# Patient Record
Sex: Female | Born: 1945
Health system: Southern US, Community
[De-identification: ages and names within clinical notes are randomized; demographics above are authoritative.]

## PROBLEM LIST (undated history)

## (undated) DIAGNOSIS — M719 Bursopathy, unspecified: Secondary | ICD-10-CM

## (undated) DIAGNOSIS — E119 Type 2 diabetes mellitus without complications: Secondary | ICD-10-CM

## (undated) DIAGNOSIS — D126 Benign neoplasm of colon, unspecified: Secondary | ICD-10-CM

## (undated) DIAGNOSIS — T4145XA Adverse effect of unspecified anesthetic, initial encounter: Secondary | ICD-10-CM

## (undated) DIAGNOSIS — T8859XA Other complications of anesthesia, initial encounter: Secondary | ICD-10-CM

## (undated) DIAGNOSIS — K3184 Gastroparesis: Secondary | ICD-10-CM

## (undated) DIAGNOSIS — K219 Gastro-esophageal reflux disease without esophagitis: Secondary | ICD-10-CM

## (undated) DIAGNOSIS — K5790 Diverticulosis of intestine, part unspecified, without perforation or abscess without bleeding: Secondary | ICD-10-CM

## (undated) DIAGNOSIS — R739 Hyperglycemia, unspecified: Secondary | ICD-10-CM

## (undated) DIAGNOSIS — K469 Unspecified abdominal hernia without obstruction or gangrene: Secondary | ICD-10-CM

## (undated) DIAGNOSIS — M1990 Unspecified osteoarthritis, unspecified site: Secondary | ICD-10-CM

## (undated) DIAGNOSIS — R9431 Abnormal electrocardiogram [ECG] [EKG]: Secondary | ICD-10-CM

## (undated) DIAGNOSIS — K449 Diaphragmatic hernia without obstruction or gangrene: Secondary | ICD-10-CM

## (undated) DIAGNOSIS — Z9889 Other specified postprocedural states: Secondary | ICD-10-CM

## (undated) DIAGNOSIS — E785 Hyperlipidemia, unspecified: Secondary | ICD-10-CM

## (undated) HISTORY — DX: Unspecified abdominal hernia without obstruction or gangrene: K46.9

## (undated) HISTORY — DX: Gastroparesis: K31.84

## (undated) HISTORY — DX: Other specified postprocedural states: Z98.890

## (undated) HISTORY — DX: Unspecified osteoarthritis, unspecified site: M19.90

## (undated) HISTORY — DX: Diaphragmatic hernia without obstruction or gangrene: K44.9

## (undated) HISTORY — DX: Abnormal electrocardiogram (ECG) (EKG): R94.31

## (undated) HISTORY — DX: Type 2 diabetes mellitus without complications: E11.9

## (undated) HISTORY — DX: Hyperglycemia, unspecified: R73.9

## (undated) HISTORY — DX: Benign neoplasm of colon, unspecified: D12.6

## (undated) HISTORY — DX: Hyperlipidemia, unspecified: E78.5

## (undated) HISTORY — DX: Gastro-esophageal reflux disease without esophagitis: K21.9

## (undated) HISTORY — DX: Bursopathy, unspecified: M71.9

## (undated) HISTORY — DX: Diverticulosis of intestine, part unspecified, without perforation or abscess without bleeding: K57.90

## (undated) HISTORY — PX: OTHER SURGICAL HISTORY: SHX169

## (undated) HISTORY — PX: LEG SURGERY: SHX1003

## (undated) HISTORY — PX: TUBAL LIGATION: SHX77

## (undated) HISTORY — PX: TRIGGER FINGER RELEASE: SHX641

## (undated) HISTORY — PX: COLONOSCOPY W/ POLYPECTOMY: SHX1380

---

## 1997-11-17 HISTORY — PX: OTHER SURGICAL HISTORY: SHX169

## 1998-10-09 ENCOUNTER — Other Ambulatory Visit: Admission: RE | Admit: 1998-10-09 | Discharge: 1998-10-09 | Payer: Self-pay | Admitting: Obstetrics and Gynecology

## 1998-11-17 HISTORY — PX: HIATAL HERNIA REPAIR: SHX195

## 1999-05-13 ENCOUNTER — Encounter (INDEPENDENT_AMBULATORY_CARE_PROVIDER_SITE_OTHER): Payer: Self-pay | Admitting: Specialist

## 1999-05-13 ENCOUNTER — Other Ambulatory Visit: Admission: RE | Admit: 1999-05-13 | Discharge: 1999-05-13 | Payer: Self-pay | Admitting: Gastroenterology

## 1999-06-04 ENCOUNTER — Encounter: Payer: Self-pay | Admitting: Gastroenterology

## 1999-06-04 ENCOUNTER — Ambulatory Visit (HOSPITAL_COMMUNITY): Admission: RE | Admit: 1999-06-04 | Discharge: 1999-06-04 | Payer: Self-pay | Admitting: Gastroenterology

## 1999-07-17 ENCOUNTER — Inpatient Hospital Stay (HOSPITAL_COMMUNITY): Admission: EM | Admit: 1999-07-17 | Discharge: 1999-07-19 | Payer: Self-pay

## 1999-10-15 ENCOUNTER — Other Ambulatory Visit: Admission: RE | Admit: 1999-10-15 | Discharge: 1999-10-15 | Payer: Self-pay | Admitting: Obstetrics and Gynecology

## 2000-11-05 ENCOUNTER — Other Ambulatory Visit: Admission: RE | Admit: 2000-11-05 | Discharge: 2000-11-05 | Payer: Self-pay | Admitting: Obstetrics and Gynecology

## 2001-11-22 ENCOUNTER — Other Ambulatory Visit: Admission: RE | Admit: 2001-11-22 | Discharge: 2001-11-22 | Payer: Self-pay | Admitting: Obstetrics and Gynecology

## 2002-12-20 ENCOUNTER — Other Ambulatory Visit: Admission: RE | Admit: 2002-12-20 | Discharge: 2002-12-20 | Payer: Self-pay | Admitting: Obstetrics and Gynecology

## 2003-11-18 HISTORY — PX: CARDIAC CATHETERIZATION: SHX172

## 2004-01-26 ENCOUNTER — Other Ambulatory Visit: Admission: RE | Admit: 2004-01-26 | Discharge: 2004-01-26 | Payer: Self-pay | Admitting: Obstetrics and Gynecology

## 2004-03-05 ENCOUNTER — Observation Stay (HOSPITAL_COMMUNITY): Admission: EM | Admit: 2004-03-05 | Discharge: 2004-03-05 | Payer: Self-pay | Admitting: Emergency Medicine

## 2004-09-24 ENCOUNTER — Ambulatory Visit: Payer: Self-pay | Admitting: Internal Medicine

## 2004-11-05 ENCOUNTER — Ambulatory Visit: Payer: Self-pay | Admitting: Family Medicine

## 2005-01-07 ENCOUNTER — Ambulatory Visit: Payer: Self-pay | Admitting: Internal Medicine

## 2005-02-03 ENCOUNTER — Other Ambulatory Visit: Admission: RE | Admit: 2005-02-03 | Discharge: 2005-02-03 | Payer: Self-pay | Admitting: Addiction Medicine

## 2005-04-21 ENCOUNTER — Ambulatory Visit: Payer: Self-pay | Admitting: Internal Medicine

## 2005-04-29 ENCOUNTER — Ambulatory Visit: Payer: Self-pay | Admitting: Internal Medicine

## 2005-05-02 ENCOUNTER — Ambulatory Visit: Payer: Self-pay | Admitting: Internal Medicine

## 2005-05-21 ENCOUNTER — Ambulatory Visit: Payer: Self-pay | Admitting: Internal Medicine

## 2005-05-26 ENCOUNTER — Ambulatory Visit: Payer: Self-pay | Admitting: Internal Medicine

## 2005-05-27 ENCOUNTER — Ambulatory Visit: Payer: Self-pay | Admitting: Gastroenterology

## 2005-06-17 DIAGNOSIS — D126 Benign neoplasm of colon, unspecified: Secondary | ICD-10-CM

## 2005-06-17 HISTORY — DX: Benign neoplasm of colon, unspecified: D12.6

## 2005-07-08 ENCOUNTER — Encounter (INDEPENDENT_AMBULATORY_CARE_PROVIDER_SITE_OTHER): Payer: Self-pay | Admitting: *Deleted

## 2005-07-08 ENCOUNTER — Ambulatory Visit: Payer: Self-pay | Admitting: Gastroenterology

## 2005-09-05 ENCOUNTER — Ambulatory Visit: Payer: Self-pay | Admitting: Internal Medicine

## 2005-10-06 ENCOUNTER — Ambulatory Visit: Payer: Self-pay | Admitting: Internal Medicine

## 2006-03-25 ENCOUNTER — Other Ambulatory Visit: Admission: RE | Admit: 2006-03-25 | Discharge: 2006-03-25 | Payer: Self-pay | Admitting: Obstetrics and Gynecology

## 2006-04-22 ENCOUNTER — Ambulatory Visit: Payer: Self-pay | Admitting: Internal Medicine

## 2006-04-24 ENCOUNTER — Ambulatory Visit: Payer: Self-pay | Admitting: Internal Medicine

## 2006-09-22 ENCOUNTER — Ambulatory Visit: Payer: Self-pay | Admitting: Gastroenterology

## 2006-09-30 ENCOUNTER — Ambulatory Visit: Payer: Self-pay | Admitting: Internal Medicine

## 2006-10-16 ENCOUNTER — Ambulatory Visit: Payer: Self-pay | Admitting: Gastroenterology

## 2006-10-16 ENCOUNTER — Encounter (INDEPENDENT_AMBULATORY_CARE_PROVIDER_SITE_OTHER): Payer: Self-pay | Admitting: Specialist

## 2007-01-01 ENCOUNTER — Ambulatory Visit: Payer: Self-pay | Admitting: Internal Medicine

## 2007-05-05 ENCOUNTER — Encounter: Payer: Self-pay | Admitting: Internal Medicine

## 2007-05-06 ENCOUNTER — Other Ambulatory Visit: Admission: RE | Admit: 2007-05-06 | Discharge: 2007-05-06 | Payer: Self-pay | Admitting: Obstetrics and Gynecology

## 2007-05-07 DIAGNOSIS — E1169 Type 2 diabetes mellitus with other specified complication: Secondary | ICD-10-CM | POA: Insufficient documentation

## 2007-05-07 DIAGNOSIS — E785 Hyperlipidemia, unspecified: Secondary | ICD-10-CM

## 2007-05-12 ENCOUNTER — Ambulatory Visit: Payer: Self-pay | Admitting: Internal Medicine

## 2007-05-12 DIAGNOSIS — K219 Gastro-esophageal reflux disease without esophagitis: Secondary | ICD-10-CM

## 2007-05-12 DIAGNOSIS — R1319 Other dysphagia: Secondary | ICD-10-CM | POA: Insufficient documentation

## 2007-05-12 DIAGNOSIS — R1310 Dysphagia, unspecified: Secondary | ICD-10-CM

## 2007-05-12 DIAGNOSIS — R9431 Abnormal electrocardiogram [ECG] [EKG]: Secondary | ICD-10-CM | POA: Insufficient documentation

## 2007-05-13 ENCOUNTER — Ambulatory Visit: Payer: Self-pay | Admitting: Internal Medicine

## 2007-05-17 ENCOUNTER — Telehealth (INDEPENDENT_AMBULATORY_CARE_PROVIDER_SITE_OTHER): Payer: Self-pay | Admitting: *Deleted

## 2007-05-18 ENCOUNTER — Encounter (INDEPENDENT_AMBULATORY_CARE_PROVIDER_SITE_OTHER): Payer: Self-pay | Admitting: *Deleted

## 2007-05-18 ENCOUNTER — Encounter: Payer: Self-pay | Admitting: Internal Medicine

## 2007-05-18 LAB — CONVERTED CEMR LAB
ALT: 21 units/L (ref 0–35)
AST: 22 units/L (ref 0–37)
Albumin: 3.7 g/dL (ref 3.5–5.2)
Alkaline Phosphatase: 62 units/L (ref 39–117)
BUN: 18 mg/dL (ref 6–23)
Basophils Absolute: 0 10*3/uL (ref 0.0–0.1)
Basophils Relative: 0.2 % (ref 0.0–1.0)
Bilirubin, Direct: 0.1 mg/dL (ref 0.0–0.3)
CO2: 31 meq/L (ref 19–32)
Calcium: 9.2 mg/dL (ref 8.4–10.5)
Chloride: 108 meq/L (ref 96–112)
Cholesterol: 171 mg/dL (ref 0–200)
Creatinine, Ser: 0.9 mg/dL (ref 0.4–1.2)
Eosinophils Absolute: 0.2 10*3/uL (ref 0.0–0.6)
Eosinophils Relative: 2.4 % (ref 0.0–5.0)
GFR calc Af Amer: 82 mL/min
GFR calc non Af Amer: 67 mL/min
Glucose, Bld: 121 mg/dL — ABNORMAL HIGH (ref 70–99)
HCT: 40.7 % (ref 36.0–46.0)
HDL: 67.2 mg/dL (ref >39.0)
Hemoglobin: 13.8 g/dL (ref 12.0–15.0)
Hgb A1c MFr Bld: 6.5 % — ABNORMAL HIGH (ref 4.6–6.0)
LDL Cholesterol: 87 mg/dL (ref 0–99)
Lymphocytes Relative: 36.6 % (ref 12.0–46.0)
MCHC: 33.9 g/dL (ref 30.0–36.0)
MCV: 92.3 fL (ref 78.0–100.0)
Monocytes Absolute: 0.5 10*3/uL (ref 0.2–0.7)
Monocytes Relative: 7.5 % (ref 3.0–11.0)
Neutro Abs: 3.5 10*3/uL (ref 1.4–7.7)
Neutrophils Relative %: 53.3 % (ref 43.0–77.0)
Platelets: 167 10*3/uL (ref 150–400)
Potassium: 4.3 meq/L (ref 3.5–5.1)
RBC: 4.41 M/uL (ref 3.87–5.11)
RDW: 12.3 % (ref 11.5–14.6)
Sodium: 144 meq/L (ref 135–145)
TSH: 1.13 microintl units/mL (ref 0.35–5.50)
Total Bilirubin: 0.5 mg/dL (ref 0.3–1.2)
Total CHOL/HDL Ratio: 2.5
Total Protein: 6.4 g/dL (ref 6.0–8.3)
Triglycerides: 85 mg/dL (ref 0–149)
VLDL: 17 mg/dL (ref 0–40)
WBC: 6.7 10*3/uL (ref 4.5–10.5)

## 2007-06-08 ENCOUNTER — Ambulatory Visit: Payer: Self-pay | Admitting: Cardiology

## 2007-06-15 ENCOUNTER — Ambulatory Visit: Payer: Self-pay

## 2007-06-15 ENCOUNTER — Encounter: Payer: Self-pay | Admitting: Internal Medicine

## 2007-06-24 ENCOUNTER — Telehealth: Payer: Self-pay | Admitting: Internal Medicine

## 2007-07-26 ENCOUNTER — Ambulatory Visit: Payer: Self-pay | Admitting: Gastroenterology

## 2007-07-26 ENCOUNTER — Ambulatory Visit: Payer: Self-pay | Admitting: Family Medicine

## 2007-07-26 ENCOUNTER — Encounter: Payer: Self-pay | Admitting: Internal Medicine

## 2007-07-27 ENCOUNTER — Ambulatory Visit: Payer: Self-pay | Admitting: Internal Medicine

## 2007-08-19 ENCOUNTER — Ambulatory Visit: Payer: Self-pay | Admitting: Internal Medicine

## 2007-08-19 DIAGNOSIS — N951 Menopausal and female climacteric states: Secondary | ICD-10-CM

## 2007-08-31 ENCOUNTER — Ambulatory Visit: Payer: Self-pay | Admitting: Gastroenterology

## 2007-08-31 ENCOUNTER — Encounter: Payer: Self-pay | Admitting: Internal Medicine

## 2007-09-07 ENCOUNTER — Ambulatory Visit (HOSPITAL_COMMUNITY): Admission: RE | Admit: 2007-09-07 | Discharge: 2007-09-07 | Payer: Self-pay | Admitting: Gastroenterology

## 2007-09-07 ENCOUNTER — Encounter: Payer: Self-pay | Admitting: Gastroenterology

## 2007-09-17 ENCOUNTER — Encounter: Payer: Self-pay | Admitting: Internal Medicine

## 2007-09-30 ENCOUNTER — Ambulatory Visit: Payer: Self-pay | Admitting: Internal Medicine

## 2007-09-30 ENCOUNTER — Telehealth (INDEPENDENT_AMBULATORY_CARE_PROVIDER_SITE_OTHER): Payer: Self-pay | Admitting: *Deleted

## 2007-10-01 ENCOUNTER — Encounter (INDEPENDENT_AMBULATORY_CARE_PROVIDER_SITE_OTHER): Payer: Self-pay | Admitting: *Deleted

## 2007-10-06 ENCOUNTER — Encounter (INDEPENDENT_AMBULATORY_CARE_PROVIDER_SITE_OTHER): Payer: Self-pay | Admitting: *Deleted

## 2007-10-06 LAB — CONVERTED CEMR LAB
Albumin: 4 g/dL (ref 3.5–5.2)
Bilirubin Urine: NEGATIVE
Glucose, Urine, Semiquant: NEGATIVE
Hgb A1c MFr Bld: 6.2 % — ABNORMAL HIGH (ref 4.6–6.0)
Ketones, urine, test strip: NEGATIVE
Nitrite: NEGATIVE
Protein, U semiquant: NEGATIVE
Specific Gravity, Urine: 1.02
Urobilinogen, UA: NEGATIVE
pH: 6

## 2007-10-21 ENCOUNTER — Ambulatory Visit: Payer: Self-pay | Admitting: Internal Medicine

## 2007-10-22 ENCOUNTER — Encounter: Payer: Self-pay | Admitting: Internal Medicine

## 2007-10-25 ENCOUNTER — Encounter (INDEPENDENT_AMBULATORY_CARE_PROVIDER_SITE_OTHER): Payer: Self-pay | Admitting: *Deleted

## 2007-10-27 ENCOUNTER — Ambulatory Visit: Payer: Self-pay | Admitting: Gastroenterology

## 2007-11-15 DIAGNOSIS — K3184 Gastroparesis: Secondary | ICD-10-CM

## 2007-11-15 DIAGNOSIS — K5730 Diverticulosis of large intestine without perforation or abscess without bleeding: Secondary | ICD-10-CM | POA: Insufficient documentation

## 2007-11-30 ENCOUNTER — Ambulatory Visit: Payer: Self-pay | Admitting: Internal Medicine

## 2007-12-14 ENCOUNTER — Ambulatory Visit: Payer: Self-pay | Admitting: Internal Medicine

## 2007-12-15 ENCOUNTER — Ambulatory Visit: Payer: Self-pay | Admitting: Internal Medicine

## 2007-12-15 ENCOUNTER — Telehealth: Payer: Self-pay | Admitting: Internal Medicine

## 2007-12-22 ENCOUNTER — Encounter (INDEPENDENT_AMBULATORY_CARE_PROVIDER_SITE_OTHER): Payer: Self-pay | Admitting: *Deleted

## 2007-12-22 ENCOUNTER — Ambulatory Visit: Payer: Self-pay | Admitting: Internal Medicine

## 2008-02-22 ENCOUNTER — Telehealth (INDEPENDENT_AMBULATORY_CARE_PROVIDER_SITE_OTHER): Payer: Self-pay | Admitting: *Deleted

## 2008-05-09 DIAGNOSIS — Z86010 Personal history of colonic polyps: Secondary | ICD-10-CM

## 2008-05-10 ENCOUNTER — Ambulatory Visit: Payer: Self-pay | Admitting: Gastroenterology

## 2008-06-20 ENCOUNTER — Telehealth: Payer: Self-pay | Admitting: Internal Medicine

## 2008-07-18 ENCOUNTER — Telehealth: Payer: Self-pay | Admitting: Gastroenterology

## 2008-09-05 ENCOUNTER — Ambulatory Visit: Payer: Self-pay | Admitting: Internal Medicine

## 2008-09-06 ENCOUNTER — Ambulatory Visit: Payer: Self-pay | Admitting: Internal Medicine

## 2008-09-06 ENCOUNTER — Telehealth (INDEPENDENT_AMBULATORY_CARE_PROVIDER_SITE_OTHER): Payer: Self-pay | Admitting: *Deleted

## 2008-09-09 LAB — CONVERTED CEMR LAB
Alkaline Phosphatase: 55 units/L (ref 39–117)
Bilirubin, Direct: 0.2 mg/dL (ref 0.0–0.3)
GFR calc Af Amer: 109 mL/min
Glucose, Bld: 107 mg/dL — ABNORMAL HIGH (ref 70–99)
HCT: 39.4 % (ref 36.0–46.0)
HDL: 65.5 mg/dL (ref >39.0)
Hgb A1c MFr Bld: 6.2 % — ABNORMAL HIGH (ref 4.6–6.0)
LDL Cholesterol: 108 mg/dL — ABNORMAL HIGH (ref 0–99)
Lymphocytes Relative: 35.5 % (ref 12.0–46.0)
Microalb Creat Ratio: 4.4 mg/g (ref 0.0–30.0)
Monocytes Absolute: 0.5 10*3/uL (ref 0.1–1.0)
Monocytes Relative: 7.2 % (ref 3.0–12.0)
Platelets: 178 10*3/uL (ref 150–400)
Potassium: 3.8 meq/L (ref 3.5–5.1)
RDW: 12.7 % (ref 11.5–14.6)
Sodium: 143 meq/L (ref 135–145)
TSH: 1.06 microintl units/mL (ref 0.35–5.50)
Total Bilirubin: 0.7 mg/dL (ref 0.3–1.2)
Total CHOL/HDL Ratio: 2.9
Total Protein: 6.3 g/dL (ref 6.0–8.3)
Triglycerides: 93 mg/dL (ref 0–149)

## 2008-09-12 ENCOUNTER — Other Ambulatory Visit: Admission: RE | Admit: 2008-09-12 | Discharge: 2008-09-12 | Payer: Self-pay | Admitting: Obstetrics and Gynecology

## 2008-09-12 ENCOUNTER — Encounter: Payer: Self-pay | Admitting: Obstetrics and Gynecology

## 2008-09-12 ENCOUNTER — Encounter (INDEPENDENT_AMBULATORY_CARE_PROVIDER_SITE_OTHER): Payer: Self-pay | Admitting: *Deleted

## 2008-09-12 ENCOUNTER — Ambulatory Visit: Payer: Self-pay | Admitting: Obstetrics and Gynecology

## 2008-09-25 ENCOUNTER — Encounter: Payer: Self-pay | Admitting: Internal Medicine

## 2008-10-06 ENCOUNTER — Ambulatory Visit: Payer: Self-pay | Admitting: Gastroenterology

## 2008-10-20 ENCOUNTER — Encounter: Payer: Self-pay | Admitting: Gastroenterology

## 2008-10-20 ENCOUNTER — Ambulatory Visit: Payer: Self-pay | Admitting: Gastroenterology

## 2008-10-20 LAB — HM COLONOSCOPY

## 2008-10-23 ENCOUNTER — Encounter: Payer: Self-pay | Admitting: Gastroenterology

## 2008-12-04 ENCOUNTER — Ambulatory Visit: Payer: Self-pay | Admitting: Internal Medicine

## 2008-12-26 ENCOUNTER — Telehealth (INDEPENDENT_AMBULATORY_CARE_PROVIDER_SITE_OTHER): Payer: Self-pay | Admitting: *Deleted

## 2008-12-26 ENCOUNTER — Encounter: Payer: Self-pay | Admitting: Internal Medicine

## 2009-03-02 ENCOUNTER — Ambulatory Visit: Payer: Self-pay | Admitting: Internal Medicine

## 2009-04-30 ENCOUNTER — Ambulatory Visit: Payer: Self-pay | Admitting: Gastroenterology

## 2009-07-03 ENCOUNTER — Telehealth (INDEPENDENT_AMBULATORY_CARE_PROVIDER_SITE_OTHER): Payer: Self-pay | Admitting: *Deleted

## 2009-07-03 ENCOUNTER — Ambulatory Visit: Payer: Self-pay | Admitting: Internal Medicine

## 2009-07-10 ENCOUNTER — Telehealth (INDEPENDENT_AMBULATORY_CARE_PROVIDER_SITE_OTHER): Payer: Self-pay | Admitting: *Deleted

## 2009-07-10 ENCOUNTER — Ambulatory Visit: Payer: Self-pay | Admitting: Internal Medicine

## 2009-07-10 LAB — CONVERTED CEMR LAB: TSH: 0.06 microintl units/mL — ABNORMAL LOW (ref 0.35–5.50)

## 2009-07-13 ENCOUNTER — Ambulatory Visit: Payer: Self-pay | Admitting: Internal Medicine

## 2009-07-13 DIAGNOSIS — R946 Abnormal results of thyroid function studies: Secondary | ICD-10-CM

## 2009-07-16 ENCOUNTER — Telehealth: Payer: Self-pay | Admitting: Internal Medicine

## 2009-08-01 ENCOUNTER — Telehealth (INDEPENDENT_AMBULATORY_CARE_PROVIDER_SITE_OTHER): Payer: Self-pay | Admitting: *Deleted

## 2009-08-01 ENCOUNTER — Ambulatory Visit: Payer: Self-pay | Admitting: Internal Medicine

## 2009-08-06 ENCOUNTER — Telehealth: Payer: Self-pay | Admitting: Internal Medicine

## 2009-08-20 ENCOUNTER — Encounter: Payer: Self-pay | Admitting: Internal Medicine

## 2009-08-27 ENCOUNTER — Ambulatory Visit: Payer: Self-pay | Admitting: Internal Medicine

## 2009-08-27 DIAGNOSIS — H8110 Benign paroxysmal vertigo, unspecified ear: Secondary | ICD-10-CM | POA: Insufficient documentation

## 2009-09-06 ENCOUNTER — Encounter: Admission: RE | Admit: 2009-09-06 | Discharge: 2009-10-17 | Payer: Self-pay | Admitting: Internal Medicine

## 2009-09-06 ENCOUNTER — Telehealth (INDEPENDENT_AMBULATORY_CARE_PROVIDER_SITE_OTHER): Payer: Self-pay | Admitting: *Deleted

## 2009-09-07 ENCOUNTER — Telehealth (INDEPENDENT_AMBULATORY_CARE_PROVIDER_SITE_OTHER): Payer: Self-pay | Admitting: *Deleted

## 2009-09-07 ENCOUNTER — Ambulatory Visit: Payer: Self-pay | Admitting: Cardiology

## 2009-09-10 ENCOUNTER — Telehealth (INDEPENDENT_AMBULATORY_CARE_PROVIDER_SITE_OTHER): Payer: Self-pay | Admitting: *Deleted

## 2009-09-13 ENCOUNTER — Encounter: Payer: Self-pay | Admitting: Internal Medicine

## 2009-09-17 ENCOUNTER — Encounter: Payer: Self-pay | Admitting: Internal Medicine

## 2009-09-18 ENCOUNTER — Encounter: Payer: Self-pay | Admitting: Internal Medicine

## 2009-09-20 ENCOUNTER — Encounter: Payer: Self-pay | Admitting: Internal Medicine

## 2009-10-04 ENCOUNTER — Ambulatory Visit: Payer: Self-pay | Admitting: Internal Medicine

## 2009-10-08 ENCOUNTER — Ambulatory Visit: Payer: Self-pay | Admitting: Internal Medicine

## 2009-10-08 ENCOUNTER — Ambulatory Visit: Payer: Self-pay | Admitting: Family Medicine

## 2009-10-10 ENCOUNTER — Encounter: Payer: Self-pay | Admitting: Internal Medicine

## 2009-10-16 ENCOUNTER — Ambulatory Visit: Payer: Self-pay | Admitting: Internal Medicine

## 2009-10-16 DIAGNOSIS — M6740 Ganglion, unspecified site: Secondary | ICD-10-CM | POA: Insufficient documentation

## 2009-10-16 DIAGNOSIS — R7309 Other abnormal glucose: Secondary | ICD-10-CM

## 2009-10-17 ENCOUNTER — Telehealth (INDEPENDENT_AMBULATORY_CARE_PROVIDER_SITE_OTHER): Payer: Self-pay | Admitting: *Deleted

## 2009-10-17 ENCOUNTER — Ambulatory Visit: Payer: Self-pay | Admitting: Obstetrics and Gynecology

## 2009-10-17 ENCOUNTER — Other Ambulatory Visit: Admission: RE | Admit: 2009-10-17 | Discharge: 2009-10-17 | Payer: Self-pay | Admitting: Obstetrics and Gynecology

## 2009-10-22 ENCOUNTER — Encounter: Payer: Self-pay | Admitting: Internal Medicine

## 2009-10-26 ENCOUNTER — Encounter: Payer: Self-pay | Admitting: Internal Medicine

## 2009-11-02 ENCOUNTER — Encounter (INDEPENDENT_AMBULATORY_CARE_PROVIDER_SITE_OTHER): Payer: Self-pay | Admitting: *Deleted

## 2009-11-07 ENCOUNTER — Telehealth: Payer: Self-pay | Admitting: Gastroenterology

## 2009-12-24 ENCOUNTER — Telehealth (INDEPENDENT_AMBULATORY_CARE_PROVIDER_SITE_OTHER): Payer: Self-pay | Admitting: *Deleted

## 2010-03-06 ENCOUNTER — Telehealth: Payer: Self-pay | Admitting: Gastroenterology

## 2010-03-18 ENCOUNTER — Ambulatory Visit: Payer: Self-pay | Admitting: Family Medicine

## 2010-03-18 LAB — CONVERTED CEMR LAB
Glucose, Urine, Semiquant: NEGATIVE
Nitrite: NEGATIVE
Protein, U semiquant: NEGATIVE
Specific Gravity, Urine: 1.015
Urobilinogen, UA: 0.2

## 2010-03-19 ENCOUNTER — Ambulatory Visit: Payer: Self-pay | Admitting: Family Medicine

## 2010-03-19 LAB — CONVERTED CEMR LAB
Basophils Absolute: 0 10*3/uL (ref 0.0–0.1)
Basophils Relative: 0.3 % (ref 0.0–3.0)
Crystals: NONE SEEN
Eosinophils Relative: 0.1 % (ref 0.0–5.0)
HCT: 37.4 % (ref 36.0–46.0)
Hemoglobin: 12.6 g/dL (ref 12.0–15.0)
Lymphocytes Relative: 22.3 % (ref 12.0–46.0)
Lymphs Abs: 1.8 10*3/uL (ref 0.7–4.0)
Monocytes Relative: 6 % (ref 3.0–12.0)
Neutro Abs: 5.8 10*3/uL (ref 1.4–7.7)
RBC: 3.89 M/uL (ref 3.87–5.11)
RDW: 13 % (ref 11.5–14.6)
WBC: 8.2 10*3/uL (ref 4.5–10.5)

## 2010-05-06 ENCOUNTER — Ambulatory Visit: Payer: Self-pay | Admitting: Gastroenterology

## 2010-05-06 DIAGNOSIS — E0590 Thyrotoxicosis, unspecified without thyrotoxic crisis or storm: Secondary | ICD-10-CM | POA: Insufficient documentation

## 2010-09-17 ENCOUNTER — Ambulatory Visit: Payer: Self-pay | Admitting: Internal Medicine

## 2010-10-14 ENCOUNTER — Telehealth: Payer: Self-pay | Admitting: Gastroenterology

## 2010-10-17 ENCOUNTER — Ambulatory Visit: Payer: Self-pay | Admitting: Internal Medicine

## 2010-10-21 ENCOUNTER — Ambulatory Visit: Payer: Self-pay | Admitting: Internal Medicine

## 2010-10-21 ENCOUNTER — Encounter: Payer: Self-pay | Admitting: Internal Medicine

## 2010-10-22 ENCOUNTER — Ambulatory Visit: Payer: Self-pay | Admitting: Obstetrics and Gynecology

## 2010-10-22 ENCOUNTER — Other Ambulatory Visit
Admission: RE | Admit: 2010-10-22 | Discharge: 2010-10-22 | Payer: Self-pay | Source: Home / Self Care | Admitting: Obstetrics and Gynecology

## 2010-10-22 LAB — CONVERTED CEMR LAB
ALT: 27 units/L (ref 0–35)
Albumin: 3.8 g/dL (ref 3.5–5.2)
Alkaline Phosphatase: 47 units/L (ref 39–117)
Basophils Relative: 0.2 % (ref 0.0–3.0)
Bilirubin, Direct: 0.1 mg/dL (ref 0.0–0.3)
CO2: 29 meq/L (ref 19–32)
Calcium: 8.8 mg/dL (ref 8.4–10.5)
Chloride: 103 meq/L (ref 96–112)
Creatinine, Ser: 0.8 mg/dL (ref 0.4–1.2)
Glucose, Bld: 111 mg/dL — ABNORMAL HIGH (ref 70–99)
HCT: 38.7 % (ref 36.0–46.0)
Hemoglobin: 13.2 g/dL (ref 12.0–15.0)
Lymphocytes Relative: 36.7 % (ref 12.0–46.0)
Lymphs Abs: 2.1 10*3/uL (ref 0.7–4.0)
Monocytes Relative: 6.8 % (ref 3.0–12.0)
Neutro Abs: 3.1 10*3/uL (ref 1.4–7.7)
RBC: 4 M/uL (ref 3.87–5.11)
TSH: 0.71 microintl units/mL (ref 0.35–5.50)
Total CHOL/HDL Ratio: 3
Total Protein: 6.1 g/dL (ref 6.0–8.3)
Triglycerides: 75 mg/dL (ref 0.0–149.0)

## 2010-10-28 ENCOUNTER — Encounter: Payer: Self-pay | Admitting: Internal Medicine

## 2010-12-15 LAB — CONVERTED CEMR LAB
ALT: 25 units/L (ref 0–35)
AST: 25 units/L (ref 0–37)
BUN: 14 mg/dL (ref 6–23)
Basophils Absolute: 0 10*3/uL (ref 0.0–0.1)
Basophils Relative: 0.5 % (ref 0.0–3.0)
Bilirubin, Direct: 0.1 mg/dL (ref 0.0–0.3)
CO2: 30 meq/L (ref 19–32)
Calcium: 9 mg/dL (ref 8.4–10.5)
Calcium: 9.2 mg/dL (ref 8.4–10.5)
Eosinophils Relative: 1.7 % (ref 0.0–5.0)
Free T4: 1.1 ng/dL (ref 0.6–1.6)
GFR calc non Af Amer: 76.54 mL/min (ref >60)
Glucose, Bld: 116 mg/dL — ABNORMAL HIGH (ref 70–99)
HCT: 38.1 % (ref 36.0–46.0)
HCT: 40.6 % (ref 36.0–46.0)
Lymphs Abs: 2.6 10*3/uL (ref 0.7–4.0)
MCHC: 33.9 g/dL (ref 30.0–36.0)
MCV: 95.1 fL (ref 78.0–100.0)
MCV: 95.8 fL (ref 78.0–100.0)
Monocytes Absolute: 0.2 10*3/uL (ref 0.1–1.0)
Monocytes Absolute: 0.4 10*3/uL (ref 0.1–1.0)
Monocytes Relative: 6.8 % (ref 3.0–12.0)
Neutro Abs: 4.6 10*3/uL (ref 1.4–7.7)
Neutrophils Relative %: 52.4 % (ref 43.0–77.0)
Platelets: 189 10*3/uL (ref 150.0–400.0)
RBC: 4.27 M/uL (ref 3.87–5.11)
RDW: 11.9 % (ref 11.5–14.6)
TSH: 0.05 microintl units/mL — ABNORMAL LOW (ref 0.35–5.50)
Total Bilirubin: 0.5 mg/dL (ref 0.3–1.2)
Total CHOL/HDL Ratio: 3
Total CK: 90 units/L (ref 7–177)
Total Protein: 6.7 g/dL (ref 6.0–8.3)
VLDL: 21 mg/dL (ref 0.0–40.0)
WBC: 6.5 10*3/uL (ref 4.5–10.5)

## 2010-12-17 NOTE — Assessment & Plan Note (Signed)
Summary: FOR A BAD COUGH//PH   Vital Signs:  Patient profile:   65 year old female Height:      63 inches (160.02 cm) Weight:      184.25 pounds (83.75 kg) BMI:     32.76 O2 Sat:      93 % on Room air Temp:     98.3 degrees F (36.83 degrees C) oral Pulse rate:   72 / minute Resp:     15 per minute BP sitting:   120 / 76  (left arm)  Vitals Entered By: Lucious Groves CMA (September 17, 2010 12:13 PM)  O2 Flow:  Room air CC: C/O severe cough./kb, URI symptoms Is Patient Diabetic? No Comments Patient notes that it began as congestion one week ago and has progressively gotten worse. She has been having fever, HA, sinus pressure, congestion, and sinus pain. Patient denies mucous production. She states that is have become a "rattle in her chest"./kb   Primary Care Provider:  Marga Melnick, MD  CC:  C/O severe cough./kb and URI symptoms.  History of Present Illness:  RTI  Symptoms      This is a 65 year old woman who presents with RTI  symptoms X 5 days , onset as rhinitis .  The patient reports purulent nasal discharge and productive cough, but denies sore throat and earache.  Associated symptoms include low-grade fever (<100.5 degrees) and  slight wheezing ( "gurgles"). No PMH of asthma.  The patient denies dyspnea.  The patient denies headache.  Risk factors for Strep sinusitis include bilateral nasal discharge and tooth pain.  The patient denies the following risk factors for Strep sinusitis: tender adenopathy.  Rx: Neti pot, Tylenol Cold  Current Medications (verified): 1)  Lipitor 40 Mg  Tabs (Atorvastatin Calcium) .Marland Kitchen.. 1 By Mouth Qd 2)  Allegra 180 Mg  Tabs (Fexofenadine Hcl) .Marland Kitchen.. 1 By Mouth Qd 3)  Nexium 40 Mg  Cpdr (Esomeprazole Magnesium) .Marland Kitchen.. 1 By Mouth Qd 4)  Viactive .... Take 2 Tablet By Mouth Once A Day 5)  Tylenol Arthritis Pain 650 Mg Cr-Tabs (Acetaminophen) .... Take Two By Mouth Two Times A Day 6)  Domperidone 10 Mg .... Take One Tablet By Mouth Three Times A Day Before  Meals 7)  Lifespan Ultra 2 Test Strips and Lancets .... Use As Directed 8)  Ester-C   Tabs (Bioflavonoid Products) .... Take 1 Tablet By Mouth Once A Day 9)  Levsin 0.125 Mg Tabs (Hyoscyamine Sulfate) .Marland Kitchen.. 1-2 By Mouth Q 4-6 Hours As Needed Abdominal Pain and Cramping  Allergies (verified): 1)  ! Codeine 2)  ! E.e.s. 400 3)  ! Oxycodone Hcl (Oxycodone Hcl) 4)  ! Nsaids 5)  ! * Codone-Derivatives  Review of Systems Allergy:  Complains of itching eyes and sneezing.  Physical Exam  General:  in no acute distress; alert,appropriate and cooperative throughout examination Ears:  External ear exam shows no significant lesions or deformities.  Otoscopic examination reveals clear canals, tympanic membranes are intact bilaterally without bulging, retraction, inflammation or discharge. Hearing is grossly normal bilaterally. Nose:  External nasal examination shows no deformity or inflammation. Nasal mucosa are pink and moist without lesions or exudates. Hyponasal speech Mouth:  Oral mucosa and oropharynx without lesions or exudates.  Teeth in good repair. Lungs:  Normal respiratory effort, chest expands symmetrically. Lungs : mild crackes @ bases. Brassy cough Heart:  Normal rate and regular rhythm. S1 and S2 normal without gallop, murmur, click, rub or other extra sounds.  Cervical Nodes:  No lymphadenopathy noted Axillary Nodes:  No palpable lymphadenopathy   Impression & Recommendations:  Problem # 1:  URI (ICD-465.9)  Her updated medication list for this problem includes:    Allegra 180 Mg Tabs (Fexofenadine hcl) .Marland Kitchen... 1 by mouth qd    Tylenol Arthritis Pain 650 Mg Cr-tabs (Acetaminophen) .Marland Kitchen... Take two by mouth two times a day  Problem # 2:  BRONCHITIS-ACUTE (ICD-466.0)  Her updated medication list for this problem includes:    Amoxicillin-pot Clavulanate 875-125 Mg Tabs (Amoxicillin-pot clavulanate) .Marland Kitchen... 1 every 12 hrs with a meal    Singulair 10 Mg Tabs (Montelukast sodium) .Marland Kitchen... 1  once daily  Complete Medication List: 1)  Lipitor 40 Mg Tabs (Atorvastatin calcium) .Marland Kitchen.. 1 by mouth qd 2)  Allegra 180 Mg Tabs (Fexofenadine hcl) .Marland Kitchen.. 1 by mouth qd 3)  Nexium 40 Mg Cpdr (Esomeprazole magnesium) .Marland Kitchen.. 1 by mouth qd 4)  Viactive  .... Take 2 tablet by mouth once a day 5)  Tylenol Arthritis Pain 650 Mg Cr-tabs (Acetaminophen) .... Take two by mouth two times a day 6)  Domperidone 10 Mg  .... Take one tablet by mouth three times a day before meals 7)  Lifespan Ultra 2 Test Strips and Lancets  .... Use as directed 8)  Ester-c Tabs (Bioflavonoid products) .... Take 1 tablet by mouth once a day 9)  Levsin 0.125 Mg Tabs (Hyoscyamine sulfate) .Marland Kitchen.. 1-2 by mouth q 4-6 hours as needed abdominal pain and cramping 10)  Amoxicillin-pot Clavulanate 875-125 Mg Tabs (Amoxicillin-pot clavulanate) .Marland Kitchen.. 1 every 12 hrs with a meal 11)  Singulair 10 Mg Tabs (Montelukast sodium) .Marland Kitchen.. 1 once daily  Patient Instructions: 1)  Neti pot once daily until sinuses are clear .  2)  Drink as much NON dairy  fluid as you can tolerate for the next few days. Prescriptions: SINGULAIR 10 MG TABS (MONTELUKAST SODIUM) 1 once daily  #30 x 5   Entered and Authorized by:   Marga Melnick MD   Signed by:   Marga Melnick MD on 09/17/2010   Method used:   Print then Give to Patient   RxID:   [id number] AMOXICILLIN-POT CLAVULANATE 875-125 MG TABS (AMOXICILLIN-POT CLAVULANATE) 1 every 12 hrs with a meal  #20 x 0   Entered and Authorized by:   Marga Melnick MD   Signed by:   Marga Melnick MD on 09/17/2010   Method used:   [facility]ed to ...       [facility] [address] [phone]* (retail)       [address]       [place], [place]  [address]       Ph: [phone] or [phone]       Fax: [phone]   RxID:   [id number]    Orders Added: 1)  Est. Patient Level III [02542]

## 2010-12-17 NOTE — Assessment & Plan Note (Signed)
Summary: esophageal spasms/sheri   History of Present Illness Visit Type: Follow-up Visit Primary GI MD: Elie Goody MD [facility] Primary Provider: Marga Melnick, MD Chief Complaint: Patient here to follow up on her gastroparesis and esophageal spasms, which have resolved since she made the appt. She needs refill of Remus Loffler and Domperidone.  History of Present Illness:   Hannah Bryant returns for followup of GERD and gastroparesis. She states she had a several-day flare of reflux with regurgitation in April while vacationing in [place]. She noted a cramping sensation in her chest for 3 days as well. The symptoms resolved after 4-5 days, and she has felt well for the past 2 months.   GI Review of Systems      Denies abdominal pain, acid reflux, belching, bloating, chest pain, dysphagia with liquids, dysphagia with solids, heartburn, loss of appetite, nausea, vomiting, vomiting blood, weight loss, and  weight gain.        Denies anal fissure, black tarry stools, change in bowel habit, constipation, diarrhea, diverticulosis, fecal incontinence, heme positive stool, hemorrhoids, irritable bowel syndrome, jaundice, light color stool, liver problems, rectal bleeding, and  rectal pain.   Current Medications (verified): 1)  Lipitor 40 Mg  Tabs (Atorvastatin Calcium) .Marland Kitchen.. 1 By Mouth Qd 2)  Allegra 180 Mg  Tabs (Fexofenadine Hcl) .Marland Kitchen.. 1 By Mouth Qd 3)  Nexium 40 Mg  Cpdr (Esomeprazole Magnesium) .Marland Kitchen.. 1 By Mouth Qd 4)  Viactive .... Take 2 Tablet By Mouth Once A Day 5)  Tylenol Arthritis Pain 650 Mg Cr-Tabs (Acetaminophen) .... Take Two By Mouth Two Times A Day 6)  Flax Seed Oil 1000 Mg Caps (Flaxseed (Linseed)) .... Sprinkled On Oatmeal in The Morning 7)  Domperidone 10 Mg .... Take One Tablet By Mouth Three Times A Day Before Meals 8)  Lifespan Ultra 2 Test Strips and Lancets .... Use As Directed 9)  Ester-C   Tabs (Bioflavonoid Products) .... Take 1 Tablet By Mouth Once A Day 10)  Levsin  0.125 Mg Tabs (Hyoscyamine Sulfate) .Marland Kitchen.. 1-2 By Mouth Q 4-6 Hours As Needed Abdominal Pain and Cramping  Allergies (verified): 1)  ! Codeine 2)  ! E.e.s. 400 3)  ! Oxycodone Hcl (Oxycodone Hcl) 4)  ! Nsaids 5)  ! * Codone-Derivatives  Past History:  Past Medical History: Hyperlipidemia GERD Hiatal hernia Gastroparesis, 93% retention at 2 hours Tubulovillous Adenomatous Colon Polyps, 06/2005 Arthritis Diverticulosis Tronchanteric bursitis ; Hyperglycemia (790.29); BPV   Past Surgical History: Reviewed history from 10/16/2009 and no changes required. Nissen fundoplication and hiatal hernia repair, 2000 Trauma LLE : fractured fibula & dislocation of ankle,plate & screws by  Dr Moshe Cipro ,[place] 2009; screw removed by  Dr Beverely Low 2009 ; Cath 2005 < 30 % lesions  Colon polypectomy X2; neg colonoscopy 2009 , Dr Russella Dar, due 2014; G3 P2 Tubal ligation  Family History: Reviewed history from 10/16/2009 and no changes required. Father: ulcers,DM,HTN,aortic bypass, MI @ ? age Mother: CAD, MI @ 34 Siblings: sister DM, CABG @ 35 No FH of Colon Cancer:  Social History: Reviewed history from 10/16/2009 and no changes required. Former Smoker: quit   1982 Alcohol use-yes: socially Daily Caffeine Use 1-2 cups decaf  coffee Married No specific diet (seeing Durwin Nora)  Review of Systems       The patient complains of allergy/sinus, arthritis/joint pain, and muscle pains/cramps.         The pertinent positives and negatives are noted as above and in the HPI. All other  ROS were reviewed and were negative.  Vital Signs:  Patient profile:   65 year old female Height:      63 inches Weight:      179.0 pounds BMI:     31.82 Pulse rate:   60 / minute Pulse rhythm:   regular BP sitting:   122 / 74  (left arm) Cuff size:   regular  Vitals Entered By: Harlow Mares CMA Duncan Dull) (May 06, 2010 10:47 AM)  Physical Exam  General:  Well developed, well nourished, no acute  distress. Head:  Normocephalic and atraumatic. Eyes:  PERRLA, no icterus. Mouth:  No deformity or lesions, dentition normal. Lungs:  Clear throughout to auscultation. Heart:  Regular rate and rhythm; no murmurs, rubs,  or bruits. Abdomen:  Soft, nontender and nondistended. No masses, hepatosplenomegaly or hernias noted. Normal bowel sounds. Psych:  Alert and cooperative. Normal mood and affect.  Impression & Recommendations:  Problem # 1:  GASTROPARESIS (ICD-536.3) Symptoms currently under good control. Continue standard dietary measures. Continue domperidone.  Problem # 2:  GASTROESOPHAGEAL REFLUX DISEASE, SEVERE (ICD-530.81) Continue Nexium 40 mg q.a.m. and standard antireflux measures.  Problem # 3:  TUBULOVILLOUS ADENOMA, COLON, HX OF (ICD-V12.72) Recall colonoscopy recommended in December 2014.  Patient Instructions: 1)  Pick up your prescription from your pharmacy. 2)  Please continue current medications.  3)  Please schedule a follow-up appointment in 1 year. 4)  Copy sent to : Marga Melnick, MD 5)  The medication list was reviewed and reconciled.  All changed / newly prescribed medications were explained.  A complete medication list was provided to the patient / caregiver.  Prescriptions: LEVSIN 0.125 MG TABS (HYOSCYAMINE SULFATE) 1-2 by mouth q 4-6 hours as needed abdominal pain and cramping  #60 x 11   Entered by:   Christie Nottingham CMA (AAMA)   Authorized by:   Meryl Dare MD [facility]   Signed by:   Christie Nottingham CMA (AAMA) on 05/06/2010   Method used:   [facility]ron[facility] to        [facility] [address] [phone]* (retail)       [address]       [place], [place]  [address]       Ph: [phone] or [phone]       Fax: [phone]   RxID:   [id number] DOMPERIDONE 10 MG take one tablet by mouth three times a day before meals  #100 x 11   Entered by:   Christie Nottingham CMA (AAMA)   Authorized by:   Meryl Dare MD [facility]   Signed by:    Christie Nottingham CMA (AAMA) on 05/06/2010   Method used:   Print then Give to Patient   RxID:   [id number]

## 2010-12-17 NOTE — Progress Notes (Signed)
Summary: Diarhea   Phone Note Call from Patient Call back at Home Phone [phone]   Call For: Dr Russella Dar Summary of Call: Diarrhea started last night. It's clear no color like she drank prep for a Colon but she did not. Cant get it to stop. Initial call taken by: Vallarie Mare,  October 14, 2010 10:03 AM  Follow-up for Phone Call        Patient with 2 day hx of watery diarrhea.  Denies fever, nausea, vomiting, abdominal pain, or rectal bleeding.  She denies recent travel but has been treated this month with amoxicillin by Dr Alwyn Ren for URI.  She states imodium is not helping.  She is advised to come in and see Willette Cluster, RNP  today at 1:30.  She is advised to push fluids as she states she is feeling weak. Darcey Nora, RN CGRN Follow-up by: Graciella Freer RN,  October 14, 2010 10:16 AM

## 2010-12-17 NOTE — Assessment & Plan Note (Signed)
Summary: cpx//lch   Vital Signs:  Patient profile:   65 year old female Height:      63.5 inches Weight:      173.4 pounds BMI:     30.34 Temp:     98.1 degrees F oral Pulse rate:   72 / minute Resp:     14 per minute BP sitting:   130 / 86  (left arm) Cuff size:   large  Vitals Entered By: Shonna Chock CMA (October 17, 2010 11:05 AM) CC: CPX-not fasting, discuss if patient should continue singulair, Diarrhea, Lipid Management   Primary Care Provider:  Marga Melnick, MD  CC:  CPX-not fasting, discuss if patient should continue singulair, Diarrhea, and Lipid Management.  History of Present Illness:      Hannah Bryant is here for a physical; she is not using Medicare.  She had frank diarrhea over 15 hrs beginning 8:45 pm on 11/27.  The patient reported  > 15 stools per day, watery/unformed stools, and fasting diarrhea, but denied  blood in stool and mucus in stool.  Associated symptoms include nausea and vomiting X 1.  The patient denied  fever, abdominal pain, abdominal cramps, lightheadedness, joint pains, mouth ulcers, and eye redness. Immodium ? helped ; now just residual weakness due to poor oral  intake this week. Her husband is now having similar symptoms  which appear to be resolving as well.      Hyperlipidemia Follow-Up:  The patient denies muscle aches, flushing, and itching.  The patient denies the following symptoms: chest pain/pressure, exercise intolerance, dypsnea, palpitations, syncope, and pedal edema.  Compliance with medications (by patient report) has been near 100%.  Dietary compliance has been good.  The patient reports exercising 3-4X per week.  Adjunctive measures currently used by the patient include  ASA 81 mg .    Lipid Management History:      Positive NCEP/ATP III risk factors include female age 39 years old or older and family history for ischemic heart disease (females less than 38 years old & males less than 29 years old).  Negative NCEP/ATP III risk factors  include no history of early menopause without estrogen hormone replacement, non-diabetic, non-tobacco-user status, non-hypertensive, no ASHD (atherosclerotic heart disease), no prior stroke/TIA, no peripheral vascular disease, and no history of aortic aneurysm.     Allergies: 1)  ! Codeine 2)  ! E.e.s. 400 3)  ! Oxycodone Hcl (Oxycodone Hcl) 4)  ! Nsaids 5)  ! * Codone-Derivatives  Past History:  Past Medical History: Hyperlipidemia: Framingham Study LDL goal = < 130 GERD Hiatal hernia Gastroparesis, 93% retention at 2 hours Tubulovillous Adenomatous Colon Polyps, 06/2005 Arthritis Diverticulosis Tronchanteric bursitis ; Hyperglycemia (790.29); BPV ; Non specific  ST-T EKG changes  Past Surgical History: Nissen fundoplication and hiatal hernia repair, 2000 Trauma LLE : fractured fibula & dislocation of ankle,plate & screws by  Dr Moshe Cipro ,[place] 2009; screw removed by  Dr Beverely Low 2009 ; Cath 2005 < 30 % lesions , Dr Charlies Constable Colon polypectomy X2; negative  colonoscopy 2009 , Dr Russella Dar, due 2014; G3 P2 Tubal ligation  Family History: Father: ulcers,DM,HTN,aortic bypass, MI @ ? age Mother: CAD, MI @ 50 Siblings: sister DM,  MI  & CABG @ 4 No FH of Colon Cancer:  Social History: Former Smoker: quit   1982 Alcohol use-yes: socially Daily Caffeine Use 1-2 cups decaf  coffee Married No specific diet (seeing [employer] intermittently)  Review of Systems  The patient complains of severe indigestion/heartburn.  The patient denies vision loss, decreased hearing, hoarseness, prolonged cough, melena, hematochezia, hematuria, suspicious skin lesions, unusual weight change, abnormal bleeding, enlarged lymph nodes, and angioedema.         Weight down 7# with diarrhea. She describes recurrent burping for several months w/o bloating.   Physical Exam  General:  well-nourished,in no acute distress; alert,appropriate and cooperative throughout examination Head:   Normocephalic and atraumatic without obvious abnormalities. Eyes:  No corneal or conjunctival inflammation noted.  No icterus. Perrla. Funduscopic exam benign, without hemorrhages, exudates or papilledema. Ears:  External ear exam shows no significant lesions or deformities.  Otoscopic examination reveals clear canals, tympanic membranes are intact bilaterally without bulging, retraction, inflammation or discharge. Hearing is grossly normal bilaterally. Nose:  External nasal examination shows no deformity or inflammation. Nasal mucosa are pink and moist without lesions or exudates. Mouth:  Oral mucosa and oropharynx without lesions or exudates.  Teeth in good repair. No pharyngeal erythema.   Tongue moist  Neck:  No deformities, masses, or tenderness noted. Lungs:  Normal respiratory effort, chest expands symmetrically. Lungs are clear to auscultation, no crackles or wheezes. Heart:  normal rate, regular rhythm, no murmur, no gallop, no rub, no JVD, and no HJR.   Abdomen:  Bowel sounds positive,abdomen soft and NON -tender without masses, organomegaly or hernias noted. Genitalia:  Dr Eda Paschal Msk:  No deformity or scoliosis noted of thoracic or lumbar spine.   Pulses:  R and L carotid,radial,dorsalis pedis and posterior tibial pulses are full and equal bilaterally Extremities:  No clubbing, cyanosis, edema, or deformity noted with normal full range of motion of all joints.   Neurologic:  alert & oriented X3 and DTRs symmetrical and normal.   Skin:  Intact without suspicious lesions or rashes. No jaundice . Minimal tenting suggested Cervical Nodes:  No lymphadenopathy noted Axillary Nodes:  No palpable lymphadenopathy Psych:  memory intact for recent and remote, normally interactive, and good eye contact.     Impression & Recommendations:  Problem # 1:  ROUTINE GENERAL MEDICAL EXAM@HEALTH  CARE FACL (ICD-V70.0)  Orders: EKG w/ Interpretation (93000)  Problem # 2:  DIARRHEA  (ICD-787.91) S/P over 15 hrs  11/27-11/28  Problem # 3:  HYPERLIPIDEMIA (ICD-272.4)  Her updated medication list for this problem includes:    Lipitor 40 Mg Tabs (Atorvastatin calcium) .Marland Kitchen... 1 by mouth qd  Problem # 4:  ISCHEMIC HEART DISEASE, PREMATURE, FAMILY HX (ICD-V17.3)  Problem # 5:  FASTING HYPERGLYCEMIA (ICD-790.29)  Problem # 6:  GASTROESOPHAGEAL REFLUX DISEASE, SEVERE (ICD-530.81)  Her updated medication list for this problem includes:    Nexium 40 Mg Cpdr (Esomeprazole magnesium) .Marland Kitchen... 1 by mouth qd    Levsin 0.125 Mg Tabs (Hyoscyamine sulfate) .Marland Kitchen... 1-2 by mouth q 4-6 hours as needed abdominal pain and cramping  Problem # 7:  NONSPECIFIC ABNORMAL ELECTROCARDIOGRAM (ICD-794.31) in context of protracted diarrhea  Complete Medication List: 1)  Lipitor 40 Mg Tabs (Atorvastatin calcium) .Marland Kitchen.. 1 by mouth qd 2)  Allegra 180 Mg Tabs (Fexofenadine hcl) .Marland Kitchen.. 1 by mouth qd 3)  Nexium 40 Mg Cpdr (Esomeprazole magnesium) .Marland Kitchen.. 1 by mouth qd 4)  Viactive  .... Take 2 tablet by mouth once a day 5)  Tylenol Arthritis Pain 650 Mg Cr-tabs (Acetaminophen) .... Take two by mouth two times a day 6)  Domperidone 10 Mg  .... Take one tablet by mouth three times a day before meals 7)  Lifespan Ultra 2 Test Strips and  Lancets  .... Use as directed 8)  Ester-c Tabs (Bioflavonoid products) .... Take 1 tablet by mouth once a day 9)  Levsin 0.125 Mg Tabs (Hyoscyamine sulfate) .Marland Kitchen.. 1-2 by mouth q 4-6 hours as needed abdominal pain and cramping 10)  Amoxicillin-pot Clavulanate 875-125 Mg Tabs (Amoxicillin-pot clavulanate) .Marland Kitchen.. 1 every 12 hrs with a meal 11)  Singulair 10 Mg Tabs (Montelukast sodium) .Marland Kitchen.. 1 once daily  Other Orders: TD Toxoids IM 7 YR + (04540) Admin 1st Vaccine (98119)  Lipid Assessment/Plan:      Based on NCEP/ATP III, the patient's risk factor category is "0-1 risk factors".  The patient's lipid goals are as follows: Total cholesterol goal is 200; LDL cholesterol goal is 160;  HDL cholesterol goal is 40; Triglyceride goal is 150.  Her LDL cholesterol goal has been met.    Patient Instructions: 1)  Schedule fasting labs next week: 2)  Modified clear liquid over 24-48 hrs, then slowly add other liquids and food as you  tolerate them. 3)  BMP; 4)  Hepatic Panel ; 5)   Boston Heart Panel (1304X)Lipid Panel ; 6)  TSH ; 7)  CBC w/ Diff ; 8)  HbgA1C ; 9)  Urine Microalbumin . Prescriptions: NEXIUM 40 MG  CPDR (ESOMEPRAZOLE MAGNESIUM) 1 by mouth qd  #90 x 3   Entered and Authorized by:   Marga Melnick MD   Signed by:   Marga Melnick MD on 10/17/2010   Method used:   Print then Give to Patient   RxID:   [id number] LIPITOR 40 MG  TABS (ATORVASTATIN CALCIUM) 1 by mouth qd  #30 x 10   Entered and Authorized by:   Marga Melnick MD   Signed by:   Marga Melnick MD on 10/17/2010   Method used:   Print then Give to Patient   RxID:   [id number]    Orders Added: 1)  Est. Patient 65& > [01027] 2)  EKG w/ Interpretation [93000] 3)  TD Toxoids IM 7 YR + [90714] 4)  Admin 1st Vaccine [25366]   Immunizations Administered:  Tetanus Vaccine:    Vaccine Type: Td    Site: left deltoid    Mfr: Sanofi Pasteur    Dose: 0.5 ml    Route: IM    Given by: Lucious Groves CMA    Exp. Date: 02/28/2012    Lot #: [id number]    VIS given: 10/04/08 version given October 17, 2010.   Immunizations Administered:  Tetanus Vaccine:    Vaccine Type: Td    Site: left deltoid    Mfr: Sanofi Pasteur    Dose: 0.5 ml    Route: IM    Given by: Lucious Groves CMA    Exp. Date: 02/28/2012    Lot #: [id number]    VIS given: 10/04/08 version given October 17, 2010.

## 2010-12-17 NOTE — Progress Notes (Signed)
Summary: triage   Phone Note Call from Patient Call back at Home Phone [phone]   Caller: Patient Call For: Dr. Russella Dar Reason for Call: Talk to Nurse Summary of Call: pt said she had a series of "muscle spasms" in her esophagus on Friday... during these spasms, pt said she had some difficulty swallowing... confirmed with pt that she has not had this happen again since Friday... offered pt next available in June, pt immediatly said she cannot wait that long because she "doesnt want it to happen again" Initial call taken by: Vallarie Mare,  March 06, 2010 4:09 PM  Follow-up for Phone Call        Patient  hasn't had any further spasms since last week.  I have advised her to use her Levsin for further spasms.  REV scheduled for 05/06/10. She is offered an earlier appointment but she declined because she will be out of town Follow-up by: Darcey Nora RN, CGRN,  March 07, 2010 9:16 AM

## 2010-12-17 NOTE — Progress Notes (Signed)
Summary: refill  Phone Note Refill Request Message from:  [facility] [phone]  Refills Requested: Medication #1:  ALLEGRA 180 MG  TABS 1 by mouth qd Initial call taken by: Barb Merino,  December 24, 2009 2:33 PM    Prescriptions: ALLEGRA 180 MG  TABS (FEXOFENADINE HCL) 1 by mouth qd  #90 Tablet x 3   Entered by:   Shonna Chock   Authorized by:   Marga Melnick MD   Signed by:   Shonna Chock on 12/24/2009   Method used:   Electronically to        [facility] [address] (retail)       [address]       [place], [place]  [address]       Ph: [phone]       Fax: [phone]   RxID:   [id number]

## 2010-12-17 NOTE — Assessment & Plan Note (Signed)
Summary: temp, chills.cbs per alida   Vital Signs:  Patient profile:   65 year old female Weight:      175 pounds Temp:     98.3 degrees F oral BP sitting:   102 / 64  (left arm)  Vitals Entered By: Doristine Devoid (Mar 18, 2010 2:09 PM) CC: chills xfri. developed fever of 102 on sat. having dry cough    History of Present Illness: 65 yo woman here today for cough.  was at the beach w/ friends and woke up Friday night w/ chills.  Had fever of 102 Sunday when she got home.  Last night temp was 103.8.  reports alternating fevers and chills.  reports temp of 102 at 11:30 am.  reports cough is lingering from cold 1 month ago.  reports a lot of pressure in top of head.  no sore throat, ear pain, nasal congestion/drainage.  slight abd pain on L side- pt feels this is due to crawling over seats to get in and out of a 6 passenger [employer]- 'like i pulled something'.  no N/V/D.  denies urinary sxs- dysuria, hematuria, urgency, frequency.  no known sick contacts.  Allergies (verified): 1)  ! Codeine 2)  ! E.e.s. 400 3)  ! Oxycodone Hcl (Oxycodone Hcl) 4)  ! Nsaids 5)  ! * Codone-Derivatives  Past History:  Past Medical History: Last updated: 10/16/2009 Hyperlipidemia GERD Hiatal hernia Gastroparesis Adenomatous Colon Polyps Arthritis Diverticulosis Tronchanteric bursitis ; Hyperglycemia (790.29); BPV   Review of Systems      See HPI  Physical Exam  General:  well-nourished,in no acute distress; alert,appropriate and cooperative throughout examination Head:  Normocephalic and atraumatic without obvious abnormalities. no TTP over sinuses Ears:  External ear exam shows no significant lesions or deformities.  Otoscopic examination reveals clear canals, tympanic membranes are intact bilaterally without bulging, retraction, inflammation or discharge. Hearing is grossly normal bilaterally. Nose:  External nasal examination shows no deformity or inflammation. Nasal mucosa are pink and moist  without lesions or exudates.  Mouth:  Oral mucosa and oropharynx without lesions or exudates.  Teeth in good repair. No pharyngeal erythema.   Neck:  No deformities, masses, or tenderness noted. Lungs:  Normal respiratory effort, chest expands symmetrically. Lungs are clear to auscultation, no crackles or wheezes.  + dry cough Heart:  Normal rate and regular rhythm. S1 and S2 normal without gallop, murmur, click, rub . Abdomen:  soft, NT/ND, + BS, no rebound or guarding.  no CVA or suprapubid tenderness   Impression & Recommendations:  Problem # 1:  FEVER (ICD-780.60) Assessment New pt's sxs vague and PE WNL.  check CXR to r/o PNA, labs to assess WBC, blood cx to r/o sepsis despite no obvious source.  UA suspicious for infxn despite no sxs.  send urine for cx and start Cipro which will cover both UTI and PNA.  reviewed supportive care and red flags that should prompt return.  Pt expresses understanding and is in agreement w/ this plan. Orders: Venipuncture (16109) TLB-CBC Platelet - w/Differential (85025-CBCD) T-Culture, Blood Routine ([id number]) Specimen Handling (91478) T-Urine Microscopic ([id number]) T-Culture, Urine ([id number]) UA Dipstick w/o Micro (manual) (81002) T-2 View CXR (71020TC)  Complete Medication List: 1)  Lipitor 40 Mg Tabs (Atorvastatin calcium) .Marland Kitchen.. 1 by mouth qd 2)  Allegra 180 Mg Tabs (Fexofenadine hcl) .Marland Kitchen.. 1 by mouth qd 3)  Nexium 40 Mg Cpdr (Esomeprazole magnesium) .Marland Kitchen.. 1 by mouth qd 4)  Viactive  .... Take 1 tablet by mouth once  a day 5)  Tylenol Arthritis Strength  .... 1 by mouth bid 6)  Flaxseed Oil  7)  Domperidone 10 Mg  .... Take one tablet by mouth three times a day before meals 8)  Lifespan Ultra 2 Test Strips and Lancets  .... Use as directed 9)  Ester-c Tabs (Bioflavonoid products) .... Take 1 tablet by mouth once a day 10)  Levsin 0.125 Mg Tabs (Hyoscyamine sulfate) .Marland Kitchen.. 1-2 by mouth q 4-6 hours as needed abdominal pain and cramping 11)   Cipro 500 Mg Tabs (Ciprofloxacin hcl) .Marland Kitchen.. 1 by mouth 2 times daily  Patient Instructions: 1)  Please go to [address] to get your chest xray 2)  We'll notify you of your lab/chest xray results 3)  Take the Cipro as directed 4)  Take 2 Tylenol every 6 hours for fever 5)  Drink plenty of fluids 6)  If no improvement or at any time worsening- please call or go to the ER 7)  Call with questions or concerns 8)  Hang in there! Prescriptions: CIPRO 500 MG TABS (CIPROFLOXACIN HCL) 1 by mouth 2 times daily  #10 x 0   Entered and Authorized by:   Neena Rhymes MD   Signed by:   Neena Rhymes MD on 03/18/2010   Method used:   Electronically to        [facility] [address] [phone][facility] (retail)       [address]       [place], [place]  [address]       Ph: [phone] or [phone]       Fax: [phone]   RxID:   [id number]   Laboratory Results   Urine Tests    Routine Urinalysis   Glucose: negative   (Normal Range: Negative) Bilirubin: negative   (Normal Range: Negative) Ketone: negative   (Normal Range: Negative) Spec. Gravity: 1.015   (Normal Range: 1.003-1.035) Blood: moderate   (Normal Range: Negative) pH: 6.0   (Normal Range: 5.0-8.0) Protein: negative   (Normal Range: Negative) Urobilinogen: 0.2   (Normal Range: 0-1) Nitrite: negative   (Normal Range: Negative) Leukocyte Esterace: moderate   (Normal Range: Negative)

## 2011-02-04 ENCOUNTER — Encounter: Payer: Self-pay | Admitting: Internal Medicine

## 2011-02-04 DIAGNOSIS — E162 Hypoglycemia, unspecified: Secondary | ICD-10-CM | POA: Insufficient documentation

## 2011-02-05 ENCOUNTER — Telehealth: Payer: Self-pay | Admitting: Internal Medicine

## 2011-02-05 NOTE — Telephone Encounter (Signed)
Left msg on machine for return call.  

## 2011-02-05 NOTE — Telephone Encounter (Signed)
Spoke w/ pt informed of reason for referral say she isn't scheduled until June and would like to know if we could give Dr. Willeen Cass office a call for sooner appt.

## 2011-02-13 NOTE — Miscellaneous (Signed)
Summary: Orders Update   Clinical Lists Changes  Problems: Added new problem of HYPOGLYCEMIA, REACTIVE (ICD-251.2) Orders: Added new Referral order of Endocrinology Referral (Endocrine) - Signed

## 2011-02-19 ENCOUNTER — Ambulatory Visit: Payer: Self-pay | Admitting: Internal Medicine

## 2011-03-04 ENCOUNTER — Ambulatory Visit: Payer: Self-pay | Admitting: Endocrinology

## 2011-04-01 NOTE — Assessment & Plan Note (Signed)
[facility]                            CARDIOLOGY OFFICE NOTE   Hannah, Bryant                    MRN:          [id number]  DATE:06/08/2007                            DOB:          Nov 29, 1945    REFERRING PHYSICIAN:  Titus Dubin. Alwyn Ren, MD,FACP,FCCP   CARDIAC CONSULTATION:   PRIMARY CARE PHYSICIAN:  Titus Dubin. Hopper, MD,FACP,FCCP   REASON FOR REFERRAL:  Abnormal ECG and positive risk factors for  ischemic heart disease.   CLINICAL HISTORY:  Hannah Bryant is 65 years old and is known to me from  previous evaluation and also known to me as a personal friend.  She was  evaluated in 2005 for chest pain.  She had a cardiac catheterization  that showed mild non-obstructive coronary disease with 30% narrowing in  the mid left anterior descending artery with some proximal  calcifications and 30% narrowing in the mid right coronary artery with  normal left ventricular function.  She has had no cardiac problems since  then and has had no recent symptoms of chest pain, shortness of breath  or palpitations.  She saw Dr. Alwyn Ren recently for medical evaluation,  and he did an electrocardiogram and found some minor nonspecific ST-T  changes which he thought were slightly minor nonspecific ST-T changes.  He referred her here for further evaluation.   Hannah Bryant has been fairly active.  She works out in an exercise facility  several days a week, and she also does working on a fairly regular  basis.   PAST MEDICAL HISTORY:  1. Significant for recently diagnosed borderline diabetes.  Hemoglobin      A1c was 6.5.  Dr. Alwyn Ren prescribed metformin, but she has not      taken that yet, and she is working with a dietician to try any      improve her diet and lose weight.  2. History of hyperlipidemia for which she takes Lipitor.  3. She smoked in the remote past, but has not smoked since 1981.  4. She has no history of hypertension, although her blood pressure  was      somewhat elevated today.  5. Previous tubal ligation.  6. Hiatal hernia with GERD.   CURRENT MEDICATIONS:  1. Reglan.  2. Nexium.  3. Lipitor.  4. Flax seed oil.  5. Vitamins.  6. Allegra.   SOCIAL HISTORY:  She is married to [employer].  She has 2 children,  Hannah Bryant and Hannah Bryant.  Hannah Bryant finished his Master's degree at [employer]  and took a job with [employer] in [place].  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  They have 1 grandchild.  She does not  smoke, and she drinks occasional wine.   FAMILY HISTORY:  Positive for cardiovascular disease.  Her father had  bypass surgery in the 74s at [facility] and died in his 38s.  Her mother  died at age 25 suddenly, presumed of cardiac problems.  She has a sister  who had a quadruple bypass at age 48.   REVIEW OF SYSTEMS:  Positive for some intermittent lightheadedness.  PHYSICAL EXAMINATION:  VITAL SIGNS:  On examination today, her blood  pressure was 153/90 and a pulse 64 and regular.  NECK:  There was no venous distention.  The carotid pulses were full,  and there no bruits.  CHEST:  Clear without rales or rhonchi.  CARDIAC:  The cardiac rhythm was regular.  The first and second heart  sounds were normal.  There were no murmurs or gallops.  ABDOMEN:  The abdomen was soft without organomegaly.  Bowel sounds were  normal.  Peripheral pulses were full.  There was no peripheral edema.  MUSCULOSKELETAL:  No deformity.  SKIN:  The skin was warm and dry.  NEUROLOGIC:  No focal neurological signs.   ELECTROCARDIOGRAM:  An electrocardiogram today was normal.  The ECG in  Dr. Frederik Pear office showed a short PR interval but with no definite  delta wave.  The PR interval on today's ECG was about 0.12.   IMPRESSION:  1. Borderline ECG.  2. Strong positive family history for coronary heart disease.  3. Recently diagnosed diabetes.  4. Hyperlipidemia.  5. Elevated blood pressure.  6. Mild non-obstructive  disease at catheterization in 2005.   RECOMMENDATIONS:  I am not too worried about the changes on Becky's ECG,  but her risk profile is quite strong, which puts her at risk for  ischemic events.  I think she is on the right track with renewed  attempts at diet, exercise, weight reduction to better control her blood  sugars.  Her blood pressure is slightly elevated today, and will see if  that remains elevated on repeat testing, and then we can decide about  appropriate treatment.  Because of her very strong risk profile, I  think she should have a rest/stress exercise Myoview scan, and will  schedule this in the next week.  I told her we would be in touch after  we have the results of her test.     Bruce R. Juanda Chance, MD, [facility]  Electronically Signed    BRB/MedQ  DD: 06/08/2007  DT: 06/09/2007  Job #: [id number]   cc:   Titus Dubin. Alwyn Ren, MD,FACP,FCCP

## 2011-04-01 NOTE — Assessment & Plan Note (Signed)
[facility]                         GASTROENTEROLOGY OFFICE NOTE   Hannah Bryant, Hannah Bryant                    MRN:          [id number]  DATE:10/27/2007                            DOB:          19-May-1946    This is a return office visit for gastroesophageal reflux disease and  gastroparesis. A recent gastric emptying scan revealed 93% retention at  120 minutes.  She was started on metoclopramide 10 mg a.c. and q.h.s.,  and has had a substantial improvement in symptoms.  She has no  dysphagia, odynophagia, nausea, vomiting or early satiety.  She has  decreased her metoclopramide to a.c. and discontinued the bedtime dose.   Current medications  listed on the chart, updated and reviewed.   MEDICATION ALLERGIES:  CODEINE, ERYTHROMYCIN, VIOXX.   PHYSICAL EXAMINATION:  No acute distress.  Weight 171.4 pounds.  Blood  pressure 142/72, pulse 66 and regular.  CHEST:  Clear to auscultation bilaterally.  CARDIAC:  Regular rate and rhythm without murmurs appreciated.  ABDOMEN:  Soft and nontender, with normal active bowel sounds.  No  palpable organomegaly, masses or hernias.   ASSESSMENT AND PLAN:  1. GASTROESOPHAGEAL REFLUX DISEASE and GASTROPARESIS.  Maintain Nexium      40 mg p.o. q.a.m. long term.  She will need to remain on a      promotility agent longterm, as well as an antireflux and      gastroparesis diet.  I discussed the risks, benefits and      alternatives to Reglan and domperidone.  We will renew      metoclopramide for one more month, and she prefers to switch to      domperidone 10 mg a.c. for longterm usage.  Return office visit      with me in 6 months.  2. PERSONAL HISTORY OF TUBULOVILLOUS ADENOMAS.  Recall colonoscopy      recommended for November 2009.     Venita Lick. Russella Dar, MD, [facility]  Electronically Signed    MTS/MedQ  DD: 10/27/2007  DT: 10/28/2007  Job #: [id number]   cc:   Titus Dubin. Alwyn Ren, MD,FACP,FCCP

## 2011-04-01 NOTE — Assessment & Plan Note (Signed)
[facility]                         GASTROENTEROLOGY OFFICE NOTE   Hannah Bryant, Hannah Bryant                    MRN:          [id number]  DATE:07/26/2007                            DOB:          12-Sep-1946    REFERRING PHYSICIAN:  Titus Dubin. Alwyn Ren, MD,FACP,FCCP   OFFICE CONSULTATION.   REASON FOR CONSULT:  Dysphagia and GERD.   HISTORY OF PRESENT ILLNESS:  This is a 65 year old white female that I  have seen in the past for GERD, adenomatous colon polyps and  diverticulosis.  Her last colonoscopy was in November of 2007.  She has  a history of a large sessile transverse colon tubulovillous adenoma  removed in August of 2006.  Her followup colonoscopy showed residual  tissue at the polypectomy site and this was removed.  A followup  colonoscopy was recommended for November, 2009.  She has a history of  GERD and underwent endoscopy in June of 2000.  She had a large hiatal  hernia.  She subsequently underwent repair of her hiatal hernia with a  Nissen fundoplication by Dr. Orson Slick in August of 2000.  Shortly after  her surgery her reflux symptoms recurred and she has been treated with  Nexium since that time.  Over the past several years she has had  intermittent difficulty swallowing solid foods.  These symptoms have  progressed in frequency over the past few years.  Despite taking Nexium  on a regular basis, she notes frequent breakthrough symptoms and  heartburn.  She has also noted significant nausea with standard  conscious sedation and she was given Benadryl prior to her last  colonoscopy, which was very effective in preventing post procedure  nausea.  She has no odynophagia, weight loss, fevers or chills.   PAST MEDICAL HISTORY:  1. Hyperlipidemia.  2. Arthritis.  3. Diverticulosis.  4. Tubulovillous adenomatous colon polyps.  5. Status post repair of a hiatal hernia and Nissen fundoplication      August, 2000.  6. GERD.  7. Status post  myomectomy of a uterine fibroid.  8. Status post tubal ligation.  9. Allergic rhinitis.   CURRENT MEDICATIONS:  Listed on the chart, updated and reviewed.   MEDICATION ALLERGIES:  1. CODEINE.  2. ERYTHROMYCIN.  3. VIOXX.   SOCIAL HISTORY AND REVIEW OF SYSTEMS:  Per the handwritten form.   PHYSICAL EXAM:  A well-developed, well-nourished, overweight white  female, height 5 feet 3 inches, blood pressure is 138/76, pulse 60 and  regular.  HEENT EXAM:  Anicteric sclera, oropharynx clear.  CHEST:  Clear to auscultation bilaterally.  CARDIAC:  Regular rate and rhythm without murmurs appreciated.  ABDOMEN:  Soft, nontender, nondistended, normoactive bowel sounds.  No  palpable organomegaly, masses or hernias.  NEUROLOGIC:  Alert and oriented x3.  Grossly nonfocal.   ASSESSMENT AND PLAN:  1. Dysphagia and reflux symptoms.  Status post hiatal hernia repair      and Nissen fundoplication in August, 2000.  Rule out esophageal      strictures, esophagitis, loosening of her fundoplication and other      disorders.  Increase Nexium to 40 milligrams by  mouth twice daily      and begin all standard antireflux measures.  Discontinue Reglan.      Based on endoscopy findings, will consider a gastric emptying scan      for further evaluation.  Risks, benefits and alternatives to upper      endoscopy with possible biopsy and possible dilation discussed with      the patient and she consents to proceed.  This will be scheduled      electively.  2. Personal history of a tubulovillous adenomatous colon polyp.      Recall colonoscopy recommended for November, 2009.  3. Diverticulosis.  Long term high fiber diet with adequate fluid      intake.     Venita Lick. Russella Dar, MD, [facility]  Electronically Signed    MTS/MedQ  DD: 07/26/2007  DT: 07/27/2007  Job #: [id number]   cc:   Titus Dubin. Alwyn Ren, MD,FACP,FCCP

## 2011-04-04 NOTE — Cardiovascular Report (Signed)
NAMERYAN, PALERMO                       ACCOUNT NO.:  [id number]   MEDICAL RECORD NO.:  [id number]                   PATIENT TYPE:  INP   LOCATION:  4703                                 FACILITY:  [facility]   PHYSICIAN:  Charlies Constable, M.D. [facility]              DATE OF BIRTH:  05-31-1946   DATE OF PROCEDURE:  03/05/2004  DATE OF DISCHARGE:  03/05/2004                              CARDIAC CATHETERIZATION   CLINICAL HISTORY:  [name] Comptom is 65 years old and has had no prior  history of heart disease although she does have a very strong positive  family history for heart disease with two parents who had coronary heart  disease and a sister who had bypass surgery.  She awoke this morning with  substernal burning chest pain and came to the emergency room and was seen by  Dionicio Stall.  Her EKG showed anterior T-wave changes which were somewhat  more pronounced than her previous EKGs by Dr. Alwyn Ren.  Initial cardiac  markers were negative, but she was admitted with a diagnosis of unstable  angina and scheduled for catheterization.   PROCEDURE:  The procedure was performed via the right femoral artery using  arterial sheath and 6 French preformed coronary catheters. A front wall  arterial puncture was performed and Omnipaque contrast was used.  A distal  aortogram was performed to rule out renal vascular causes for hypertension.  We were unable to close the right femoral artery due to puncture site being  below the bifurcation.  The patient tolerated the procedure well and left  the laboratory in satisfactory condition.   RESULTS:  Left main coronary artery:  The left main coronary was free of  significant disease.   Left anterior descending artery:  The left anterior descending artery had  irregularities and calcification in the proximal portion of the vessel.  The  LAD gave rise to two diagonal branches and two septal perforators.  There  was 30% narrowing in the mid vessel after the  second diagonal branch.   Circumflex artery:  The circumflex artery gave rise to an atrial branch,  marginal branch and a posterior lateral branch.  These vessels were free of  significant disease.   Right coronary artery:  The right coronary artery gave rise to a right  ventricular branch, posterior descending and two posterior lateral branches.  There were irregularities and tandem 30% stenosis in the mid right coronary  artery.   LEFT VENTRICULOGRAM:  The left ventriculogram performed in the RAO  projection showed good wall motion with no areas of hypokinesis.  The  estimated ejection fraction was 60%.   DISTAL AORTOGRAM:  Distal aortogram was performed which showed patent renal  arteries and no significant aortoiliac obstruction.   HEMODYNAMIC DATA:  The aortic pressure was 151/78 with a mean of 110.  The  left ventricular pressure was 151/9.   CONCLUSIONS:  Mild nonobstructive coronary artery disease with  30% narrowing  in the mid left anterior descending with some proximal calcification, no  significant obstruction of the circumflex artery and 30% narrowing in the  mid right coronary artery with normal left ventricular function.   RECOMMENDATIONS:  Reassurance.  In view of these findings, I think it is  probable that Becky's recent symptoms are not due to ischemia.  Her EKG is a  little different and the reason for this is not totally clear.  Will plan to  treat her by intensifying her regimen for reflux and will start her on one  baby aspirin a day for risk factor modification.  We may let her go home  later today.                                               Charlies Constable, M.D. [facility]    BB/MEDQ  D:  03/05/2004  T:  03/06/2004  Job:  [id number]   cc:   Titus Dubin. Alwyn Ren, M.D. [facility]   Vida Roller, M.D.  Fax: [phone]

## 2011-04-04 NOTE — H&P (Signed)
NAME:  KADIENCE, MACCHI                       ACCOUNT NO.:  [id number]   MEDICAL RECORD NO.:  [id number]                   PATIENT TYPE:  EMS   LOCATION:  [facility]                                 FACILITY:  [facility]   PHYSICIAN:  Vida Roller, M.D.                DATE OF BIRTH:  1946/03/15   DATE OF ADMISSION:  03/05/2004  DATE OF DISCHARGE:                                HISTORY & PHYSICAL   HISTORY OF PRESENT ILLNESS:  Ms. Rosello is a 65 year old woman with a past  medical history significant for gastroesophageal reflux disease who  presented to the emergency room early this morning with an episode of  discomfort in her chest which awakened her from sleep.  She states she had  an upper respiratory infection over the course of the last day or so with  sinus congestion, and she had taken some Tylenol Cold Medication for sleep,  and awoke at 4 o'clock this morning with sudden onset of discomfort in the  center of her chest associated with diaphoresis, nausea, and palpitations.  She awakened her husband, and was taken to the emergency department.  By the  time she arrived in the emergency department, her discomfort was gone.  However, she had a second episode of discomfort in the emergency department  which responded to sublingual nitroglycerin.  She has had multiple episodes  of mild nausea associated with this.  She denies any PND or orthopnea, no  syncope, no radiation to the discomfort.   PAST MEDICAL HISTORY:  Significant for gastroesophageal reflux disease.  She  is status post Nissen fundoplication a few years ago for a hiatal hernia.  She also has a history of hypoglycemia.  She denies any history of  hypertension, diabetes or hyperlipidemia.   SURGICAL HISTORY:  She has the Nissen fundoplication several years ago under  general endotracheal anesthesia without difficulty.  She has had two  children, a number of years ago without any complications.   ALLERGIES:  CODEINE AND  ERYTHROMYCIN ES.   MEDICATIONS:  She is on no medications currently.   SOCIAL HISTORY:  She does not smoke cigarettes.  She rarely drinks alcohol.  She does not use any illicit drugs.  She works from the home, but is working  currently at the [employer].   REVIEW OF SYSTEMS:  Generally negative except for that mentioned in the  history of present illness.   PHYSICAL EXAMINATION:  GENERAL:  She is a well-developed, well-nourished  white female, in no apparent distress, who is alert and oriented x4 and a  very good historian.  VITAL SIGNS:  Heart rate was 82, blood pressure 152/82.  Respiratory rate  18, and she was afebrile.  HEENT:  Examination of the head, ears, eyes, nose and throat is  unremarkable.  NECK:  Supple.  There is no jugular venous distension or carotid bruits.  CHEST:  Clear to auscultation.  GU/RECTAL/BREAST:  Exams  were all deferred.  CARDIOVASCULAR:  Exam reveals a nondisplaced point of maximal impulse, no  lisps or thrills, first and second heart sounds are normal.  There is no  third heart sound.  There is an easily heard fourth heart sound.  There are  no murmurs noted.  ABDOMEN:  Soft, nontender, normoactive bowel sounds.  PULSES:  All 2+ throughout.  No bruits noted.  There is no cyanosis,  clubbing or edema in her lower extremities.  NEUROLOGIC:  Exam is generally nonfocal.   Electrocardiogram shows sinus rhythm at the rate of 61 with nonspecific ST-T  wave changes.  She does have inverted T waves across her anterior precordium  from V1 through to V4.  We have no old EKG's for comparison.  However, there  are old EKG's at Dr. Frederik Pear office.   LABORATORY DATA:  Her hemoglobin and hematocrit are 13.6 and 40.  White  blood cell count is 8.9, platelets count 169,000.  Her INR is 1.0, PTT 12.9.  Sodium 139, potassium 3.3, chloride 106, bicarbonate was not done.  BUN 13,  creatinine is still pending.  She has two sets of point of care cardiac  markers  which are not consistent with acute myocardial infarction.   ASSESSMENT:  This is a woman with chest discomfort which is atypical for  coronary disease but has concerning EKG changes.  She has hypertension which  is new onset and a family history of coronary disease, and also a history of  gastroesophageal reflux disease.   PLAN:  Start her on IV nitroglycerin, aspirin and heparin.  We will also use  a beta-block and a statin.  She should probably go to the catheterization  laboratory as soon as possible.  I have discussed the indications, potential  benefits, potential outcomes, alternatives and risk of heart catheterization  with her.  She appears to understand and wishes to undergo the procedure.                                                Vida Roller, M.D.    JH/MEDQ  D:  03/05/2004  T:  03/06/2004  Job:  [id number]

## 2011-04-10 ENCOUNTER — Ambulatory Visit (INDEPENDENT_AMBULATORY_CARE_PROVIDER_SITE_OTHER): Payer: BC Managed Care – PPO | Admitting: Internal Medicine

## 2011-04-10 ENCOUNTER — Encounter: Payer: Self-pay | Admitting: Internal Medicine

## 2011-04-10 DIAGNOSIS — E0590 Thyrotoxicosis, unspecified without thyrotoxic crisis or storm: Secondary | ICD-10-CM

## 2011-04-10 DIAGNOSIS — E162 Hypoglycemia, unspecified: Secondary | ICD-10-CM

## 2011-04-10 DIAGNOSIS — E119 Type 2 diabetes mellitus without complications: Secondary | ICD-10-CM

## 2011-04-10 DIAGNOSIS — E785 Hyperlipidemia, unspecified: Secondary | ICD-10-CM

## 2011-04-10 LAB — INSULIN, RANDOM: Insulin: 19 u[IU]/mL (ref 3–28)

## 2011-04-10 NOTE — Progress Notes (Signed)
Subjective:    Patient ID: Hannah Bryant, female    DOB: 15-May-1946, 65 y.o.   MRN: [id number]  HPI Diabetes status assessment : Fasting or morning glucose range: 88-106 or average : not averaged . Highest glucose 2 hours after any meal :< 150 . Hypoglycemia : between 3& 5 pm almost  every day. Lunch eaten @ noon ; snack of fruit,humus, cheese, or peanut butter  & Extend bar @ 1:30- 2 pm. Symptoms worse with exercise. Dr Talmage Nap evaluated this clinical picture  . ROS: Excess thirst /  hunger  / urination:  no.  Lightheadedness with standing: no . Chest pain:  no . Palpitations: no . Pain in  calves with walking : no . Non healing skin  ulcers or sores : no.  Numbness , tingling or burning in feet :no .                                                                                                                                                    Significant change in  Weight : stable .  Vision changes:  no  .  Exercise : gym 2X/week X 60 min; walking.  Nutrition/diet : low carb, high protein.                                                                               Medication compliance:  yes. Adverse  Medication effects: on no DM meds . Eye exam : 04/12; no retinopathy.  Foot care:  no . A1c/ urine microalbumin monitor:  A1c 6.4 % 03/12 by Dr Talmage Nap.  Cortisol @ 4 pm was 6. C-peptide 7.8. Insulin 43.6.     Review of Systems     Objective:   Physical Exam Gen.:  well-nourished; in no acute distress Eyes: Extraocular motion intact; no significant  lid lag or proptosis Heart: Normal rhythm and slow  rate without significant murmur, gallop, or extra heart sounds Lungs: Chest clear to auscultation without rales,rales, wheezes Neuro:Deep tendon reflexes are equal and within normal limits; no tremor  Skin: Warm and dry without significant lesions or rashes; no onycholysis Psych: Normally communicative and interactive; tearful due to frustration related to hypoglycemic effects.         Assessment & Plan:  #1 recurrent hypoglycemia in the postprandial state despite nutritional/dietary interventions by Dr. Durwin Nora  #2 diabetes well controlled  #3 elevated insulin levels ( 43.6) as per Dr. Talmage Nap  Plan: The insulin level will be rechecked; the blood was drawn 2 hours after the 10:30  AM snack today. I'll review reactive hypoglycemia and elevated insulin levels onDynamed and make recommendations after that literature review.

## 2011-04-10 NOTE — Progress Notes (Signed)
Addended by: Floydene Flock on: 04/10/2011 01:29 PM   Modules accepted: Orders

## 2011-04-10 NOTE — Patient Instructions (Addendum)
I will review the topic reactive hypoglycemia and excess insulin secretion in the literature and get back with you. We'll check the insulin level at this time which would be 2 hours after a snack (1/2 of Extend Bar;0 sugar;protein 11 grams). Eat a [employer]  As snack.Review [employer] concerning LOW glycemic carbs.

## 2011-04-15 ENCOUNTER — Other Ambulatory Visit: Payer: Self-pay | Admitting: Internal Medicine

## 2011-04-15 ENCOUNTER — Other Ambulatory Visit: Payer: Self-pay

## 2011-04-15 DIAGNOSIS — E119 Type 2 diabetes mellitus without complications: Secondary | ICD-10-CM

## 2011-04-15 MED ORDER — METFORMIN HCL ER 500 MG PO TB24: 500.0000 mg | ORAL_TABLET | Freq: Every day | ORAL | Status: DC

## 2011-04-15 NOTE — Telephone Encounter (Signed)
RX mailed with copy of labs

## 2011-06-02 ENCOUNTER — Encounter: Payer: Self-pay | Admitting: Internal Medicine

## 2011-06-06 ENCOUNTER — Other Ambulatory Visit: Payer: Self-pay | Admitting: Internal Medicine

## 2011-06-06 DIAGNOSIS — E162 Hypoglycemia, unspecified: Secondary | ICD-10-CM

## 2011-06-06 DIAGNOSIS — E119 Type 2 diabetes mellitus without complications: Secondary | ICD-10-CM

## 2011-06-09 ENCOUNTER — Other Ambulatory Visit (INDEPENDENT_AMBULATORY_CARE_PROVIDER_SITE_OTHER): Payer: BC Managed Care – PPO

## 2011-06-09 DIAGNOSIS — E119 Type 2 diabetes mellitus without complications: Secondary | ICD-10-CM

## 2011-06-09 DIAGNOSIS — E162 Hypoglycemia, unspecified: Secondary | ICD-10-CM

## 2011-06-09 NOTE — Progress Notes (Signed)
Labs only

## 2011-06-18 ENCOUNTER — Encounter: Payer: Self-pay | Admitting: Internal Medicine

## 2011-06-18 ENCOUNTER — Ambulatory Visit (INDEPENDENT_AMBULATORY_CARE_PROVIDER_SITE_OTHER): Payer: BC Managed Care – PPO | Admitting: Internal Medicine

## 2011-06-18 ENCOUNTER — Telehealth: Payer: Self-pay | Admitting: *Deleted

## 2011-06-18 DIAGNOSIS — E119 Type 2 diabetes mellitus without complications: Secondary | ICD-10-CM

## 2011-06-18 DIAGNOSIS — E162 Hypoglycemia, unspecified: Secondary | ICD-10-CM

## 2011-06-18 DIAGNOSIS — E161 Other hypoglycemia: Secondary | ICD-10-CM

## 2011-06-18 MED ORDER — METFORMIN HCL ER 500 MG PO TB24: 500.0000 mg | ORAL_TABLET | Freq: Every day | ORAL | Status: DC

## 2011-06-18 NOTE — Patient Instructions (Signed)
Eat a low-fat diet with lots of fruits and vegetables, up to 7-9 servings per day. Consume less than 30 grams of sugar per day from foods & drinks with High Fructose Corn Sugar as #1,2,3 or # 4 on label. Follow the low carb nutrition program in [employer] as closely as possible to prevent Diabetes progression & complications. White carbohydrates (potatoes, rice, bread, and pasta) have a high spike of sugar and a high load of sugar. For example a  baked potato has a cup of sugar and a  french fry  2 teaspoons of sugar. Yams, wild  rice, whole grained bread &  wheat pasta have been much lower spike and load of  sugar. Portions should be the size of a deck of cards or your palm.  Change Metformin to lunch. A1c in 4 months.

## 2011-06-18 NOTE — Progress Notes (Signed)
  Subjective:    Patient ID: Hannah Bryant, female    DOB: Dec 22, 1945, 65 y.o.   MRN: [id number]  HPI  Correction: FBS 112-120 , NOT 220.    Review of Systems     Objective:   Physical Exam        Assessment & Plan:

## 2011-06-18 NOTE — Telephone Encounter (Signed)
Pt called and states she read over her office note and that her FBS is between 112-120, not 220.

## 2011-06-18 NOTE — Progress Notes (Signed)
  Subjective:    Patient ID: Hannah Bryant, female    DOB: [birth year], 65 y.o.   MRN: [id number]  HPI she continues to have the "crashes" in the afternoon suggesting reactive hypoglycemia. Her fasting blood sugars have been checked regularly; the range has been 112 to 220. She realizes that the old glucometer was measuring glucose is 15 points below actual values.  Her prior insulin level was 19; this suggests excessive insulin secretion. She has been taking metformin with breakfast.  The concept of insulin resistance and improve insulin sensitivity with metformin was discussed. Also hyperglycemia from High Fructose Corn Syrup sugar or hyperglycemic carbs was also discussed.   Review of Systems     Objective:   Physical Exam Gen.: Healthy and well-nourished in appearance. Alert, appropriate and cooperative throughout exam. Heart: Normal rate and rhythm. Normal S1 and S2. No gallop, click, or rub. Grade 1/2 -1 systolic  murmur.                                                                        Musculoskeletal/extremities:  No clubbing, cyanosis, edema, or deformity noted. Nail health  good. Vascular: Carotid, radial artery, dorsalis pedis and  posterior tibial pulses are full and equal. No bruits present. Neurologic: Alert and oriented x3.  Skin: Intact without suspicious lesions or rashes. Psych: Mood and affect are normal. Normally interactive                                                                                         Assessment & Plan:  #1 diabetes, adequate control with an A1c of 6.9. Her prior glucometer was given around his readings 15 points below the true value.  #2 reactive hypoglycemia with excessive insulin secretion  Plan: Change the metformin to lunchtime to prevent the disordance in glucose and insulin interactions.

## 2011-06-18 NOTE — Telephone Encounter (Signed)
I'll change as Appendum

## 2011-07-01 ENCOUNTER — Telehealth: Payer: Self-pay | Admitting: Internal Medicine

## 2011-07-01 ENCOUNTER — Other Ambulatory Visit: Payer: Self-pay | Admitting: Internal Medicine

## 2011-07-01 MED ORDER — GLUCOSE BLOOD VI STRP: ORAL_STRIP | Status: DC

## 2011-07-01 NOTE — Telephone Encounter (Signed)
Spoke w/ pt informed that she is doing what could be recommended instructed to keep appt pt ok'd information.

## 2011-07-02 ENCOUNTER — Encounter: Payer: Self-pay | Admitting: Internal Medicine

## 2011-07-02 ENCOUNTER — Ambulatory Visit (INDEPENDENT_AMBULATORY_CARE_PROVIDER_SITE_OTHER): Payer: BC Managed Care – PPO | Admitting: Internal Medicine

## 2011-07-02 DIAGNOSIS — E11649 Type 2 diabetes mellitus with hypoglycemia without coma: Secondary | ICD-10-CM | POA: Insufficient documentation

## 2011-07-02 DIAGNOSIS — IMO0001 Reserved for inherently not codable concepts without codable children: Secondary | ICD-10-CM

## 2011-07-02 DIAGNOSIS — M791 Myalgia, unspecified site: Secondary | ICD-10-CM

## 2011-07-02 DIAGNOSIS — E119 Type 2 diabetes mellitus without complications: Secondary | ICD-10-CM

## 2011-07-02 DIAGNOSIS — M2550 Pain in unspecified joint: Secondary | ICD-10-CM

## 2011-07-02 DIAGNOSIS — R51 Headache: Secondary | ICD-10-CM

## 2011-07-02 LAB — CBC WITH DIFFERENTIAL/PLATELET
Basophils Relative: 0.3 % (ref 0.0–3.0)
Eosinophils Absolute: 0.1 10*3/uL (ref 0.0–0.7)
Eosinophils Relative: 1.3 % (ref 0.0–5.0)
HCT: 40.8 % (ref 36.0–46.0)
Hemoglobin: 13.8 g/dL (ref 12.0–15.0)
Lymphs Abs: 2.6 10*3/uL (ref 0.7–4.0)
MCHC: 33.8 g/dL (ref 30.0–36.0)
MCV: 95.5 fl (ref 78.0–100.0)
Monocytes Absolute: 0.5 10*3/uL (ref 0.1–1.0)
Neutro Abs: 4.7 10*3/uL (ref 1.4–7.7)
Neutrophils Relative %: 59.6 % (ref 43.0–77.0)
RBC: 4.27 Mil/uL (ref 3.87–5.11)
WBC: 7.9 10*3/uL (ref 4.5–10.5)

## 2011-07-02 MED ORDER — GABAPENTIN 100 MG PO CAPS: 100.0000 mg | ORAL_CAPSULE | ORAL | Status: DC

## 2011-07-02 NOTE — Progress Notes (Signed)
Subjective:    Patient ID: Hannah Bryant, female    DOB: [birth year], 65 y.o.   MRN: [id number]  HPI  BODY ACHES & HEADACHE : Onset: 06/28/2011 as L shoulder &  back  aching   Frequency: constant with movement Progression:diffuse aching as of 8/12 with dull, frontal headache   Prior treatment: Tylenol Arthritis every 6 hrs Associated Symptoms Nausea/vomiting: no  Photophobia/phonophobia: no  Tearing of eyes: minor in OD  Sinus pain/pressure: no  PMH  migraine: yes, pre 1st pregnancy   Red Flags Fever: no  Neck pain/stiffness: no,   Vision/speech/swallow/hearing difficulty: no  Focal weakness/numbness: no  Altered mental status: no  Trauma: no  New type of headache: yes,   Anticoagulant use: no   Pet dog has been sick with a cough as well.       Review of Systems she denies any rash or tick exposure. She has not had any purulent nasal secretion or cough.FBS < 120.           Objective:   Physical Exam Gen.: Healthy and well-nourished in appearance. Alert, appropriate and cooperative throughout exam. Head: Normocephalic without obvious abnormalities Eyes: No corneal or conjunctival inflammation noted. Pupils equal round reactive to light and accommodation. FOV WNL. Extraocular motion intact. Vision grossly normal with lenses Ears: External  ear exam reveals no significant lesions or deformities. Canals clear .TMs normal. Hearing is grossly normal bilaterally. Nose: External nasal exam reveals no deformity or inflammation. Nasal mucosa are pink and moist. No lesions or exudates noted. Septum slightly dislocated & deviated Mouth: Oral mucosa and oropharynx reveal no lesions or exudates. Teeth in good repair. Neck: No deformities, masses, or tenderness noted. Range of motion normal; neck supple. Lungs: Normal respiratory effort; chest expands symmetrically. Lungs are clear to auscultation without rales, wheezes, or increased work of breathing. Heart: Normal rate and rhythm.  Normal S1 and S2. No gallop, click, or rub. No  murmur. Abdomen: Bowel sounds normal; abdomen soft and nontender. No masses, organomegaly or hernias noted.                                                                                  Musculoskeletal/extremities: Slight lordosis  noted of  the thoracic spine. No clubbing, cyanosis, edema, or deformity noted. Range of motion  normal .Tone & strength  normal.Joints normal. Nail health  Good. Neg SLR Vascular: Carotid, radial artery, dorsalis pedis and  posterior tibial pulses are full and equal. No bruits present. Neurologic: Alert and oriented x3. Deep tendon reflexes symmetrical and normal. Gait, Romberg testing, and finger to nose are all normal        Skin: Intact without suspicious lesions or rashes. Lymph: No cervical, axillary  lymphadenopathy present. Psych: Mood and affect are normal. Normally interactive  Assessment & Plan:  #1 diffuse  Myalgias & arthralgias; history suggest possible viral exposure  #2, headache with no neurologic deficit present  Plan: See orders and recommendations.

## 2011-07-02 NOTE — Patient Instructions (Signed)

## 2011-07-07 ENCOUNTER — Telehealth: Payer: Self-pay | Admitting: *Deleted

## 2011-07-07 NOTE — Telephone Encounter (Signed)
Pt left message that she would like to get lab results. Left message to call office    Complete blood count is totally normal; no elevation in white count, anemia, or abnormal cells are present. Sedimentation rate is a non specific test which is elevated with any inflammatory process ( Ex. Active Rheumatoid Arthritis; certain forms of thyroiditis,etc).  CK is an enzyme released with any muscle injury or overuse; it is elevated. Please STOP Lipitor; recheck CK in 1 week (729.1).[employer]

## 2011-07-08 NOTE — Telephone Encounter (Signed)
Spoke with patient, lab results given. Patient schedule appointment to recheck labs in 1 week

## 2011-07-15 ENCOUNTER — Other Ambulatory Visit: Payer: Self-pay | Admitting: Internal Medicine

## 2011-07-15 DIAGNOSIS — IMO0001 Reserved for inherently not codable concepts without codable children: Secondary | ICD-10-CM

## 2011-07-16 ENCOUNTER — Other Ambulatory Visit (INDEPENDENT_AMBULATORY_CARE_PROVIDER_SITE_OTHER): Payer: BC Managed Care – PPO

## 2011-07-16 DIAGNOSIS — IMO0001 Reserved for inherently not codable concepts without codable children: Secondary | ICD-10-CM

## 2011-07-16 NOTE — Progress Notes (Signed)
Labs only

## 2011-08-22 LAB — GLUCOSE, CAPILLARY
Glucose-Capillary: 113 mg/dL — ABNORMAL HIGH (ref 70–99)
Glucose-Capillary: 94 mg/dL (ref 70–99)

## 2011-09-11 ENCOUNTER — Ambulatory Visit: Payer: BC Managed Care – PPO | Admitting: Internal Medicine

## 2011-09-12 ENCOUNTER — Other Ambulatory Visit: Payer: Self-pay | Admitting: Gastroenterology

## 2011-09-23 ENCOUNTER — Other Ambulatory Visit: Payer: Self-pay | Admitting: Internal Medicine

## 2011-09-24 ENCOUNTER — Other Ambulatory Visit: Payer: Self-pay | Admitting: Gastroenterology

## 2011-10-21 ENCOUNTER — Encounter: Payer: Self-pay | Admitting: Internal Medicine

## 2011-10-21 ENCOUNTER — Other Ambulatory Visit: Payer: Self-pay | Admitting: Internal Medicine

## 2011-10-21 ENCOUNTER — Ambulatory Visit (INDEPENDENT_AMBULATORY_CARE_PROVIDER_SITE_OTHER): Payer: BC Managed Care – PPO | Admitting: Internal Medicine

## 2011-10-21 VITALS — BP 128/80 | HR 65 | Temp 98.3°F | Resp 12 | Ht 62.75 in | Wt 182.4 lb

## 2011-10-21 DIAGNOSIS — E785 Hyperlipidemia, unspecified: Secondary | ICD-10-CM

## 2011-10-21 DIAGNOSIS — Z0000 Encounter for general adult medical examination without abnormal findings: Secondary | ICD-10-CM

## 2011-10-21 DIAGNOSIS — R9431 Abnormal electrocardiogram [ECG] [EKG]: Secondary | ICD-10-CM

## 2011-10-21 DIAGNOSIS — Z860100 Personal history of colon polyps, unspecified: Secondary | ICD-10-CM

## 2011-10-21 DIAGNOSIS — E119 Type 2 diabetes mellitus without complications: Secondary | ICD-10-CM

## 2011-10-21 NOTE — Patient Instructions (Signed)
Preventive Health Care: Exercise  30-45  minutes a day, 3-4 days a week. Walking is especially valuable in preventing Osteoporosis. Eat a low-fat diet with lots of fruits and vegetables, up to 7-9 servings per day. Consume less than 30 grams of sugar per day from foods & drinks with High Fructose Corn Syrup as # 1,2,3 or #4 on label. Eye Doctor - have an eye exam @ least annually Please  schedule fasting Labs : BMET,Lipids, hepatic panel, CBC & dif, TSH, A1c, urine microalbumin, CK. PLEASE BRING THESE INSTRUCTIONS TO FOLLOW UP  LAB APPOINTMENT.This will guarantee correct labs are drawn, eliminating need for repeat blood sampling ( needle sticks ! ). Diagnoses /Codes: V70.0 .

## 2011-10-21 NOTE — Progress Notes (Signed)
Subjective:    Patient ID: Hannah Bryant, female    DOB: 04-02-1946, 65 y.o.   MRN: [id number]  HPI  Hannah Bryant  is here for a physical;acute issues include recurrent  hypoglycemic episodes treated with snacks & occasionally with tablets.These occur almost daily, starting  mid afternoon, 2 hours after meal.      Review of Systems HYPERLIPIDEMIA: Medications: Compliance- yes; she restarted it w/o symptoms. This CK was 264 on 07/02/11; it subsequently dropped to a value of 71. She is unsure whether she was on a statin at the followup labs Abd pain, bowel changes- frank diarrhea with hypoglycemia usually if glucose < 60  Muscle aches- no Chest pain, palpitations- no       Dyspnea- no Lightheadedness,Syncope- no    Edema- no  DIABETES: Disease Monitoring: Blood Sugar ranges-FBS 106-119  Polyuria/phagia/dipsia- no       Visual problems- no; last Ophth exam in Spring , no retinopathy Medications: Compliance- yes            Objective:   Physical Exam Gen.: Healthy and well-nourished in appearance. Alert, appropriate and cooperative throughout exam. Head: Normocephalic without obvious abnormalities; hair fine Eyes: No corneal or conjunctival inflammation noted. Pupils equal round reactive to light and accommodation. Fundal exam is benign without hemorrhages, exudate, papilledema. Extraocular motion intact. Vision grossly normal with lenses. Ears: External  ear exam reveals no significant lesions or deformities. Canals clear .TMs normal. Hearing is grossly normal bilaterally. Nose: External nasal exam reveals no deformity or inflammation. Nasal mucosa are pink and moist. No lesions or exudates noted. Septum dislocated to R & deviated to L  Mouth: Oral mucosa and oropharynx reveal no lesions or exudates. Teeth in good repair. Neck: No deformities, masses, or tenderness noted. Range of motion & . Thyroid normal. ? Cervical rib on L Lungs: Normal respiratory effort; chest expands  symmetrically. Lungs are clear to auscultation without rales, wheezes, or increased work of breathing. Heart: Normal rate and rhythm. Normal S1 and S2. No gallop, click, or rub. Faint R basilar systolic  murmur. Abdomen: Bowel sounds normal; abdomen soft and nontender. No masses, organomegaly or hernias noted. Genitalia: Dr Eda Paschal   .                                                                                   Musculoskeletal/extremities: Slight lordosis noted of  the thoracic spine. No clubbing, cyanosis, edema, or deformity noted. Range of motion  normal .Tone & strength  normal.Joints normal. Nail health  good. Vascular: Carotid, radial artery, dorsalis pedis and  posterior tibial pulses are full and equal. No bruits present. Neurologic: Alert and oriented x3. Deep tendon reflexes symmetrical and normal.          Skin: Intact without suspicious lesions or rashes. Lymph: No cervical, axillary lymphadenopathy present. Psych: Mood and affect are normal. Normally interactive  Assessment & Plan:  #1 comprehensive physical exam; no acute findings #2 see Problem List with Assessments & Recommendations.  #3 frequent, almost daily, debilitating hypoglycemic episodes. She's been previously evaluated by an Actor in [place]. I've asked her to consider referral to a center of excellence. Plan: see Orders.  EKG reveals a short PR interval of 108 ms; PR interval was 118 ms in 2004. She has diffuse nonspecific T changes with some loss of voltage, especially the V leads. Of note is that she had a negative catheterization in 2005.

## 2011-10-22 ENCOUNTER — Encounter: Payer: Self-pay | Admitting: Gastroenterology

## 2011-10-22 ENCOUNTER — Ambulatory Visit (INDEPENDENT_AMBULATORY_CARE_PROVIDER_SITE_OTHER): Payer: BC Managed Care – PPO | Admitting: Gastroenterology

## 2011-10-22 VITALS — BP 118/70 | HR 60 | Ht 62.75 in | Wt 182.0 lb

## 2011-10-22 DIAGNOSIS — K219 Gastro-esophageal reflux disease without esophagitis: Secondary | ICD-10-CM

## 2011-10-22 DIAGNOSIS — R197 Diarrhea, unspecified: Secondary | ICD-10-CM

## 2011-10-22 DIAGNOSIS — R109 Unspecified abdominal pain: Secondary | ICD-10-CM

## 2011-10-22 DIAGNOSIS — K3184 Gastroparesis: Secondary | ICD-10-CM

## 2011-10-22 MED ORDER — ESOMEPRAZOLE MAGNESIUM 40 MG PO CPDR: 40.0000 mg | DELAYED_RELEASE_CAPSULE | Freq: Every day | ORAL | Status: DC

## 2011-10-22 MED ORDER — AMBULATORY NON FORMULARY MEDICATION: Status: DC

## 2011-10-22 MED ORDER — HYOSCYAMINE SULFATE 0.125 MG SL SUBL: SUBLINGUAL_TABLET | SUBLINGUAL | Status: DC

## 2011-10-22 NOTE — Patient Instructions (Addendum)
Your prescriptions for Nexium and Levsin have been sent to your pharmacy.  Your prescription for Domperidone has been printed and given to you to send to [place].  cc: Marga Melnick, MD

## 2011-10-22 NOTE — Progress Notes (Signed)
History of Present Illness: This is a 65 year old female who relates frequent problems with hypoglycemia with blood sugars occasionally dropping to the 20-30 range. The symptoms occur frequently in the afternoon. Occasionally she will have crampy lower abdominal pain and diarrhea that lasts for about 2 hours immediately following her hypoglycemic episodes. Symptoms do not occur at other times. Her reflux symptoms are under very good control on her current regimen. Denies weight loss, constipation, change in stool caliber, melena, hematochezia, nausea, vomiting, dysphagia, chest pain.  Current Medications, Allergies, Past Medical History, Past Surgical History, Family History and Social History were reviewed in [name].  Physical Exam: General: Well developed , well nourished, obese, no acute distress Head: Normocephalic and atraumatic Eyes:  sclerae anicteric, EOMI Ears: Normal auditory acuity Mouth: No deformity or lesions Lungs: Clear throughout to auscultation Heart: Regular rate and rhythm; no murmurs, rubs or bruits Abdomen: Soft, non tender and non distended. No masses, hepatosplenomegaly or hernias noted. Normal Bowel sounds Musculoskeletal: Symmetrical with no gross deformities  Extremities: No clubbing, cyanosis, edema or deformities noted Neurological: Alert oriented x 4, grossly nonfocal Psychological:  Alert and cooperative. Normal mood and affect  Assessment and Recommendations:  1: DIARRHEA AND LOWER ABDOMINAL PAIN associated with hypoglycemia. Apparently she is being referred to an endocrinologist. Continue to use Levsin as prescribed.   2: GASTROPARESIS.  Symptoms currently under good control. Continue standard dietary measures. Continue domperidone.   3: GASTROESOPHAGEAL REFLUX DISEASE. Continue Nexium 40 mg q.a.m. and standard antireflux measures.   4: TUBULOVILLOUS ADENOMA, COLON, HX OF. Recall colonoscopy recommended in December  2014.

## 2011-10-23 ENCOUNTER — Other Ambulatory Visit (INDEPENDENT_AMBULATORY_CARE_PROVIDER_SITE_OTHER): Payer: BC Managed Care – PPO

## 2011-10-23 DIAGNOSIS — E119 Type 2 diabetes mellitus without complications: Secondary | ICD-10-CM

## 2011-10-23 DIAGNOSIS — Z860100 Personal history of colon polyps, unspecified: Secondary | ICD-10-CM

## 2011-10-23 DIAGNOSIS — R9431 Abnormal electrocardiogram [ECG] [EKG]: Secondary | ICD-10-CM

## 2011-10-23 DIAGNOSIS — Z0000 Encounter for general adult medical examination without abnormal findings: Secondary | ICD-10-CM

## 2011-10-23 DIAGNOSIS — E785 Hyperlipidemia, unspecified: Secondary | ICD-10-CM

## 2011-10-23 LAB — CBC WITH DIFFERENTIAL/PLATELET
Eosinophils Absolute: 0.2 10*3/uL (ref 0.0–0.7)
Lymphs Abs: 2.8 10*3/uL (ref 0.7–4.0)
MCHC: 33.9 g/dL (ref 30.0–36.0)
MCV: 95.9 fl (ref 78.0–100.0)
Monocytes Absolute: 0.5 10*3/uL (ref 0.1–1.0)
Neutrophils Relative %: 53.1 % (ref 43.0–77.0)
Platelets: 169 10*3/uL (ref 150.0–400.0)
RDW: 13.2 % (ref 11.5–14.6)

## 2011-10-23 LAB — BASIC METABOLIC PANEL
GFR: 67.25 mL/min (ref >60.00)
Glucose, Bld: 108 mg/dL — ABNORMAL HIGH (ref 70–99)
Potassium: 4 mEq/L (ref 3.5–5.1)
Sodium: 143 mEq/L (ref 135–145)

## 2011-10-23 LAB — LIPID PANEL
Cholesterol: 162 mg/dL (ref 0–200)
HDL: 66 mg/dL (ref >39.00)
Triglycerides: 106 mg/dL (ref 0.0–149.0)
VLDL: 21.2 mg/dL (ref 0.0–40.0)

## 2011-10-23 LAB — CK: Total CK: 107 U/L (ref 7–177)

## 2011-10-23 LAB — HEMOGLOBIN A1C: Hgb A1c MFr Bld: 6.5 % (ref 4.6–6.5)

## 2011-10-27 ENCOUNTER — Ambulatory Visit (INDEPENDENT_AMBULATORY_CARE_PROVIDER_SITE_OTHER): Payer: BC Managed Care – PPO | Admitting: Women's Health

## 2011-10-27 ENCOUNTER — Encounter: Payer: Self-pay | Admitting: Women's Health

## 2011-10-27 VITALS — BP 130/80 | Ht 62.25 in | Wt 182.0 lb

## 2011-10-27 DIAGNOSIS — B373 Candidiasis of vulva and vagina: Secondary | ICD-10-CM

## 2011-10-27 DIAGNOSIS — Z124 Encounter for screening for malignant neoplasm of cervix: Secondary | ICD-10-CM

## 2011-10-27 MED ORDER — NYSTATIN 100000 UNIT/GM EX CREA: TOPICAL_CREAM | Freq: Two times a day (BID) | CUTANEOUS | Status: DC

## 2011-10-27 NOTE — Progress Notes (Signed)
Hannah Bryant 1946/02/12 [id number]    History:    The patient presents for complaint of vaginal itching.   Past medical history, past surgical history, family history and social history were all reviewed and documented in the EPIC chart.   ROS:  A  ROS was performed and pertinent positives and negatives are included in the history.  Exam:  Filed Vitals:   10/27/11 1043  BP: 130/80    General appearance:  Normal Head/Neck:  Normal, without cervical or supraclavicular adenopathy. Thyroid:  Symmetrical, normal in size, without palpable masses or nodularity. Respiratory  Effort:  Normal  Auscultation:  Clear without wheezing or rhonchi Cardiovascular  Auscultation:  Regular rate, without rubs, murmurs or gallops  Edema/varicosities:  Not grossly evident Abdominal  Soft,nontender, without masses, guarding or rebound.  Liver/spleen:  No organomegaly noted  Hernia:  None appreciated  Skin  Inspection:  Grossly normal  Palpation:  Grossly normal Neurologic/psychiatric  Orientation:  Normal with appropriate conversation.  Mood/affect:  Normal  Genitourinary    Breasts: Examined lying and sitting.     Right: Without masses, retractions, discharge or axillary adenopathy.     Left: Without masses, retractions, discharge or axillary adenopathy.   Inguinal/mons:  Normal without inguinal adenopathy  External genitalia:  Erythematous   BUS/Urethra/Skene's glands:  Normal  Bladder:  Normal  Vagina:  Normal  Cervix:  Normal  Uterus:  normal in size, shape and contour.  Midline and mobile  Adnexa/parametria:     Rt: Without masses or tenderness.   Lt: Without masses or tenderness.  Anus and perineum: Normal  Digital rectal exam: Normal sphincter tone without palpated masses or tenderness  Assessment/Plan:  65 y.o.  MWF G3 P2 for complaint of vaginal itching. Having problems with afternoon low blood sugars, has followup scheduled. History of seasonal allergies, increased  cholesterol, gastroparesis-primary care medications and labs. Has had both zostovac and Pneumovax vaccine, will check with primary care about Tdap. Had a normal colonoscopy in 09. History of normal mammograms and Paps. History of normal DEXA at primary care.  Yeast-external  Low blood sugars  Plan: Nystatin cream externally twice daily, loose clothes instructed to call if no relief. SBEs, continue annual mammogram, calcium rich diet encouraged, vitamin D 2000 daily. Home safety and fall prevention discussed. Reviewed weight loss, most of her weight is in her abdomen, discussed cutting calories and increasing activity.   Harrington Challenger WHNP, 1:14 PM 10/27/2011

## 2011-10-29 ENCOUNTER — Telehealth: Payer: Self-pay | Admitting: Internal Medicine

## 2011-10-29 ENCOUNTER — Other Ambulatory Visit: Payer: Self-pay | Admitting: Dermatology

## 2011-10-29 NOTE — Telephone Encounter (Signed)
Patient calling back, states Dr. Alwyn Ren had mentioned sending her to a [facility].  Patient said she is now considering, but to check if her insurance will cover, she needs more info, like location, physician name(s).  She definitely wants to see Dr. Frederik Pear 1st preference.  Please advise.

## 2011-10-30 NOTE — Telephone Encounter (Signed)
Left message on machine for patient to return call when available   

## 2011-10-30 NOTE — Telephone Encounter (Signed)
Her [facility] company [facility]. We will want to use that specific center of excellence. Please check with your insurance company as to  which is the diabetes center of excellence. If unexpectedly there is no preferred center of excellence I would recommend [facility].

## 2011-10-30 NOTE — Telephone Encounter (Signed)
Patient returned call, left message on VM for me to call her back   I called patient and left detailed message with Dr.Hopper's response

## 2011-11-03 ENCOUNTER — Encounter: Payer: Self-pay | Admitting: Obstetrics and Gynecology

## 2011-11-06 ENCOUNTER — Encounter: Payer: Self-pay | Admitting: Internal Medicine

## 2011-11-19 ENCOUNTER — Other Ambulatory Visit: Payer: Self-pay | Admitting: Internal Medicine

## 2011-11-19 MED ORDER — MONTELUKAST SODIUM 10 MG PO TABS: 10.0000 mg | ORAL_TABLET | ORAL | Status: DC | PRN

## 2011-11-19 NOTE — Telephone Encounter (Signed)
Pt was told by MD to check with her insurance company to see which [facility] she could go to for a referral to a Product/process development scientist. [facility] AND [facility] are the ones that are excepted. She would like to speak to somebody about which one Alwyn Ren would prefer her go to by phone. Also she needs refills on Lipitor and Singulair sent to [facility]

## 2011-11-27 ENCOUNTER — Telehealth: Payer: Self-pay

## 2011-11-27 ENCOUNTER — Other Ambulatory Visit: Payer: Self-pay | Admitting: Internal Medicine

## 2011-11-27 DIAGNOSIS — E162 Hypoglycemia, unspecified: Secondary | ICD-10-CM

## 2011-11-27 DIAGNOSIS — K3184 Gastroparesis: Secondary | ICD-10-CM

## 2011-11-27 NOTE — Telephone Encounter (Signed)
Message left on voicemail: Patient would like a referral to Endo, patient states her insurance will cover [place], [place] or [facility](most convenient). Patient states if she is being referred due to diabetes [facility] is connected with [facility]. Patient would overall prefer whichever Dr.Hopper prefers.  Dr.Hopper please advise and place referral

## 2011-11-28 ENCOUNTER — Telehealth: Payer: Self-pay | Admitting: Internal Medicine

## 2011-11-28 NOTE — Telephone Encounter (Signed)
Per Dr.Hopper April is ok, other option would be for patient to see Dr.Balan here.

## 2011-11-28 NOTE — Telephone Encounter (Signed)
I called to inform patient of her appointment with [facility], which is 03-05-2012 with Dr. Claiborne Rigg.  I also informed patient that this was their "1st available" appointment, and they do have her on their Wait List for a sooner appointment.  Patient then asks, "does Dr. Alwyn Ren know they are not going to see me until April?".  I assured her I would inform Dr. Alwyn Ren, patient then asks "is there no place in [facility] for me to be seen, because this will be going on 15 months".  I reminded patient that she requested to be seen at a [facility], and wanted Dr. Frederik Pear preference, and this is what he recommended.  Patient is still not satisfied, and just wants Dr. Alwyn Ren to be aware.

## 2011-11-28 NOTE — Telephone Encounter (Signed)
Referral placed.

## 2011-12-01 NOTE — Telephone Encounter (Signed)
I made patient aware, she is satisfied & will wait for April appointment.

## 2012-01-09 ENCOUNTER — Other Ambulatory Visit: Payer: Self-pay | Admitting: *Deleted

## 2012-01-09 MED ORDER — GLUCOSE BLOOD VI STRP: ORAL_STRIP | Status: DC

## 2012-01-09 NOTE — Telephone Encounter (Signed)
Rx sent, Pt aware. 

## 2012-01-23 ENCOUNTER — Telehealth: Payer: Self-pay | Admitting: Internal Medicine

## 2012-01-23 NOTE — Telephone Encounter (Signed)
I returned patient's call regarding info.  She stated that Dr. Talmage Nap had NO information from this office as to her treatment and why she was being referred there.  I have printed off all office visits, notes, labs, etc in the last six months and faxed to Dr. Willeen Cass office. Patient is frustrated with the fact that she waited two months to see this specialist and the info had not been sent from our office so Dr. Talmage Nap would have it when she saw her. I assured her I would send today.

## 2012-01-23 NOTE — Telephone Encounter (Signed)
Patient called stating Dr. Alwyn Ren referred her to Dr. Talmage Nap and that office did not receive any office notes from Dr. Alwyn Ren about why she was being referred to Dr. Talmage Nap. Patient is upset and states that she had to wait 2 1/2 months for the appointment and it was a waste of her time and money because they did not know why she was there. Patient is requesting someone to call and follow-up with her about this matter.

## 2012-02-11 ENCOUNTER — Ambulatory Visit: Payer: BC Managed Care – PPO

## 2012-04-14 ENCOUNTER — Other Ambulatory Visit: Payer: Self-pay | Admitting: Dermatology

## 2012-05-24 ENCOUNTER — Other Ambulatory Visit: Payer: Self-pay | Admitting: Dermatology

## 2012-10-15 ENCOUNTER — Other Ambulatory Visit: Payer: Self-pay | Admitting: Internal Medicine

## 2012-10-15 NOTE — Telephone Encounter (Signed)
Refill done.  

## 2012-10-21 ENCOUNTER — Encounter: Payer: Self-pay | Admitting: Internal Medicine

## 2012-10-21 ENCOUNTER — Ambulatory Visit (INDEPENDENT_AMBULATORY_CARE_PROVIDER_SITE_OTHER): Payer: BC Managed Care – PPO | Admitting: Internal Medicine

## 2012-10-21 VITALS — BP 130/80 | HR 79 | Temp 98.3°F | Resp 12 | Ht 62.08 in | Wt 184.4 lb

## 2012-10-21 DIAGNOSIS — E785 Hyperlipidemia, unspecified: Secondary | ICD-10-CM

## 2012-10-21 DIAGNOSIS — Z0000 Encounter for general adult medical examination without abnormal findings: Secondary | ICD-10-CM

## 2012-10-21 DIAGNOSIS — N959 Unspecified menopausal and perimenopausal disorder: Secondary | ICD-10-CM

## 2012-10-21 MED ORDER — ATORVASTATIN CALCIUM 40 MG PO TABS: ORAL_TABLET | ORAL | Status: DC

## 2012-10-21 MED ORDER — MONTELUKAST SODIUM 10 MG PO TABS: 10.0000 mg | ORAL_TABLET | ORAL | Status: DC | PRN

## 2012-10-21 MED ORDER — ESOMEPRAZOLE MAGNESIUM 40 MG PO CPDR: 40.0000 mg | DELAYED_RELEASE_CAPSULE | Freq: Every day | ORAL | Status: DC

## 2012-10-21 NOTE — Progress Notes (Signed)
  Subjective:    Patient ID: Hannah [facility], female    DOB: 02/04/1946, 66 y.o.   MRN: [id number]  HPI  [name] is here for a physical;active issues include recurrent hypoglycemia in afternoons.      Review of Systems HYPERLIPEDEMIA: Chest pain, palpitations- no       Dyspnea- no Lightheadedness,Syncope- no    Edema- some Abd pain, bowel changes- gastroparesis related symptoms   Muscle aches- no Medications: Compliance- yes  DIABETES: Disease Monitoring: Blood Sugar ranges- 126-134 Polyuria/phagia/dipsia- no      Visual problems- no; last Ophth exam Spring 2013; no retinopathy Medications: Compliance- no meds  Hypoglycemic symptoms- daily between 2-5 pm              Objective:   Physical Exam Gen.: well-nourished in appearance. Alert, appropriate and cooperative throughout exam. Head: Normocephalic without obvious abnormalities Eyes: No corneal or conjunctival inflammation noted. Pupils equal round reactive to light and accommodation.  Extraocular motion intact. Vision grossly normal with lenses Ears: External  ear exam reveals no significant lesions or deformities. Canals clear .TMs normal. Hearing is grossly normal bilaterally. Nose: External nasal exam reveals no deformity or inflammation. Nasal mucosa are pink and moist. No lesions or exudates noted. Septum to L  Mouth: Oral mucosa and oropharynx reveal no lesions or exudates. Teeth in good repair. Neck: No deformities, masses, or tenderness noted. Range of motion & Thyroid normal. Lungs: Normal respiratory effort; chest expands symmetrically. Lungs are clear to auscultation without rales, wheezes, or increased work of breathing. Heart: Normal rate and rhythm. Normal S1 and S2. No gallop, click, or rub. No murmur. Abdomen: Bowel sounds normal; abdomen soft . Slight epigastric tender. No masses, organomegaly or hernias noted. Genitalia: Dr Eda Paschal, Gyn                                                                                   Musculoskeletal/extremities: Accentuated curvature of upper thoracic  spine.  No clubbing, cyanosis, edema, or deformity noted. Range of motion  normal .Tone & strength  normal.Joints normal. Nail health  good. Vascular: Carotid, radial artery, dorsalis pedis and  posterior tibial pulses are full and equal. No bruits present. Neurologic: Alert and oriented x3. Deep tendon reflexes symmetrical and normal.  Light touch normal over feet.         Skin: Intact without suspicious lesions or rashes. Lymph: No cervical, axillary  lymphadenopathy present. Psych: Mood and affect are normal. Normally interactive                                                                                         Assessment & Plan:  #1 comprehensive physical exam; no acute findings #2 significant recurrent (daily) reactive hypoglycemia. I've asked her to discuss this with Dr Talmage Nap Plan: see Orders

## 2012-10-21 NOTE — Patient Instructions (Addendum)
Take the EKG to any emergency room or preop visits. There are nonspecific changes; as long as there is no new change these are not clinically significant. Review and correct the record as indicated. Please share record with all medical staff seen.  Fasting Labs : BMET,Lipids, hepatic panel, CBC & dif, TSH, A1c, urine microalbumin.Marland Kitchen  PLEASE BRING THESE INSTRUCTIONS TO FOLLOW UP  LAB APPOINTMENT @ [facility] 10/22/12.This will guarantee correct labs are drawn, eliminating need for repeat blood sampling ( needle sticks ! ). Diagnoses /Codes: V70.0.  If you activate My Chart; the results can be released to you as soon as they populate from the lab. If you choose not to use this program; the labs have to be reviewed, copied & mailed   causing a delay in getting the results to you.

## 2012-10-22 ENCOUNTER — Other Ambulatory Visit (INDEPENDENT_AMBULATORY_CARE_PROVIDER_SITE_OTHER): Payer: BC Managed Care – PPO

## 2012-10-22 DIAGNOSIS — Z0000 Encounter for general adult medical examination without abnormal findings: Secondary | ICD-10-CM

## 2012-10-22 DIAGNOSIS — E785 Hyperlipidemia, unspecified: Secondary | ICD-10-CM

## 2012-10-22 LAB — CBC WITH DIFFERENTIAL/PLATELET
Basophils Relative: 0.3 % (ref 0.0–3.0)
Eosinophils Absolute: 0.1 10*3/uL (ref 0.0–0.7)
Hemoglobin: 13.2 g/dL (ref 12.0–15.0)
Lymphocytes Relative: 37.6 % (ref 12.0–46.0)
MCHC: 33.1 g/dL (ref 30.0–36.0)
Monocytes Relative: 8.2 % (ref 3.0–12.0)
Neutro Abs: 3.6 10*3/uL (ref 1.4–7.7)
Neutrophils Relative %: 52.1 % (ref 43.0–77.0)
RBC: 4.24 Mil/uL (ref 3.87–5.11)
WBC: 6.9 10*3/uL (ref 4.5–10.5)

## 2012-10-22 LAB — BASIC METABOLIC PANEL
Calcium: 9 mg/dL (ref 8.4–10.5)
Creatinine, Ser: 0.7 mg/dL (ref 0.4–1.2)
GFR: 91.47 mL/min (ref >60.00)

## 2012-10-22 LAB — LIPID PANEL
HDL: 55.1 mg/dL (ref >39.00)
LDL Cholesterol: 66 mg/dL (ref 0–99)
Total CHOL/HDL Ratio: 3
Triglycerides: 112 mg/dL (ref 0.0–149.0)

## 2012-10-22 LAB — HEPATIC FUNCTION PANEL
Bilirubin, Direct: 0.1 mg/dL (ref 0.0–0.3)
Total Bilirubin: 0.6 mg/dL (ref 0.3–1.2)

## 2012-10-22 LAB — HEMOGLOBIN A1C: Hgb A1c MFr Bld: 6.9 % — ABNORMAL HIGH (ref 4.6–6.5)

## 2012-10-22 LAB — MICROALBUMIN / CREATININE URINE RATIO
Creatinine,U: 134.6 mg/dL
Microalb, Ur: 0.4 mg/dL (ref 0.0–1.9)

## 2012-10-25 ENCOUNTER — Encounter: Payer: Self-pay | Admitting: Internal Medicine

## 2012-10-27 ENCOUNTER — Encounter: Payer: Self-pay | Admitting: Women's Health

## 2012-10-27 ENCOUNTER — Ambulatory Visit (INDEPENDENT_AMBULATORY_CARE_PROVIDER_SITE_OTHER): Payer: BC Managed Care – PPO | Admitting: Women's Health

## 2012-10-27 ENCOUNTER — Other Ambulatory Visit (HOSPITAL_COMMUNITY)
Admission: RE | Admit: 2012-10-27 | Discharge: 2012-10-27 | Disposition: A | Discharge status: Home / Self Care | Payer: BC Managed Care – PPO | Source: Ambulatory Visit | Attending: Women's Health | Admitting: Women's Health

## 2012-10-27 VITALS — BP 134/88 | Ht 62.75 in | Wt 184.0 lb

## 2012-10-27 DIAGNOSIS — Z1151 Encounter for screening for human papillomavirus (HPV): Secondary | ICD-10-CM | POA: Insufficient documentation

## 2012-10-27 DIAGNOSIS — B373 Candidiasis of vulva and vagina: Secondary | ICD-10-CM

## 2012-10-27 DIAGNOSIS — Z01419 Encounter for gynecological examination (general) (routine) without abnormal findings: Secondary | ICD-10-CM | POA: Insufficient documentation

## 2012-10-27 MED ORDER — NYSTATIN 100000 UNIT/GM EX CREA: TOPICAL_CREAM | Freq: Two times a day (BID) | CUTANEOUS | Status: DC

## 2012-10-27 NOTE — Progress Notes (Addendum)
KALYNNE WOMAC July 20, 1946 [id number]    History:    The patient presents for brest and pelvic exam. Without complaint. Postmenopausal with no bleeding on no HRT. History of normal Paps and mammograms. Had tubulovillous adenoma polyp on colonoscopy in 2007. Labs at primary care. Hemoglobin A1c 6.9 10/2012 has followup with Dr. Talmage Nap January 2014.   Past medical history, past surgical history, family history and social history were all reviewed and documented in the EPIC chart. Continues to have problems with afternoon reactive hypoglycemia preventing her from leaving the house in the afternoon does/ travel. Husband works part time.   ROS:  A  ROS was performed and pertinent positives and negatives are included in the history.  Exam:  Filed Vitals:   10/27/12 0958  BP: 134/88    General appearance:  Normal Head/Neck:  Normal, without cervical or supraclavicular adenopathy. Thyroid:  Symmetrical, normal in size, without palpable masses or nodularity. Respiratory  Effort:  Normal  Auscultation:  Clear without wheezing or rhonchi Cardiovascular  Auscultation:  Regular rate, without rubs, murmurs or gallops  Edema/varicosities:  Not grossly evident Abdominal  Soft,nontender, without masses, guarding or rebound.  Liver/spleen:  No organomegaly noted  Hernia:  None appreciated  Skin  Inspection:  Grossly normal  Palpation:  Grossly normal Neurologic/psychiatric  Orientation:  Normal with appropriate conversation.  Mood/affect:  Normal  Genitourinary    Breasts: Examined lying and sitting.     Right: Without masses, retractions, discharge or axillary adenopathy.     Left: Without masses, retractions, discharge or axillary adenopathy.   Inguinal/mons:  Normal without inguinal adenopathy  External genitalia:  Normal  BUS/Urethra/Skene's glands:  Normal  Bladder:  Normal  Vagina:  Normal/dryness  Cervix:  Normal  Uterus:   normal in size, shape and contour.  Midline and  mobile  Adnexa/parametria:     Rt: Without masses or tenderness.   Lt: Without masses or tenderness.  Anus and perineum: Normal  Digital rectal exam: Normal sphincter tone without palpated masses or tenderness  Assessment/Plan:  66 y.o. M. WF G2 P2 for brest and pelvic exam without complaint.   Normal GYN exam/ postmenopausal Hyperlipidemia/reactive hypoglycemia/GERD-labs and meds primary care  Plan: Reviewed importance of increasing regular exercise, decreasing calories for abdominal weight loss. Vitamin D 2000 daily,  fish oil supplement, SBE's and continue annual mammograms encouraged. Has a bone density scheduled through primary care. Home safety and fall prevention discussed. Had a fractured leg and ankle from a traumatic fall several years ago. Colonoscopy due, instructed to schedule. Pap only today, history of normal Pap 2011. Uses nystatin cream as needed externally for itching, prescription, proper use given and reviewed. Denies any problems currently.    Harrington Challenger WHNP, 11:10 AM 10/27/2012

## 2012-10-27 NOTE — Patient Instructions (Signed)
Vit d 2000 daily Fish oil supplement Health Recommendations for Postmenopausal Women Based on the Results of the Women's Health Initiative [facility]) and Other Studies The WHI is a major 15-year research program to address the most common causes of death, disability and poor quality of life in postmenopausal women. Some of these causes are heart disease, cancer, bone loss (osteoporosis) and others. Taking into account all of the findings from [facility] and other studies, here are bottom-line health recommendations for women: CARDIOVASCULAR DISEASE Heart Disease: A heart attack is a medical emergency. Know the signs and symptoms of a heart attack. Hormone therapy should not be used to prevent heart disease. In women with heart disease, hormone therapy should not be used to prevent further disease. Hormone therapy increases the risk of blood clots. Below are things women can do to reduce their risk for heart disease.   Do not smoke. If you smoke, quit. Women who smoke are 2 to 6 times more likely to suffer a heart attack than non-smoking women.  Aim for a healthy weight. Being overweight causes many preventable deaths. Eat a healthy and balanced diet and drink an adequate amount of liquids.  Get moving. Make a commitment to be more physically active. Aim for 30 minutes of activity on most, if not all days of the week.  Eat for heart health. Choose a diet that is low in saturated fat, trans fat, and cholesterol. Include whole grains, vegetables, and fruits. Read the labels on the food container before buying it.  Know your numbers. Ask your caregiver to check your blood pressure, cholesterol (total, HDL, LDL, triglycerides) and blood glucose. Work with your caregiver to improve any numbers that are not normal.  High blood pressure. Limit or stop your table salt intake (try salt substitute and food seasonings), avoid salty foods and drinks. Read the labels on the food container before buying it. Avoid becoming  overweight by eating well and exercising. STROKE  Stroke is a medical emergency. Stroke can be the result of a blood clot in the blood vessel in the brain or by a brain hemorrhage (bleeding). Know the signs and symptoms of a stroke. To lower the risk of developing a stroke:  Avoid fatty foods.  Quit smoking.  Control your diabetes, blood pressure, and irregular heart rate. THROMBOPHLIBITIS (BLOOD CLOT) OF THE LEG  Hormone treatment is a big cause of developing blood clots in the leg. Becoming overweight and leading a stationary lifestyle also may contribute to developing blood clots. Controlling your diet and exercising will help lower the risk of developing blood clots. CANCER SCREENING  Breast Cancer: Women should take steps to reduce their risk of breast cancer. This includes having regular mammograms, monthly self breast exams and regular breast exams by your caregiver. Have a mammogram every one to two years if you are 100 to 67 years old. Have a mammogram annually if you are 34 years old or older depending on your risk factors. Women who are high risk for breast cancer may need more frequent mammograms. There are tests available (testing the genes in your body) if you have family history of breast cancer called BRCA 1 and 2. These tests can help determine the risks of developing breast cancer.  Intestinal or Stomach Cancer: Women should talk to their caregiver about when to start screening, what tests and how often they should be done, and the benefits and risks of doing these tests. Tests to consider are a rectal exam, fecal occult blood, sigmoidoscopy,  colononoscoby, barium enema and upper GI series of the stomach. Depending on the age, you may want to get a medical and family history of colon cancer. Women who are high risk may need to be screened at an earlier age and more often.  Cervical Cancer: A Pap test of the cervix should be done every year and every 3 years when there has been three  straight years of a normal Pap test. Women with an abnormal Pap test should be screened more often or have a cervical biopsy depending on your caregiver's recommendation.  Uterine Cancer: If you have vaginal bleeding after you are in the menopause, it should be evaluated by your caregiver.  Ovarian cancer: There are no reliable tests available to screen for ovarian cancer at this time except for yearly pelvic exams.  Lung Cancer: Yearly chest X-rays can detect lung cancer and should be done on high risk women, such as cigarette smokers and women with chronic lung disease (emphysemia).  Skin Cancer: A complete body skin exam should be done at your yearly examination. Avoid overexposure to the sun and ultraviolet light lamps. Use a strong sun block cream when in the sun. All of these things are important in lowering the risk of skin cancer. MENOPAUSE Menopause Symptoms: Hormone therapy products are effective for treating symptoms associated with menopause:  Moderate to severe hot flashes.  Night sweats.  Mood swings.  Headaches.  Tiredness.  Loss of sex drive.  Insomnia.  Other symptoms. However, hormone therapy products carry serious risks, especially in older women. Women who use or are thinking about using estrogen or estrogen with progestin treatments should discuss that with their caregiver. Your caregiver will know if the benefits outweigh the risks. The Food and Drug Administration (FDA) has concluded that hormone therapy should be used only at the lowest doses and for the shortest amount of time to reach treatment goals. It is not known at what doses there may be less risk of serious side effects. There are other treatments such as herbal medication (not controlled or regulated by the FDA), group therapy, counseling and acupuncture that may be helpful. OSTEOPOROSIS Protecting Against Bone Loss and Preventing Fracture: If hormone therapy is used for prevention of bone loss  (osteoporosis), the risks for bone loss must outweigh the risk of the therapy. Women considering taking hormone therapy for bone loss should ask their health care providers about other medications (fosamax and boniva) that are considered safe and effective for preventing bone loss and bone fractures. To guard against bone loss or fractures, it is recommended that women should take at least 1000-1500 mg of calcium and 400-800 IU of vitamin D daily in divided doses. Smoking and excessive alcohol intake increases the risk of osteoporosis. Eat foods rich in calcium and vitamin D and do weight bearing exercises several times a week as your caregiver suggests. DIABETES Diabetes Melitus: Women with Type I or Type 2 diabetes should keep their diabetes in control with diet, exercise and medication. Avoid too many sweets, starchy and fatty foods. Being overweight can affect your diabetes. COGNITION AND MEMORY Cognition and Memory: Menopausal hormone therapy is not recommended for the prevention of cognitive disorders such as Alzheimer's disease or memory loss. WHI found that women treated with hormone therapy have a greater risk of developing dementia.  DEPRESSION  Depression may occur at any age, but is common in elderly women. The reasons may be because of physical, medical, social (loneliness), financial and/or economic problems and needs. Becoming  involved with church, volunteer or social groups, seeking treatment for any physical or medical problems is recommended. Also, look into getting professional advice for any economic or financial problems. ACCIDENTS  Accidents are common and can be serious in the elderly woman. Prepare your house to prevent accidents. Eliminate throw rugs, use hip protectors, place hand bars in the bath, shower and toilet areas. Avoid wearing high heel shoes and walking on wet, snowy and icy areas. Stop driving if you have vision, hearing problems or are unsteady with you movements and  reflexes. RHEUMATOID ARTHRITIS Rheumatoid arthritis causes pain, swelling and stiffness of your bone joints. It can limit many of your activities. Over-the-counter medications may help, but prescription medications may be necessary. Talk with your caregiver about this. Exercise (walking, water aerobics), good posture, using splints on painful joints, warm baths or applying warm compresses to stiff joints and cold compresses to painful joints may be helpful. Smoking and excessive drinking may worsen the symptoms of arthritis. Seek help from a physical therapist if the arthritis is becoming a problem with your daily activities. IMMUNIZATIONS  Several immunizations are important to have during your senior years, including:   Tetanus and a diptheria shot booster every 10 years.  Influenza every year before the flu season begins.  Pneumonia vaccine.  Shingles vaccine.  Others as indicated (example: H1N1 vaccine). Document Released: 12/26/2005 Document Revised: 01/26/2012 Document Reviewed: 08/21/2008 [place] Patient Information 2013 [place], [place].

## 2012-11-02 ENCOUNTER — Encounter: Payer: Self-pay | Admitting: Obstetrics and Gynecology

## 2012-11-05 ENCOUNTER — Ambulatory Visit (INDEPENDENT_AMBULATORY_CARE_PROVIDER_SITE_OTHER)
Admission: RE | Admit: 2012-11-05 | Discharge: 2012-11-05 | Disposition: A | Discharge status: Home / Self Care | Payer: BC Managed Care – PPO | Source: Ambulatory Visit

## 2012-11-05 DIAGNOSIS — N959 Unspecified menopausal and perimenopausal disorder: Secondary | ICD-10-CM

## 2012-11-08 ENCOUNTER — Encounter: Payer: Self-pay | Admitting: Internal Medicine

## 2012-11-08 NOTE — Telephone Encounter (Deleted)
The Tdap is given once between the ages of 11-65 yrs of age with a Td Booster every 10 years thereafter. The pneumonia vaccine is also 1 dose between the ages of 19-65 years, with a booster dose after the age of 36.   The last documented does for you were Td 2011 and Pneumonia vaccine in 2008, you are good on the Td and can discuss the need for a pnuemonia vaccine with Dr. Alwyn Ren at your next appointment.   Merry Christmas!

## 2012-11-12 ENCOUNTER — Encounter: Payer: Self-pay | Admitting: Women's Health

## 2012-11-25 ENCOUNTER — Encounter: Payer: Self-pay | Admitting: Gastroenterology

## 2012-11-25 ENCOUNTER — Ambulatory Visit (INDEPENDENT_AMBULATORY_CARE_PROVIDER_SITE_OTHER): Payer: BC Managed Care – PPO | Admitting: Gastroenterology

## 2012-11-25 VITALS — BP 110/80 | HR 60 | Ht 62.75 in | Wt 187.4 lb

## 2012-11-25 DIAGNOSIS — Z860100 Personal history of colon polyps, unspecified: Secondary | ICD-10-CM

## 2012-11-25 DIAGNOSIS — K219 Gastro-esophageal reflux disease without esophagitis: Secondary | ICD-10-CM

## 2012-11-25 DIAGNOSIS — K3184 Gastroparesis: Secondary | ICD-10-CM

## 2012-11-25 MED ORDER — AMBULATORY NON FORMULARY MEDICATION: Status: DC

## 2012-11-25 MED ORDER — HYOSCYAMINE SULFATE 0.125 MG SL SUBL: SUBLINGUAL_TABLET | SUBLINGUAL | Status: DC

## 2012-11-25 NOTE — Patient Instructions (Addendum)
We have printed the prescription of Domperidone for you to compare prices through [place] and [employer] [facility]. The phone number is [phone] and fax number is [phone]. Please let us know if you want it sent from our office or if you decided to fax the prescription.   We have sent the following medications to your pharmacy for you to pick up at your convenience: Hyoscyamine.

## 2012-11-25 NOTE — Progress Notes (Signed)
History of Present Illness: This is a 67 year old female who has gastric peristalsis and GERD. Symptoms are well-controlled on her current medications. She occasionally has breakthrough symptoms and takes a second Nexium in the evening. She continues to have episodes of hypoglycemia associated with abdominal cramping and diarrhea. The symptoms have improved over the past few months. Denies weight loss, constipation, change in stool caliber, melena, hematochezia, nausea, vomiting, dysphagia, chest pain.  Current Medications, Allergies, Past Medical History, Past Surgical History, Family History and Social History were reviewed in [name].  Physical Exam: General: Well developed , well nourished, no acute distress Head: Normocephalic and atraumatic Eyes:  sclerae anicteric, EOMI Ears: Normal auditory acuity Mouth: No deformity or lesions Lungs: Clear throughout to auscultation Heart: Regular rate and rhythm; no murmurs, rubs or bruits Abdomen: Soft, non tender and non distended. No masses, hepatosplenomegaly or hernias noted. Normal Bowel sounds Musculoskeletal: Symmetrical with no gross deformities  Pulses:  Normal pulses noted Extremities: No clubbing, cyanosis, edema or deformities noted Neurological: Alert oriented x 4, grossly nonfocal Psychological:  Alert and cooperative. Normal mood and affect  Assessment and Recommendations:  1: DIARRHEA AND LOWER ABDOMINAL PAIN associated with hypoglycemia. Levsin as needed and Imodium twice a day as needed.   2: GASTROPARESIS. Symptoms currently under good control. Continue standard dietary measures. Continue domperidone.   3: GASTROESOPHAGEAL REFLUX DISEASE. Continue Nexium 40 mg q.a.m. and standard antireflux measures. May use Prilosec OTC 20 mg in the evening for breakthrough symptoms.  4: HX OF TUBULOVILLOUS ADENOMA, COLON. Surveillance colonoscopy recommended in December 2014.

## 2012-12-07 ENCOUNTER — Other Ambulatory Visit: Payer: Self-pay | Admitting: Internal Medicine

## 2012-12-20 ENCOUNTER — Encounter: Payer: Self-pay | Admitting: Internal Medicine

## 2013-01-06 ENCOUNTER — Other Ambulatory Visit: Payer: Self-pay | Admitting: Internal Medicine

## 2013-01-11 ENCOUNTER — Encounter: Payer: Self-pay | Admitting: Internal Medicine

## 2013-03-05 ENCOUNTER — Other Ambulatory Visit: Payer: Self-pay | Admitting: Internal Medicine

## 2013-03-21 ENCOUNTER — Encounter: Payer: Self-pay | Admitting: Internal Medicine

## 2013-03-21 ENCOUNTER — Telehealth: Payer: Self-pay | Admitting: Internal Medicine

## 2013-03-21 ENCOUNTER — Other Ambulatory Visit: Payer: Self-pay | Admitting: Internal Medicine

## 2013-03-21 ENCOUNTER — Encounter: Payer: Self-pay | Admitting: Gastroenterology

## 2013-03-21 DIAGNOSIS — K3184 Gastroparesis: Secondary | ICD-10-CM

## 2013-03-21 DIAGNOSIS — Z86010 Personal history of colonic polyps: Secondary | ICD-10-CM

## 2013-03-21 DIAGNOSIS — E162 Hypoglycemia, unspecified: Secondary | ICD-10-CM

## 2013-03-21 DIAGNOSIS — K219 Gastro-esophageal reflux disease without esophagitis: Secondary | ICD-10-CM

## 2013-03-21 NOTE — Telephone Encounter (Signed)
Patient called requesting to speak with referral coordinator. We are not currently working on a referral for her. Pt is requesting that we send recent records to Dr. Kinnie Scales regarding her GI problems and blood sugar drops. I advised patient that I would be capable of handling this for her. She would like Dr. Alwyn Ren to know that she is going to see Dr. Kinnie Scales.

## 2013-03-21 NOTE — Telephone Encounter (Signed)
Pt was called back and offered an OV since last appt was on 10/2012 and A1c levels were elevated at that time, which she declined. Pt states that these GI and sugar issues have persisted for over 5 years. Would like to be referred to Dr. Kinnie Scales for these issues. Pt states that she is still seeing Dr. Russella Dar for Gastro symptoms. Please advise.

## 2013-04-05 ENCOUNTER — Other Ambulatory Visit: Payer: Self-pay | Admitting: Internal Medicine

## 2013-06-22 ENCOUNTER — Other Ambulatory Visit: Payer: Self-pay

## 2013-07-13 ENCOUNTER — Encounter: Payer: Self-pay | Admitting: Internal Medicine

## 2013-08-01 ENCOUNTER — Encounter: Payer: Self-pay | Admitting: Internal Medicine

## 2013-08-17 ENCOUNTER — Encounter: Payer: Self-pay | Admitting: Gastroenterology

## 2013-09-22 ENCOUNTER — Other Ambulatory Visit: Payer: Self-pay

## 2013-10-06 ENCOUNTER — Encounter: Payer: Self-pay | Admitting: Internal Medicine

## 2013-10-06 ENCOUNTER — Ambulatory Visit (INDEPENDENT_AMBULATORY_CARE_PROVIDER_SITE_OTHER): Payer: BC Managed Care – PPO | Admitting: Internal Medicine

## 2013-10-06 VITALS — BP 162/80 | HR 86 | Temp 98.7°F | Ht 63.25 in | Wt 185.0 lb

## 2013-10-06 DIAGNOSIS — Z86007 Personal history of in-situ neoplasm of skin: Secondary | ICD-10-CM | POA: Insufficient documentation

## 2013-10-06 DIAGNOSIS — C4492 Squamous cell carcinoma of skin, unspecified: Secondary | ICD-10-CM | POA: Insufficient documentation

## 2013-10-06 DIAGNOSIS — R1032 Left lower quadrant pain: Secondary | ICD-10-CM

## 2013-10-06 NOTE — Progress Notes (Signed)
  Subjective:    Patient ID: Hannah Bryant, female    DOB: 1946/03/31, 67 y.o.   MRN: [id number]  HPI  Symptoms began approximately one month ago as abdominal discomfort left lower quadrant. Initially she thought she strained herself at the gym. The discomfort was intermittent , lasting minutes to hours and minimally responsive Tylenol.  It has continued to recur intermittently and described as dull and cramping up to level V-6. It may have radiated slightly inferiorly and laterally to its initial location.  She continues to have dyspepsia.With the dyspepsia she has intermittently been experiencing dysphagia.   She has one & 1/2 half cups of coffee in the morning. She has one to 2 alcoholic beverages a week  She has  had  adenomatous polyp and hyperplastic polyps in the past. Her colonoscopy was negative in 2009. She's due for followup colonoscopy in January 2015. Dr. Russella Dar has performed esophageal dilation x1.      Review of Systems  She specifically denies nausea, vomiting, constipation, diarrhea, melena, or rectal bleeding.  She also has not had hematemesis. Her weight has been stable. There is no associated constitutional symptoms of fever, chills, or sweats  No dysuria, pyuria, hematuria was reported.  She has not noted any rash or change in color or temperature of skin in the area of the discomfort.   The pain is not associated with any back pain with an anterior reticular quality.     Objective:   Physical Exam General appearance is one of good health and nourishment w/o distress.  Eyes: No conjunctival inflammation or scleral icterus is present.  Oral exam: Dental hygiene is good; lips and gums are healthy appearing.There is no oropharyngeal erythema or exudate noted.   Heart:  Normal rate and regular rhythm. S1 and S2 normal without gallop, murmur, click, rub or other extra sounds     Lungs:Chest clear to auscultation; no wheezes, rhonchi,rales ,or rubs present.No  increased work of breathing.   Abdomen: bowel sounds gurgling, soft and non-tender without masses, organomegaly or hernias noted.  No guarding or rebound . No tenderness over the flanks to percussion  Musculoskeletal: Able to lie flat and sit up without help. Negative straight leg raising bilaterally. Gait normal  Skin:Warm & dry.  Intact without suspicious lesions or rashes ; no jaundice or tenting  Lymphatic: No lymphadenopathy is noted about the head, neck, axilla.                Assessment & Plan:  #1 LLQ pain See orders

## 2013-10-06 NOTE — Progress Notes (Signed)
Pre visit review using our clinic review tool, if applicable. No additional management support is needed unless otherwise documented below in the visit note. 

## 2013-10-06 NOTE — Patient Instructions (Signed)
Your next office appointment will be determined based upon review of your pending labs . Those instructions will be transmitted to you through My Chart.  Please report any significant change in your symptoms. Please complete and return stool cards; these will determine whether there is any gastrointestinal bleeding risk. Levsin SL every 4 hrs as needed.

## 2013-10-07 LAB — CBC WITH DIFFERENTIAL/PLATELET
Basophils Absolute: 0 10*3/uL (ref 0.0–0.1)
Basophils Relative: 0.4 % (ref 0.0–3.0)
Eosinophils Absolute: 0.1 10*3/uL (ref 0.0–0.7)
HCT: 40.1 % (ref 36.0–46.0)
Hemoglobin: 13.5 g/dL (ref 12.0–15.0)
Lymphocytes Relative: 31.5 % (ref 12.0–46.0)
Lymphs Abs: 2.8 10*3/uL (ref 0.7–4.0)
MCHC: 33.6 g/dL (ref 30.0–36.0)
MCV: 93.3 fl (ref 78.0–100.0)
Monocytes Absolute: 0.6 10*3/uL (ref 0.1–1.0)
Neutro Abs: 5.4 10*3/uL (ref 1.4–7.7)
RBC: 4.3 Mil/uL (ref 3.87–5.11)
RDW: 13.4 % (ref 11.5–14.6)

## 2013-10-07 LAB — POCT URINALYSIS DIPSTICK
Blood, UA: NEGATIVE
Glucose, UA: NEGATIVE
Nitrite, UA: NEGATIVE
Protein, UA: NEGATIVE
Spec Grav, UA: >=1.03
Urobilinogen, UA: 0.2
pH, UA: 6

## 2013-10-09 ENCOUNTER — Encounter: Payer: Self-pay | Admitting: Internal Medicine

## 2013-10-11 ENCOUNTER — Other Ambulatory Visit: Payer: Self-pay | Admitting: Internal Medicine

## 2013-10-11 ENCOUNTER — Telehealth: Payer: Self-pay

## 2013-10-11 DIAGNOSIS — R1032 Left lower quadrant pain: Secondary | ICD-10-CM

## 2013-10-11 NOTE — Telephone Encounter (Signed)
Patient decided to proceed with appointment. Appt scheduled w/Dr. Russella Dar for 10/28/13. She will cancel if she feels she does not need it.

## 2013-10-11 NOTE — Telephone Encounter (Signed)
[facility] records in 2014 reviewed in Media tab. Domperidone was stopped and she felt better.  She has appt with me in Dec. This is ok.

## 2013-10-11 NOTE — Telephone Encounter (Signed)
I have contacted the patient about scheduling a colonoscopy rather than an office visit 10/28/13.  She dose not want to schedule a colon directly.  She has also seen Dr. Kinnie Scales earlier this year.  Patient became upset when I asked her about getting records from Dr. Kinnie Scales for her upcoming visit with Dr. Russella Dar on 12/12. The patient stated  " I do not understand what the difference is, I only saw him once and he didn't do anything".  I have explained that it is important to have all her records for continuity of care.  Patient actually saw Dr. Kinnie Scales on 03/30/13 and 05/30/13.  Dr. Russella Dar the records are under Media tab.  Please review and advise if ok to keep the appt for 10/28/13 with you.  She "intends to keep" the appt with you on 10/28/13

## 2013-10-11 NOTE — Telephone Encounter (Signed)
Message copied by Annett Fabian on Tue Oct 11, 2013  3:24 PM ------      Message from: Claudette Head T      Created: Tue Oct 11, 2013 12:39 PM       I have a pt email sent from [facility] on my desktop. She appears to be due for colonoscopy in 10/2013. Please help her schedule, respond to and remove the pt email from my inbox (I am not sure how to do this or how to forward the message). Thx. ------

## 2013-10-11 NOTE — Telephone Encounter (Signed)
Called pt to schedule GI appt. She states she is feeling better and has had no pain yesterday or so far today. She states she has doubled her nexium and is taking a supplement called Suprema Dophilus. She would like to know if she can wait to see Dr. Russella Dar in 2015 for her colonoscopy. She states she will let us know if the pain comes back. Please advise patient on instructions.

## 2013-10-25 ENCOUNTER — Encounter: Payer: BC Managed Care – PPO | Admitting: Internal Medicine

## 2013-10-28 ENCOUNTER — Encounter: Payer: BC Managed Care – PPO | Admitting: Internal Medicine

## 2013-10-28 ENCOUNTER — Other Ambulatory Visit (INDEPENDENT_AMBULATORY_CARE_PROVIDER_SITE_OTHER): Payer: BC Managed Care – PPO

## 2013-10-28 ENCOUNTER — Ambulatory Visit (INDEPENDENT_AMBULATORY_CARE_PROVIDER_SITE_OTHER): Payer: BC Managed Care – PPO | Admitting: Gastroenterology

## 2013-10-28 ENCOUNTER — Encounter: Payer: Self-pay | Admitting: Gastroenterology

## 2013-10-28 VITALS — BP 122/80 | HR 80 | Ht 62.0 in | Wt 184.5 lb

## 2013-10-28 DIAGNOSIS — R1032 Left lower quadrant pain: Secondary | ICD-10-CM

## 2013-10-28 DIAGNOSIS — K219 Gastro-esophageal reflux disease without esophagitis: Secondary | ICD-10-CM

## 2013-10-28 DIAGNOSIS — Z86010 Personal history of colonic polyps: Secondary | ICD-10-CM

## 2013-10-28 DIAGNOSIS — K3184 Gastroparesis: Secondary | ICD-10-CM

## 2013-10-28 LAB — POC HEMOCCULT BLD/STL (HOME/3-CARD/SCREEN)

## 2013-10-28 MED ORDER — SOD PICOSULFATE-MAG OX-CIT ACD 10-3.5-12 MG-GM-GM PO PACK: 1.0000 | PACK | ORAL | Status: DC

## 2013-10-28 MED ORDER — ESOMEPRAZOLE MAGNESIUM 40 MG PO CPDR: 40.0000 mg | DELAYED_RELEASE_CAPSULE | Freq: Every day | ORAL | Status: DC

## 2013-10-28 MED ORDER — HYOSCYAMINE SULFATE 0.125 MG SL SUBL: SUBLINGUAL_TABLET | SUBLINGUAL | Status: DC

## 2013-10-28 NOTE — Patient Instructions (Signed)
You have been scheduled for a colonoscopy with propofol. Please follow written instructions given to you at your visit today.  Please pick up your prep kit at the pharmacy within the next 1-3 days. If you use inhalers (even only as needed), please bring them with you on the day of your procedure. Your physician has requested that you go to www.startemmi.com and enter the access code given to you at your visit today. This web site gives a general overview about your procedure. However, you should still follow specific instructions given to you by our office regarding your preparation for the procedure.  We have sent the following medications to your pharmacy for you to pick up at your convenience: Nexium.  Thank you for choosing me and [facility].  Venita Lick. Pleas Koch., MD., Clementeen Graham

## 2013-10-28 NOTE — Progress Notes (Addendum)
    History of Present Illness: This is a 67 year old female followed with GERD, gastroparesis and adenomatous colon polyps. She changed gastroenterologists to Dr. Kinnie Bryant earlier this year and now is returning to my care. I reviewed records from Dr. Kinnie Bryant from April and July. She had left lower quadrant crampy abdominal pain that was felt to be secondary to domperidone which was discontinued. Her symptoms persisted and she began taking a daily probiotic, increased Nexium to 40 mg daily and her left lower quadrant pain has resolved. She has ongoing problems with hypoglycemia followed by Dr. Lisabeth Bryant.  Current Medications, Allergies, Past Medical History, Past Surgical History, Family History and Social History were reviewed in [name].  Physical Exam: General: Well developed , well nourished, no acute distress Head: Normocephalic and atraumatic Eyes:  sclerae anicteric, EOMI Ears: Normal auditory acuity Mouth: No deformity or lesions Lungs: Clear throughout to auscultation Heart: Regular rate and rhythm; no murmurs, rubs or bruits Abdomen: Soft, non tender and non distended. No masses, hepatosplenomegaly or hernias noted. Normal Bowel sounds Rectal: deferred to colonoscopy Musculoskeletal: Symmetrical with no gross deformities  Pulses:  Normal pulses noted Extremities: No clubbing, cyanosis, edema or deformities noted Neurological: Alert oriented x 4, grossly nonfocal Psychological:  Alert and cooperative. Normal mood and affect  Assessment and Recommendations:  1. LLQ pain, exacerbated by domperidone. Continue daily probiotic. Hyoscyamine when necessary.  2. Personal history of adenomatous colon polyps. She is due for a five-year surveillance colonoscopy. The risks, benefits, and alternatives to colonoscopy with possible biopsy and possible polypectomy were discussed with the patient and they consent to proceed.   3. GERD. Continue standard antireflux  measures and Nexium 40 mg daily.  4. Gastroparesis. Currently asymptomatic off domperidone. Continue dietary modifications.

## 2013-11-04 ENCOUNTER — Other Ambulatory Visit: Payer: Self-pay

## 2013-11-04 MED ORDER — HYOSCYAMINE SULFATE 0.125 MG SL SUBL: SUBLINGUAL_TABLET | SUBLINGUAL | Status: DC

## 2013-11-20 ENCOUNTER — Other Ambulatory Visit: Payer: Self-pay | Admitting: Internal Medicine

## 2013-11-21 ENCOUNTER — Encounter: Payer: Self-pay | Admitting: Gastroenterology

## 2013-11-21 NOTE — Telephone Encounter (Signed)
Atorvastatin refilled per protocol. JG//CMA 

## 2013-11-23 ENCOUNTER — Telehealth: Payer: Self-pay

## 2013-11-23 NOTE — Telephone Encounter (Addendum)
Left message for call back Non identifiable Medication List and allergies:  Reviewed and updated  90 day supply/mail order: na Local prescriptions: [facility] on [address]  Immunizations due: second PNA vaccine  A/P:   No changes to [place] or PSH or personal hx Female care--Dr Young--10/2012 [facility]--10/2012--neg Dexa--10/2012 [facility]--10/2008--Dr Stark--benign polyps--scheduled for 01/2014 Flu vaccine--08/2013 Tdap--10/2010 Shingles--11/2007  To Discuss with Provider: Hypoglycemic drops in afternoon PNA vaccine

## 2013-11-24 ENCOUNTER — Ambulatory Visit (INDEPENDENT_AMBULATORY_CARE_PROVIDER_SITE_OTHER): Payer: Managed Care, Other (non HMO) | Admitting: Internal Medicine

## 2013-11-24 ENCOUNTER — Encounter: Payer: Self-pay | Admitting: Internal Medicine

## 2013-11-24 VITALS — BP 159/74 | HR 57 | Temp 98.4°F | Ht 63.5 in | Wt 184.4 lb

## 2013-11-24 DIAGNOSIS — N390 Urinary tract infection, site not specified: Secondary | ICD-10-CM

## 2013-11-24 DIAGNOSIS — E119 Type 2 diabetes mellitus without complications: Secondary | ICD-10-CM

## 2013-11-24 DIAGNOSIS — K3184 Gastroparesis: Secondary | ICD-10-CM

## 2013-11-24 DIAGNOSIS — Z23 Encounter for immunization: Secondary | ICD-10-CM

## 2013-11-24 DIAGNOSIS — R946 Abnormal results of thyroid function studies: Secondary | ICD-10-CM

## 2013-11-24 DIAGNOSIS — Z0000 Encounter for general adult medical examination without abnormal findings: Secondary | ICD-10-CM

## 2013-11-24 DIAGNOSIS — Z86010 Personal history of colonic polyps: Secondary | ICD-10-CM

## 2013-11-24 DIAGNOSIS — E785 Hyperlipidemia, unspecified: Secondary | ICD-10-CM

## 2013-11-24 LAB — POCT URINALYSIS DIPSTICK
Bilirubin, UA: NEGATIVE
Glucose, UA: NEGATIVE
KETONES UA: NEGATIVE
Nitrite, UA: NEGATIVE
PROTEIN UA: NEGATIVE
Spec Grav, UA: 1.015
Urobilinogen, UA: 0.2
pH, UA: 6.5

## 2013-11-24 NOTE — Progress Notes (Signed)
Pre visit review using our clinic review tool, if applicable. No additional management support is needed unless otherwise documented below in the visit note. 

## 2013-11-24 NOTE — Progress Notes (Signed)
   Subjective:    Patient ID: Hannah Bryant, female    DOB: [birth year], 68 y.o.   MRN: [id number]  HPI She  is here for a physical;acute issues intermittent aching discomfort in the LLQ.It can last all day. No triggers or relievers except direct pressure decreases.No bulge noted to date.     Review of Systems She denies dyspepsia, unexplained weight loss,  melena, rectal bleeding, or small caliber stools. Occasional dysphagia. Colonoscopy pending 3/15. Dr Chalmers Cater monitors diabetes.     Objective:   Physical Exam  Gen.:  well-nourished in appearance. Central weight excess.Alert, appropriate and cooperative throughout exam.Appears younger than stated age  Head: Normocephalic without obvious abnormalities  Eyes: No corneal or conjunctival inflammation noted. Pupils equal round reactive to light and accommodation. Extraocular motion intact.  Ears: External  ear exam reveals no significant lesions or deformities. Canals clear .TMs normal. Hearing is grossly normal bilaterally. Nose: External nasal exam reveals no deformity or inflammation. Nasal mucosa are pink and moist. No lesions or exudates noted.  Mouth: Oral mucosa and oropharynx reveal no lesions or exudates. Teeth in good repair. Neck: No deformities, masses, or tenderness noted. Range of motion &. Thyroid normal. Lungs: Normal respiratory effort; chest expands symmetrically. Lungs are clear to auscultation without rales, wheezes, or increased work of breathing. Heart: Normal rate and rhythm. Accentuated  S1. No gallop, click, or rub. No murmur. Abdomen: Bowel sounds normal; abdomen soft and nontender. No masses, organomegaly or hernias noted. Genitalia: as per Gyn                                  Musculoskeletal/extremities:Accentuated curvature of upper thoracic spine. No clubbing, cyanosis, edema, or significant extremity  deformity noted. Range of motion normal .Tone & strength normal. Hand joints normal . Fingernail / toenail  health good. Able to lie down & sit up w/o help. Negative SLR bilaterally Vascular: Carotid, radial artery, dorsalis pedis and  posterior tibial pulses are full and equal. No bruits present. Neurologic: Alert and oriented x3. Deep tendon reflexes symmetrical and normal.  Light touch normal over feet      Skin: Intact without suspicious lesions or rashes. Lymph: No cervical, axillary lymphadenopathy present. Psych: Mood and affect are normal. Normally interactive                                                                                        Assessment & Plan:  #1 comprehensive physical exam; no acute findings #2 LLQ pain, chronic recurrent, as per Dr Fuller Plan  Plan: see Orders  & Recommendations

## 2013-11-24 NOTE — Patient Instructions (Signed)
Order for fasting labs entered into  the computer; these will be performed at [place]. across from [facility]. No appointment is necessary. Your next office appointment will be determined based upon review of your pending labs . Those instructions will be transmitted to you through My Chart  or by mail if you're not using this system.  Followup as needed for your this acute issue. Please report any significant change in your symptoms.

## 2013-11-25 ENCOUNTER — Telehealth: Payer: Self-pay

## 2013-11-25 NOTE — Telephone Encounter (Signed)
Relevant patient education assigned to patient using Emmi. ° °

## 2013-11-26 LAB — URINE CULTURE: Colony Count: >=100000

## 2013-12-01 ENCOUNTER — Ambulatory Visit (INDEPENDENT_AMBULATORY_CARE_PROVIDER_SITE_OTHER): Payer: Managed Care, Other (non HMO) | Admitting: Women's Health

## 2013-12-01 ENCOUNTER — Encounter: Payer: Self-pay | Admitting: Women's Health

## 2013-12-01 VITALS — BP 100/62 | Ht 63.0 in | Wt 183.4 lb

## 2013-12-01 DIAGNOSIS — Z01419 Encounter for gynecological examination (general) (routine) without abnormal findings: Secondary | ICD-10-CM

## 2013-12-01 DIAGNOSIS — R1032 Left lower quadrant pain: Secondary | ICD-10-CM

## 2013-12-01 DIAGNOSIS — B3731 Acute candidiasis of vulva and vagina: Secondary | ICD-10-CM

## 2013-12-01 DIAGNOSIS — R350 Frequency of micturition: Secondary | ICD-10-CM

## 2013-12-01 DIAGNOSIS — B373 Candidiasis of vulva and vagina: Secondary | ICD-10-CM

## 2013-12-01 LAB — URINALYSIS W MICROSCOPIC + REFLEX CULTURE
Bilirubin Urine: NEGATIVE
CASTS: NONE SEEN
CRYSTALS: NONE SEEN
Glucose, UA: NEGATIVE mg/dL
KETONES UR: NEGATIVE mg/dL
Leukocytes, UA: NEGATIVE
NITRITE: NEGATIVE
PH: 7 (ref 5.0–8.0)
Protein, ur: NEGATIVE mg/dL
SPECIFIC GRAVITY, URINE: 1.02 (ref 1.005–1.030)
Urobilinogen, UA: 0.2 mg/dL (ref 0.0–1.0)
WBC, UA: NONE SEEN WBC/hpf (ref <3)

## 2013-12-01 MED ORDER — FLUCONAZOLE 150 MG PO TABS: 150.0000 mg | ORAL_TABLET | Freq: Once | ORAL | Status: DC

## 2013-12-01 NOTE — Patient Instructions (Signed)
Health Recommendations for Postmenopausal Women Respected and ongoing research has looked at the most common causes of death, disability, and poor quality of life in postmenopausal women. The causes include heart disease, diseases of blood vessels, diabetes, depression, cancer, and bone loss (osteoporosis). Many things can be done to help lower the chances of developing these and other common problems: CARDIOVASCULAR DISEASE Heart Disease: A heart attack is a medical emergency. Know the signs and symptoms of a heart attack. Below are things women can do to reduce their risk for heart disease.   Do not smoke. If you smoke, quit.  Aim for a healthy weight. Being overweight causes many preventable deaths. Eat a healthy and balanced diet and drink an adequate amount of liquids.  Get moving. Make a commitment to be more physically active. Aim for 30 minutes of activity on most, if not all days of the week.  Eat for heart health. Choose a diet that is low in saturated fat and cholesterol and eliminate trans fat. Include whole grains, vegetables, and fruits. Read and understand the labels on food containers before buying.  Know your numbers. Ask your caregiver to check your blood pressure, cholesterol (total, HDL, LDL, triglycerides) and blood glucose. Work with your caregiver on improving your entire clinical picture.  High blood pressure. Limit or stop your table salt intake (try salt substitute and food seasonings). Avoid salty foods and drinks. Read labels on food containers before buying. Eating well and exercising can help control high blood pressure. STROKE  Stroke is a medical emergency. Stroke may be the result of a blood clot in a blood vessel in the brain or by a brain hemorrhage (bleeding). Know the signs and symptoms of a stroke. To lower the risk of developing a stroke:  Avoid fatty foods.  Quit smoking.  Control your diabetes, blood pressure, and irregular heart rate. THROMBOPHLEBITIS  (BLOOD CLOT) OF THE LEG  Becoming overweight and leading a stationary lifestyle may also contribute to developing blood clots. Controlling your diet and exercising will help lower the risk of developing blood clots. CANCER SCREENING  Breast Cancer: Take steps to reduce your risk of breast cancer.  You should practice "breast self-awareness." This means understanding the normal appearance and feel of your breasts and should include breast self-examination. Any changes detected, no matter how small, should be reported to your caregiver.  After age 40, you should have a clinical breast exam (CBE) every year.  Starting at age 40, you should consider having a mammogram (breast X-ray) every year.  If you have a family history of breast cancer, talk to your caregiver about genetic screening.  If you are at high risk for breast cancer, talk to your caregiver about having an MRI and a mammogram every year.  Intestinal or Stomach Cancer: Tests to consider are a rectal exam, fecal occult blood, sigmoidoscopy, and colonoscopy. Women who are high risk may need to be screened at an earlier age and more often.  Cervical Cancer:  Beginning at age 30, you should have a Pap test every 3 years as long as the past 3 Pap tests have been normal.  If you have had past treatment for cervical cancer or a condition that could lead to cancer, you need Pap tests and screening for cancer for at least 20 years after your treatment.  If you had a hysterectomy for a problem that was not cancer or a condition that could lead to cancer, then you no longer need Pap tests.    If you are between ages 65 and 70, and you have had normal Pap tests going back 10 years, you no longer need Pap tests.  If Pap tests have been discontinued, risk factors (such as a new sexual partner) need to be reassessed to determine if screening should be resumed.  Some medical problems can increase the chance of getting cervical cancer. In these  cases, your caregiver may recommend more frequent screening and Pap tests.  Uterine Cancer: If you have vaginal bleeding after reaching menopause, you should notify your caregiver.  Ovarian cancer: Other than yearly pelvic exams, there are no reliable tests available to screen for ovarian cancer at this time except for yearly pelvic exams.  Lung Cancer: Yearly chest X-rays can detect lung cancer and should be done on high risk women, such as cigarette smokers and women with chronic lung disease (emphysema).  Skin Cancer: A complete body skin exam should be done at your yearly examination. Avoid overexposure to the sun and ultraviolet light lamps. Use a strong sun block cream when in the sun. All of these things are important in lowering the risk of skin cancer. MENOPAUSE Menopause Symptoms: Hormone therapy products are effective for treating symptoms associated with menopause:  Moderate to severe hot flashes.  Night sweats.  Mood swings.  Headaches.  Tiredness.  Loss of sex drive.  Insomnia.  Other symptoms. Hormone replacement carries certain risks, especially in older women. Women who use or are thinking about using estrogen or estrogen with progestin treatments should discuss that with their caregiver. Your caregiver will help you understand the benefits and risks. The ideal dose of hormone replacement therapy is not known. The Food and Drug Administration (FDA) has concluded that hormone therapy should be used only at the lowest doses and for the shortest amount of time to reach treatment goals.  OSTEOPOROSIS Protecting Against Bone Loss and Preventing Fracture: If you use hormone therapy for prevention of bone loss (osteoporosis), the risks for bone loss must outweigh the risk of the therapy. Ask your caregiver about other medications known to be safe and effective for preventing bone loss and fractures. To guard against bone loss or fractures, the following is recommended:  If  you are less than age 50, take 1000 mg of calcium and at least 600 mg of Vitamin D per day.  If you are greater than age 50 but less than age 70, take 1200 mg of calcium and at least 600 mg of Vitamin D per day.  If you are greater than age 70, take 1200 mg of calcium and at least 800 mg of Vitamin D per day. Smoking and excessive alcohol intake increases the risk of osteoporosis. Eat foods rich in calcium and vitamin D and do weight bearing exercises several times a week as your caregiver suggests. DIABETES Diabetes Melitus: If you have Type I or Type 2 diabetes, you should keep your blood sugar under control with diet, exercise and recommended medication. Avoid too many sweets, starchy and fatty foods. Being overweight can make control more difficult. COGNITION AND MEMORY Cognition and Memory: Menopausal hormone therapy is not recommended for the prevention of cognitive disorders such as Alzheimer's disease or memory loss.  DEPRESSION  Depression may occur at any age, but is common in elderly women. The reasons may be because of physical, medical, social (loneliness), or financial problems and needs. If you are experiencing depression because of medical problems and control of symptoms, talk to your caregiver about this. Physical activity and   exercise may help with mood and sleep. Community and volunteer involvement may help your sense of value and worth. If you have depression and you feel that the problem is getting worse or becoming severe, talk to your caregiver about treatment options that are best for you. ACCIDENTS  Accidents are common and can be serious in the elderly woman. Prepare your house to prevent accidents. Eliminate throw rugs, place hand bars in the bath, shower and toilet areas. Avoid wearing high heeled shoes or walking on wet, snowy, and icy areas. Limit or stop driving if you have vision or hearing problems, or you feel you are unsteady with you movements and  reflexes. HEPATITIS C Hepatitis C is a type of viral infection affecting the liver. It is spread mainly through contact with blood from an infected person. It can be treated, but if left untreated, it can lead to severe liver damage over years. Many people who are infected do not know that the virus is in their blood. If you are a "baby-boomer", it is recommended that you have one screening test for Hepatitis C. IMMUNIZATIONS  Several immunizations are important to consider having during your senior years, including:   Tetanus, diptheria, and pertussis booster shot.  Influenza every year before the flu season begins.  Pneumonia vaccine.  Shingles vaccine.  Others as indicated based on your specific needs. Talk to your caregiver about these. Document Released: 12/26/2005 Document Revised: 10/20/2012 Document Reviewed: 08/21/2008 Exit[employer] Patient Information 2014 [employer].  

## 2013-12-01 NOTE — Progress Notes (Signed)
Hannah Bryant 06-17-1946 [id number]    History:    The patient presents for annual exam with left sided pain since October 2014. Postmenopausal/No HRT/no bleeding. Sexually active. History of normal Paps and mammograms. Had tubulovillous adenoma polyp on colonoscopy in 2007. Labs at primary care. History of reactive hypoglycemia and gastroporesis, manages with probiotics. DEXA 10/2012 T-score -1.6. History of  urinary frequency with negative urine cultures  Past medical history, past surgical history, family history and social history were all reviewed and documented in the EPIC chart. Retired, husband still working.  Two grown children, doing well, 2 grandchildren 8 and 5, doing well. Mother MI, 32. Father DM/HTN/Stroke/MI/ulcers.  ROS:  A  ROS was performed and pertinent positives and negatives are included in the history.  Exam:  Filed Vitals:   12/01/13 1101  BP: 100/62    General appearance:  Normal Head/Neck:  Normal, without cervical or supraclavicular adenopathy. Thyroid:  Symmetrical, normal in size, without palpable masses or nodularity. Respiratory  Effort:  Normal  Auscultation:  Clear without wheezing or rhonchi Cardiovascular  Auscultation:  Regular rate, without rubs, murmurs or gallops  Edema/varicosities:  Not grossly evident Abdominal  Soft,nontender, without masses, guarding or rebound.  Liver/spleen:  No organomegaly noted  Hernia:  None appreciated  Skin  Inspection:  Grossly normal  Palpation:  Grossly normal Neurologic/psychiatric  Orientation:  Normal with appropriate conversation.  Mood/affect:  Normal  Genitourinary    Breasts: Examined lying and sitting.     Right: Without masses, retractions, discharge or axillary adenopathy.     Left: Without masses, retractions, discharge or axillary adenopathy.   Inguinal/mons:  Normal without inguinal adenopathy  External genitalia:  Normal  BUS/Urethra/Skene's glands:  Normal  Bladder:  Normal  Vagina:   Atrophy, labial fusion  Cervix:  Normal  Uterus:  Normal in size, shape and contour.  Midline and mobile  Adnexa/parametria:     Rt: Without masses or tenderness.   Lt: Without masses or tenderness.  Anus and perineum: Asymptomatic Lichen sclerosis   Digital rectal exam: Normal sphincter tone without palpated masses or tenderness  Assessment/Plan:  68 y.o.  MWF G2P2 for annual exam with left side pain/4 inches from umbilicus for past month.  Postmenopausal/No HRT/no bleeding Hyperlipidemia/reactive hypoglycemia/GERD-labs and meds primary care Left sided abdominal Pain Osteopenia, managed at primary care Asymptomatic Yeast noted on UA  Plan: Transvaginal US scheduled.  Reviewed importance of increasing regular exercise, decreasing calories for abdominal weight loss. Vitamin D 2000 daily SBE's and continue annual mammograms encouraged. Has a bone density scheduled through primary care. Home safety and fall prevention discussed. Had a fractured leg and ankle from a traumatic fall several years ago. Colonoscopy schedule 01/2104. History of normal Pap 2011, new screening guidelines reviewed. Diflucan 150 by mouth times one dose, prescription, proper use given and reviewed. Urine culture pending.    Huel Cote [facility], 11:41 AM 12/01/2013

## 2013-12-02 ENCOUNTER — Telehealth: Payer: Self-pay | Admitting: *Deleted

## 2013-12-02 LAB — URINE CULTURE: Colony Count: 30000

## 2013-12-02 NOTE — Telephone Encounter (Signed)
PA FORMED FAXED TO [employer] FOR DIFLUCAN 150, WILL WAIT FOR APPROVAL FROM [employer].

## 2013-12-05 ENCOUNTER — Other Ambulatory Visit (INDEPENDENT_AMBULATORY_CARE_PROVIDER_SITE_OTHER): Payer: Managed Care, Other (non HMO)

## 2013-12-05 DIAGNOSIS — Z0000 Encounter for general adult medical examination without abnormal findings: Secondary | ICD-10-CM

## 2013-12-05 LAB — BASIC METABOLIC PANEL
BUN: 12 mg/dL (ref 6–23)
CHLORIDE: 104 meq/L (ref 96–112)
CO2: 30 meq/L (ref 19–32)
CREATININE: 0.9 mg/dL (ref 0.4–1.2)
Calcium: 9.1 mg/dL (ref 8.4–10.5)
GFR: 70.47 mL/min (ref >60.00)
Glucose, Bld: 118 mg/dL — ABNORMAL HIGH (ref 70–99)
POTASSIUM: 4 meq/L (ref 3.5–5.1)
SODIUM: 142 meq/L (ref 135–145)

## 2013-12-05 LAB — HEPATIC FUNCTION PANEL
ALBUMIN: 4 g/dL (ref 3.5–5.2)
ALT: 21 U/L (ref 0–35)
AST: 21 U/L (ref 0–37)
Alkaline Phosphatase: 56 U/L (ref 39–117)
BILIRUBIN DIRECT: 0.1 mg/dL (ref 0.0–0.3)
Total Bilirubin: 0.3 mg/dL (ref 0.3–1.2)
Total Protein: 7.2 g/dL (ref 6.0–8.3)

## 2013-12-05 LAB — T4, FREE: Free T4: 0.69 ng/dL (ref 0.60–1.60)

## 2013-12-05 LAB — CBC WITH DIFFERENTIAL/PLATELET
Basophils Absolute: 0 10*3/uL (ref 0.0–0.1)
Basophils Relative: 0.3 % (ref 0.0–3.0)
EOS PCT: 1.6 % (ref 0.0–5.0)
Eosinophils Absolute: 0.1 10*3/uL (ref 0.0–0.7)
HEMATOCRIT: 41.3 % (ref 36.0–46.0)
Hemoglobin: 13.9 g/dL (ref 12.0–15.0)
Lymphocytes Relative: 39.2 % (ref 12.0–46.0)
Lymphs Abs: 3.4 10*3/uL (ref 0.7–4.0)
MCHC: 33.8 g/dL (ref 30.0–36.0)
MCV: 94.2 fl (ref 78.0–100.0)
Monocytes Absolute: 0.6 10*3/uL (ref 0.1–1.0)
Monocytes Relative: 6.7 % (ref 3.0–12.0)
NEUTROS ABS: 4.5 10*3/uL (ref 1.4–7.7)
NEUTROS PCT: 52.2 % (ref 43.0–77.0)
Platelets: 182 10*3/uL (ref 150.0–400.0)
RBC: 4.38 Mil/uL (ref 3.87–5.11)
RDW: 13.1 % (ref 11.5–14.6)
WBC: 8.7 10*3/uL (ref 4.5–10.5)

## 2013-12-05 LAB — MICROALBUMIN / CREATININE URINE RATIO
Creatinine,U: 147.3 mg/dL
MICROALB UR: 0.3 mg/dL (ref 0.0–1.9)
Microalb Creat Ratio: 0.2 mg/g (ref 0.0–30.0)

## 2013-12-05 LAB — LIPID PANEL
Cholesterol: 151 mg/dL (ref 0–200)
HDL: 58.8 mg/dL (ref >39.00)
LDL Cholesterol: 59 mg/dL (ref 0–99)
TRIGLYCERIDES: 168 mg/dL — AB (ref 0.0–149.0)
Total CHOL/HDL Ratio: 3
VLDL: 33.6 mg/dL (ref 0.0–40.0)

## 2013-12-05 LAB — HEMOGLOBIN A1C: Hgb A1c MFr Bld: 6.9 % — ABNORMAL HIGH (ref 4.6–6.5)

## 2013-12-05 LAB — TSH: TSH: 1.47 u[IU]/mL (ref 0.35–5.50)

## 2013-12-06 ENCOUNTER — Ambulatory Visit (AMBULATORY_SURGERY_CENTER): Payer: Managed Care, Other (non HMO)

## 2013-12-06 VITALS — Ht 63.0 in | Wt 183.8 lb

## 2013-12-06 DIAGNOSIS — Z86010 Personal history of colonic polyps: Secondary | ICD-10-CM

## 2013-12-06 MED ORDER — PREPOPIK 10-3.5-12 MG-GM-GM PO PACK: 1.0000 | PACK | ORAL | Status: DC

## 2013-12-06 NOTE — Progress Notes (Signed)
Pt came into the office today for her pre-visit prior to her colonoscopy with Dr Fuller Plan on 01/17/14.While going over medical history and prep instructions, pt got very mad when asked several medical questions and did not want to answer them and told me to look in the computer. She states she was just here and to look them up. I explained to the pt I would need to verify what was in the computer. After the pt left the office, she called back to say she was supposed to get the Prepopik prep instead of the Moviprep. I informed pt I would send a new order for the Prepopik to her pharmacy and mail her new instructions for Prepopik. Advised pt to call if she had any further questions.

## 2013-12-08 ENCOUNTER — Encounter: Payer: Self-pay | Admitting: Gastroenterology

## 2013-12-09 ENCOUNTER — Telehealth: Payer: Self-pay | Admitting: Internal Medicine

## 2013-12-09 NOTE — Telephone Encounter (Deleted)
Dr. Chalmers Cater, pt's endocrinologist at [facility] is calling to request that her most recent labs be sent to them at  Fax#: [phone].

## 2013-12-14 ENCOUNTER — Ambulatory Visit (INDEPENDENT_AMBULATORY_CARE_PROVIDER_SITE_OTHER): Payer: Managed Care, Other (non HMO)

## 2013-12-14 ENCOUNTER — Other Ambulatory Visit: Payer: Self-pay | Admitting: Women's Health

## 2013-12-14 ENCOUNTER — Encounter: Payer: Self-pay | Admitting: Women's Health

## 2013-12-14 ENCOUNTER — Ambulatory Visit (INDEPENDENT_AMBULATORY_CARE_PROVIDER_SITE_OTHER): Payer: Managed Care, Other (non HMO) | Admitting: Women's Health

## 2013-12-14 DIAGNOSIS — N840 Polyp of corpus uteri: Secondary | ICD-10-CM

## 2013-12-14 DIAGNOSIS — N83339 Acquired atrophy of ovary and fallopian tube, unspecified side: Secondary | ICD-10-CM

## 2013-12-14 DIAGNOSIS — R1032 Left lower quadrant pain: Secondary | ICD-10-CM

## 2013-12-14 DIAGNOSIS — R9389 Abnormal findings on diagnostic imaging of other specified body structures: Secondary | ICD-10-CM

## 2013-12-14 DIAGNOSIS — N949 Unspecified condition associated with female genital organs and menstrual cycle: Secondary | ICD-10-CM

## 2013-12-14 DIAGNOSIS — R109 Unspecified abdominal pain: Secondary | ICD-10-CM

## 2013-12-14 NOTE — Progress Notes (Signed)
Patient ID: Hannah Bryant, female   DOB: 1946/07/17, 68 y.o.   MRN: [id number] Presents for ultrasound for daily moderate left-sided pain/pressure since October. Denies constipation, history of gastroparesis and diverticulosis. Points to  left-sided mid lower abdomen as painful. Denies vaginal discharge, fever, urinary symptoms. Postmenopausal/no bleeding/no HRT.  Ultrasound: TV and TA images. Anteverted uterus with prominent endometrial cavity with with focus 8 x 6 x 15 mm. Negative CFD. Right ovary normal atrophic. Left ovary not visible, no apparent mass in the right or left adnexal. Excessive bowel activity left adnexa. Negative CDS.  Left-sided abdominal pain Questionable endometrial polyp Excessive bowel activity  Plan: Sonohysterogram with Dr. Phineas Real. Increase fiber rich foods in diet,

## 2013-12-16 ENCOUNTER — Encounter: Payer: Self-pay | Admitting: Internal Medicine

## 2013-12-20 ENCOUNTER — Encounter: Payer: Self-pay | Admitting: Internal Medicine

## 2013-12-21 ENCOUNTER — Other Ambulatory Visit: Payer: Self-pay | Admitting: Gynecology

## 2013-12-21 ENCOUNTER — Ambulatory Visit (INDEPENDENT_AMBULATORY_CARE_PROVIDER_SITE_OTHER): Payer: Managed Care, Other (non HMO) | Admitting: Gynecology

## 2013-12-21 ENCOUNTER — Ambulatory Visit (INDEPENDENT_AMBULATORY_CARE_PROVIDER_SITE_OTHER): Payer: Managed Care, Other (non HMO)

## 2013-12-21 ENCOUNTER — Encounter: Payer: Self-pay | Admitting: Gynecology

## 2013-12-21 DIAGNOSIS — N840 Polyp of corpus uteri: Secondary | ICD-10-CM

## 2013-12-21 DIAGNOSIS — Z5189 Encounter for other specified aftercare: Secondary | ICD-10-CM

## 2013-12-21 DIAGNOSIS — N882 Stricture and stenosis of cervix uteri: Secondary | ICD-10-CM

## 2013-12-21 DIAGNOSIS — R109 Unspecified abdominal pain: Secondary | ICD-10-CM

## 2013-12-21 DIAGNOSIS — R9389 Abnormal findings on diagnostic imaging of other specified body structures: Secondary | ICD-10-CM

## 2013-12-21 DIAGNOSIS — R52 Pain, unspecified: Secondary | ICD-10-CM

## 2013-12-21 DIAGNOSIS — R102 Pelvic and perineal pain: Secondary | ICD-10-CM

## 2013-12-21 MED ORDER — LIDOCAINE HCL (PF) 1 % IJ SOLN
10.0000 mL | Freq: Once | INTRAMUSCULAR | Status: AC
  Administered 2013-12-21: 10 mL

## 2013-12-21 MED ORDER — LIDOCAINE HCL 1 % IJ SOLN: 10.0000 mL | Freq: Once | INTRAMUSCULAR | Status: DC

## 2013-12-21 NOTE — Patient Instructions (Signed)
Office will call to schedule surgery

## 2013-12-21 NOTE — Progress Notes (Signed)
Patient presents for sonohysterogram. Has history of several months of nagging left lower quadrant pain. Occurs on a daily basis sometimes stops her activity. Sometimes feels better when she pushes over the area. No nausea vomiting diarrhea or constipation. No urinary symptoms such as frequency dysuria urgency. Has appointment for colonoscopy next month. Saw Izora Gala who ordered an ultrasound to rule out GYN pathology. No vaginal bleeding or GYN symptoms. Ultrasound results below. Was recommended for sonohysterogram based upon the questionable endometrial polyp.  Ultrasound showed uterus normal size with endometrial echo 5.1 mm and prominent with focus measuring 8 x 6 x 15 mm. Right ovary normal/atrophic. Left ovary not visualized with no adnexal pathology. Excessive bowel activity noted in the left quadrant area and cul-de-sac negative.  Exam HEENT normal Lungs clear Cardiac regular rate no rubs murmurs or gallops Abdomen soft with tenderness in the left lower quadrant. No rebound masses guarding or organomegaly. No evidence of hernia. Pelvic external BUS vagina with atrophic changes. Cervix normal. Uterus difficult to palpate but grossly normal midline mobile. Adnexa without gross masses.  Sonohysterogram performed, sterile technique. Paracervical block 1% lidocaine placed 8 cc total due to patient discomfort and need for slight cervical dilatation for catheter placement. Single-tooth tenaculum anterior lip stabilization. Catheter placed and subsequent cavity distention with clear endometrial polyp measuring 8 x 10 x 7 mm. Endometrial sample taken. Patient tolerated well.  Assessment and plan: Left lower quadrant pain, suspect GI in etiology. Reviewed potential for diverticulosis/-itis and other possibilities. Do not think GYN in source. Urinalysis was negative previously also.  Incidental endometrial polyp encountered as patient's not having any bleeding. The issues with finding an incidental polyp and  possibilities to include fortuitous early endometrial cancer finding versus overwhelming probability of benign. Options of expectant versus interventional management. Endometrial biopsy was taken but understands may not reflect pathology of the polyp. Ultimately we decided to proceed with hysteroscopy D&C and resection of polyp. I reviewed with involved with the procedure to include instrumentation, using the resectoscope and D&C portion. The expected intraoperative and postoperative courses as well as the recovery period was discussed. The risks of infection, prolonged antibiotics, hemorrhage necessitating transfusion and the risks of transfusion were reviewed. The risk of uterine perforation and damage to internal organs including bowel, bladder, ureters, nerves either immediately recognized or delay recognized, necessitating major exploratory reparative surgeries and future repaired at surgeries including bowel resection, ostomy formation, bladder repair, ureteral damage repair was discussed with her. Distended media absorption and metabolic complications such as fluid overload, and seizures discussed. No guarantees to she will not have recurrent polyps also reviewed. Patient wants to proceed with surgery and we'll schedule this at her convenience. She declines a preoperative appointment if scheduled soon.

## 2013-12-21 NOTE — Addendum Note (Signed)
Addended by: Nelva Nay on: 12/21/2013 11:49 AM   Modules accepted: Orders

## 2013-12-23 ENCOUNTER — Encounter: Payer: Self-pay | Admitting: Internal Medicine

## 2013-12-23 ENCOUNTER — Other Ambulatory Visit: Payer: Self-pay | Admitting: Gynecology

## 2013-12-23 ENCOUNTER — Encounter: Payer: Self-pay | Admitting: Gastroenterology

## 2013-12-23 MED ORDER — MISOPROSTOL 200 MCG PO TABS: ORAL_TABLET | ORAL | Status: DC

## 2013-12-23 NOTE — H&P (Addendum)
  Hannah Bryant [birth year] [id number]   History and Physical  Chief complaint: Endometrial polyp  History of present illness: 68 y.o. G3P2002 who underwent ultrasound evaluation due to complaints of left lower quadrant pain. No history of vaginal bleeding or other symptoms. Ultrasound showed thicker endometrial echo and she subsequently underwent sonohysterogram which confirmed the presence of an endometrial polyp measuring 8 x 10 x 7 mm. Endometrial sample at that time was benign. Patient's admitted for hysteroscopic resection of her endometrial polyp. It is felt that this was an incidental finding and not the source of her discomfort which she is proceeding with alternative evaluation for.  Past medical history,surgical history, medications, allergies, family history and social history were all reviewed and documented in the EPIC chart. ROS:  Was performed and pertinent positives and negatives are included in the history of present illness.  Exam:  Pam assistant General: well developed, well nourished female, no acute distress HEENT: normal  Lungs: clear to auscultation without wheezing, rales or rhonchi  Cardiac: regular rate without rubs, murmurs or gallops  Abdomen: soft, nontender without masses, guarding, rebound, organomegaly  Pelvic: external bus vagina: Atrophic changes   Cervix: grossly normal  Uterus: normal size, midline and mobile, nontender  Adnexa: without masses or tenderness      Assessment/Plan:  68 y.o. G2X5284 with incidental endometrial polyp found on ultrasound without any vaginal bleeding. The issues with finding an incidental polyp and possibilities to include fortuitous early endometrial cancer finding versus overwhelming probability of benign. Options of expectant versus interventional management. Endometrial biopsy was taken but understands may not reflect pathology of the polyp. Ultimately we decided to proceed with hysteroscopy D&C and resection of polyp.  I  reviewed what is involved with the procedure to include instrumentation, using the resectoscope and D&C portion. The expected intraoperative and postoperative courses as well as the recovery period was discussed. The risks of infection, prolonged antibiotics, hemorrhage necessitating transfusion and the risks of transfusion were reviewed. The risk of uterine perforation and damage to internal organs including bowel, bladder, ureters, nerves either immediately recognized or delay recognized, necessitating major exploratory reparative surgeries and future repaired at surgeries including bowel resection, ostomy formation, bladder repair, ureteral damage repair was discussed with her. Distended media absorption and metabolic complications such as fluid overload, coma and seizures was also discussed. No guarantees that she will not have recurrent polyps also reviewed. Patient's questions were answered to her satisfaction she is ready to proceed with surgery.    Note: This document was prepared with digital dictation and possible smart phrase technology. Any transcriptional errors that result from this process are unintentional.  Anastasio Auerbach MD, 11:29 AM 12/23/2013

## 2013-12-26 ENCOUNTER — Encounter (HOSPITAL_COMMUNITY): Payer: Self-pay | Admitting: Pharmacist

## 2013-12-27 ENCOUNTER — Encounter: Payer: BC Managed Care – PPO | Admitting: Gastroenterology

## 2014-01-05 NOTE — Telephone Encounter (Signed)
error 

## 2014-01-06 ENCOUNTER — Encounter (HOSPITAL_COMMUNITY)
Admission: RE | Admit: 2014-01-06 | Discharge: 2014-01-06 | Disposition: A | Discharge status: Home / Self Care | Payer: Managed Care, Other (non HMO) | Source: Ambulatory Visit | Attending: Gynecology | Admitting: Gynecology

## 2014-01-06 ENCOUNTER — Ambulatory Visit: Payer: Managed Care, Other (non HMO) | Admitting: Gynecology

## 2014-01-06 ENCOUNTER — Other Ambulatory Visit: Payer: Self-pay

## 2014-01-06 ENCOUNTER — Encounter (HOSPITAL_COMMUNITY): Payer: Self-pay

## 2014-01-06 ENCOUNTER — Other Ambulatory Visit: Payer: Managed Care, Other (non HMO)

## 2014-01-06 DIAGNOSIS — Z01812 Encounter for preprocedural laboratory examination: Secondary | ICD-10-CM | POA: Insufficient documentation

## 2014-01-06 HISTORY — DX: Adverse effect of unspecified anesthetic, initial encounter: T41.45XA

## 2014-01-06 HISTORY — DX: Other complications of anesthesia, initial encounter: T88.59XA

## 2014-01-06 LAB — COMPREHENSIVE METABOLIC PANEL
ALT: 22 U/L (ref 0–35)
AST: 21 U/L (ref 0–37)
Albumin: 3.9 g/dL (ref 3.5–5.2)
Alkaline Phosphatase: 56 U/L (ref 39–117)
BILIRUBIN TOTAL: 0.4 mg/dL (ref 0.3–1.2)
BUN: 17 mg/dL (ref 6–23)
CHLORIDE: 101 meq/L (ref 96–112)
CO2: 31 mEq/L (ref 19–32)
Calcium: 9.3 mg/dL (ref 8.4–10.5)
Creatinine, Ser: 0.78 mg/dL (ref 0.50–1.10)
GFR calc non Af Amer: 83 mL/min — ABNORMAL LOW (ref >90)
GLUCOSE: 124 mg/dL — AB (ref 70–99)
Potassium: 4.2 mEq/L (ref 3.7–5.3)
Sodium: 141 mEq/L (ref 137–147)
Total Protein: 6.8 g/dL (ref 6.0–8.3)

## 2014-01-06 LAB — CBC
HEMATOCRIT: 40.1 % (ref 36.0–46.0)
HEMOGLOBIN: 13.3 g/dL (ref 12.0–15.0)
MCH: 31.4 pg (ref 26.0–34.0)
MCHC: 33.2 g/dL (ref 30.0–36.0)
MCV: 94.6 fL (ref 78.0–100.0)
Platelets: 162 10*3/uL (ref 150–400)
RBC: 4.24 MIL/uL (ref 3.87–5.11)
RDW: 13 % (ref 11.5–15.5)
WBC: 7 10*3/uL (ref 4.0–10.5)

## 2014-01-06 NOTE — Patient Instructions (Signed)
[place]  01/06/2014   Your procedure is scheduled on:  01/11/14  Enter through the Main Entrance of [facility] [place] up the phone at the desk and dial [phone].   Call this number if you have problems the morning of surgery: [phone]   Remember:   Do not eat food:After Midnight.  Do not drink clear liquids: After Midnight.  Take these medicines the morning of surgery with A SIP OF WATER: Nexium   Do not wear jewelry, make-up or nail polish.  Do not wear lotions, powders, or perfumes. You may wear deodorant.  Do not shave 48 hours prior to surgery.  Do not bring valuables to the hospital.  [facility] is not   responsible for any belongings or valuables brought to the hospital.  Contacts, dentures or bridgework may not be worn into surgery.  Leave suitcase in the car. After surgery it may be brought to your room.  For patients admitted to the hospital, checkout time is 11:00 AM the day of              discharge.   Patients discharged the day of surgery will not be allowed to drive             home.  Name and phone number of your driver: husband  Hannah Rinks "Clair Gulling"  Special Instructions:      Please read over the following fact sheets that you were given:   Surgical Site Infection Prevention

## 2014-01-10 MED ORDER — DEXTROSE 5 % IV SOLN
2.0000 g | INTRAVENOUS | Status: AC
  Administered 2014-01-11: 2 g via INTRAVENOUS
  Filled 2014-01-10: qty 2

## 2014-01-11 ENCOUNTER — Encounter (HOSPITAL_COMMUNITY): Payer: Self-pay | Admitting: *Deleted

## 2014-01-11 ENCOUNTER — Encounter (HOSPITAL_COMMUNITY)
Admission: RE | Disposition: A | Discharge status: Home / Self Care | Payer: Self-pay | Source: Ambulatory Visit | Attending: Gynecology

## 2014-01-11 ENCOUNTER — Ambulatory Visit (HOSPITAL_COMMUNITY): Payer: Managed Care, Other (non HMO) | Admitting: Anesthesiology

## 2014-01-11 ENCOUNTER — Encounter (HOSPITAL_COMMUNITY): Payer: Managed Care, Other (non HMO) | Admitting: Anesthesiology

## 2014-01-11 ENCOUNTER — Ambulatory Visit (HOSPITAL_COMMUNITY)
Admission: RE | Admit: 2014-01-11 | Discharge: 2014-01-11 | Disposition: A | Discharge status: Home / Self Care | Payer: Managed Care, Other (non HMO) | Source: Ambulatory Visit | Attending: Gynecology | Admitting: Gynecology

## 2014-01-11 DIAGNOSIS — Z87891 Personal history of nicotine dependence: Secondary | ICD-10-CM | POA: Insufficient documentation

## 2014-01-11 DIAGNOSIS — N840 Polyp of corpus uteri: Secondary | ICD-10-CM | POA: Insufficient documentation

## 2014-01-11 DIAGNOSIS — K449 Diaphragmatic hernia without obstruction or gangrene: Secondary | ICD-10-CM | POA: Insufficient documentation

## 2014-01-11 DIAGNOSIS — E119 Type 2 diabetes mellitus without complications: Secondary | ICD-10-CM | POA: Insufficient documentation

## 2014-01-11 DIAGNOSIS — K219 Gastro-esophageal reflux disease without esophagitis: Secondary | ICD-10-CM | POA: Insufficient documentation

## 2014-01-11 DIAGNOSIS — R1032 Left lower quadrant pain: Secondary | ICD-10-CM | POA: Insufficient documentation

## 2014-01-11 HISTORY — PX: DILATATION & CURETTAGE/HYSTEROSCOPY WITH TRUECLEAR: SHX6353

## 2014-01-11 LAB — GLUCOSE, CAPILLARY
GLUCOSE-CAPILLARY: 122 mg/dL — AB (ref 70–99)
Glucose-Capillary: 118 mg/dL — ABNORMAL HIGH (ref 70–99)

## 2014-01-11 SURGERY — DILATATION & CURETTAGE/HYSTEROSCOPY WITH TRUCLEAR
Anesthesia: General | Site: Uterus

## 2014-01-11 MED ORDER — PROPOFOL 10 MG/ML IV BOLUS
INTRAVENOUS | Status: DC | PRN
  Administered 2014-01-11: 200 mg via INTRAVENOUS

## 2014-01-11 MED ORDER — SODIUM CHLORIDE 0.9 % IR SOLN
Status: DC | PRN
  Administered 2014-01-11: 3000 mL

## 2014-01-11 MED ORDER — ONDANSETRON HCL 4 MG/2ML IJ SOLN
INTRAMUSCULAR | Status: DC | PRN
  Administered 2014-01-11: 4 mg via INTRAVENOUS

## 2014-01-11 MED ORDER — MIDAZOLAM HCL 2 MG/2ML IJ SOLN: 0.5000 mg | Freq: Once | INTRAMUSCULAR | Status: DC | PRN

## 2014-01-11 MED ORDER — PROMETHAZINE HCL 25 MG/ML IJ SOLN: 6.2500 mg | INTRAMUSCULAR | Status: DC | PRN

## 2014-01-11 MED ORDER — PROPOFOL 10 MG/ML IV EMUL
INTRAVENOUS | Status: AC
  Filled 2014-01-11: qty 60

## 2014-01-11 MED ORDER — MIDAZOLAM HCL 2 MG/2ML IJ SOLN
INTRAMUSCULAR | Status: DC | PRN
  Administered 2014-01-11: 2 mg via INTRAVENOUS

## 2014-01-11 MED ORDER — LIDOCAINE HCL 1 % IJ SOLN
INTRAMUSCULAR | Status: DC | PRN
  Administered 2014-01-11: 10 mL

## 2014-01-11 MED ORDER — FENTANYL CITRATE 0.05 MG/ML IJ SOLN
INTRAMUSCULAR | Status: DC | PRN
  Administered 2014-01-11: 100 ug via INTRAVENOUS

## 2014-01-11 MED ORDER — DEXAMETHASONE SODIUM PHOSPHATE 10 MG/ML IJ SOLN
INTRAMUSCULAR | Status: AC
  Filled 2014-01-11: qty 1

## 2014-01-11 MED ORDER — FENTANYL CITRATE 0.05 MG/ML IJ SOLN
25.0000 ug | INTRAMUSCULAR | Status: DC | PRN
Start: 2014-01-11 — End: 2014-01-11

## 2014-01-11 MED ORDER — FENTANYL CITRATE 0.05 MG/ML IJ SOLN
INTRAMUSCULAR | Status: AC
  Filled 2014-01-11: qty 2

## 2014-01-11 MED ORDER — LACTATED RINGERS IV SOLN
INTRAVENOUS | Status: DC
  Administered 2014-01-11 (×2): via INTRAVENOUS

## 2014-01-11 MED ORDER — ONDANSETRON HCL 4 MG/2ML IJ SOLN
INTRAMUSCULAR | Status: AC
  Filled 2014-01-11: qty 2

## 2014-01-11 MED ORDER — PROPOFOL INFUSION 10 MG/ML OPTIME
INTRAVENOUS | Status: DC | PRN
  Administered 2014-01-11: 140 ug/kg/min via INTRAVENOUS

## 2014-01-11 MED ORDER — DIPHENHYDRAMINE HCL 50 MG/ML IJ SOLN
INTRAMUSCULAR | Status: DC | PRN
  Administered 2014-01-11: 12.5 mg via INTRAVENOUS

## 2014-01-11 MED ORDER — LIDOCAINE HCL 1 % IJ SOLN
INTRAMUSCULAR | Status: AC
  Filled 2014-01-11: qty 20

## 2014-01-11 MED ORDER — MEPERIDINE HCL 25 MG/ML IJ SOLN: 6.2500 mg | INTRAMUSCULAR | Status: DC | PRN

## 2014-01-11 MED ORDER — MIDAZOLAM HCL 2 MG/2ML IJ SOLN
INTRAMUSCULAR | Status: AC
  Filled 2014-01-11: qty 2

## 2014-01-11 MED ORDER — DIPHENHYDRAMINE HCL 50 MG/ML IJ SOLN
INTRAMUSCULAR | Status: AC
  Filled 2014-01-11: qty 1

## 2014-01-11 MED ORDER — DEXAMETHASONE SODIUM PHOSPHATE 10 MG/ML IJ SOLN
INTRAMUSCULAR | Status: DC | PRN
  Administered 2014-01-11: 10 mg via INTRAVENOUS

## 2014-01-11 MED ORDER — LIDOCAINE HCL (CARDIAC) 20 MG/ML IV SOLN
INTRAVENOUS | Status: AC
  Filled 2014-01-11: qty 5

## 2014-01-11 SURGICAL SUPPLY — 26 items
BLADE INCISOR TRUC PLUS 2.9 (ABLATOR) IMPLANT
CANISTERS HI-FLOW 3000CC (CANNISTER) ×6 IMPLANT
CATH ROBINSON RED A/P 16FR (CATHETERS) ×3 IMPLANT
CLOTH BEACON ORANGE TIMEOUT ST (SAFETY) ×3 IMPLANT
CONTAINER PREFILL 10% NBF 60ML (FORM) ×4 IMPLANT
DRAPE HYSTEROSCOPY (DRAPE) ×3 IMPLANT
DRSG TELFA 3X8 NADH (GAUZE/BANDAGES/DRESSINGS) ×3 IMPLANT
ELECT REM PT RETURN 9FT ADLT (ELECTROSURGICAL) ×3
ELECTRODE REM PT RTRN 9FT ADLT (ELECTROSURGICAL) IMPLANT
GLOVE BIO SURGEON STRL SZ7.5 (GLOVE) ×3 IMPLANT
GLOVE BIOGEL PI IND STRL 7.0 (GLOVE) IMPLANT
GLOVE BIOGEL PI INDICATOR 7.0 (GLOVE) ×6
GLOVE ECLIPSE 6.5 STRL STRAW (GLOVE) ×6 IMPLANT
GLOVE SURG SS PI 7.0 STRL IVOR (GLOVE) ×10 IMPLANT
GOWN STRL REUS W/ TWL XL LVL3 (GOWN DISPOSABLE) ×1 IMPLANT
GOWN STRL REUS W/TWL XL LVL3 (GOWN DISPOSABLE) ×9
INCISOR TRUC PLUS BLADE 2.9 (ABLATOR) ×3
KIT HYSTEROSCOPY TRUCLEAR (ABLATOR) ×2 IMPLANT
NDL SPNL 22GX3.5 QUINCKE BK (NEEDLE) IMPLANT
NEEDLE SPNL 22GX3.5 QUINCKE BK (NEEDLE) ×3 IMPLANT
PACK VAGINAL MINOR WOMEN LF (CUSTOM PROCEDURE TRAY) ×3 IMPLANT
PAD DRESSING TELFA 3X8 NADH (GAUZE/BANDAGES/DRESSINGS) ×1 IMPLANT
PAD OB MATERNITY 4.3X12.25 (PERSONAL CARE ITEMS) ×5 IMPLANT
SYR CONTROL 10ML LL (SYRINGE) ×3 IMPLANT
TOWEL OR 17X24 6PK STRL BLUE (TOWEL DISPOSABLE) ×6 IMPLANT
WATER STERILE IRR 1000ML POUR (IV SOLUTION) ×3 IMPLANT

## 2014-01-11 NOTE — Anesthesia Preprocedure Evaluation (Addendum)
Anesthesia Evaluation  Patient identified by MRN, date of birth, ID band Patient awake    Reviewed: Allergy & Precautions, H&P , Patient's Chart, lab work & pertinent test results  History of Anesthesia Complications (+) PONV and history of anesthetic complications  Airway Mallampati: II      Dental   Pulmonary former smoker,  breath sounds clear to auscultation        Cardiovascular Exercise Tolerance: Good Rhythm:regular Rate:Normal     Neuro/Psych negative psych ROS   GI/Hepatic hiatal hernia, GERD-  Controlled,  Endo/Other  diabetes  Renal/GU      Musculoskeletal   Abdominal   Peds  Hematology   Anesthesia Other Findings   Reproductive/Obstetrics                         Anesthesia Physical Anesthesia Plan  ASA: II  Anesthesia Plan: General LMA   Post-op Pain Management:    Induction:   Airway Management Planned:   Additional Equipment:   Intra-op Plan:   Post-operative Plan:   Informed Consent: I have reviewed the patients History and Physical, chart, labs and discussed the procedure including the risks, benefits and alternatives for the proposed anesthesia with the patient or authorized representative who has indicated his/her understanding and acceptance.     Plan Discussed with: Anesthesiologist, CRNA and Surgeon  Anesthesia Plan Comments:       TIVA and triple antiemetic therapy. Had bad reaction to scope patch. Anesthesia Quick Evaluation

## 2014-01-11 NOTE — Anesthesia Procedure Notes (Signed)
Procedure Name: LMA Insertion Date/Time: 01/11/2014 7:24 AM Performed by: Bradely Rudin, Sheron Nightingale Pre-anesthesia Checklist: Patient identified, Patient being monitored, Emergency Drugs available, Timeout performed and Suction available Patient Re-evaluated:Patient Re-evaluated prior to inductionOxygen Delivery Method: Circle system utilized Preoxygenation: Pre-oxygenation with 100% oxygen Intubation Type: IV induction Ventilation: Mask ventilation without difficulty LMA: LMA with gastric port inserted LMA Size: 4.0 Number of attempts: 1 Dental Injury: Teeth and Oropharynx as per pre-operative assessment

## 2014-01-11 NOTE — Transfer of Care (Signed)
Immediate Anesthesia Transfer of Care Note  Patient: Hannah Bryant  Procedure(s) Performed: Procedure(s): DILATATION & CURETTAGE/HYSTEROSCOPY WITH TRUCLEAR (N/A)  Patient Location: PACU  Anesthesia Type:General  Level of Consciousness: awake and sedated  Airway & Oxygen Therapy: Patient Spontanous Breathing and Patient connected to nasal cannula oxygen  Post-op Assessment: Report given to PACU RN and Post -op Vital signs reviewed and stable  Post vital signs: Reviewed and stable  Complications: No apparent anesthesia complications

## 2014-01-11 NOTE — Discharge Instructions (Signed)
° °Postoperative Instructions Hysteroscopy D & C ° °Dr. Fontaine and the nursing staff have discussed postoperative instructions with you.  If you have any questions please ask them before you leave the hospital, or call Dr Fontaine’s office at [phone].   ° °We would like to emphasize the following instructions: ° ° °? Call the office to make your follow-up appointment as recommended by Dr Fontaine (usually 1-2 weeks). ° °? You were given a prescription, or one was ordered for you at the pharmacy you designated.  Get that prescription filled and take the medication according to instructions. ° °? You may eat a regular diet, but slowly until you start having bowel movements. ° °? Drink plenty of water daily. ° °? Nothing in the vagina (intercourse, douching, objects of any kind) for two weeks.  When reinitiating intercourse, if it is uncomfortable, stop and make an appointment with Dr Fontaine to be evaluated. ° °? No driving for one to two days until the effects of anesthesia has worn off.  No traveling out of town for several days. ° °? You may shower, but no baths for one week.  Walking up and down stairs is ok.  No heavy lifting, prolonged standing, repeated bending or any “working out” until your post op check. ° °? Rest frequently, listen to your body and do not push yourself and overdo it. ° °? Call if: ° °o Your pain medication does not seem strong enough. °o Worsening pain or abdominal bloating °o Persistent nausea or vomiting °o Difficulty with urination or bowel movements. °o Temperature of 101 degrees or higher. °o Heavy vaginal bleeding.  If your period is due, you may use tampons. °o You have any questions or concerns ° ° °DISCHARGE INSTRUCTIONS: D&C / D&E °The following instructions have been prepared to help you care for yourself upon your return home. °  °Personal hygiene: °• Use sanitary pads for vaginal drainage, not tampons. °• Shower the day after your procedure. °• NO tub baths, pools or  Jacuzzis for 2-3 weeks. °• Wipe front to back after using the bathroom. ° °Activity and limitations: °• Do NOT drive or operate any equipment for 24 hours. The effects of anesthesia are still present and drowsiness may result. °• Do NOT rest in bed all day. °• Walking is encouraged. °• Walk up and down stairs slowly. °• You may resume your normal activity in one to two days or as indicated by your physician. ° °Sexual activity: NO intercourse for at least 2 weeks after the procedure, or as indicated by your physician. ° °Diet: Eat a light meal as desired this evening. You may resume your usual diet tomorrow. ° °Return to work: You may resume your work activities in one to two days or as indicated by your doctor. ° °What to expect after your surgery: Expect to have vaginal bleeding/discharge for 2-3 days and spotting for up to 10 days. It is not unusual to have soreness for up to 1-2 weeks. You may have a slight burning sensation when you urinate for the first day. Mild cramps may continue for a couple of days. You may have a regular period in 2-6 weeks. ° °Call your doctor for any of the following: °• Excessive vaginal bleeding, saturating and changing one pad every hour. °• Inability to urinate 6 hours after discharge from hospital. °• Pain not relieved by pain medication. °• Fever of 100.4° F or greater. °• Unusual vaginal discharge or odor. ° ° Call for an   appointment:  ° ° °Patient’s signature: ______________________ ° °Nurse’s signature ________________________ ° °Support person's signature_______________________ ° ° ° °

## 2014-01-11 NOTE — Op Note (Signed)
Hannah Bryant [birth year] [id number]   Post Operative Note   Date of surgery:  01/11/2014  Pre Op Dx:  Endometrial polyp   Post Op Dx:  Endometrial polyp  Procedure:  Hysteroscopy, D&C, Truclear endometrial polyp resection  Surgeon:  Anastasio Auerbach  Anesthesia:  General  EBL:  Minimal  Distended media discrepancy:  093 cc saline  Complications:  None  Specimen:  #1 endometrial polyp #2 endometrial curetting to pathology  Findings: EUA:  External BUS vagina with atrophic changes. Cervix atrophic. Uterus normal size midline mobile. Adnexa without masses   Hysteroscopic: Adequate with fundus, right and left tubal ostia, anterior/posterior uterine surfaces, lower uterine segment and endocervical canal all visualized. Broad-based polyp left mid cavity resected to the level of the surrounding endometrium. The endometrium otherwise was atrophic in appearance.  Procedure:  The patient was taken to the operating room, underwent general anesthesia, was placed in the low dorsal lithotomy position, received a perineal/vaginal preparation with Betadine solution per nursing personnel and the bladder was emptied with in and out Foley catheterization. The time out was performed by the surgical team. An EUA was performed and the patient was draped in the usual fashion. The cervix was visualized with a speculum, anterior lip grasped with a single-tooth tenaculum and a paracervical block using 1% Xylocaine was placed, a total of 10 cc. The cervix was then gently and gradually dilated to admit the smaller Truclear hysteroscope and hysteroscopy was performed with findings noted above. Using the small Truclear incisor resector, the polyp was resected to the level of the surrounding endometrium without difficulty. A sharp curettage was then performed noting scant return and both specimens were sent separately to pathology. Repeat hysteroscopy showed an empty cavity with good distention and no evidence of  perforation. The instruments were removed and hemostasis was visualized at the tenaculum site and external cervical os. The patient was placed in the supine position, awakened without difficulty and taken to the recovery room in good condition having tolerated the procedure well. Intraoperative Toradol was not given due to her reported allergies.   Note: This document was prepared with digital dictation and possible smart phrase technology. Any transcriptional errors that result from this process are unintentional.  Anastasio Auerbach MD, 8:35 AM 01/11/2014

## 2014-01-11 NOTE — H&P (Signed)
  The patient was examined.  I reviewed the proposed surgery and consent form with the patient.  The dictated history and physical is current and accurate and all questions were answered. The patient is ready to proceed with surgery and has a realistic understanding and expectation for the outcome.   Anastasio Auerbach MD, 7:01 AM 01/11/2014

## 2014-01-12 ENCOUNTER — Encounter (HOSPITAL_COMMUNITY): Payer: Self-pay | Admitting: Gynecology

## 2014-01-12 NOTE — Anesthesia Postprocedure Evaluation (Signed)
  Anesthesia Post-op Note  Patient: Hannah Bryant  Procedure(s) Performed: Procedure(s): DILATATION & CURETTAGE/HYSTEROSCOPY WITH TRUCLEAR (N/A) Patient is awake and responsive. Pain and nausea are reasonably well controlled. Vital signs are stable and clinically acceptable. Oxygen saturation is clinically acceptable. There are no apparent anesthetic complications at this time. Patient is ready for discharge.

## 2014-01-17 ENCOUNTER — Ambulatory Visit (AMBULATORY_SURGERY_CENTER): Payer: Managed Care, Other (non HMO) | Admitting: Gastroenterology

## 2014-01-17 ENCOUNTER — Encounter: Payer: Self-pay | Admitting: Gastroenterology

## 2014-01-17 VITALS — BP 146/74 | HR 59 | Temp 98.3°F | Resp 26 | Ht 63.0 in | Wt 183.0 lb

## 2014-01-17 DIAGNOSIS — Z860100 Personal history of colon polyps, unspecified: Secondary | ICD-10-CM

## 2014-01-17 DIAGNOSIS — D126 Benign neoplasm of colon, unspecified: Secondary | ICD-10-CM

## 2014-01-17 LAB — GLUCOSE, CAPILLARY
GLUCOSE-CAPILLARY: 107 mg/dL — AB (ref 70–99)
GLUCOSE-CAPILLARY: 107 mg/dL — AB (ref 70–99)

## 2014-01-17 MED ORDER — SODIUM CHLORIDE 0.9 % IV SOLN: 500.0000 mL | INTRAVENOUS | Status: DC

## 2014-01-17 MED ORDER — DICYCLOMINE HCL 10 MG PO CAPS: 10.0000 mg | ORAL_CAPSULE | Freq: Four times a day (QID) | ORAL | Status: DC | PRN

## 2014-01-17 NOTE — Progress Notes (Signed)
Report to pacu rn, vss, bbs=clear 

## 2014-01-17 NOTE — Patient Instructions (Signed)
Discharge instructions given with verbal understanding. Handouts on polyps and diverticulosis. Resume previous medications. YOU HAD AN ENDOSCOPIC PROCEDURE TODAY AT THE [facility]: Refer to the procedure report that was given to you for any specific questions about what was found during the examination.  If the procedure report does not answer your questions, please call your gastroenterologist to clarify.  If you requested that your care partner not be given the details of your procedure findings, then the procedure report has been included in a sealed envelope for you to review at your convenience later.  YOU SHOULD EXPECT: Some feelings of bloating in the abdomen. Passage of more gas than usual.  Walking can help get rid of the air that was put into your GI tract during the procedure and reduce the bloating. If you had a lower endoscopy (such as a colonoscopy or flexible sigmoidoscopy) you may notice spotting of blood in your stool or on the toilet paper. If you underwent a bowel prep for your procedure, then you may not have a normal bowel movement for a few days.  DIET: Your first meal following the procedure should be a light meal and then it is ok to progress to your normal diet.  A half-sandwich or bowl of soup is an example of a good first meal.  Heavy or fried foods are harder to digest and may make you feel nauseous or bloated.  Likewise meals heavy in dairy and vegetables can cause extra gas to form and this can also increase the bloating.  Drink plenty of fluids but you should avoid alcoholic beverages for 24 hours.  ACTIVITY: Your care partner should take you home directly after the procedure.  You should plan to take it easy, moving slowly for the rest of the day.  You can resume normal activity the day after the procedure however you should NOT DRIVE or use heavy machinery for 24 hours (because of the sedation medicines used during the test).    SYMPTOMS TO REPORT  IMMEDIATELY: A gastroenterologist can be reached at any hour.  During normal business hours, 8:30 AM to 5:00 PM Monday through Friday, call [phone].  After hours and on weekends, please call the GI answering service at [phone] who will take a message and have the physician on call contact you.   Following lower endoscopy (colonoscopy or flexible sigmoidoscopy):  Excessive amounts of blood in the stool  Significant tenderness or worsening of abdominal pains  Swelling of the abdomen that is new, acute  Fever of 100F or higher  FOLLOW UP: If any biopsies were taken you will be contacted by phone or by letter within the next 1-3 weeks.  Call your gastroenterologist if you have not heard about the biopsies in 3 weeks.  Our staff will call the home number listed on your records the next business day following your procedure to check on you and address any questions or concerns that you may have at that time regarding the information given to you following your procedure. This is a courtesy call and so if there is no answer at the home number and we have not heard from you through the emergency physician on call, we will assume that you have returned to your regular daily activities without incident.  SIGNATURES/CONFIDENTIALITY: You and/or your care partner have signed paperwork which will be entered into your electronic medical record.  These signatures attest to the fact that that the information above on your After Visit Summary   has been reviewed and is understood.  Full responsibility of the confidentiality of this discharge information lies with you and/or your care-partner. 

## 2014-01-17 NOTE — Op Note (Signed)
[place]  [address] [place], [id number]   COLONOSCOPY PROCEDURE REPORT PATIENT: Hannah Bryant, Hannah Bryant  MR#: [id number] BIRTHDATE: 02-22-1946 , 68  yrs. old GENDER: Female ENDOSCOPIST: Ladene Artist, MD, [facility] PROCEDURE DATE:  01/17/2014 PROCEDURE:   Colonoscopy with biopsy and snare polypectomy First Screening Colonoscopy - Avg.  risk and is 50 yrs.  old or older - No.  Prior Negative Screening - Now for repeat screening. N/A  History of Adenoma - Now for follow-up colonoscopy & has been > or = to 3 yrs.  Yes hx of adenoma.  Has been 3 or more years since last colonoscopy.  Polyps Removed Today? Yes. ASA CLASS:   Class II INDICATIONS:Patient's personal history of adenomatous colon polyps.  MEDICATIONS: MAC sedation, administered by CRNA and propofol (Diprivan) 220mg  IV DESCRIPTION OF PROCEDURE:   After the risks benefits and alternatives of the procedure were thoroughly explained, informed consent was obtained.  A digital rectal exam revealed no abnormalities of the rectum.   The LB [id number]  endoscope was introduced through the anus and advanced to the cecum, which was identified by both the appendix and ileocecal valve. No adverse events experienced.   The quality of the prep was good, using MoviPrep  The instrument was then slowly withdrawn as the colon was fully examined.  COLON FINDINGS: A sessile polyp measuring 4 mm in size was found at the cecum.  A polypectomy was performed with cold forceps.  The resection was complete and the polyp tissue was completely retrieved.   Two sessile polyps measuring 5-6 mm in size were found in the transverse colon.  A polypectomy was performed with a cold snare.  The resection was complete and the polyp tissue was completely retrieved.   A sessile polyp measuring 10 mm in size was found in the descending colon.  A polypectomy was performed using snare cautery.  The resection was complete and the polyp tissue  was completely retrieved.   A sessile polyp measuring 6 mm in size was found in the sigmoid colon.  A polypectomy was performed with a cold snare.  The resection was complete and the polyp tissue was completely retrieved.   Moderate diverticulosis was noted in the sigmoid colon.   The colon was otherwise normal.  There was no diverticulosis, inflammation, polyps or cancers unless previously stated.  Retroflexed views revealed no abnormalities. The time to cecum=3 minutes 47 seconds.  Withdrawal time=13 minutes 01 seconds. The scope was withdrawn and the procedure completed. COMPLICATIONS: There were no complications. ENDOSCOPIC IMPRESSION: 1.   Sessile polyp measuring 4 mm at the cecum; polypectomy performed with cold forceps 2.   Two sessile polyps measuring 5-6 mm in the transverse colon; polypectomy performed with a cold snare 3.   Sessile polyp measuring 10 mm in the descending colon; polypectomy performed using snare cautery 4.   Sessile polyp measuring 6 mm in the sigmoid colon; polypectomy performed with a cold snare 5.   Moderate diverticulosis in the sigmoid colon RECOMMENDATIONS: 1.  Await pathology results 2.  Hold aspirin, aspirin products, and anti-inflammatory medication for 2 weeks. 3.  High fiber diet with liberal fluid intake. 4.  Repeat Colonoscopy in 3 years. 5.  Bentyl 10 mg qid prn abd pain, abd cramping, #60, 11 refills eSigned:  Ladene Artist, MD, [facility] 01/17/2014 10:39 AM     PATIENT NAME:  Hannah Bryant, Hannah Bryant MR#: [id number]

## 2014-01-17 NOTE — Progress Notes (Signed)
Called to room to assist during endoscopic procedure.  Patient ID and intended procedure confirmed with present staff. Received instructions for my participation in the procedure from the performing physician.  

## 2014-01-17 NOTE — Progress Notes (Signed)
Patient states that she has hypoglycemia, and wished me to take her CBG.  It was 107.  She stated that it "falls like a rock when it gets to 98. Noted on small slip with vital signs.

## 2014-01-18 ENCOUNTER — Telehealth: Payer: Self-pay | Admitting: *Deleted

## 2014-01-18 NOTE — Telephone Encounter (Signed)
  Follow up Call-  Call back number 01/17/2014  Post procedure Call Back phone  # [phone]  Permission to leave phone message Yes     Patient questions:  Do you have a fever, pain , or abdominal swelling? no Pain Score  0 *  Have you tolerated food without any problems? yes  Have you been able to return to your normal activities? yes  Do you have any questions about your discharge instructions: Diet   no Medications  no Follow up visit  no  Do you have questions or concerns about your Care? no  Actions: * If pain score is 4 or above: No action needed, pain <4.

## 2014-01-19 ENCOUNTER — Encounter: Payer: Self-pay | Admitting: Internal Medicine

## 2014-01-23 ENCOUNTER — Encounter: Payer: Self-pay | Admitting: Gastroenterology

## 2014-01-26 ENCOUNTER — Encounter: Payer: Self-pay | Admitting: Gynecology

## 2014-01-26 ENCOUNTER — Ambulatory Visit (INDEPENDENT_AMBULATORY_CARE_PROVIDER_SITE_OTHER): Payer: Managed Care, Other (non HMO) | Admitting: Gynecology

## 2014-01-26 DIAGNOSIS — Z9889 Other specified postprocedural states: Secondary | ICD-10-CM

## 2014-01-26 NOTE — Patient Instructions (Addendum)
Follow up when you're due for your next annual exam.

## 2014-01-26 NOTE — Progress Notes (Signed)
Hannah Bryant [birth year] [id number]        68 y.o.  [id number] presents for her postoperative check status post hysteroscopic resection endometrial polyp. Patient is doing well with no bleeding or pain.  Past medical history,surgical history, problem list, medications, allergies, family history and social history were all reviewed and documented in the EPIC chart.  Exam: Kim assistant General appearance  Normal External BUS vagina with atrophic changes. Cervix normal. Uterus normal size midline mobile nontender. Adnexa without masses or tenderness.  Assessment/Plan:  68 y.o. G3P2002 normal postoperative check status post hysteroscopic resection of endometrial polyps. I reviewed pictures and pathology which showed benign endometrial polyp. Patient will followup as she's due for her annual exam, sooner if any issues.   Note: This document was prepared with digital dictation and possible smart phrase technology. Any transcriptional errors that result from this process are unintentional.   Anastasio Auerbach MD, 9:35 AM 01/26/2014

## 2014-02-01 ENCOUNTER — Encounter: Payer: Self-pay | Admitting: Dietician

## 2014-02-01 ENCOUNTER — Encounter: Payer: Managed Care, Other (non HMO) | Attending: Endocrinology | Admitting: Dietician

## 2014-02-01 VITALS — Ht 62.5 in | Wt 181.5 lb

## 2014-02-01 DIAGNOSIS — E162 Hypoglycemia, unspecified: Secondary | ICD-10-CM

## 2014-02-01 DIAGNOSIS — Z713 Dietary counseling and surveillance: Secondary | ICD-10-CM | POA: Insufficient documentation

## 2014-02-01 DIAGNOSIS — E119 Type 2 diabetes mellitus without complications: Secondary | ICD-10-CM | POA: Insufficient documentation

## 2014-02-01 NOTE — Progress Notes (Signed)
Appt start time: 1000 end time:  1100.  Assessment:  Patient was seen on 02/01/14 for individual diabetes education. Pt is consistently experiencing hypoglycemia between 3 pm and 6 pm, as low or lower than 50 mg/dL. Her usual lunch meal is approximately 12:30 pm. She has brought some BG records but has not been taking any postprandial measures. When she does have afternoon measures, they trend high, with quick falls over 50 points. She also reports that when eating refined carbs and/or sugary foods, she tends to see spikes in BG, followed by "crashes." This information suggests reactive hypoglycemia rather than inadequate CHO intake or overexercise.  Current HbA1c: 6.9  Preferred Learning Style:   No preference indicated   Learning Readiness:   Ready  MEDICATIONS: see list.  DIETARY INTAKE: Usual eating pattern includes 3 meals and 2-3 snacks per day. Everyday foods include oatmeal and yogurt in AM.  Avoided foods include waffles and syrup (related to this BG spike and crash).    24-hr recall:  B ( AM): between 8:30-9 am. oatmeal with blueberries, 4-6 oz of vanilla yogurt. Plus a probiotic, hyoscyamine, and tylenol. Coffee (half-caffeinated) with steamed skim milk.  Snk ( AM): 2 lance's PB crackers with water L ( PM): about 12:30. half sandwich (multigrain bread with ham or [place], lettuce, cheese, sprouts) with soup ([place] and bean soup or brunswick stew, or tortilla soup with beans), or salad with chix. May have black eyed peas with crackers. May have a cheeseburger with no bread when going out for lunch. Water. Snk ( PM): Between 2 and 3 pm. apples with PB or pita bread with humus. Water or small glass of skim milk. D ( PM): small portion of meat (only about 2 or 3 oz.) with small salad and green beans. Glass of skim milk. Usually no starches with dinner, unless a pizza or pasta dish is the main course. Drink is usually milk. Tends to avoid EtOH because it causes her BG to drop.  Snk (  PM): usually none. May have a small scoop of ice cream. Largely avoids this because she has reflux. Beverages: water, coffee in AM, milk Pt reports very minimal to no hunger throughout the day.  Usual physical activity: on TuTh at gym works with a trainer doing weights. Also does some bicycle riding. 15 minute walks in afternoon with dog. Also enjoys the elliptical machine. Mostly limited to walking dog since the fall due to surgery and GI spasms.   Progress Towards Goal(s):  In progress.   Nutritional Diagnosis:  Chase-2.1 Inpaired nutrition utilization As related to blood glucose uptake and insulin response.  As evidenced by pt report of hypoglycemia, most likely reactive hypoglycemia.    Intervention:  Nutrition counseling provided.  Discussed diabetes disease process and treatment options.  Discussed physiology of diabetes and role of obesity on insulin resistance.  Encouraged moderate weight reduction to improve glucose levels.  Discussed role of medications and diet in glucose control  Provided education on macronutrients on glucose levels.  Provided education on carb counting, importance of regularly scheduled meals/snacks, and meal planning  Discussed effects of physical activity on glucose levels and long-term glucose control.  Recommended 150 minutes of physical activity/week.  Reviewed patient medications.  Discussed role of medication on blood glucose and possible side effects  Discussed blood glucose monitoring and interpretation.  Discussed recommended target ranges and individual ranges.    Described short-term complications: hyper- and hypo-glycemia.  Discussed causes,symptoms, and treatment options.  Discussed prevention, detection,  and treatment of long-term complications.  Discussed the role of prolonged elevated glucose levels on body systems.  Discussed role of stress on blood glucose levels and discussed strategies to manage psychosocial issues.  Discussed  recommendations for long-term diabetes self-care.  Established checklist for medical, dental, and emotional self-care.  Teaching Method Utilized:  Visual Auditory  Handouts given during visit include:  Living Well with Diabetes  Barriers to learning/adherence to lifestyle change: established patterns, no other major hurdles.  Diabetes self-care support plan:   [facility] support group  RD explained to pt processes that my be at play to cause the hypoglycemia: either excessively high BG leading to reactive response which decreases BG too much, or inadequate CHO consumption throughout the day. RD explained primary treatments for each. Pt will check BG before lunch, 2 hours postprandial, and upon feeling symptoms and report those back via e-mail or upon F/U. After identifying the trends in her BG over the afternoon, plan of care can be established to treat either lack of adequate CHO or reactive hypoglycemia.   RD took extra time to explain the 15:15 rule for treating hypoglycemia, and recommended different options for the pt as far as fast-acting CHO to keep with her.   Demonstrated degree of understanding via:  Teach Back   Monitoring/Evaluation:  Dietary intake, exercise, BG trends, and body weight in 2 week(s).

## 2014-02-16 ENCOUNTER — Other Ambulatory Visit: Payer: Self-pay | Admitting: Internal Medicine

## 2014-02-17 ENCOUNTER — Encounter: Payer: Managed Care, Other (non HMO) | Attending: Endocrinology | Admitting: Dietician

## 2014-02-17 DIAGNOSIS — Z713 Dietary counseling and surveillance: Secondary | ICD-10-CM | POA: Insufficient documentation

## 2014-02-17 DIAGNOSIS — E119 Type 2 diabetes mellitus without complications: Secondary | ICD-10-CM | POA: Insufficient documentation

## 2014-02-17 DIAGNOSIS — E162 Hypoglycemia, unspecified: Secondary | ICD-10-CM

## 2014-02-17 NOTE — Progress Notes (Signed)
A: Pt reports for F/U on T2DM with recurrent hypoglycemia. Based on pt BG logs, issue is clearly reactive hypoglycemia, taking place almost exclusively after lunch and mid-afternoon snacks.   I: RD prescribed very low CHO lunch and afternoon snacks to prevent spikes in BG which lead to exaggerated insulin correction and thus hypoglycemia. Pt will eat nonstarchy vegetables and proteins only for lunch. Primary snack options are nuts, seeds, and edamame.   RD also suggested getting a continuous glucose monitor to more accurately and easily identify both highs and lows in BG without constant finger-sticks. Pt currently test BG over 10 times per day, and she starting to gain scar tissue on fingertips. RD will call Dr. Chalmers Cater for agreement in this plan of care.  M/E: Monitor BG, weight, exercise PRN.

## 2014-02-21 ENCOUNTER — Other Ambulatory Visit: Payer: Self-pay | Admitting: Internal Medicine

## 2014-02-21 ENCOUNTER — Encounter: Payer: Self-pay | Admitting: Internal Medicine

## 2014-02-21 DIAGNOSIS — N390 Urinary tract infection, site not specified: Secondary | ICD-10-CM

## 2014-02-21 DIAGNOSIS — R6883 Chills (without fever): Secondary | ICD-10-CM

## 2014-02-22 ENCOUNTER — Encounter: Payer: Self-pay | Admitting: Internal Medicine

## 2014-02-22 ENCOUNTER — Ambulatory Visit (INDEPENDENT_AMBULATORY_CARE_PROVIDER_SITE_OTHER): Payer: Managed Care, Other (non HMO) | Admitting: Internal Medicine

## 2014-02-22 ENCOUNTER — Other Ambulatory Visit (INDEPENDENT_AMBULATORY_CARE_PROVIDER_SITE_OTHER): Payer: Managed Care, Other (non HMO)

## 2014-02-22 VITALS — BP 138/80 | HR 59 | Temp 97.7°F | Resp 14 | Wt 180.0 lb

## 2014-02-22 DIAGNOSIS — R6883 Chills (without fever): Secondary | ICD-10-CM

## 2014-02-22 DIAGNOSIS — R82998 Other abnormal findings in urine: Secondary | ICD-10-CM

## 2014-02-22 DIAGNOSIS — N390 Urinary tract infection, site not specified: Secondary | ICD-10-CM

## 2014-02-22 DIAGNOSIS — R300 Dysuria: Secondary | ICD-10-CM

## 2014-02-22 LAB — CBC WITH DIFFERENTIAL/PLATELET
BASOS ABS: 0 10*3/uL (ref 0.0–0.1)
BASOS PCT: 0.4 % (ref 0.0–3.0)
EOS ABS: 0.3 10*3/uL (ref 0.0–0.7)
Eosinophils Relative: 4.4 % (ref 0.0–5.0)
HCT: 39.5 % (ref 36.0–46.0)
Hemoglobin: 13.3 g/dL (ref 12.0–15.0)
Lymphocytes Relative: 40.7 % (ref 12.0–46.0)
Lymphs Abs: 3 10*3/uL (ref 0.7–4.0)
MCHC: 33.6 g/dL (ref 30.0–36.0)
MCV: 94.8 fl (ref 78.0–100.0)
MONO ABS: 0.7 10*3/uL (ref 0.1–1.0)
Monocytes Relative: 9.5 % (ref 3.0–12.0)
NEUTROS PCT: 45 % (ref 43.0–77.0)
Neutro Abs: 3.3 10*3/uL (ref 1.4–7.7)
Platelets: 160 10*3/uL (ref 150.0–400.0)
RBC: 4.16 Mil/uL (ref 3.87–5.11)
RDW: 13.3 % (ref 11.5–14.6)
WBC: 7.3 10*3/uL (ref 4.5–10.5)

## 2014-02-22 LAB — URINALYSIS
Bilirubin Urine: NEGATIVE
Ketones, ur: NEGATIVE
LEUKOCYTES UA: NEGATIVE
Nitrite: NEGATIVE
Specific Gravity, Urine: <=1.005 — AB (ref 1.000–1.030)
Total Protein, Urine: NEGATIVE
URINE GLUCOSE: NEGATIVE
UROBILINOGEN UA: 0.2 (ref 0.0–1.0)
pH: 6 (ref 5.0–8.0)

## 2014-02-22 NOTE — Patient Instructions (Signed)
Drink as much nondairy fluids as possible. Avoid spicy foods or alcohol as  these may aggravate the bladder. Do not take decongestants. Avoid narcotics if possible. 

## 2014-02-22 NOTE — Progress Notes (Signed)
Pre visit review using our clinic review tool, if applicable. No additional management support is needed unless otherwise documented below in the visit note. 

## 2014-02-22 NOTE — Progress Notes (Signed)
   Subjective:    Patient ID: Hannah Bryant, female    DOB: [birth year], 68 y.o.   MRN: [id number]  HPI  [facility] and diagnosed as having urinary tract infection; nitrofurantoin was prescribed. She received a call 4/5 stating that urine culture was negative. At that time she stopped the nitrofurantoin.  She's had some dysuria; there's no definite hematuria but urine has been dark.  She's had some flank discomfort. She also had fever, rigor & chills, sweats 4/6  In January of this year C&S revealed 100,000 colonies of multiple species. Because of the colony count urine culture was repeated. There were only 30,000 colonies of multiple species at that time.  There no definite positive proven cultures on record.  She has not taken antibiotics last 30 days. She has no history of renal calculi or any anatomical abnormality of the urinary tract. She's never had a cystoscopic procedure.    Review of Systems  She is not having hesitancy, urgency, polyuria, or pyuria. She has no significant nocturia.     Objective:   Physical Exam  General appearance: adequate nourishment w/o distress.  Eyes: No conjunctival inflammation or scleral icterus is present.  Oral exam: Dental hygiene is good; lips and gums are healthy appearing.There is no oropharyngeal erythema or exudate noted.   Heart:  Normal rate and regular rhythm. S1 and S2 normal without gallop, murmur, click, rub or other extra sounds     Lungs:Chest clear to auscultation; no wheezes, rhonchi,rales ,or rubs present.No increased work of breathing.   Abdomen: bowel sounds normal, soft and non-tender without masses, organomegaly or hernias noted.  No guarding or rebound . No tenderness over the flanks to percussion  Musculoskeletal: Able to lie flat and sit up without help. Negative straight leg raising bilaterally. Gait normal  Skin:Warm & dry.  Intact without suspicious lesions or rashes ; no  jaundice or tenting  Lymphatic: No lymphadenopathy is noted about the head, neck, axilla.                Assessment & Plan:  #1 dysuria  #2 dark urine  #3) chills & rigor  Plan: CBC and differential; repeat urine culture. If the culture is negative; urologic assessment is indicated to rule out other etiologies of her symptoms

## 2014-02-23 LAB — URINE CULTURE
COLONY COUNT: NO GROWTH
Organism ID, Bacteria: NO GROWTH

## 2014-04-07 ENCOUNTER — Ambulatory Visit (INDEPENDENT_AMBULATORY_CARE_PROVIDER_SITE_OTHER): Payer: Managed Care, Other (non HMO) | Admitting: Internal Medicine

## 2014-04-07 ENCOUNTER — Encounter: Payer: Self-pay | Admitting: Internal Medicine

## 2014-04-07 VITALS — BP 148/92 | HR 69 | Temp 98.0°F | Resp 13 | Wt 178.6 lb

## 2014-04-07 DIAGNOSIS — R042 Hemoptysis: Secondary | ICD-10-CM

## 2014-04-07 DIAGNOSIS — R030 Elevated blood-pressure reading, without diagnosis of hypertension: Secondary | ICD-10-CM

## 2014-04-07 DIAGNOSIS — J209 Acute bronchitis, unspecified: Secondary | ICD-10-CM

## 2014-04-07 MED ORDER — PREDNISONE 20 MG PO TABS: 20.0000 mg | ORAL_TABLET | Freq: Two times a day (BID) | ORAL | Status: DC

## 2014-04-07 MED ORDER — BUDESONIDE-FORMOTEROL FUMARATE 160-4.5 MCG/ACT IN AERO: 2.0000 | INHALATION_SPRAY | Freq: Two times a day (BID) | RESPIRATORY_TRACT | Status: DC

## 2014-04-07 NOTE — Progress Notes (Signed)
   Subjective:    Patient ID: Hannah Bryant, female    DOB: Feb 15, 1946, 68 y.o.   MRN: [id number]  HPI   Symptoms began 03/28/14 and progress. It has been a cough which has been productive of white thick sputum.  She believes it began after exposure to dust from her chimney. She had it professionally cleaned; she does not believe that the sweep recently used a vacuum adequately. She also has had pollen exposures.  She was seen in urgent care 5/17 & amoxicillin prescribed.  She's also used Mucinex, Delsym, lozenges  She has low-grade fever, sinus pressure and wheezing. The cough has been associated with some pink discoloration in the sputum  She hass not smoked since 1981. She has no history of asthma.  Her fasting blood sugars have averaged less than 1:30. Petra Kuba issues have been reactive hypoglycemia.,    Review of Systems  She denies itchy, watery eyes.  She hasnot not had chills and sweats.  She also denies pleuritic chest pain.  Nasal secretions have been copious but not discolored. She denies associated sneezing with postnasal drainage  Despite wheezing she denies shortness of breath  Reflux is presently controlled.       Objective:   Physical Exam General appearance:good health ;well nourished; no acute distress or increased work of breathing is present.  No  lymphadenopathy about the head, neck, or axilla noted.   Eyes: No conjunctival inflammation or lid edema is present. There is no scleral icterus.  Ears:  External ear exam shows no significant lesions or deformities.  Otoscopic examination reveals clear canals, tympanic membranes are intact bilaterally without bulging, retraction, inflammation or discharge.  Nose:  External nasal examination shows no deformity or inflammation. Nasal mucosa are pink and moist without lesions or exudates. No septal dislocation or deviation.No obstruction to airflow.   Oral exam: Dental hygiene is good; lips and gums are  healthy appearing.There is no oropharyngeal erythema or exudate noted.   Neck:  No deformities, thyromegaly, masses, or tenderness noted.   Supple with full range of motion without pain.   Heart:  Normal rate and regular rhythm. S1 and S2 normal without gallop, murmur, click, rub or other extra sounds.   Lungs:Scattered low grade wheezes w/o  rhonchi,rales ,or rubs present.No increased work of breathing.    Extremities:  No cyanosis, edema, or clubbing  noted  Upper thoracic lordosis   Skin: Warm & dry          Assessment & Plan:  #1 acute bronchitis with bronchospasm #2 minor hemoptysis from coughing #3 elevated blood pressure without diagnosis of hypertension #4 rhinitis Plan: See orders and recommendations

## 2014-04-07 NOTE — Progress Notes (Signed)
Pre visit review using our clinic review tool, if applicable. No additional management support is needed unless otherwise documented below in the visit note. 

## 2014-04-07 NOTE — Patient Instructions (Addendum)
Plain Mucinex (NOT D) for thick secretions ;force NON dairy fluids .   Nasal cleansing in the shower as discussed with lather of mild shampoo.After 10 seconds wash off lather while  exhaling through nostrils. Make sure that all residual soap is removed to prevent irritation.  Flonase OR Nasacort AQ 1 spray in each nostril twice a day as needed. Use the "crossover" technique into opposite nostril spraying toward opposite ear @ 45 degree angle, not straight up into nostril.  Use a Neti pot daily only  as needed for significant sinus congestion; going from open side to congested side . Plain Allegra (NOT D )  160 daily , Loratidine 10 mg , OR Zyrtec 10 mg @ bedtime  as needed for itchy eyes & sneezing.  Symbicort AQ one - two inhalations every 12 hours; gargle and spit after use Fill the  prescription for Prednisone it there is not dramatic improvement in the next 72 hours. Minimal Blood Pressure Goal= AVERAGE < 140/90;  Ideal is an AVERAGE < 135/85. This AVERAGE should be calculated from @ least 5-7 BP readings taken @ different times of day on different days of week. You should not respond to isolated BP readings , but rather the AVERAGE for that week .Please bring your  blood pressure cuff to office visits to verify that it is reliable.It  can also be checked against the blood pressure device at the pharmacy. Finger or wrist cuffs are not dependable; an arm cuff is.

## 2014-04-22 ENCOUNTER — Other Ambulatory Visit: Payer: Self-pay | Admitting: Internal Medicine

## 2014-04-27 ENCOUNTER — Ambulatory Visit: Payer: Managed Care, Other (non HMO) | Admitting: *Deleted

## 2014-07-22 ENCOUNTER — Encounter: Payer: Self-pay | Admitting: Internal Medicine

## 2014-08-02 ENCOUNTER — Ambulatory Visit: Payer: Managed Care, Other (non HMO) | Admitting: *Deleted

## 2014-08-02 VITALS — BP 152/88

## 2014-08-02 DIAGNOSIS — R030 Elevated blood-pressure reading, without diagnosis of hypertension: Secondary | ICD-10-CM

## 2014-08-20 ENCOUNTER — Other Ambulatory Visit: Payer: Self-pay | Admitting: Internal Medicine

## 2014-08-21 ENCOUNTER — Encounter: Payer: Self-pay | Admitting: Internal Medicine

## 2014-08-21 ENCOUNTER — Ambulatory Visit (INDEPENDENT_AMBULATORY_CARE_PROVIDER_SITE_OTHER): Payer: Managed Care, Other (non HMO) | Admitting: Internal Medicine

## 2014-08-21 ENCOUNTER — Encounter: Payer: Self-pay | Admitting: Family Medicine

## 2014-08-21 ENCOUNTER — Ambulatory Visit (INDEPENDENT_AMBULATORY_CARE_PROVIDER_SITE_OTHER): Payer: Managed Care, Other (non HMO) | Admitting: Family Medicine

## 2014-08-21 ENCOUNTER — Ambulatory Visit (INDEPENDENT_AMBULATORY_CARE_PROVIDER_SITE_OTHER): Payer: Managed Care, Other (non HMO)

## 2014-08-21 VITALS — BP 148/92 | HR 69 | Wt 178.0 lb

## 2014-08-21 VITALS — BP 124/80 | HR 61 | Temp 98.2°F | Wt 176.8 lb

## 2014-08-21 DIAGNOSIS — M25512 Pain in left shoulder: Secondary | ICD-10-CM

## 2014-08-21 DIAGNOSIS — M7542 Impingement syndrome of left shoulder: Secondary | ICD-10-CM | POA: Diagnosis not present

## 2014-08-21 DIAGNOSIS — M75102 Unspecified rotator cuff tear or rupture of left shoulder, not specified as traumatic: Secondary | ICD-10-CM

## 2014-08-21 DIAGNOSIS — M12812 Other specific arthropathies, not elsewhere classified, left shoulder: Secondary | ICD-10-CM | POA: Insufficient documentation

## 2014-08-21 DIAGNOSIS — M12512 Traumatic arthropathy, left shoulder: Secondary | ICD-10-CM

## 2014-08-21 MED ORDER — DICLOFENAC SODIUM 2 % TD SOLN: TRANSDERMAL | Status: DC

## 2014-08-21 NOTE — Progress Notes (Signed)
   Subjective:    Patient ID: Hannah Bryant, female    DOB: [birth year], 68 y.o.   MRN: [id number]  HPI    She had a flu shot a local pharmacy 3 weeks ago. She had immediate discomfort at the injection site. Over the ensuing several weeks she's had residual discomfort from the shoulder to the elbow level in the left upper extremity.  She has difficulty with maneuvers such as pulling on her pants.  Tylenol arthritis every 4-6 hours is of some benefit as is heat topically.  She is unable to take nonsteroidals because of her severe reflux.    Review of Systems   She denies any constitutional symptoms of fever, chills, sweats, weight loss  She has no numbness, tingling, weakness in the upper extremity.     Objective:   Physical Exam   She is in no distress but resisted any elevation of the left upper extremity, wincing in pain when it is abducted more than 10 from the thorax.   No cranial nerve deficit  Cervical range of motion is normal without associated pain  There is accentuated curvature of the upper thoracic spine.  Deep tendon reflexes, strength, and tone in the extremities are normal.  There is no weakness to direct testing in the upper extremity.       Assessment & Plan:  #1 left shoulder impingement syndrome; rule out tendinitis. Clinically I do not believe this is related to the flu shot unless the discomfort from the shot itself caused immobilization phenomenon in  upper extremity.  Plan: Sports medicine referral

## 2014-08-21 NOTE — Patient Instructions (Signed)
I recommend you see Dr Zach Smith, Sports Medicine specialist., phone # [phone]. 

## 2014-08-21 NOTE — Progress Notes (Signed)
[name] [address] [place], [place] [address] Phone: [phone] Subjective:    I'm seeing this patient by the request  of:  Unice Cobble, MD   CC: Left shoulder pain.  [id number] Hannah Bryant is a 68 y.o. female coming in with complaint of left shoulder pain. Patient states that this has been a three-week history since she's gotten a flu shot. Patient states it hurts on the lateral aspect of her arm it hurts with any type of motion. Patient feels that the flu shot is still in her shoulder. Patient states that unfortunately does not seem to be improving. Patient denies any significant radiation and weakness but states that it is very difficult to move her arm. This severity is 6/10. No history of previous shoulder problems.     Past medical history, social, surgical and family history all reviewed in electronic medical record.   Review of Systems: No headache, visual changes, nausea, vomiting, diarrhea, constipation, dizziness, abdominal pain, skin rash, fevers, chills, night sweats, weight loss, swollen lymph nodes, body aches, joint swelling, muscle aches, chest pain, shortness of breath, mood changes.   Objective Blood pressure 148/92, pulse 69, weight 178 lb (80.74 kg), last menstrual period 11/18/2000, SpO2 97.00%.  General: No apparent distress alert and oriented x3 mood and affect normal, dressed appropriately.  HEENT: Pupils equal, extraocular movements intact  Respiratory: Patient's speak in full sentences and does not appear short of breath  Cardiovascular: No lower extremity edema, non tender, no erythema  Skin: Warm dry intact with no signs of infection or rash on extremities or on axial skeleton.  Abdomen: Soft nontender  Neuro: Cranial nerves II through XII are intact, neurovascularly intact in all extremities with 2+ DTRs and 2+ pulses.  Lymph: No lymphadenopathy of posterior or anterior cervical chain or axillae bilaterally.    Gait normal with good balance and coordination.  MSK:  Non tender with full range of motion and good stability and symmetric strength and tone of  elbows, wrist, hip, knee and ankles bilaterally.  Shoulder: Right Inspection reveals no abnormalities, atrophy or asymmetry. Palpation is normal with no tenderness over AC joint or bicipital groove. ROM is full in all planes passively. Rotator cuff strength normal throughout. signs of impingement with positive Neer and Hawkin's tests, but negative empty can sign. Speeds and Yergason's tests normal. No labral pathology noted with negative Obrien's, negative clunk and good stability. Normal scapular function observed. No painful arc and no drop arm sign. No apprehension sign Deltoid is nontender to exam. Patient points to the insertion of the deltoid when doing active abduction for where her pain is.  MSK US performed of: Right This study was ordered, performed, and interpreted by Charlann Boxer D.O.  Shoulder:   Supraspinatus:  Degenerative changes noted s, Bursal bulge seen with shoulder abduction on impingement view. Infraspinatus:  Appears normal on long and transverse views. Significant increase in Doppler flow Subscapularis:  Appears normal on long and transverse views. Positive bursa Teres Minor:  Appears normal on long and transverse views. AC joint:  Capsule undistended, no geyser sign. Glenohumeral Joint:  Moderate osteoarthritic changes Glenoid Labrum:  Intact without visualized tears. Biceps Tendon:  Appears normal on long and transverse views, no fraying of tendon, tendon located in intertubercular groove, no subluxation with shoulder internal or external rotation. Patient's deltoid has no significant abnormalities noted. Impression: Subacromial bursitis with degenerative rotator cuff tendinopathy as well as moderate osteophytic changes  Procedure: Real-time Ultrasound  Guided Injection of right glenohumeral joint Device: GE Logiq E   Ultrasound guided injection is preferred based studies that show increased duration, increased effect, greater accuracy, decreased procedural pain, increased response rate with ultrasound guided versus blind injection.  Verbal informed consent obtained.  Time-out conducted.  Noted no overlying erythema, induration, or other signs of local infection.  Skin prepped in a sterile fashion.  Local anesthesia: Topical Ethyl chloride.  With sterile technique and under real time ultrasound guidance:  Joint visualized.  23g 1  inch needle inserted posterior approach. Pictures taken for needle placement. Patient did have injection of 2 cc of 1% lidocaine, 2 cc of 0.5% Marcaine, and 1.0 cc of Kenalog 40 mg/dL. Completed without difficulty  Pain immediately resolved suggesting accurate placement of the medication.  Advised to call if fevers/chills, erythema, induration, drainage, or persistent bleeding.  Images permanently stored and available for review in the ultrasound unit.  Impression: Technically successful ultrasound guided injection.     Impression and Recommendations:     This case required medical decision making of moderate complexity.

## 2014-08-21 NOTE — Progress Notes (Signed)
Pre visit review using our clinic review tool, if applicable. No additional management support is needed unless otherwise documented below in the visit note. 

## 2014-08-21 NOTE — Patient Instructions (Signed)
Good to see you.  Exercises 3 times a week.  In gym no lifting for today and tomorrow and then start 50% and increase 10% a day back to your regular amount.  Ice 20 minutes 2 times daily. Usually after activity and before bed. Pennsaid twice daily  Call the pharmacy when you get home.  Turmeric 500mg  twice daily.  Come back in 3 weeks again if not perfect

## 2014-08-21 NOTE — Assessment & Plan Note (Signed)
The patient was given injection today. I believe the patient's pain is secondary to referred pain from her shoulder. I think she started having pain in the deltoid and unfortunately she started using her rotator cuff more which cause more pain. Patient did have some mild improvement immediately. Patient was given a trial topical anti-inflammatories a try as well. We discussed icing protocol. Patient was given home exercise program and to decrease weight lifting for short course of time. Patient will come back and see me again in 3 weeks to make sure she is completely better.

## 2014-09-11 ENCOUNTER — Ambulatory Visit: Payer: Managed Care, Other (non HMO) | Admitting: Family Medicine

## 2014-09-18 ENCOUNTER — Encounter: Payer: Self-pay | Admitting: Family Medicine

## 2014-10-11 ENCOUNTER — Encounter: Payer: Self-pay | Admitting: Family Medicine

## 2014-10-19 ENCOUNTER — Encounter: Payer: Self-pay | Admitting: Family Medicine

## 2014-10-19 ENCOUNTER — Ambulatory Visit (INDEPENDENT_AMBULATORY_CARE_PROVIDER_SITE_OTHER): Payer: Managed Care, Other (non HMO) | Admitting: Family Medicine

## 2014-10-19 ENCOUNTER — Ambulatory Visit (INDEPENDENT_AMBULATORY_CARE_PROVIDER_SITE_OTHER)
Admission: RE | Admit: 2014-10-19 | Discharge: 2014-10-19 | Disposition: A | Discharge status: Home / Self Care | Payer: Managed Care, Other (non HMO) | Source: Ambulatory Visit | Attending: Family Medicine | Admitting: Family Medicine

## 2014-10-19 VITALS — BP 124/82 | HR 72 | Ht 63.0 in | Wt 174.0 lb

## 2014-10-19 DIAGNOSIS — M12512 Traumatic arthropathy, left shoulder: Secondary | ICD-10-CM

## 2014-10-19 DIAGNOSIS — M25512 Pain in left shoulder: Secondary | ICD-10-CM

## 2014-10-19 DIAGNOSIS — M12812 Other specific arthropathies, not elsewhere classified, left shoulder: Secondary | ICD-10-CM

## 2014-10-19 DIAGNOSIS — M75102 Unspecified rotator cuff tear or rupture of left shoulder, not specified as traumatic: Secondary | ICD-10-CM

## 2014-10-19 MED ORDER — GABAPENTIN 100 MG PO CAPS: 100.0000 mg | ORAL_CAPSULE | Freq: Every day | ORAL | Status: DC

## 2014-10-19 NOTE — Progress Notes (Signed)
  [name] [address] [place], [place] [address] Phone: [phone] Subjective:     CC: Left shoulder pain follow-up  [id number] Hannah Bryant is a 68 y.o. female coming in with complaint of left shoulder pain. Patient was seen previously and was diagnosed with a degenerative joint of the left shoulder. Patient also had a rotator cuff tear arthropathy. Patient was given a corticosteroid injection. This was 2 months ago. Patient states overall she was doing significantly better. Patient has been doing a lot of activity and has been traveling. Patient states unfortunately that this is cause more discomfort in the left shoulder. Patient states it is not as bad as it was previously. Still though is now giving her more discomfort. Patient has times where she has difficulty raising shoulder. Patient has difficulty putting her shirt on in the morning. Denies any numbness or radiation down the arm. Since pain she states though seems to be a little bit different. Patient states that the pain seems to be more on the lateral aspect of the arm. Still considers the flu shot injection to be the exacerbating factor.     Past medical history, social, surgical and family history all reviewed in electronic medical record.   Review of Systems: No headache, visual changes, nausea, vomiting, diarrhea, constipation, dizziness, abdominal pain, skin rash, fevers, chills, night sweats, weight loss, swollen lymph nodes, body aches, joint swelling, muscle aches, chest pain, shortness of breath, mood changes.   Objective Blood pressure 124/82, pulse 72, height 5\' 3"  (1.6 m), weight 174 lb (78.926 kg), last menstrual period 11/18/2000, SpO2 97 %.  General: No apparent distress alert and oriented x3 mood and affect normal, dressed appropriately.  HEENT: Pupils equal, extraocular movements intact  Respiratory: Patient's speak in full sentences and does not appear short of breath    Cardiovascular: No lower extremity edema, non tender, no erythema  Skin: Warm dry intact with no signs of infection or rash on extremities or on axial skeleton.  Abdomen: Soft nontender  Neuro: Cranial nerves II through XII are intact, neurovascularly intact in all extremities with 2+ DTRs and 2+ pulses.  Lymph: No lymphadenopathy of posterior or anterior cervical chain or axillae bilaterally.  Gait normal with good balance and coordination.  MSK:  Non tender with full range of motion and good stability and symmetric strength and tone of  elbows, wrist, hip, knee and ankles bilaterally.  Shoulder: Right Inspection reveals no abnormalities, atrophy or asymmetry. Palpation is normal with no tenderness over AC joint or bicipital groove. Patient on palpation though is moderately tender over the insertion of the middle deltoid. ROM is full in all planes passively. Rotator cuff strength normal throughout. signs of impingement with positive Neer and Hawkin's tests, but negative empty can sign. Speeds and Yergason's tests normal. No labral pathology noted with negative Obrien's, negative clunk and good stability. Normal scapular function observed. No painful arc and no drop arm sign. No apprehension sign Deltoid is mildly tender on exam today.       Impression and Recommendations:     This case required medical decision making of moderate complexity.

## 2014-10-19 NOTE — Assessment & Plan Note (Signed)
Still melena patient is having more of a rotator cuff arthropathy. We discussed with patient at great length. Patient feels that this could still be a side effect from her flu shot. Unfortunately that would mean this is more of an inclusion body myositis. We did not see any signs of that previously. Patient will get x-rays secondary to her history of squamous cell skin cancer. I'm guessing this will show osteophytic changes. After this patient will try compression sleeve, icing, topical anti-inflammatories. Patient will also given a low dose of gabapentin at night. Patient will try the conservative therapy and come back in 3 weeks. Continuing to have difficulty and I would consider another crit a steroid injection because patient did respond of this previously.  Spent greater than 25 minutes with patient face-to-face and had greater than 50% of counseling including as described above in assessment and plan.

## 2014-10-19 NOTE — Patient Instructions (Addendum)
Good to see you Ice still is your friend Continue the pennsaid twice daily.  Get a compression sleeve and war it daily.  Try gabapentin 100mg  at night.  Xray downstairs today. See me again xmas week and if still in pain we will try an injection.

## 2014-10-26 ENCOUNTER — Encounter: Payer: Self-pay | Admitting: Family Medicine

## 2014-11-06 ENCOUNTER — Other Ambulatory Visit (INDEPENDENT_AMBULATORY_CARE_PROVIDER_SITE_OTHER): Payer: Managed Care, Other (non HMO)

## 2014-11-06 ENCOUNTER — Encounter: Payer: Self-pay | Admitting: Family Medicine

## 2014-11-06 ENCOUNTER — Ambulatory Visit (INDEPENDENT_AMBULATORY_CARE_PROVIDER_SITE_OTHER): Payer: Managed Care, Other (non HMO) | Admitting: Family Medicine

## 2014-11-06 VITALS — BP 138/86 | HR 70 | Ht 63.0 in | Wt 174.0 lb

## 2014-11-06 DIAGNOSIS — M12812 Other specific arthropathies, not elsewhere classified, left shoulder: Secondary | ICD-10-CM

## 2014-11-06 DIAGNOSIS — M25512 Pain in left shoulder: Secondary | ICD-10-CM

## 2014-11-06 DIAGNOSIS — M75102 Unspecified rotator cuff tear or rupture of left shoulder, not specified as traumatic: Secondary | ICD-10-CM

## 2014-11-06 DIAGNOSIS — M12512 Traumatic arthropathy, left shoulder: Secondary | ICD-10-CM

## 2014-11-06 NOTE — Progress Notes (Signed)
[name] [address] [place], [place] [address] Phone: [phone] Subjective:     CC: Left shoulder pain follow-up  [id number] Hannah Bryant is a 68 y.o. female coming in with complaint of left shoulder pain. Patient was seen previously and was diagnosed with a degenerative joint of the left shoulder. Patient also had a rotator cuff tear arthropathy and degenerative changes of the before meals joint.. Patient has had one steroid shot previously. Patient states that did help for approximately 2 months and started to get worse again. Patient states that the pain is still with certain range of motion testing. Patient would like to remain active. Patient denies any new symptoms. States he has worsening of previous symptoms. Does not feel that she is responding well to the gabapentin. X-rays are reviewed by me. X-rays taken previously did show the patient does have a bone cyst proximally in the area patient does have pain. Discussed with radiologist's secondary to her having a history of squamous cell carcinoma. We are in agreement that this is a bone cyst.    Past medical history, social, surgical and family history all reviewed in electronic medical record.   Review of Systems: No headache, visual changes, nausea, vomiting, diarrhea, constipation, dizziness, abdominal pain, skin rash, fevers, chills, night sweats, weight loss, swollen lymph nodes, body aches, joint swelling, muscle aches, chest pain, shortness of breath, mood changes.   Objective Blood pressure 138/86, pulse 70, height 5\' 3"  (1.6 m), weight 174 lb (78.926 kg), last menstrual period 11/18/2000, SpO2 97 %.  General: No apparent distress alert and oriented x3 mood and affect normal, dressed appropriately.  HEENT: Pupils equal, extraocular movements intact  Respiratory: Patient's speak in full sentences and does not appear short of breath  Cardiovascular: No lower extremity edema, non  tender, no erythema  Skin: Warm dry intact with no signs of infection or rash on extremities or on axial skeleton.  Abdomen: Soft nontender  Neuro: Cranial nerves II through XII are intact, neurovascularly intact in all extremities with 2+ DTRs and 2+ pulses.  Lymph: No lymphadenopathy of posterior or anterior cervical chain or axillae bilaterally.  Gait normal with good balance and coordination.  MSK:  Non tender with full range of motion and good stability and symmetric strength and tone of  elbows, wrist, hip, knee and ankles bilaterally.  Shoulder: Left Inspection reveals no abnormalities, atrophy or asymmetry. Palpation is normal with no tenderness over AC joint or bicipital groove.  ROM is full in all planes passively. Rotator cuff strength  4 out of 5 compared to 5 out of 5 on the contralateral side signs of impingement with positive Neer and Hawkin's tests, but negative empty can sign. Speeds and Yergason's tests normal. No labral pathology noted with negative Obrien's, negative clunk and good stability. Normal scapular function observed. No painful arc and no drop arm sign. No apprehension sign Deltoid is mildly tender on exam today.  Procedure: Real-time Ultrasound Guided Injection of left glenohumeral joint Device: GE Logiq E  Ultrasound guided injection is preferred based studies that show increased duration, increased effect, greater accuracy, decreased procedural pain, increased response rate with ultrasound guided versus blind injection.  Verbal informed consent obtained.  Time-out conducted.  Noted no overlying erythema, induration, or other signs of local infection.  Skin prepped in a sterile fashion.  Local anesthesia: Topical Ethyl chloride.  With sterile technique and under real time ultrasound guidance:  Joint visualized.  23g 1  inch  needle inserted posterior approach. Pictures taken for needle placement. Patient did have injection of 2 cc of 1% lidocaine, 2 cc of  0.5% Marcaine, and 1cc of Kenalog 40 mg/dL. Completed without difficulty  Pain immediately resolved suggesting accurate placement of the medication.  Advised to call if fevers/chills, erythema, induration, drainage, or persistent bleeding.  Images permanently stored and available for review in the ultrasound unit.  Impression: Technically successful ultrasound guided injection.  Procedure: Real-time Ultrasound Guided Injection of left before meals joint Device: GE Logiq E  Ultrasound guided injection is preferred based studies that show increased duration, increased effect, greater accuracy, decreased procedural pain, increased response rate, and decreased cost with ultrasound guided versus blind injection.  Verbal informed consent obtained.  Time-out conducted.  Noted no overlying erythema, induration, or other signs of local infection.  Skin prepped in a sterile fashion.  Local anesthesia: Topical Ethyl chloride.  With sterile technique and under real time ultrasound guidance:  With a 25-gauge 5 inch needle patient was injected with 0.5 mL of 0.5% Marcaine and 0.5 mL of Kenalog 41 g/dL. Completed without difficulty  Pain immediately resolved suggesting accurate placement of the medication.  Advised to call if fevers/chills, erythema, induration, drainage, or persistent bleeding.  Images permanently stored and available for review in the ultrasound unit.  Impression: Technically successful ultrasound guided injection.    Impression and Recommendations:     This case required medical decision making of moderate complexity.

## 2014-11-06 NOTE — Assessment & Plan Note (Addendum)
Discussed again with patient at length. We discussed conservative therapy versus possible surgical intervention. Patient would like to remain active and avoid surgery at all cost. Patient was given another injection today. We discussed icing regimen. We discussed home exercises. We discussed the possibility of formal physical therapy and patient is willing to do this. Referral placed today. Patient will come back in 3 weeks and we will discuss further treatment options.  Spent greater than 25 minutes with patient face-to-face and had greater than 50% of counseling including as described above in assessment and plan.

## 2014-11-06 NOTE — Patient Instructions (Signed)
Good to see you Hannah Bryant is your friend. Physical therapy will be calling you.  2 injections today which should help Happy holidays and birthday! See me again in 3 weeks.

## 2014-11-07 ENCOUNTER — Other Ambulatory Visit: Payer: Self-pay | Admitting: Gastroenterology

## 2014-11-15 ENCOUNTER — Other Ambulatory Visit: Payer: Self-pay | Admitting: Gastroenterology

## 2014-11-15 NOTE — Telephone Encounter (Signed)
NEEDS OFFICE VISIT AFTER REFILLS ARE OUT.

## 2014-11-23 ENCOUNTER — Ambulatory Visit: Payer: Managed Care, Other (non HMO) | Attending: Family Medicine | Admitting: Physical Therapy

## 2014-11-23 DIAGNOSIS — R29898 Other symptoms and signs involving the musculoskeletal system: Secondary | ICD-10-CM

## 2014-11-23 DIAGNOSIS — M25512 Pain in left shoulder: Secondary | ICD-10-CM | POA: Insufficient documentation

## 2014-11-23 NOTE — Therapy (Addendum)
[place], [place], [address] Phone: [phone]   Fax:  [phone]  Physical Therapy Evaluation  Patient Details  Name: Hannah Bryant MRN: [id number] Date of Birth: May 19, 1946 Referring Provider:  Lyndal Pulley, DO  Encounter Date: 11/23/2014      PT End of Session - 11/23/14 0920    Visit Number 1   Number of Visits 3   Date for PT Re-Evaluation 12/23/14   PT Start Time 0845   PT Stop Time 0918   PT Time Calculation (min) 33 min   Activity Tolerance Patient tolerated treatment well   Behavior During Therapy [facility] for tasks assessed/performed      Past Medical History  Diagnosis Date  . GERD (gastroesophageal reflux disease)   . Hyperlipidemia   . Gastroparesis     93 % retenton at 2 hrs  . Hernia     hiatal  . 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 Arthritis   . Diverticulosis   . Bursitis     tronchanteric  . hypoglycemia   . Abnormal finding on EKG     non specific T changes; short PR  . Hyperlipidemia   . Complication of anesthesia     Severe PONV  . Hiatal hernia   . Diabetes mellitus without complication     Pt states she is pre-diabetic (12-06-13)  . H/O dilation and curettage     Past Surgical History  Procedure Laterality Date  . Hiatal hernia repair  2000  . Leg surgery      Trauma LLE/pins and plates  . Colonoscopy w/ polypectomy  2006 & 2007    negative 2009; Dr Fuller Plan  . Tubal ligation    . Cardiac catheterization  2005    < 30 % lesion  . G 3 p 3    . Myomectomy  1999    uterine fibroid  . Dilatation & curettage/hysteroscopy with trueclear N/A 01/11/2014    Procedure: DILATATION & CURETTAGE/HYSTEROSCOPY WITH TRUCLEAR;  Surgeon: Anastasio Auerbach, MD;  Location: [facility];  Service: Gynecology;  Laterality: N/A;    LMP 11/18/2000  Visit Diagnosis:  Left shoulder pain - Plan: PT plan of care cert/re-cert  Weakness of shoulder -  Plan: PT plan of care cert/re-cert      Subjective Assessment - 11/23/14 0849    Symptoms Pt is a 69 y/o female who presents to OPPT for L shoulder pain and rotator cuff injury.  Pt reports no pain x 2-3 weeks following injection.  Pt reports initial injury following flu shot in Sept.    Limitations House hold activities   Patient Stated Goals no goals, "I'm fine"   Currently in Pain? No/denies          [facility] PT Assessment - 11/23/14 0852    Assessment   Medical Diagnosis L shoulder bursitis and bone spur, mild OA   Onset Date 07/18/14  approx   Prior Therapy PT for ankle therapy   Precautions   Precautions None   Restrictions   Weight Bearing Restrictions No   Balance Screen   Has the patient fallen in the past 6 months No   Has the patient had a decrease in activity level because of a fear of falling?  No   Is the patient reluctant to leave their home because of a fear of falling?  No   Prior Function   Level of Independence Independent with  basic ADLs;Independent with gait;Independent with transfers   Vocation Retired   [employer]   Overall Cognitive Status Within Functional Limits for tasks assessed   Observation/Other Assessments   Focus on Therapeutic Outcomes (FOTO)  pt refused   Posture/Postural Control   Posture/Postural Control Postural limitations   Postural Limitations Rounded Shoulders;Forward head;Increased thoracic kyphosis   AROM   Overall AROM Comments WFL bil without pain   Strength   Right Shoulder Flexion 5/5   Right Shoulder Extension 5/5   Right Shoulder ABduction 4+/5   Right Shoulder Internal Rotation 4+/5   Right Shoulder External Rotation 3+/5   Left Shoulder Flexion 4+/5   Left Shoulder Extension 4+/5   Left Shoulder ABduction 3+/5   Left Shoulder Internal Rotation 4/5   Left Shoulder External Rotation 3/5   Right Elbow Flexion 5/5   Right Elbow Extension 5/5   Left Elbow Flexion 5/5   Left Elbow Extension 5/5                   [facility] Adult PT Treatment/Exercise - 11/23/14 0852    Shoulder Exercises: Standing   External Rotation Strengthening;Left;15 reps;Theraband   Theraband Level (Shoulder External Rotation) Level 3 (Green)   Internal Rotation Strengthening;Left;15 reps;Theraband   Theraband Level (Shoulder Internal Rotation) Level 3 (Green)   Flexion Left;15 reps;Theraband   Theraband Level (Shoulder Flexion) Level 3 (Green)   ABduction Left;15 reps;Theraband   Theraband Level (Shoulder ABduction) Level 3 (Green)   Extension Left;15 reps;Theraband   Theraband Level (Shoulder Extension) Level 3 Nyoka Cowden)                PT Education - 11/23/14 0920    Education provided Yes   Education Details HEP   Person(s) Educated Patient   Methods Explanation;Demonstration   Comprehension Verbalized understanding;Returned demonstration             PT Long Term Goals - 11/23/14 0923    PT LONG TERM GOAL #1   Title to be determined if pt returns               Plan - 11/23/14 0920    Clinical Impression Statement Pt presents to OPPT with history of L shoulder pain, now resolved.  Pt does present with L shoulder weakness and provided HEP to address deficits.  Pt requesting no further appointntments be scheduled at this time and would like to only return if needed.  Goals to be set if pt returns.   Pt will benefit from skilled therapeutic intervention in order to improve on the following deficits Decreased strength;Pain;Postural dysfunction   Rehab Potential Good   PT Frequency 1x / week   PT Duration 4 weeks  will see PRN if pt needs to return   PT Treatment/Interventions ADLs/Self Care Home Management;Therapeutic activities;Therapeutic exercise;Functional mobility training;Electrical Stimulation;Cryotherapy;Manual techniques;Ultrasound;Passive range of motion;Patient/family education;Moist Heat   PT Next Visit Plan if pt returns, review HEP and progress/update as needed   Consulted and Agree with  Plan of Care Patient         Problem List Patient Active Problem List   Diagnosis Date Noted  . Left rotator cuff tear arthropathy 08/21/2014  . Squamous cell skin cancer 10/06/2013  . Type II or unspecified type diabetes mellitus without mention of complication, not stated as uncontrolled 07/02/2011  . HYPOGLYCEMIA, REACTIVE 02/04/2011  . BENIGN POSITIONAL VERTIGO 08/27/2009  . NONSPECIFIC ABNORM RESULTS THYROID FUNCT STUDY 07/13/2009  . TUBULOVILLOUS ADENOMA, COLON, HX OF 05/09/2008  . Gastroparesis 11/15/2007  .  DIVERTICULOSIS, COLON 11/15/2007  . POSTMENOPAUSAL STATUS 08/19/2007  . GASTROESOPHAGEAL REFLUX DISEASE, SEVERE 05/12/2007  . DYSPHAGIA, UNSPECIFIED 05/12/2007  . Nonspecific abnormal electrocardiogram (ECG) (EKG) 05/12/2007  . HYPERLIPIDEMIA 05/07/2007   Laureen Abrahams, PT, DPT 11/23/2014 9:37 AM  [facility] [address] [place], [place], [address] Phone: [phone]   Fax:  [phone]

## 2014-11-23 NOTE — Patient Instructions (Signed)
Strengthening: Resisted Internal Rotation   Hold tubing in left hand, elbow at side and forearm out. Rotate forearm in across body. Repeat _15___ times per set. Do __1__ sets per session. Do _2-3___ sessions per day.  http://orth.exer.us/830   Copyright  VHI. All rights reserved.  Strengthening: Resisted External Rotation   Hold tubing in left hand, elbow at side and forearm across body. Rotate forearm out. Repeat __15__ times per set. Do __1__ sets per session. Do __2-3__ sessions per day.  http://orth.exer.us/828   Copyright  VHI. All rights reserved.  Strengthening: Resisted Flexion   Hold tubing with left arm at side. Pull forward and up. Move shoulder through pain-free range of motion. Repeat _15___ times per set. Do _1___ sets per session. Do __2-3__ sessions per day.  http://orth.exer.us/824   Copyright  VHI. All rights reserved.  Strengthening: Resisted Extension   Hold tubing in left hand, arm forward. Pull arm back, elbow straight. Repeat __15__ times per set. Do __1_ sets per session. Do _2-3___ sessions per day.  http://orth.exer.us/832   Copyright  VHI. All rights reserved.   Strengthening: Resisted Abduction   Hold tubing with left arm across body. Stand on tubing.  Pull up and away from side. Move through pain-free range of motion. Repeat _15___ times per set. Do _1__ sets per session. Do _2-3___ sessions per day.  http://orth.exer.us/827   Copyright  VHI. All rights reserved.   Laureen Abrahams, PT, DPT 11/23/2014 9:14 AM  [facility] [address],  [address]  [phone] (office) [phone] (fax)

## 2014-11-27 ENCOUNTER — Ambulatory Visit (INDEPENDENT_AMBULATORY_CARE_PROVIDER_SITE_OTHER): Payer: Managed Care, Other (non HMO) | Admitting: Internal Medicine

## 2014-11-27 ENCOUNTER — Encounter: Payer: Self-pay | Admitting: Internal Medicine

## 2014-11-27 ENCOUNTER — Telehealth: Payer: Self-pay | Admitting: Internal Medicine

## 2014-11-27 ENCOUNTER — Ambulatory Visit (INDEPENDENT_AMBULATORY_CARE_PROVIDER_SITE_OTHER): Payer: Managed Care, Other (non HMO) | Admitting: Family Medicine

## 2014-11-27 ENCOUNTER — Encounter: Payer: Self-pay | Admitting: Family Medicine

## 2014-11-27 VITALS — BP 120/86 | HR 69 | Ht 63.0 in | Wt 169.0 lb

## 2014-11-27 VITALS — BP 120/86 | HR 69 | Temp 98.2°F | Ht 63.0 in | Wt 169.2 lb

## 2014-11-27 DIAGNOSIS — M12512 Traumatic arthropathy, left shoulder: Secondary | ICD-10-CM

## 2014-11-27 DIAGNOSIS — Z0000 Encounter for general adult medical examination without abnormal findings: Secondary | ICD-10-CM

## 2014-11-27 DIAGNOSIS — Z86010 Personal history of colonic polyps: Secondary | ICD-10-CM

## 2014-11-27 DIAGNOSIS — E785 Hyperlipidemia, unspecified: Secondary | ICD-10-CM

## 2014-11-27 DIAGNOSIS — Z0189 Encounter for other specified special examinations: Secondary | ICD-10-CM

## 2014-11-27 DIAGNOSIS — R946 Abnormal results of thyroid function studies: Secondary | ICD-10-CM

## 2014-11-27 DIAGNOSIS — M75102 Unspecified rotator cuff tear or rupture of left shoulder, not specified as traumatic: Principal | ICD-10-CM

## 2014-11-27 DIAGNOSIS — R1310 Dysphagia, unspecified: Secondary | ICD-10-CM

## 2014-11-27 DIAGNOSIS — M12812 Other specific arthropathies, not elsewhere classified, left shoulder: Secondary | ICD-10-CM

## 2014-11-27 MED ORDER — HYOSCYAMINE SULFATE 0.125 MG SL SUBL: SUBLINGUAL_TABLET | SUBLINGUAL | Status: DC

## 2014-11-27 MED ORDER — ESOMEPRAZOLE MAGNESIUM 40 MG PO CPDR: 40.0000 mg | DELAYED_RELEASE_CAPSULE | Freq: Every day | ORAL | Status: DC

## 2014-11-27 NOTE — Assessment & Plan Note (Signed)
CBC

## 2014-11-27 NOTE — Assessment & Plan Note (Signed)
NMR Lipoprofile, LFTs, TSH  

## 2014-11-27 NOTE — Telephone Encounter (Signed)
[facility] is their contracted Center of Excellence for GI [facility], [facility] or [facility]) ?

## 2014-11-27 NOTE — Patient Instructions (Addendum)
Your next office appointment will be determined based upon review of your pending labs  Those instructions will be transmitted to you through My Chart  Pease report any significant change in your symptoms Please contact your insurance company to determine which is the center of excellence on your plan concerning treating your GI symptoms

## 2014-11-27 NOTE — Assessment & Plan Note (Signed)
A1c , urine microalbumin, BMET 

## 2014-11-27 NOTE — Assessment & Plan Note (Signed)
Discussed with patient again at great length. Encourage patient to continue the exercises on a regular basis. Patient has gone to formal physical therapy and did find it somewhat helpful. Lungs patient continues to do well and continuously icing I believe the patient will follow-up on an as-needed basis. Patient will likely need repeat injections over the course of time secondary to the arthritis.

## 2014-11-27 NOTE — Assessment & Plan Note (Signed)
As per Dr Fuller Plan, GI

## 2014-11-27 NOTE — Progress Notes (Signed)
  [name] [place] [place], [place] [address] Phone: [phone] Subjective:     CC: Left shoulder pain follow-up  TTS:[id number] ILIYANA Bryant is a 69 y.o. female coming in with complaint of left shoulder pain. Patient was seen previously and was diagnosed with a degenerative joint of the left shoulder as well as acromioclavicular joint arthritis. . Patient was seen previously month ago and was given an injection in the shoulder as well as the acromial clavicular joint. Patient states since that time she has been pain-free. Patient states that she has a mild discomfort with certain motions but overall still able to do any activity she feels fit. Patient is able to comb her hair as well as dressing doing her activities of daily living as well as sleep comfortably at night. Patient is very happy with the results.    Past medical history, social, surgical and family history all reviewed in electronic medical record.   Review of Systems: No headache, visual changes, nausea, vomiting, diarrhea, constipation, dizziness, abdominal pain, skin rash, fevers, chills, night sweats, weight loss, swollen lymph nodes, body aches, joint swelling, muscle aches, chest pain, shortness of breath, mood changes.   Objective Blood pressure 120/86, pulse 69, height 5\' 3"  (1.6 m), weight 169 lb (76.658 kg), last menstrual period 11/18/2000.  General: No apparent distress alert and oriented x3 mood and affect normal, dressed appropriately.  HEENT: Pupils equal, extraocular movements intact  Respiratory: Patient's speak in full sentences and does not appear short of breath  Cardiovascular: No lower extremity edema, non tender, no erythema  Skin: Warm dry intact with no signs of infection or rash on extremities or on axial skeleton.  Abdomen: Soft nontender  Neuro: Cranial nerves II through XII are intact, neurovascularly intact in all extremities with 2+ DTRs and 2+ pulses.    Lymph: No lymphadenopathy of posterior or anterior cervical chain or axillae bilaterally.  Gait normal with good balance and coordination.  MSK:  Non tender with full range of motion and good stability and symmetric strength and tone of  elbows, wrist, hip, knee and ankles bilaterally.  Shoulder: Left Inspection reveals no abnormalities, atrophy or asymmetry. Patient still has a mild discomfort over the acromial clavicular joint ROM is full in all planes passively. Rotator cuff strength  4 out of 5 compared to 5 out of 5 on the contralateral side Negative impingement signs today Speeds and Yergason's tests normal. No labral pathology noted with negative Obrien's, negative clunk and good stability. Normal scapular function observed. No painful arc and no drop arm sign. No apprehension sign     Impression and Recommendations:     This case required medical decision making of moderate complexity.

## 2014-11-27 NOTE — Progress Notes (Signed)
Pre visit review using our clinic review tool, if applicable. No additional management support is needed unless otherwise documented below in the visit note. 

## 2014-11-27 NOTE — Progress Notes (Signed)
Subjective:    Patient ID: Hannah Bryant, female    DOB: [birth year], 69 y.o.   MRN: [id number]  HPI  She is here for a physical;acute issues are delineated below.  PMH, FH, & Social History reviewed & updated. She's been compliant with her PPI and statin without adverse effect  She's on a modified low-carb diet. She's lost 15 pounds; she questions that it was not purposeful. She continues to have intermittent severe cramping abdominal pain in the lower abdomen lasting  up to 45 minutes with nausea & vomiting. She has loose - watery stools thereafter for several hours. Occasionally this is related to dramatic swings in her glucose but not always. Levsin is of questionable benefit.Marland Kitchen  Her colonoscopy 01/20/14 revealed 2 tubular adenomas, one hyperplastic polyp, and one sessile serrated polyp.  She does have occasional dysphagia 1-2 times per week.  She works with a Clinical research associate once a week and also exercises 2-3 times per week with no cardiopulmonary symptoms.  Review of Systems   Chest pain, palpitations, tachycardia, exertional dyspnea, paroxysmal nocturnal dyspnea, claudication or edema are absent.  Melena, rectal bleeding, or persistently small caliber stools are denied.  No memory issues or myalgias.  Dysuria, pyuria, hematuria, frequency, nocturia or polyuria are denied.     Objective:   Physical Exam  Gen.: Healthy and well-nourished in appearance. Alert, appropriate and cooperative throughout exam. Appears younger than stated age  Head: Normocephalic without obvious abnormalities  Eyes: No corneal or conjunctival inflammation noted. Pupils equal round reactive to light and accommodation. Extraocular motion intact.  Ears: External  ear exam reveals no significant lesions or deformities. Canals clear .TMs normal. Hearing is grossly normal bilaterally. Nose: External nasal exam reveals no deformity or inflammation. Nasal mucosa are mildly erythematous. No lesions or exudates  noted.  Septum to L. Mouth: Oral mucosa and oropharynx reveal no lesions or exudates. Teeth in good repair. Neck: No deformities, masses, or tenderness noted. Range of motion & Thyroid normal.. Lungs: Normal respiratory effort; chest expands symmetrically. Lungs are clear to auscultation without rales, wheezes, or increased work of breathing. Heart: Normal rate and rhythm. Normal S1 and S2. No gallop, click, or rub. No murmur. Abdomen: Bowel sounds normal; abdomen soft and nontender. No masses, organomegaly or hernias noted. Genitalia:  as per Gyn                                  Musculoskeletal/extremities: Accentuated curvature of upper thoracic spine. No clubbing, cyanosis, edema, or significant extremity  deformity noted.  Range of motion normal . Tone & strength normal. Hand joints normal Fingernail health good. Crepitus of knees  Able to lie down & sit up w/o help.  Negative SLR bilaterally Vascular: Carotid, radial artery, dorsalis pedis and  posterior tibial pulses are full and equal. No bruits present. Neurologic: Alert and oriented x3. Deep tendon reflexes symmetrical and normal.  Gait normal    Skin: Intact without suspicious lesions or rashes. Lymph: No cervical, axillary lymphadenopathy present. Psych: Mood and affect are normal. Normally interactive  Assessment & Plan:   #1 comprehensive physical exam; no acute findings #2 chronic ,recurrent abdominal pain Plan: see Orders  & Recommendations Referral to [facility] discussed

## 2014-11-27 NOTE — Telephone Encounter (Signed)
Received a call from [place] member services. Patient was inquiring to "institutes of quality" for GI. Holland Falling states that she is not familiar with such wording. She is requesting that we clarify what Dr. Linna Darner means by "institute of quality" and where exactly he intended. Will call the patient once clarified to advise.

## 2014-11-27 NOTE — Patient Instructions (Signed)
Good to see you You are doing great Ice when you need it Continue the exercises 3 times a week.  See me again when you need me.  Dr. Garret Reddish at our [place] location.

## 2014-11-27 NOTE — Assessment & Plan Note (Signed)
TSH 

## 2014-11-28 ENCOUNTER — Other Ambulatory Visit: Payer: Self-pay | Admitting: Internal Medicine

## 2014-11-28 DIAGNOSIS — R109 Unspecified abdominal pain: Secondary | ICD-10-CM | POA: Insufficient documentation

## 2014-11-28 NOTE — Telephone Encounter (Signed)
Holland Falling is advising that there aren't any centers of excellence for the GI specialty in any of our local hospitals. They have [facility] and [facility] listed as centers of excellence, but not in that specialty. Did you have somewhere specific in mind?

## 2014-11-28 NOTE — Telephone Encounter (Signed)
I'll refer to [facility] & see

## 2014-12-01 ENCOUNTER — Other Ambulatory Visit: Payer: Self-pay | Admitting: Internal Medicine

## 2014-12-01 ENCOUNTER — Other Ambulatory Visit (INDEPENDENT_AMBULATORY_CARE_PROVIDER_SITE_OTHER): Payer: Managed Care, Other (non HMO)

## 2014-12-01 DIAGNOSIS — Z0000 Encounter for general adult medical examination without abnormal findings: Secondary | ICD-10-CM

## 2014-12-01 DIAGNOSIS — Z0189 Encounter for other specified special examinations: Secondary | ICD-10-CM

## 2014-12-01 LAB — CBC WITH DIFFERENTIAL/PLATELET
Basophils Absolute: 0 10*3/uL (ref 0.0–0.1)
Basophils Relative: 0.3 % (ref 0.0–3.0)
Eosinophils Absolute: 0.1 10*3/uL (ref 0.0–0.7)
Eosinophils Relative: 1.7 % (ref 0.0–5.0)
HCT: 43.6 % (ref 36.0–46.0)
Hemoglobin: 14.2 g/dL (ref 12.0–15.0)
Lymphocytes Relative: 37.1 % (ref 12.0–46.0)
Lymphs Abs: 2.7 10*3/uL (ref 0.7–4.0)
MCHC: 32.5 g/dL (ref 30.0–36.0)
MCV: 97.3 fl (ref 78.0–100.0)
Monocytes Absolute: 0.4 10*3/uL (ref 0.1–1.0)
Monocytes Relative: 6.2 % (ref 3.0–12.0)
Neutro Abs: 3.9 10*3/uL (ref 1.4–7.7)
Neutrophils Relative %: 54.7 % (ref 43.0–77.0)
PLATELETS: 155 10*3/uL (ref 150.0–400.0)
RBC: 4.49 Mil/uL (ref 3.87–5.11)
RDW: 13.3 % (ref 11.5–15.5)
WBC: 7.2 10*3/uL (ref 4.0–10.5)

## 2014-12-01 LAB — BASIC METABOLIC PANEL
BUN: 14 mg/dL (ref 6–23)
CALCIUM: 9.4 mg/dL (ref 8.4–10.5)
CO2: 29 meq/L (ref 19–32)
Chloride: 106 mEq/L (ref 96–112)
Creatinine, Ser: 0.78 mg/dL (ref 0.40–1.20)
GFR: 77.6 mL/min (ref >60.00)
Glucose, Bld: 106 mg/dL — ABNORMAL HIGH (ref 70–99)
Potassium: 4.3 mEq/L (ref 3.5–5.1)
Sodium: 144 mEq/L (ref 135–145)

## 2014-12-01 LAB — HEPATIC FUNCTION PANEL
ALT: 20 U/L (ref 0–35)
AST: 18 U/L (ref 0–37)
Albumin: 4 g/dL (ref 3.5–5.2)
Alkaline Phosphatase: 57 U/L (ref 39–117)
Bilirubin, Direct: 0.1 mg/dL (ref 0.0–0.3)
Total Bilirubin: 0.6 mg/dL (ref 0.2–1.2)
Total Protein: 6.9 g/dL (ref 6.0–8.3)

## 2014-12-01 LAB — HEMOGLOBIN A1C: Hgb A1c MFr Bld: 6.9 % — ABNORMAL HIGH (ref 4.6–6.5)

## 2014-12-01 LAB — TSH: TSH: 1.35 u[IU]/mL (ref 0.35–4.50)

## 2014-12-05 LAB — NMR LIPOPROFILE WITH LIPIDS
Cholesterol, Total: 161 mg/dL (ref 100–199)
HDL PARTICLE NUMBER: 40.7 umol/L (ref >=30.5)
HDL Size: 9.4 nm (ref >=9.2)
HDL-C: 73 mg/dL (ref >39)
LARGE HDL: 10 umol/L (ref >=4.8)
LARGE VLDL-P: 1.9 nmol/L (ref <=2.7)
LDL CALC: 69 mg/dL (ref 0–99)
LDL Particle Number: 894 nmol/L (ref <1000)
LDL Size: 20.7 nm (ref >=20.8)
LP-IR Score: 31 (ref <=45)
Small LDL Particle Number: 281 nmol/L (ref <=527)
Triglycerides: 93 mg/dL (ref 0–149)
VLDL Size: 44.6 nm (ref <=46.6)

## 2014-12-19 ENCOUNTER — Encounter: Payer: Self-pay | Admitting: Women's Health

## 2014-12-20 ENCOUNTER — Other Ambulatory Visit: Payer: Self-pay | Admitting: Dermatology

## 2014-12-23 ENCOUNTER — Encounter: Payer: Self-pay | Admitting: Internal Medicine

## 2014-12-23 DIAGNOSIS — D2272 Melanocytic nevi of left lower limb, including hip: Secondary | ICD-10-CM | POA: Insufficient documentation

## 2014-12-25 ENCOUNTER — Encounter: Payer: Self-pay | Admitting: Internal Medicine

## 2014-12-25 ENCOUNTER — Ambulatory Visit (INDEPENDENT_AMBULATORY_CARE_PROVIDER_SITE_OTHER): Payer: Managed Care, Other (non HMO) | Admitting: Internal Medicine

## 2014-12-25 VITALS — BP 178/112 | HR 78 | Temp 98.0°F | Ht 63.0 in | Wt 165.2 lb

## 2014-12-25 DIAGNOSIS — H6981 Other specified disorders of Eustachian tube, right ear: Secondary | ICD-10-CM

## 2014-12-25 DIAGNOSIS — R030 Elevated blood-pressure reading, without diagnosis of hypertension: Secondary | ICD-10-CM

## 2014-12-25 MED ORDER — MECLIZINE HCL 25 MG PO TABS: 25.0000 mg | ORAL_TABLET | Freq: Three times a day (TID) | ORAL | Status: DC | PRN

## 2014-12-25 MED ORDER — HYDROCHLOROTHIAZIDE 12.5 MG PO CAPS: 12.5000 mg | ORAL_CAPSULE | Freq: Every day | ORAL | Status: DC

## 2014-12-25 NOTE — Progress Notes (Signed)
   Subjective:    Patient ID: Hannah Bryant, female    DOB: 09-29-1946, 69 y.o.   MRN: [id number]  HPI  After showering 12/22/14 @ approximately 10:30 am; she felt water in her right ear. She attempted to relieve the sensation by  jumping up and down. She experienced immediate dizziness which is been recurrent since but which has improved somewhat.  Flexing her neck or turning her head laterally can aggravate symptoms  She's had some associated nausea.  She has no symptoms of upper respiratory tract infection   Review of Systems  Frontal headache, facial pain , nasal purulence, dental pain, sore throat , otic pain or otic discharge denied. No fever , chills or sweats.     Objective:   Physical Exam  Positive or pertinent findings include: She expressed some discomfort with neck flexion and head rotation with subjective  nausea. The right tympanic membrane is dull with diffuse light reflex.  General appearance:Adequately nourished; no acute distress or increased work of breathing is present.  No  lymphadenopathy about the head, neck, or axilla noted.  Eyes: No conjunctival inflammation or lid edema is present. There is no scleral icterus.EOMI w/o nystagmus Ears:  External ear exam shows no significant lesions or deformities.  Otoscopic examination reveals clear canals, tympanic membranes are intact bilaterally without bulging, retraction, inflammation or discharge. Nose:  External nasal examination shows no deformity or inflammation. Nasal mucosa are pink and moist without lesions or exudates. No septal dislocation or deviation.No obstruction to airflow.  Oral exam: Dental hygiene is good; lips and gums are healthy appearing.There is no oropharyngeal erythema or exudate noted.  Neck:  No deformities, thyromegaly, masses, or tenderness noted.   Supple with full range of motion without pain.  Heart:  Normal rate and regular rhythm. S1 and S2 normal without gallop, murmur, click, rub or  other extra sounds.  Lungs:Chest clear to auscultation; no wheezes, rhonchi,rales ,or rubs present. Extremities:  No cyanosis, edema, or clubbing  noted  Skin: Warm & dry w/o jaundice or tenting. Neurologic exam : Strength equal & normal in upper & lower extremities DTRs WNL     Assessment & Plan:  #1 eustachian tube dysfunction causing dizziness & nausea #2 HTN  Plan: as per protocol in [facility]

## 2014-12-25 NOTE — Patient Instructions (Addendum)
Go to [facility] for eustachian tube dysfunction. Drink thin  fluids liberally through the day and chew sugarless gum . Do the Valsalva maneuver several times a day to "pop" ears open. Nasacort AQ 1 spray in each nostril twice a day as needed. Use the "crossover" technique as discussed. Use a Neti pot daily- 2x /day  as needed for sinus congestion   Minimal Blood Pressure Goal= AVERAGE < 140/90;  Ideal is an AVERAGE < 135/85. This AVERAGE should be calculated from @ least 5-7 BP readings taken @ different times of day on different days of week. You should not respond to isolated BP readings , but rather the AVERAGE for that week .Please bring your  blood pressure cuff to office visits to verify that it is reliable.It  can also be checked against the blood pressure device at the pharmacy. Finger or wrist cuffs are not dependable; an arm cuff is.

## 2014-12-25 NOTE — Progress Notes (Signed)
Pre visit review using our clinic review tool, if applicable. No additional management support is needed unless otherwise documented below in the visit note. 

## 2015-01-02 ENCOUNTER — Encounter: Payer: Managed Care, Other (non HMO) | Admitting: Women's Health

## 2015-01-16 ENCOUNTER — Ambulatory Visit (INDEPENDENT_AMBULATORY_CARE_PROVIDER_SITE_OTHER): Payer: Managed Care, Other (non HMO) | Admitting: Women's Health

## 2015-01-16 ENCOUNTER — Telehealth: Payer: Self-pay | Admitting: *Deleted

## 2015-01-16 ENCOUNTER — Encounter: Payer: Self-pay | Admitting: Women's Health

## 2015-01-16 VITALS — BP 160/80 | Ht 63.0 in | Wt 168.0 lb

## 2015-01-16 DIAGNOSIS — Z01419 Encounter for gynecological examination (general) (routine) without abnormal findings: Secondary | ICD-10-CM | POA: Diagnosis not present

## 2015-01-16 DIAGNOSIS — E162 Hypoglycemia, unspecified: Secondary | ICD-10-CM

## 2015-01-16 NOTE — Telephone Encounter (Signed)
-----   Message from Huel Cote, NP sent at 01/16/2015  1:31 PM EST ----- Please schedule appointment for Dr. Margreta Journey [facility]) endocrinologist to evaluate hypoglycemia.

## 2015-01-16 NOTE — Telephone Encounter (Signed)
Referral placed, Dr.Gherge office will contact patient to schedule.

## 2015-01-16 NOTE — Progress Notes (Signed)
Hannah Bryant 02-17-1946 [id number]    History:    Presents for annual exam.  Postmenopausal with no bleeding. 12/2013 had benign endometrial polyp removed hysteroscopically by Dr. Phineas Real. Normal Pap and mammogram history. 01/2014 for benign colon polyps. Current on vaccines. Continues to have problems with low blood sugars and GI/diarrhea problems. Has follow-up with physician at [place] for digestive problems. Requesting endocrinologist referral. Osteopenia managed by primary care T score -1.6  In 2013, no fractures.   Past medical history, past surgical history, family history and social history were all reviewed and documented in the EPIC chart. 2 children both doing well.  ROS:  A ROS was performed and pertinent positives and negatives are included.  Exam:  Filed Vitals:   01/16/15 1053  BP: 160/80    General appearance:  Normal Thyroid:  Symmetrical, normal in size, without palpable masses or nodularity. Respiratory  Auscultation:  Clear without wheezing or rhonchi Cardiovascular  Auscultation:  Regular rate, without rubs, murmurs or gallops  Edema/varicosities:  Not grossly evident Abdominal  Soft,nontender, without masses, guarding or rebound.  Liver/spleen:  No organomegaly noted  Hernia:  None appreciated  Skin  Inspection:  Grossly normal   Breasts: Examined lying and sitting.     Right: Without masses, retractions, discharge or axillary adenopathy.     Left: Without masses, retractions, discharge or axillary adenopathy. Gentitourinary   Inguinal/mons:  Normal without inguinal adenopathy  External genitalia:  Normal  BUS/Urethra/Skene's glands:  Normal  Vagina:  Normal  Cervix:  Normal  Uterus:   normal in size, shape and contour.  Midline and mobile  Adnexa/parametria:     Rt: Without masses or tenderness.   Lt: Without masses or tenderness.  Anus and perineum: Normal  Digital rectal exam: Normal sphincter tone without palpated masses or  tenderness  Assessment/Plan:  69 y.o. MWF G2P2 for annual exam with problems of vertigo, digestive problems/diarrhea, hypoglycemia most afternoons.  Postmenopausal/no bleeding/no HRT GERD/gastroparesis/hypercholesterolemia/vertigo-primary care manages labs and meds  Plan: Will schedule referral for endocrinologist to evaluate hypoglycemia. SBE's, continue annual screening mammogram. Increase exercise and decrease calories as able for weight loss. Home safety, fall prevention discussed. Vitamin D 1000 encourage daily. UA: Pap screening guidelines reviewed.  Huel Cote WHNP, 1:22 PM 01/16/2015

## 2015-01-16 NOTE — Patient Instructions (Signed)
Hypoglycemia Hypoglycemia occurs when the glucose in your blood is too low. Glucose is a type of sugar that is your body's main energy source. Hormones, such as insulin and glucagon, control the level of glucose in the blood. Insulin lowers blood glucose and glucagon increases blood glucose. Having too much insulin in your blood stream, or not eating enough food containing sugar, can result in hypoglycemia. Hypoglycemia can happen to people with or without diabetes. It can develop quickly and can be a medical emergency.  CAUSES   Missing or delaying meals.  Not eating enough carbohydrates at meals.  Taking too much diabetes medicine.  Not timing your oral diabetes medicine or insulin doses with meals, snacks, and exercise.  Nausea and vomiting.  Certain medicines.  Severe illnesses, such as hepatitis, kidney disorders, and certain eating disorders.  Increased activity or exercise without eating something extra or adjusting medicines.  Drinking too much alcohol.  A nerve disorder that affects body functions like your heart rate, blood pressure, and digestion (autonomic neuropathy).  A condition where the stomach muscles do not function properly (gastroparesis). Therefore, medicines and food may not absorb properly.  Rarely, a tumor of the pancreas can produce too much insulin. SYMPTOMS   Hunger.  Sweating (diaphoresis).  Change in body temperature.  Shakiness.  Headache.  Anxiety.  Lightheadedness.  Irritability.  Difficulty concentrating.  Dry mouth.  Tingling or numbness in the hands or feet.  Restless sleep or sleep disturbances.  Altered speech and coordination.  Change in mental status.  Seizures or prolonged convulsions.  Combativeness.  Drowsiness (lethargic).  Weakness.  Increased heart rate or palpitations.  Confusion.  Pale, gray skin color.  Blurred or double vision.  Fainting. DIAGNOSIS  A physical exam and medical history will be  performed. Your caregiver may make a diagnosis based on your symptoms. Blood tests and other lab tests may be performed to confirm a diagnosis. Once the diagnosis is made, your caregiver will see if your signs and symptoms go away once your blood glucose is raised.  TREATMENT  Usually, you can easily treat your hypoglycemia when you notice symptoms.  Check your blood glucose. If it is less than 70 mg/dl, take one of the following:   3-4 glucose tablets.    cup juice.    cup regular soda.   1 cup skim milk.   -1 tube of glucose gel.   5-6 hard candies.   Avoid high-fat drinks or food that may delay a rise in blood glucose levels.  Do not take more than the recommended amount of sugary foods, drinks, gel, or tablets. Doing so will cause your blood glucose to go too high.   Wait 10-15 minutes and recheck your blood glucose. If it is still less than 70 mg/dl or below your target range, repeat treatment.   Eat a snack if it is more than 1 hour until your next meal.  There may be a time when your blood glucose may go so low that you are unable to treat yourself at home when you start to notice symptoms. You may need someone to help you. You may even faint or be unable to swallow. If you cannot treat yourself, someone will need to bring you to the hospital.  HOME CARE INSTRUCTIONS  If you have diabetes, follow your diabetes management plan by:  Taking your medicines as directed.  Following your exercise plan.  Following your meal plan. Do not skip meals. Eat on time.  Testing your blood   glucose regularly. Check your blood glucose before and after exercise. If you exercise longer or different than usual, be sure to check blood glucose more frequently.  Wearing your medical alert jewelry that says you have diabetes.  Identify the cause of your hypoglycemia. Then, develop ways to prevent the recurrence of hypoglycemia.  Do not take a hot bath or shower right after an  insulin shot.  Always carry treatment with you. Glucose tablets are the easiest to carry.  If you are going to drink alcohol, drink it only with meals.  Tell friends or family members ways to keep you safe during a seizure. This may include removing hard or sharp objects from the area or turning you on your side.  Maintain a healthy weight. SEEK MEDICAL CARE IF:   You are having problems keeping your blood glucose in your target range.  You are having frequent episodes of hypoglycemia.  You feel you might be having side effects from your medicines.  You are not sure why your blood glucose is dropping so low.  You notice a change in vision or a new problem with your vision. SEEK IMMEDIATE MEDICAL CARE IF:   Confusion develops.  A change in mental status occurs.  The inability to swallow develops.  Fainting occurs. Document Released: 11/03/2005 Document Revised: 11/08/2013 Document Reviewed: 03/01/2012 [facility] Patient Information 2015 [place], [place]. This information is not intended to replace advice given to you by your health care provider. Make sure you discuss any questions you have with your health care provider. Health Recommendations for Postmenopausal Women Respected and ongoing research has looked at the most common causes of death, disability, and poor quality of life in postmenopausal women. The causes include heart disease, diseases of blood vessels, diabetes, depression, cancer, and bone loss (osteoporosis). Many things can be done to help lower the chances of developing these and other common problems. CARDIOVASCULAR DISEASE Heart Disease: A heart attack is a medical emergency. Know the signs and symptoms of a heart attack. Below are things women can do to reduce their risk for heart disease.   Do not smoke. If you smoke, quit.  Aim for a healthy weight. Being overweight causes many preventable deaths. Eat a healthy and balanced diet and drink an adequate amount of  liquids.  Get moving. Make a commitment to be more physically active. Aim for 30 minutes of activity on most, if not all days of the week.  Eat for heart health. Choose a diet that is low in saturated fat and cholesterol and eliminate trans fat. Include whole grains, vegetables, and fruits. Read and understand the labels on food containers before buying.  Know your numbers. Ask your caregiver to check your blood pressure, cholesterol (total, HDL, LDL, triglycerides) and blood glucose. Work with your caregiver on improving your entire clinical picture.  High blood pressure. Limit or stop your table salt intake (try salt substitute and food seasonings). Avoid salty foods and drinks. Read labels on food containers before buying. Eating well and exercising can help control high blood pressure. STROKE  Stroke is a medical emergency. Stroke may be the result of a blood clot in a blood vessel in the brain or by a brain hemorrhage (bleeding). Know the signs and symptoms of a stroke. To lower the risk of developing a stroke:  Avoid fatty foods.  Quit smoking.  Control your diabetes, blood pressure, and irregular heart rate. THROMBOPHLEBITIS (BLOOD CLOT) OF THE LEG  Becoming overweight and leading a stationary lifestyle may  also contribute to developing blood clots. Controlling your diet and exercising will help lower the risk of developing blood clots. CANCER SCREENING  Breast Cancer: Take steps to reduce your risk of breast cancer.  You should practice "breast self-awareness." This means understanding the normal appearance and feel of your breasts and should include breast self-examination. Any changes detected, no matter how small, should be reported to your caregiver.  After age 33, you should have a clinical breast exam (CBE) every year.  Starting at age 75, you should consider having a mammogram (breast X-ray) every year.  If you have a family history of breast cancer, talk to your caregiver  about genetic screening.  If you are at high risk for breast cancer, talk to your caregiver about having an MRI and a mammogram every year.  Intestinal or Stomach Cancer: Tests to consider are a rectal exam, fecal occult blood, sigmoidoscopy, and colonoscopy. Women who are high risk may need to be screened at an earlier age and more often.  Cervical Cancer:  Beginning at age 75, you should have a Pap test every 3 years as long as the past 3 Pap tests have been normal.  If you have had past treatment for cervical cancer or a condition that could lead to cancer, you need Pap tests and screening for cancer for at least 20 years after your treatment.  If you had a hysterectomy for a problem that was not cancer or a condition that could lead to cancer, then you no longer need Pap tests.  If you are between ages 13 and 45, and you have had normal Pap tests going back 10 years, you no longer need Pap tests.  If Pap tests have been discontinued, risk factors (such as a new sexual partner) need to be reassessed to determine if screening should be resumed.  Some medical problems can increase the chance of getting cervical cancer. In these cases, your caregiver may recommend more frequent screening and Pap tests.  Uterine Cancer: If you have vaginal bleeding after reaching menopause, you should notify your caregiver.  Ovarian Cancer: Other than yearly pelvic exams, there are no reliable tests available to screen for ovarian cancer at this time except for yearly pelvic exams.  Lung Cancer: Yearly chest X-rays can detect lung cancer and should be done on high risk women, such as cigarette smokers and women with chronic lung disease (emphysema).  Skin Cancer: A complete body skin exam should be done at your yearly examination. Avoid overexposure to the sun and ultraviolet light lamps. Use a strong sun block cream when in the sun. All of these things are important for lowering the risk of skin  cancer. MENOPAUSE Menopause Symptoms: Hormone therapy products are effective for treating symptoms associated with menopause:  Moderate to severe hot flashes.  Night sweats.  Mood swings.  Headaches.  Tiredness.  Loss of sex drive.  Insomnia.  Other symptoms. Hormone replacement carries certain risks, especially in older women. Women who use or are thinking about using estrogen or estrogen with progestin treatments should discuss that with their caregiver. Your caregiver will help you understand the benefits and risks. The ideal dose of hormone replacement therapy is not known. The Food and Drug Administration (FDA) has concluded that hormone therapy should be used only at the lowest doses and for the shortest amount of time to reach treatment goals.  OSTEOPOROSIS Protecting Against Bone Loss and Preventing Fracture If you use hormone therapy for prevention of bone loss (osteoporosis),  the risks for bone loss must outweigh the risk of the therapy. Ask your caregiver about other medications known to be safe and effective for preventing bone loss and fractures. To guard against bone loss or fractures, the following is recommended:  If you are younger than age 50, take 1000 mg of calcium and at least 600 mg of Vitamin D per day.  If you are older than age 55 but younger than age 95, take 1200 mg of calcium and at least 600 mg of Vitamin D per day.  If you are older than age 85, take 1200 mg of calcium and at least 800 mg of Vitamin D per day. Smoking and excessive alcohol intake increases the risk of osteoporosis. Eat foods rich in calcium and vitamin D and do weight bearing exercises several times a week as your caregiver suggests. DIABETES Diabetes Mellitus: If you have type I or type 2 diabetes, you should keep your blood sugar under control with diet, exercise, and recommended medication. Avoid starchy and fatty foods, and too many sweets. Being overweight can make diabetes control  more difficult. COGNITION AND MEMORY Cognition and Memory: Menopausal hormone therapy is not recommended for the prevention of cognitive disorders such as Alzheimer's disease or memory loss.  DEPRESSION  Depression may occur at any age, but it is common in elderly women. This may be because of physical, medical, social (loneliness), or financial problems and needs. If you are experiencing depression because of medical problems and control of symptoms, talk to your caregiver about this. Physical activity and exercise may help with mood and sleep. Community and volunteer involvement may improve your sense of value and worth. If you have depression and you feel that the problem is getting worse or becoming severe, talk to your caregiver about which treatment options are best for you. ACCIDENTS  Accidents are common and can be serious in elderly woman. Prepare your house to prevent accidents. Eliminate throw rugs, place hand bars in bath, shower, and toilet areas. Avoid wearing high heeled shoes or walking on wet, snowy, and icy areas. Limit or stop driving if you have vision or hearing problems, or if you feel you are unsteady with your movements and reflexes. HEPATITIS C Hepatitis C is a type of viral infection affecting the liver. It is spread mainly through contact with blood from an infected person. It can be treated, but if left untreated, it can lead to severe liver damage over the years. Many people who are infected do not know that the virus is in their blood. If you are a "baby-boomer", it is recommended that you have one screening test for Hepatitis C. IMMUNIZATIONS  Several immunizations are important to consider having during your senior years, including:   Tetanus, diphtheria, and pertussis booster shot.  Influenza every year before the flu season begins.  Pneumonia vaccine.  Shingles vaccine.  Others, as indicated based on your specific needs. Talk to your caregiver about  these. Document Released: 12/26/2005 Document Revised: 03/20/2014 Document Reviewed: 08/21/2008 [facility] Patient Information 2015 [place], [place]. This information is not intended to replace advice given to you by your health care provider. Make sure you discuss any questions you have with your health care provider.

## 2015-01-18 NOTE — Telephone Encounter (Signed)
appointment 02/02/15 @ 8:15 am

## 2015-01-22 DIAGNOSIS — K5730 Diverticulosis of large intestine without perforation or abscess without bleeding: Secondary | ICD-10-CM | POA: Insufficient documentation

## 2015-01-22 DIAGNOSIS — R197 Diarrhea, unspecified: Secondary | ICD-10-CM | POA: Insufficient documentation

## 2015-01-26 ENCOUNTER — Other Ambulatory Visit: Payer: Self-pay | Admitting: Internal Medicine

## 2015-01-29 ENCOUNTER — Other Ambulatory Visit: Payer: Self-pay | Admitting: Internal Medicine

## 2015-02-02 ENCOUNTER — Ambulatory Visit (INDEPENDENT_AMBULATORY_CARE_PROVIDER_SITE_OTHER): Payer: Managed Care, Other (non HMO) | Admitting: Internal Medicine

## 2015-02-02 ENCOUNTER — Encounter: Payer: Self-pay | Admitting: Internal Medicine

## 2015-02-02 ENCOUNTER — Other Ambulatory Visit (INDEPENDENT_AMBULATORY_CARE_PROVIDER_SITE_OTHER): Payer: Managed Care, Other (non HMO) | Admitting: *Deleted

## 2015-02-02 ENCOUNTER — Telehealth: Payer: Self-pay | Admitting: Internal Medicine

## 2015-02-02 VITALS — BP 114/76 | HR 65 | Temp 97.9°F | Resp 12 | Ht 63.0 in | Wt 167.4 lb

## 2015-02-02 DIAGNOSIS — E11649 Type 2 diabetes mellitus with hypoglycemia without coma: Secondary | ICD-10-CM

## 2015-02-02 LAB — CORTISOL
CORTISOL PLASMA: 24.2 ug/dL
Cortisol, Plasma: 7.1 ug/dL
Cortisol, Plasma: 29.1 ug/dL

## 2015-02-02 MED ORDER — COSYNTROPIN 0.25 MG IJ SOLR
0.2500 mg | Freq: Once | INTRAMUSCULAR | Status: AC
  Administered 2015-02-02: 0.25 mg via INTRAMUSCULAR

## 2015-02-02 MED ORDER — COSYNTROPIN 0.25 MG IJ SOLR: 0.2500 mg | Freq: Once | INTRAMUSCULAR | Status: DC

## 2015-02-02 NOTE — Progress Notes (Signed)
Patient ID: Hannah Bryant, female   DOB: [birth year], 69 y.o.   MRN: [id number]  HPI: Hannah Bryant is a 69 y.o.-year-old female, referred by her PCP, Dr. Linna Darner, for management of DM2, dx in ~2009 (or earlier), diet-controlled, with complications (reactive hypoglycemia). Previously seen by Dr. Chalmers Cater.  Last hemoglobin A1c was: Lab Results  Component Value Date   HGBA1C 6.9* 12/01/2014   HGBA1C 6.9* 12/05/2013   HGBA1C 6.9* 10/22/2012   Pt is not on meds for her DM2. She has been on Metformin >> felt poorly on it.   She has hypoglycemia every pm for "ages". She may not have sxs with the episodes. Sometimes: sweating, feeling hot, nausea, sometimes vomiting. She may need to take a nap afterward. She has stomach cramps in the pm and she then has diarrhea. She does not have this every day. She lost 20 lbs in last year.   She had a steroid inj in shoulder in 10/2014. She had another one in 08/2014. She also had steroid inj for hip bursitis in 2006. She has been on Prednisone courses over the years.   Pt checks her sugars 7-10 a day and they are: - am: 99-116 - 2h after b'fast: n/c - before lunch: n/c - after lunch: 150s- 130s - 90s - 60s - 50 (31 is lowest) - takes OJ repeatedly - before dinner: n/c - 2h after dinner: n/c - bedtime: n/c - nighttime: n/c No lows. Lowest sugar was 31 (felt it); she has hypoglycemia awareness at 80.  Highest sugar was 200.  Glucometer: 2: OneTouch Verio and Freestyle Lite  Pt's meals are: - Breakfast: oatmeal, blueberries, walnuts - mid-am snack: PB crackers x 2 - Lunch: soup or salad + chicken or cheese + ham, sometimes milk 1/2 glass (skim) - mid-pm snack: milk or peanuts or crackers - Dinner: chicken/pork + stirfry veggies or salad = brown rice. No sodas, teas.  She saw nutrition 02/2014. At that time, she started a a modified low carb diet >> lost 15 lbs. She was having hypoglycemia after lunch and mid-pm snack at that time.  - no CKD,  last BUN/creatinine:  Lab Results  Component Value Date   BUN 14 12/01/2014   CREATININE 0.78 12/01/2014   - last set of lipids: Lab Results  Component Value Date   CHOL 161 12/01/2014   HDL 73 12/01/2014   LDLCALC 69 12/01/2014   TRIG 93 12/01/2014   CHOLHDL 3 12/05/2013  On Lipitor. Pt has FH of DM in father.  ROS: Constitutional: + weight loss (not completely intentional), +  fatigue, no subjective hyperthermia/hypothermia Eyes: no blurry vision, no xerophthalmia ENT: no sore throat, no nodules palpated in throat, no dysphagia/odynophagia, no hoarseness Cardiovascular: no CP/SOB/palpitations/leg swelling Respiratory: no cough/SOB Gastrointestinal: no N/V/D/C Musculoskeletal: no muscle/joint aches Skin: no rashes Neurological: no tremors/numbness/tingling/dizziness Psychiatric: no depression/anxiety   History   Social History  . Marital Status: Married    Spouse Name: N/A  . Number of Children: 2   Social History Main Topics  . Smoking status: Former Smoker    Types: Cigarettes    Quit date: 11/17/1980  . Smokeless tobacco: Never Used     Comment: smoked 1962-1982, up to 1 ppd  . Alcohol Use: 1.2 oz/week    2 Glasses of wine per week     Comment: socially,2 / week  . Drug Use: No  . Sexual Activity: Yes    Birth Control/ Protection: Post-menopausal   Social History Narrative  Daily caffeine use   Married   Veterinary surgeon intermittently    Past Medical History  Diagnosis Date  . GERD (gastroesophageal reflux disease)   . Hyperlipidemia   . Gastroparesis     93 % [place] at 2 hrs  . Hernia     hiatal  . 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 Arthritis   . Diverticulosis   . Bursitis     tronchanteric  . hypoglycemia   . Abnormal finding on EKG     non specific T changes; short PR  . Hyperlipidemia   . Complication of anesthesia     Severe PONV  . Hiatal hernia   . Diabetes mellitus without  complication     Pt states she is pre-diabetic (12-06-13)  . H/O dilation and curettage    Past Surgical History  Procedure Laterality Date  . Hiatal hernia repair  2000  . Leg surgery      Trauma LLE/pins and plates  . Colonoscopy w/ polypectomy  2006 ,2007 & 2015    negative 2009; Dr Fuller Plan  . Tubal ligation    . Cardiac catheterization  2005    < 30 % lesion  . G 3 p 3    . Myomectomy  1999    uterine fibroid  . Dilatation & curettage/hysteroscopy with trueclear N/A 01/11/2014    Procedure: DILATATION & CURETTAGE/HYSTEROSCOPY WITH TRUCLEAR;  Surgeon: Anastasio Auerbach, MD;  Location: [facility];  Service: Gynecology;  Laterality: N/A;   Current Outpatient Prescriptions on File Prior to Visit  Medication Sig Dispense Refill  . acetaminophen (TYLENOL ARTHRITIS PAIN) 650 MG CR tablet Take 650 mg by mouth. 2 by mouth 2 times daily     . aspirin 81 MG chewable tablet Chew 81 mg by mouth daily.    Marland Kitchen atorvastatin (LIPITOR) 40 MG tablet TAKE ONE TABLET BY MOUTH ONE TIME DAILY  30 tablet 5  . Calcium-Vitamin D-Vitamin K (VIACTIV PO) Take by mouth. Take 2 by mouth once a day     . Diclofenac Sodium 2 % SOLN Apply twice daily. 112 g 1  . esomeprazole (NEXIUM) 40 MG capsule Take 1 capsule (40 mg total) by mouth daily. 30 capsule 2  . fexofenadine (ALLEGRA) 180 MG tablet Take 180 mg by mouth daily.      Marland Kitchen gabapentin (NEURONTIN) 100 MG capsule Take 1 capsule by mouth daily.    Marland Kitchen glucose blood (FREESTYLE LITE) test strip 1 each by Other route. Rx'ed by Dr.Balan    . hydrochlorothiazide (MICROZIDE) 12.5 MG capsule TAKE 1 CAPSULE (12.5 MG TOTAL) BY MOUTH DAILY. 30 capsule 5  . hyoscyamine (LEVSIN SL) 0.125 MG SL tablet DISSOLVE ONE OR TWO TABLETS UNDER TONGUE EVERY FOUR TO SIX HOURS AS NEEDED for abdominal pain and cramping. 60 tablet 1  . Lancets MISC by Does not apply route. Lifespan ultra 2 test strips and lancets     . meclizine (ANTIVERT) 25 MG tablet Take 1 tablet (25 mg total) by mouth 3 (three)  times daily as needed for dizziness. 15 tablet 0  . montelukast (SINGULAIR) 10 MG tablet TAKE ONE TABLET BY MOUTH DAILY AS NEEDED  30 tablet 2  . OVER THE COUNTER MEDICATION multistrain probiotic     Current Facility-Administered Medications on File Prior to Visit  Medication Dose Route Frequency Provider Last Rate Last Dose  . lidocaine (XYLOCAINE) 1 % (with pres) injection 10 mL  10 mL Infiltration Once [employer]  Corey Skains, MD       Allergies  Allergen Reactions  . Domperidone     Stomach cramps  . Monistat [Miconazole Nitrate]     SEVERE BURNING  . Nsaids     REACTION: stomach complaints  . Oxycodone Hcl     REACTION: VOMITTING  . Codeine     nausea  . Erythromycin Ethylsuccinate     gastritis   Family History  Problem Relation Age of Onset  . Ulcers Father   . Diabetes Father   . Hypertension Father   . Heart attack Father     > 27  . Stroke Father     in 81s  . Heart attack Mother 75  . Diabetes Sister   . Heart attack Sister 54    CABG X 4  . Cancer Neg Hx   . Colitis Neg Hx    PE: BP 114/76 mmHg  Pulse 65  Temp(Src) 97.9 F (36.6 C) (Oral)  Resp 12  Ht 5\' 3"  (1.6 m)  Wt 167 lb 6.4 oz (75.932 kg)  BMI 29.66 kg/m2  SpO2 96%  LMP 11/18/2000 Wt Readings from Last 3 Encounters:  02/02/15 167 lb 6.4 oz (75.932 kg)  01/16/15 168 lb (76.204 kg)  12/25/14 165 lb 4 oz (74.957 kg)   Constitutional: overweight, in NAD Eyes: PERRLA, EOMI, no exophthalmos ENT: moist mucous membranes, no thyromegaly, no cervical lymphadenopathy Cardiovascular: RRR, No MRG Respiratory: CTA B Gastrointestinal: abdomen soft, NT, ND, BS+ Musculoskeletal: no deformities, strength intact in all 4 Skin: moist, warm, no rashes Neurological: no tremor with outstretched hands, DTR normal in all 4  ASSESSMENT: 1. Diet-controlled DM2, without complications.  2. Reactive Hypoglycemia  PLAN:  1. DM2 - not on meds - well controlled, except for hypoglycemia episodes - last 3 HbA1c  levels 6.9%  2. Patient with history of reactive (postprandial) hypoglycemia, with almost daily low CBG levels, almost exclusively in pm, after lunch. She almost always have liquids with that meal (soup, skim milk). - We discussed about what the notion of "sugar crash" means and that it can be a normal reaction to a high glycemic index food. I advised her to stay with low glycemic index foods - given web link for reference and also given her a printed table with GI of foods.  - In pts with insulin resistance, for example prediabetic pts or well controlled diabetic pts, there can be a mismatch between the sugar increase after a meal and pancreatic insulin production. Insulin is secreted in the circulation to late and in too large quantities >> hypoglycemia.  - I advised her to not have liquid meals, and to drink fluids only between meals. Also, to start the meals with proteins and, if she has carbs, to also include some fat. I suggested to also see nutrition, which she would like to do >> referral placed - We also need to rule out possible endocrine conditions that may cause hypoglycemia: will check for adrenal insufficiency (cosyntropin stim test) especially since she also has weight loss  >> will do this today  Orders Placed This Encounter  Procedures  . Cortisol    Draw at time 0 min  . ACTH    Draw at time 0 min  . Cortisol    Draw at time 30 min  . Cortisol    Draw at time 60 min  . Amb ref to Medical Nutrition Therapy-MNT    Referral Priority:  Routine    Referral Type:  Consultation    Referral Reason:  Specialty Services Required    Requested Specialty:  Nutrition    Number of Visits Requested:  1   - time spent with the patient: 1 hour, of which >50% was spent in obtaining information about her symptoms, reviewing her previous labs, evaluations, and treatments, counseling her about her hypoglycemia management (please see the discussed topics above), and developing a plan to further  investigate it and avoid it;  she had a number of questions which I addressed.  Office Visit on 02/02/2015  Component Date Value Ref Range Status  . Cortisol, Plasma 02/02/2015 7.1   Final   AM:  4.3 - 22.4 ug/dLPM:  3.1 - 16.7 ug/dL  . X646 ACTH 02/02/2015 12  6 - 50 pg/mL Final   Comment: Reference range applies only to specimens collected between 7am-10am   . Cortisol, Plasma 02/02/2015 24.2   Final   AM:  4.3 - 22.4 ug/dLPM:  3.1 - 16.7 ug/dL  . Cortisol, Plasma 02/02/2015 29.1   Final   AM:  4.3 - 22.4 ug/dLPM:  3.1 - 16.7 ug/dL   Cosyntropin stimulation test is normal. ACTH is also normal. No signs of adrenal insufficiency.

## 2015-02-02 NOTE — Patient Instructions (Addendum)
Please review the following website - stay with the lower glycemic load foods: Www.glycemicindex.com  Have 3 meals a day + 2-3 snacks.  Eat as much fruit and veggies as you can. Berries, pears, apples, peaches, apricots - are the best.  Do not drink liquids with a meal, separate them by at least 30 min.   Start the meal with protein and fat and end with carbs.  Stop drinking skim milk as it has a lot of sugar. Can try alond milk instead or 2% milk.  Carry a snack with you everywhere. Best - to contain ~15 g of carbs.  I will refer you to nutrition. Please schedule an appt with Antonieta Iba in our office.   Please schedule a return appt in 3 months.  Please consider using foods with a low glycemic load - see table below [facility]).  December 20, 2013  Glycemic index and glycemic load for 100+ foods Glycemic index and glycemic load offer information about how foods affect blood sugar and insulin. The lower a food's glycemic index or glycemic load, the less it affects blood sugar and insulin levels. Here is a list of the glycemic index and glycemic load for more than 100 common foods. FOOD Glycemic index (glucose = 100) Serving size (grams) Glycemic load per serving  BAKERY PRODUCTS AND BREADS     Banana cake, made with sugar 47 60 14  Banana cake, made without sugar 55 60 12  Sponge cake, plain 46 63 17  Vanilla cake made from packet mix with vanilla frosting (Betty Crocker) 42 111 24  Apple, made with sugar 44 60 13  Apple, made without sugar 48 60 9  Waffles, Aunt Jemima (Quaker Oats) 76 35 10  Bagel, white, frozen 72 70 25  Baguette, white, plain 95 30 15  Coarse barley bread, 75-80% kernels, average 34 30 7  Hamburger bun 61 30 9  Kaiser roll 73 30 12  Pumpernickel bread 56 30 7  50% cracked wheat kernel bread 58 30 12  White wheat flour bread 71 30 10  Wonder" bread, average 73 30 10  Whole wheat bread, average 71 30 9  100% Whole Grain bread (Natural Ovens) 51 30 7   Pita bread, white 68 30 10  Corn tortilla 52 50 12  Wheat tortilla 30 50 8  BEVERAGES     Coca Cola, average 63 250 mL 16  Fanta, orange soft drink 68 250 mL 23  Lucozade, original (sparkling glucose drink) 9510 250 mL 40  Apple juice, unsweetened, average 44 250 mL 30  Cranberry juice cocktail (Ocean Spray) 68 250 mL 24  Gatorade 78 250 mL 12  Orange juice, unsweetened 50 250 mL 12  Tomato juice, canned 38 250 mL 4  BREAKFAST CEREALS AND RELATED PRODUCTS     All-Bran, average 55 30 12  Coco Pops, average 77 30 20  Cornflakes, average 93 30 23  Cream of Wheat (Nabisco) 66 250 17  Cream of Wheat, Instant (Nabisco) 74 250 22  Grapenuts, average 75 30 16  Muesli, average 66 30 16  Oatmeal, average 55 250 13  Instant oatmeal, average 83 250 30  Puffed wheat, average 80 30 17  Raisin Bran (Kellogg's) 61 30 12  Special K (Kellogg's) 69 30 14  GRAINS     Pearled barley, average 28 150 12  Sweet corn on the cob, average 60 150 20  Couscous, average 65 150 9  Quinoa 53 150 13  White rice,  average 89 150 43  Quick cooking white basmati 67 150 28  Brown rice, average 50 150 16  Converted, white rice (Uncle Ben's) 38 150 14  Whole wheat kernels, average 30 50 11  Bulgur, average 48 150 12  COOKIES AND CRACKERS     Graham crackers 74 25 14  Vanilla wafers 77 25 14  Shortbread 64 25 10  Rice cakes, average 82 25 17  Rye crisps, average 64 25 11  Soda crackers 74 25 12  DAIRY PRODUCTS AND ALTERNATIVES     Ice cream, regular 57 50 6  Ice cream, premium 38 50 3  Milk, full fat 41 262mL 5  Milk, skim 32 250 mL 4  Reduced-fat yogurt with fruit, average 33 200 11  FRUITS     Apple, average 39 120 6  Banana, ripe 62 120 16  Dates, dried 42 60 18  Grapefruit 25 120 3  Grapes, average 59 120 11  Orange, average 40 120 4  Peach, average 42 120 5  Peach, canned in light syrup 40 120 5  Pear, average 38 120 4  Pear, canned in pear juice 43 120 5  Prunes, pitted 29 60 10   Raisins 64 60 28  Watermelon 72 120 4  BEANS AND NUTS     Baked beans, average 40 150 6  Blackeye peas, average 33 150 10  Black beans 30 150 7  Chickpeas, average 10 150 3  Chickpeas, canned in brine 38 150 9  Navy beans, average 31 150 9  Kidney beans, average 29 150 7  Lentils, average 29 150 5  Soy beans, average 15 150 1  Cashews, salted 27 50 3  Peanuts, average 7 50 0  PASTA and NOODLES     Fettucini, average 32 180 15  Macaroni, average 47 180 23  Macaroni and Cheese (Kraft) 64 180 32  Spaghetti, white, boiled, average 46 180 22  Spaghetti, white, boiled 20 min, average 58 180 26  Spaghetti, wholemeal, boiled, average 42 180 17  SNACK FOODS     Corn chips, plain, salted, average 42 50 11  Fruit Roll-Ups 99 30 24  M & M's, peanut 33 30 6  Microwave popcorn, plain, average 55 20 6  Potato chips, average 51 50 12  Pretzels, oven-baked 83 30 16  Snickers Bar 51 60 18  VEGETABLES     Green peas, average 51 80 4  Carrots, average 35 80 2  Parsnips 52 80 4  Baked russet potato, average 111 150 33  Boiled white potato, average 82 150 21  Instant mashed potato, average 87 150 17  Sweet potato, average 70 150 22  Yam, average 54 150 20  MISCELLANEOUS     Hummus (chickpea salad dip) 6 30 0  Chicken nuggets, frozen, reheated in microwave oven 5 min 46 100 7  Pizza, plain baked dough, served with parmesan cheese and tomato sauce 80 100 22  Pizza, Super Supreme (Pizza Hut) 36 100 9  Honey, average 61 25 12  The complete list of the glycemic index and glycemic load for more than 1,000 foods can be found in the article "International tables of glycemic index and glycemic load values: 2008" by Illa Level. Candis Musa Foster-Powell, and Lesleigh Noe in the December 2008 issue of Diabetes Care, Vol. 31, number 12, pages 2281-2283.

## 2015-02-02 NOTE — Telephone Encounter (Signed)
Called pt and advised her that was a mistake. Called pharmacy and advised them to cancel. Done.

## 2015-02-02 NOTE — Telephone Encounter (Signed)
Please call pt for her to ask you about the injectible her pharmacy told her we rx for her?

## 2015-02-05 LAB — ACTH: C206 ACTH: 12 pg/mL (ref 6–50)

## 2015-02-15 ENCOUNTER — Encounter: Payer: Managed Care, Other (non HMO) | Attending: Internal Medicine | Admitting: Dietician

## 2015-02-15 VITALS — Ht 63.0 in | Wt 168.0 lb

## 2015-02-15 DIAGNOSIS — E11649 Type 2 diabetes mellitus with hypoglycemia without coma: Secondary | ICD-10-CM | POA: Diagnosis present

## 2015-02-15 DIAGNOSIS — Z713 Dietary counseling and surveillance: Secondary | ICD-10-CM | POA: Insufficient documentation

## 2015-02-15 NOTE — Progress Notes (Signed)
Medical Nutrition Therapy:  Appt start time: 1300 end time:  1430.   Assessment:  Primary concerns today: Patient has had diabetes for many year. Possibly 10-15 years but patient cannot remember.  States that she has had hypoglycemia her whole life and goes down as low as 30.  This happenes in the afternoon but has not happened for the past 2 weeks.   Patient checks her blood sugar about 7 times daily.  Her readings are 99-114 before breakfast and after meals 120-157 but low of 46 then as well on occasion.  She is here alone.  Patient wants to learn how to get better control of blood sugar to avoid hypoglycermia.  Patient states that the hypoglycermia has interfered with her life effecting traveling, eating out and doing things in the afternoon.  At times she will get severe stomach cramps and vomit. States that she is seeing a gastroenterologist next week to stretch her esophagus.  Eats only small amounts due to this problem.  HgbA1C consistently 6.9%.  Patient states that she has lost 20 lbs in the last year without trying.  Patient lives with husband.  Patient does the shopping and cooking.   Preferred Learning Style:   No preference indicated   Learning Readiness:   Ready  MEDICATIONS: see list.   DIETARY INTAKE: Can't swallow bread due to esophagus narrowing. 24-hr recall:   B (9AM): oatmeal, blueberries or strawberries, and walnuts and coffee with milk  Snk ( AM): 2 lance crackers with peanut butter or 1/2 package of special K crisp  L ( 12-1 PM): open faced sandwich on [employer] thins, [place] or ham and cheese Snk ( PM): 1/2 package of special K crisp bar or 2 lance peanut butter crackers and 1/2 apple and peanut butter D ( PM): meat, brown rice, veges or if she is not feeling well:  Twice baked potato or soup and 1/2 [employer] thin with meat and cheese Snk ( PM): none Beverages: 1 cup coffee with milk, water, skim milk,   Usual physical activity: goes to the gym once per week with trainer,  walks the dog 3 times per day 15-20 minutes.  Estimated energy needs: 1400 calories 158 g carbohydrates 88 g protein 47 g fat  Progress Towards Goal(s):  In progress.   Nutritional Diagnosis:  NB-1.1 Food and nutrition-related knowledge deficit As related to balance of carbohydrates, protein, and fat for control of DM and hypoglycermia.  As evidenced by evidence of hypoglycermia and diet hx.    Intervention:  Nutrition counseling and diabetes education initiated. Discussed Carb Counting by food group as method of portion control, reading food labels, and benefits of increased activity. Also discussed basic physiology of Diabetes, target BG ranges pre and post meals, and A1c.  . Plan:  Aim for 2-3 Carb Choices per meal (30-45 grams) +/- 1 either way  Aim for 0-2 Carbs per snack if hungry  Include protein in moderation with your meals and snacks Consider reading food labels for Total Carbohydrate and Fat Grams of foods Continue your exercise Consider checking BG at alternate times per day as directed by MD  Consider taking medication as directed by MD  Have an afternoon snack at 2:00 daily with protein and carbohydrates.    Teaching Method Utilized:  Visual Auditory Hands on  Handouts given during visit include:  Yellow meal plan card  Snack list  Label reading  Support group  Barriers to learning/adherence to lifestyle change: none  Demonstrated degree of understanding  via:  Teach Back   Monitoring/Evaluation:  Dietary intake, exercise, label reading, and body weight in 1 month(s).

## 2015-02-15 NOTE — Patient Instructions (Signed)
Plan:  Aim for 2-3 Carb Choices per meal (30-45 grams) +/- 1 either way  Aim for 0-2 Carbs per snack if hungry  Include protein in moderation with your meals and snacks Consider reading food labels for Total Carbohydrate and Fat Grams of foods Continue your exercise Consider checking BG at alternate times per day as directed by MD  Consider taking medication as directed by MD  Have an afternoon snack at 2:00 daily with protein and carbohydrates.

## 2015-02-20 HISTORY — PX: OTHER SURGICAL HISTORY: SHX169

## 2015-02-21 ENCOUNTER — Other Ambulatory Visit: Payer: Self-pay | Admitting: Internal Medicine

## 2015-03-15 ENCOUNTER — Encounter: Payer: Managed Care, Other (non HMO) | Attending: Internal Medicine | Admitting: Dietician

## 2015-03-15 VITALS — Wt 165.0 lb

## 2015-03-15 DIAGNOSIS — R1310 Dysphagia, unspecified: Secondary | ICD-10-CM

## 2015-03-15 DIAGNOSIS — E11649 Type 2 diabetes mellitus with hypoglycemia without coma: Secondary | ICD-10-CM | POA: Diagnosis present

## 2015-03-15 DIAGNOSIS — Z713 Dietary counseling and surveillance: Secondary | ICD-10-CM | POA: Insufficient documentation

## 2015-03-15 NOTE — Progress Notes (Signed)
Medical Nutrition Therapy:  Appt start time: 1300 end time:  1430.   Assessment:  02/15/15 Primary concerns today: Patient has had diabetes for many year. Possibly 10-15 years but patient cannot remember.  States that she has had hypoglycemia her whole life and goes down as low as 30.  This happenes in the afternoon but has not happened for the past 2 weeks.   Patient checks her blood sugar about 7 times daily.  Her readings are 99-114 before breakfast and after meals 120-157 but low of 46 then as well on occasion.  She is here alone.  Patient wants to learn how to get better control of blood sugar to avoid hypoglycermia.  Patient states that the hypoglycermia has interfered with her life effecting traveling, eating out and doing things in the afternoon.  At times she will get severe stomach cramps and vomit. States that she is seeing a gastroenterologist next week to stretch her esophagus.  Eats only small amounts due to this problem.  HgbA1C consistently 6.9%.  Patient states that she has lost 20 lbs in the last year without trying.  Patient lives with husband.  Patient does the shopping and cooking.   03/13/15: Patient is here for follow up alone today.  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  She is scheduled for an endoscopy in 1 week and an esophogram in 2 weeks.    She has been drinking hot water which she thinks helps.  GI MD feels the sphincter's not working properly could be a cause of the low blood sugars.  CBG was 167 this am.  Patient had about 1/2 cup of a cookout milkshake last night.  Weight is 165 lbs which is down 3 lbs in the past month.  Preferred Learning Style:   No preference indicated   Learning Readiness:   Ready  MEDICATIONS: see list.   DIETARY INTAKE: Continues to  have some difficulty swallowing 24-hr recall:   B (9AM): oatmeal, blueberries or strawberries, and walnuts and coffee with milk  Snk ( AM): 2 lance crackers with peanut butter or 1/2 package of special K crisp  L ( 12-1 PM): open faced sandwich on [employer] thins, [place] or ham and cheese Snk ( PM): 1/2 package of special K crisp bar or 2 lance peanut butter crackers and 1/2 apple and peanut butter D ( PM): meat, brown rice or potato, veges or if she is not feeling well:  Twice baked potato or soup and 1/2 [employer] thin with meat and cheese Snk ( PM): none Beverages: 1 cup coffee with milk, water, skim milk,   Usual physical activity: goes to the gym once per week with trainer, walks the dog 3 times per day 15-20 minutes.  Estimated energy needs: 1400 calories 158 g carbohydrates 88 g protein 47 g fat  Progress Towards Goal(s):  In progress.   Nutritional Diagnosis:  NB-1.1 Food and nutrition-related knowledge deficit As related to balance of carbohydrates, protein, and fat for control of DM and hypoglycermia.  As evidenced by evidence of hypoglycermia and diet hx.    Intervention:  Nutrition counseling regarding texture modifications of food as needed and nutritional supplement options as needed.  (Patient dislikes Ensure and to try sugar free [employer] essentials.  Discussed tips to prevent hypoglycemia.  Have an afternoon snack at 2:00 daily with protein and carbohydrates.    Teaching Method Utilized:  Visual Auditory Hands on  Barriers to learning/adherence to lifestyle change: none  Demonstrated degree of understanding via:  Teach Back   Monitoring/Evaluation:  Dietary intake, exercise, label reading, and body weight 6 weeks.

## 2015-03-19 ENCOUNTER — Other Ambulatory Visit: Payer: Self-pay | Admitting: Internal Medicine

## 2015-03-19 ENCOUNTER — Encounter: Payer: Self-pay | Admitting: Internal Medicine

## 2015-03-19 DIAGNOSIS — N390 Urinary tract infection, site not specified: Secondary | ICD-10-CM

## 2015-03-21 ENCOUNTER — Other Ambulatory Visit (INDEPENDENT_AMBULATORY_CARE_PROVIDER_SITE_OTHER): Payer: Managed Care, Other (non HMO)

## 2015-03-21 DIAGNOSIS — N390 Urinary tract infection, site not specified: Secondary | ICD-10-CM

## 2015-03-21 LAB — URINALYSIS
Bilirubin Urine: NEGATIVE
Hgb urine dipstick: NEGATIVE
Ketones, ur: NEGATIVE
Leukocytes, UA: NEGATIVE
NITRITE: NEGATIVE
SPECIFIC GRAVITY, URINE: 1.02 (ref 1.000–1.030)
Total Protein, Urine: NEGATIVE
UROBILINOGEN UA: 0.2 (ref 0.0–1.0)
Urine Glucose: NEGATIVE
pH: 6 (ref 5.0–8.0)

## 2015-03-22 HISTORY — PX: ESOPHAGEAL MANOMETRY: SHX1526

## 2015-03-28 ENCOUNTER — Other Ambulatory Visit: Payer: Self-pay | Admitting: Gastroenterology

## 2015-03-29 HISTORY — PX: OTHER SURGICAL HISTORY: SHX169

## 2015-04-23 ENCOUNTER — Ambulatory Visit: Payer: Managed Care, Other (non HMO) | Admitting: Dietician

## 2015-04-25 ENCOUNTER — Other Ambulatory Visit (INDEPENDENT_AMBULATORY_CARE_PROVIDER_SITE_OTHER): Payer: Managed Care, Other (non HMO)

## 2015-04-25 ENCOUNTER — Telehealth: Payer: Self-pay | Admitting: Internal Medicine

## 2015-04-25 ENCOUNTER — Encounter: Payer: Self-pay | Admitting: Internal Medicine

## 2015-04-25 DIAGNOSIS — N390 Urinary tract infection, site not specified: Secondary | ICD-10-CM | POA: Diagnosis not present

## 2015-04-25 DIAGNOSIS — R319 Hematuria, unspecified: Principal | ICD-10-CM

## 2015-04-25 DIAGNOSIS — O9989 Other specified diseases and conditions complicating pregnancy, childbirth and the puerperium: Principal | ICD-10-CM

## 2015-04-25 DIAGNOSIS — R8271 Bacteriuria: Principal | ICD-10-CM

## 2015-04-25 DIAGNOSIS — O99892 Other specified diseases and conditions complicating childbirth: Secondary | ICD-10-CM

## 2015-04-25 LAB — URINALYSIS, ROUTINE W REFLEX MICROSCOPIC
NITRITE: NEGATIVE
Specific Gravity, Urine: >=1.03 — AB (ref 1.000–1.030)
Total Protein, Urine: 100 — AB
Urine Glucose: NEGATIVE
Urobilinogen, UA: 1 (ref 0.0–1.0)
pH: 6 (ref 5.0–8.0)

## 2015-04-25 NOTE — Telephone Encounter (Signed)
Called pt no answer LMOM md ok UA, also sent response through mychart...Hannah Bryant

## 2015-04-25 NOTE — Telephone Encounter (Signed)
Patient sent an email through [facility] and she thinks she has another UTI and is hoping you can send in an order for lab work.

## 2015-04-25 NOTE — Telephone Encounter (Signed)
CCU for UA & C&S if having dysuria, pyuria, or hematuria

## 2015-04-27 ENCOUNTER — Other Ambulatory Visit: Payer: Self-pay | Admitting: Emergency Medicine

## 2015-04-27 ENCOUNTER — Telehealth: Payer: Self-pay | Admitting: Internal Medicine

## 2015-04-27 ENCOUNTER — Ambulatory Visit: Payer: Managed Care, Other (non HMO) | Admitting: Internal Medicine

## 2015-04-27 LAB — URINE CULTURE: Colony Count: >=100000

## 2015-04-27 MED ORDER — PHENAZOPYRIDINE HCL 200 MG PO TABS: 200.0000 mg | ORAL_TABLET | Freq: Three times a day (TID) | ORAL | Status: DC | PRN

## 2015-04-27 MED ORDER — NITROFURANTOIN MONOHYD MACRO 100 MG PO CAPS: 100.0000 mg | ORAL_CAPSULE | Freq: Two times a day (BID) | ORAL | Status: DC

## 2015-04-27 NOTE — Telephone Encounter (Signed)
Patient received a message on Mychart regarding an antibiotic is being sent to pharmacy. She called to see which pharmacy and I seen that none has been sent. She does use [facility] on [address]. Can you please give the patient a call at [phone]

## 2015-04-27 NOTE — Telephone Encounter (Signed)
Let pt know 2 Rx were sent in to pharm. Confirmed which pharm they needed to be sent to

## 2015-05-14 ENCOUNTER — Other Ambulatory Visit: Payer: Self-pay

## 2015-05-24 ENCOUNTER — Other Ambulatory Visit: Payer: Self-pay | Admitting: Family Medicine

## 2015-05-24 NOTE — Telephone Encounter (Signed)
Refill done.  

## 2015-06-19 ENCOUNTER — Telehealth: Payer: Self-pay | Admitting: Family Medicine

## 2015-06-19 NOTE — Telephone Encounter (Signed)
Spoke to pt & scheduled her for an appt 06/22/15 @ 845a.

## 2015-06-19 NOTE — Telephone Encounter (Signed)
Patient is having some problems with her achilles heel and I scheduled her for 8/12.  She is wondering if you were able to get her in anytime earlier.

## 2015-06-22 ENCOUNTER — Other Ambulatory Visit (INDEPENDENT_AMBULATORY_CARE_PROVIDER_SITE_OTHER): Payer: Managed Care, Other (non HMO)

## 2015-06-22 ENCOUNTER — Encounter: Payer: Self-pay | Admitting: Family Medicine

## 2015-06-22 ENCOUNTER — Ambulatory Visit (INDEPENDENT_AMBULATORY_CARE_PROVIDER_SITE_OTHER): Payer: Managed Care, Other (non HMO) | Admitting: Family Medicine

## 2015-06-22 VITALS — BP 134/82 | HR 62 | Ht 63.0 in | Wt 163.0 lb

## 2015-06-22 DIAGNOSIS — M7661 Achilles tendinitis, right leg: Secondary | ICD-10-CM | POA: Diagnosis not present

## 2015-06-22 DIAGNOSIS — M79671 Pain in right foot: Secondary | ICD-10-CM | POA: Diagnosis not present

## 2015-06-22 DIAGNOSIS — M6788 Other specified disorders of synovium and tendon, other site: Secondary | ICD-10-CM | POA: Insufficient documentation

## 2015-06-22 NOTE — Assessment & Plan Note (Signed)
Patient overall will try conservative therapy. Patient recommended to get a heel lift, icing regimen, topical anti-inflammatory's, and home exercises. Patient work with Product/process development scientist. We discussed the possibility of bracing which we will discuss at follow-up. Vitamin D supplementation for the calcific changes. Patient will come back and see me again in 3 weeks for further evaluation.

## 2015-06-22 NOTE — Progress Notes (Signed)
[facility] [address] [place], [place] [address] Phone: [phone] Subjective:     CC: Heel pain right  [id number] Hannah Bryant is a 69 y.o. female coming in with complaint of heel pain. This is right-sided. Patient states that it has been going on for quite some time. Does not remember any true injury. States that she has been wearing different shoes to try to avoid pressure on the area That is painful. Patient states after sitting a long amount of time he can feel tight. Denies any significant radiation of the leg. Denies any numbness or tingling or any weakness. Still able to do daily activities. Patient notices when she wears shoes with a heel it seems to feel better. Rates the severity as 5 out of 10.     Past medical history, social, surgical and family history all reviewed in electronic medical record.   Review of Systems: No headache, visual changes, nausea, vomiting, diarrhea, constipation, dizziness, abdominal pain, skin rash, fevers, chills, night sweats, weight loss, swollen lymph nodes, body aches, joint swelling, muscle aches, chest pain, shortness of breath, mood changes.   Objective Blood pressure 134/82, pulse 62, height 5\' 3"  (1.6 m), weight 163 lb (73.936 kg), last menstrual period 11/18/2000, SpO2 97 %.  General: No apparent distress alert and oriented x3 mood and affect normal, dressed appropriately.  HEENT: Pupils equal, extraocular movements intact  Respiratory: Patient's speak in full sentences and does not appear short of breath  Cardiovascular: No lower extremity edema, non tender, no erythema  Skin: Warm dry intact with no signs of infection or rash on extremities or on axial skeleton.  Abdomen: Soft nontender  Neuro: Cranial nerves II through XII are intact, neurovascularly intact in all extremities with 2+ DTRs and 2+ pulses.  Lymph: No lymphadenopathy of posterior or anterior cervical chain or axillae bilaterally.    Gait normal with good balance and coordination.  MSK:  Non tender with full range of motion and good stability and symmetric strength and tone of shoulders, elbows, wrist, hip, knee and ankles bilaterally.  Right foot exam shows the patient does have a Haglund's nodule. Patient does have full range of motion but is tender to palpation over the Haglund's nodule. Patient is neurovascularly intact distally. Negative Thompson's Contralateral foot unremarkable  MSK US performed of: Right This study was ordered, performed, and interpreted by Charlann Boxer D.O.  Foot/Ankle:   All structures visualized.   Posterior tibialis, flexor hallucis longus, and flexor digitorum longus tendons unremarkable on long and transverse views without sheath effusions. Achilles tendon does have hypoechoic changes as well as increasing Doppler flow at its insertion. Calcific changes noted. No true tear appreciated.  Anterior Talofibular Ligament and Calcaneofibular Ligaments unremarkable and intact. Deltoid Ligament unremarkable and intact. Plantar fascia intact and without effusion, normal thickness. No increased doppler signal, cap sign, or thickening of tibial cortex. Power doppler signal normal.  IMPRESSION:  Achilles tendinosis with calcific changes   Procedure note 95093; 15 minutes spent for Therapeutic exercises as stated in above notes.  This included exercises focusing on stretching, strengthening, with significant focus on eccentric aspects.  Ankle strengthening that included:  Basic range of motion exercises to allow proper full motion at ankle Stretching of the lower leg and hamstrings  Theraband exercises for the lower leg - inversion, eversion, dorsiflexion and plantarflexion each to be completed with a theraband Balance exercises to increase proprioception Weight bearing exercises to increase strength and balance Proper technique  shown and discussed handout in great detail with ATC.  All questions were  discussed and answered.     Impression and Recommendations:     This case required medical decision making of moderate complexity.

## 2015-06-22 NOTE — Patient Instructions (Signed)
Good to see you Ice can help but ice above the area.  pennsaid pinkie amount topically 2 times daily as needed.  Exercises 3 times a week.  Heel lift ([email or web address]) could help in shoe.  Shoes New balance 990 could help Vitamin D 2000 IU daily See me again in 3 weeks.

## 2015-06-22 NOTE — Progress Notes (Signed)
Pre visit review using our clinic review tool, if applicable. No additional management support is needed unless otherwise documented below in the visit note. 

## 2015-06-23 ENCOUNTER — Other Ambulatory Visit: Payer: Self-pay | Admitting: Internal Medicine

## 2015-06-25 ENCOUNTER — Other Ambulatory Visit: Payer: Self-pay | Admitting: Emergency Medicine

## 2015-06-25 MED ORDER — ESOMEPRAZOLE MAGNESIUM 40 MG PO CPDR: 40.0000 mg | DELAYED_RELEASE_CAPSULE | Freq: Every day | ORAL | Status: DC

## 2015-06-29 ENCOUNTER — Ambulatory Visit: Payer: Managed Care, Other (non HMO) | Admitting: Family Medicine

## 2015-07-02 ENCOUNTER — Other Ambulatory Visit: Payer: Self-pay | Admitting: Internal Medicine

## 2015-07-26 ENCOUNTER — Other Ambulatory Visit (INDEPENDENT_AMBULATORY_CARE_PROVIDER_SITE_OTHER): Payer: Managed Care, Other (non HMO)

## 2015-07-26 ENCOUNTER — Ambulatory Visit (INDEPENDENT_AMBULATORY_CARE_PROVIDER_SITE_OTHER): Payer: Managed Care, Other (non HMO) | Admitting: Family Medicine

## 2015-07-26 ENCOUNTER — Encounter: Payer: Self-pay | Admitting: Family Medicine

## 2015-07-26 VITALS — BP 130/86 | HR 60 | Ht 63.0 in | Wt 164.0 lb

## 2015-07-26 DIAGNOSIS — M7661 Achilles tendinitis, right leg: Secondary | ICD-10-CM | POA: Diagnosis not present

## 2015-07-26 DIAGNOSIS — M79671 Pain in right foot: Secondary | ICD-10-CM

## 2015-07-26 NOTE — Progress Notes (Signed)
  [name] [place], [place] [address] Phone: [phone] Subjective:     CC: Heel pain right follow up  [id number] AEVA POSEY is a 69 y.o. female coming in with complaint of heel pain. Patient was seen previously was diagnosed with a calcific Achilles tendinosis. Patient was to get a heel lift, home exercise, icing protocol, we discussed proper shoewear. Patient also given topical anti-inflammatory. Patient states mild improvement but nothing considerable. States that in the morning it seems to be doing really well and then unfortunately afterward this seems to be worse. Patient states that it still affecting her walking on a regular basis. Patient is concerned because she is tripping early November where she will be doing a lot of walking.    Past medical history, social, surgical and family history all reviewed in electronic medical record.   Review of Systems: No headache, visual changes, nausea, vomiting, diarrhea, constipation, dizziness, abdominal pain, skin rash, fevers, chills, night sweats, weight loss, swollen lymph nodes, body aches, joint swelling, muscle aches, chest pain, shortness of breath, mood changes.   Objective Blood pressure 130/86, pulse 60, height 5\' 3"  (1.6 m), weight 164 lb (74.39 kg), last menstrual period 11/18/2000, SpO2 97 %.  General: No apparent distress alert and oriented x3 mood and affect normal, dressed appropriately.  HEENT: Pupils equal, extraocular movements intact  Respiratory: Patient's speak in full sentences and does not appear short of breath  Cardiovascular: No lower extremity edema, non tender, no erythema  Skin: Warm dry intact with no signs of infection or rash on extremities or on axial skeleton.  Abdomen: Soft nontender  Neuro: Cranial nerves II through XII are intact, neurovascularly intact in all extremities with 2+ DTRs and 2+ pulses.  Lymph: No lymphadenopathy of posterior or  anterior cervical chain or axillae bilaterally.  Gait normal with good balance and coordination.  MSK:  Non tender with full range of motion and good stability and symmetric strength and tone of shoulders, elbows, wrist, hip, knee and ankles bilaterally.  Right foot exam shows the patient does have a Haglund's nodule. Patient does have full range of motion but is tender to palpation over the Haglund's nodule. Patient is neurovascularly intact distally. Negative Thompson's no weakness noted. No significant change from previous exam Contralateral foot unremarkable  MSK US performed of: Right This study was ordered, performed, and interpreted by Charlann Boxer D.O.  Foot/Ankle:   All structures visualized.   Posterior tibialis, flexor hallucis longus, and flexor digitorum longus tendons unremarkable on long and transverse views without sheath effusions. Achilles tendon does have significant decrease in hypoechoic changes as well as calcific changes noted but still posterior spur noted Anterior Talofibular Ligament and Calcaneofibular Ligaments unremarkable and intact. Deltoid Ligament unremarkable and intact. Plantar fascia intact and without effusion, normal thickness. No increased doppler signal, cap sign, or thickening of tibial cortex. Power doppler signal normal.  IMPRESSION:  Achilles tendinosis with decreased calcific changes    Impression and Recommendations:     This case required medical decision making of moderate complexity.

## 2015-07-26 NOTE — Assessment & Plan Note (Signed)
Patient does have morbid Achilles tendinosis and does have some mild decrease in calcific changes from previous exam. We discussed icing regimen and home exercises. Patient be put in a Cam Walker to decrease the amount of pain. We discussed continuing stay active.RTC in 2-3 weeks.

## 2015-07-26 NOTE — Patient Instructions (Signed)
Good to see you Lets put you in the boot and wear it as much possible but not to sleep and not to drive.  Ice is still your friend Continue the vitamin D  See me in 2-3 weeks and we will either get yo ustarted in PT or start our exercises again.

## 2015-07-26 NOTE — Progress Notes (Signed)
Pre visit review using our clinic review tool, if applicable. No additional management support is needed unless otherwise documented below in the visit note. 

## 2015-07-27 ENCOUNTER — Other Ambulatory Visit: Payer: Self-pay | Admitting: Internal Medicine

## 2015-07-31 ENCOUNTER — Other Ambulatory Visit: Payer: Self-pay | Admitting: Internal Medicine

## 2015-08-16 ENCOUNTER — Encounter: Payer: Self-pay | Admitting: Family Medicine

## 2015-08-16 ENCOUNTER — Ambulatory Visit (INDEPENDENT_AMBULATORY_CARE_PROVIDER_SITE_OTHER): Payer: Managed Care, Other (non HMO) | Admitting: Family Medicine

## 2015-08-16 VITALS — BP 136/80 | HR 63 | Ht 63.0 in | Wt 164.0 lb

## 2015-08-16 DIAGNOSIS — M7661 Achilles tendinitis, right leg: Secondary | ICD-10-CM | POA: Diagnosis not present

## 2015-08-16 DIAGNOSIS — Z23 Encounter for immunization: Secondary | ICD-10-CM

## 2015-08-16 NOTE — Progress Notes (Signed)
  [name] [address] [place], [place] [address] Phone: [phone] Subjective:     CC: Heel pain right follow up  [id number] Hannah Bryant is a 69 y.o. female coming in with complaint of heel pain. Patient was seen previously was diagnosed with a calcific Achilles tendinosis. Patient is been doing all conservative therapy and was making significant improvement at last follow-up. Patient was to increase certain activities. Patient states she is feeling 90% better and she's been in the [employer]. Patient states that she is no longer having any significant pain. If patient does forget to wear the Cam Walker she has some mild discomfort. Patient though overall is very happy with the results and has noticed decreasing in redness and swelling on the posterior aspect of the ankle.    Past medical history, social, surgical and family history all reviewed in electronic medical record.   Review of Systems: No headache, visual changes, nausea, vomiting, diarrhea, constipation, dizziness, abdominal pain, skin rash, fevers, chills, night sweats, weight loss, swollen lymph nodes, body aches, joint swelling, muscle aches, chest pain, shortness of breath, mood changes.   Objective Blood pressure 136/80, pulse 63, height 5\' 3"  (1.6 m), weight 164 lb (74.39 kg), last menstrual period 11/18/2000, SpO2 96 %.  General: No apparent distress alert and oriented x3 mood and affect normal, dressed appropriately.  HEENT: Pupils equal, extraocular movements intact  Respiratory: Patient's speak in full sentences and does not appear short of breath  Cardiovascular: No lower extremity edema, non tender, no erythema  Skin: Warm dry intact with no signs of infection or rash on extremities or on axial skeleton.  Abdomen: Soft nontender  Neuro: Cranial nerves II through XII are intact, neurovascularly intact in all extremities with 2+ DTRs and 2+ pulses.  Lymph: No lymphadenopathy  of posterior or anterior cervical chain or axillae bilaterally.  Gait normal with good balance and coordination.  MSK:  Non tender with full range of motion and good stability and symmetric strength and tone of shoulders, elbows, wrist, hip, knee and ankles bilaterally.  Right foot exam shows the patient does have a Haglund's nodule. Patient does have full range of motion and is no longer tender over the Haglund's nodule Patient is neurovascularly intact distally. Negative Thompson's no weakness noted. No significant change from previous exam Contralateral foot unremarkable  MSK US performed of: Right This study was ordered, performed, and interpreted by Charlann Boxer D.O.  Foot/Ankle:   All structures visualized.   Posterior tibialis, flexor hallucis longus, and flexor digitorum longus tendons unremarkable on long and transverse views without sheath effusions. Achilles tendon appears to be normal with no nodule present. Mild calcific changes still noted at insertion but significantly improved from previous exam. Anterior Talofibular Ligament and Calcaneofibular Ligaments unremarkable and intact. Deltoid Ligament unremarkable and intact. Plantar fascia intact and without effusion, normal thickness. No increased doppler signal, cap sign, or thickening of tibial cortex. Power doppler signal normal.  IMPRESSION: Nearly resolved Achilles tendinosis   Impression and Recommendations:     This case required medical decision making of moderate complexity.

## 2015-08-16 NOTE — Patient Instructions (Signed)
Good to see you Peas is your friend COme out of the boot after the weekdn when at home and short errands. Would though do the heel lift.  Ice after a lot of activity Pennsaid still if helpful After 2 weeks. (Oct 10th) go only to shoes.  See me again though before disney

## 2015-08-16 NOTE — Progress Notes (Signed)
Pre visit review using our clinic review tool, if applicable. No additional management support is needed unless otherwise documented below in the visit note. 

## 2015-08-17 ENCOUNTER — Encounter: Payer: Self-pay | Admitting: Family Medicine

## 2015-08-17 NOTE — Assessment & Plan Note (Signed)
Patient has been doing remarkably well at this time. Encourage her to using her shoe on a more regular basis but long walks continue the [employer] for another 2 weeks. We discussed continuing the icing and trying the topical anti-inflammatory on a more regular basis. Patient will continue with the vitamin supplementation. Patient and will come back and see me again in 3 weeks for further evaluation and roughly at that point we can release her.

## 2015-08-29 ENCOUNTER — Other Ambulatory Visit: Payer: Self-pay | Admitting: Internal Medicine

## 2015-09-01 ENCOUNTER — Other Ambulatory Visit: Payer: Self-pay | Admitting: Internal Medicine

## 2015-09-02 ENCOUNTER — Other Ambulatory Visit: Payer: Self-pay | Admitting: Internal Medicine

## 2015-09-03 ENCOUNTER — Other Ambulatory Visit: Payer: Self-pay | Admitting: Emergency Medicine

## 2015-09-03 MED ORDER — MONTELUKAST SODIUM 10 MG PO TABS: 10.0000 mg | ORAL_TABLET | Freq: Every day | ORAL | Status: DC | PRN

## 2015-09-03 MED ORDER — HYDROCHLOROTHIAZIDE 12.5 MG PO CAPS: 12.5000 mg | ORAL_CAPSULE | Freq: Every day | ORAL | Status: DC

## 2015-09-03 MED ORDER — ESOMEPRAZOLE MAGNESIUM 40 MG PO CPDR
40.0000 mg | DELAYED_RELEASE_CAPSULE | Freq: Every day | ORAL | Status: DC
Start: 2015-09-03 — End: 2015-10-01

## 2015-09-03 MED ORDER — ATORVASTATIN CALCIUM 40 MG PO TABS: ORAL_TABLET | ORAL | Status: DC

## 2015-09-04 ENCOUNTER — Encounter: Payer: Self-pay | Admitting: Family Medicine

## 2015-09-04 ENCOUNTER — Other Ambulatory Visit: Payer: Self-pay | Admitting: Internal Medicine

## 2015-09-04 ENCOUNTER — Ambulatory Visit (INDEPENDENT_AMBULATORY_CARE_PROVIDER_SITE_OTHER): Payer: Managed Care, Other (non HMO) | Admitting: Family Medicine

## 2015-09-04 VITALS — BP 118/74 | HR 59 | Ht 63.0 in | Wt 159.0 lb

## 2015-09-04 DIAGNOSIS — M7661 Achilles tendinitis, right leg: Secondary | ICD-10-CM

## 2015-09-04 NOTE — Assessment & Plan Note (Signed)
Patient is unremarkable and well at this time. Encourage patient to continue with the conservative therapy. Patient is going to be walking 5 days straight at [facility] and I do feel that icing prophylactically will be beneficial. Patient will take the Cam Walker in case. Patient will follow-up on an as-needed basis.

## 2015-09-04 NOTE — Patient Instructions (Signed)
You are doing great! Have a great time.  Ice after a lot of walking.  Take or wear boot on the trip  [facility]annah Bryant or [facility].  See me when you need me.

## 2015-09-04 NOTE — Progress Notes (Signed)
  [name] [address] [place], [place] [address] Phone: [phone] Subjective:     CC: Heel pain right follow up  [id number] Hannah Bryant is a 69 y.o. female coming in with complaint of heel pain. Patient was seen previously was diagnosed with a calcific Achilles tendinosis. Patient was doing significantly better can seem to have nearly resolved Achilles tendinosis. Patient was to continue the exercises in the icing. Patient was coming back before she would go on a trip where she is going to do a significant amount walking. Patient states she is feeling 95% better. Still some mild discomfort extending long amount of time or walking up and down hills. Nothing that likely what it was previously. Not wearing the boot anymore. Has been in regular shoes for greater than 2 weeks. Very happy with the results.    Past medical history, social, surgical and family history all reviewed in electronic medical record.   Review of Systems: No headache, visual changes, nausea, vomiting, diarrhea, constipation, dizziness, abdominal pain, skin rash, fevers, chills, night sweats, weight loss, swollen lymph nodes, body aches, joint swelling, muscle aches, chest pain, shortness of breath, mood changes.   Objective Blood pressure 118/74, pulse 59, height 5\' 3"  (1.6 m), weight 159 lb (72.122 kg), last menstrual period 11/18/2000, SpO2 93 %.  General: No apparent distress alert and oriented x3 mood and affect normal, dressed appropriately.  HEENT: Pupils equal, extraocular movements intact  Respiratory: Patient's speak in full sentences and does not appear short of breath  Cardiovascular: No lower extremity edema, non tender, no erythema  Skin: Warm dry intact with no signs of infection or rash on extremities or on axial skeleton.  Abdomen: Soft nontender  Neuro: Cranial nerves II through XII are intact, neurovascularly intact in all extremities with 2+ DTRs and 2+ pulses.   Lymph: No lymphadenopathy of posterior or anterior cervical chain or axillae bilaterally.  Gait normal with good balance and coordination.  MSK:  Non tender with full range of motion and good stability and symmetric strength and tone of shoulders, elbows, wrist, hip, knee and ankles bilaterally.  Right foot exam shows the patient does have a Haglund's nodule. Nontender with full range of motion of the ankle with full strength.      Impression and Recommendations:     This case required medical decision making of moderate complexity.

## 2015-09-04 NOTE — Progress Notes (Signed)
Pre visit review using our clinic review tool, if applicable. No additional management support is needed unless otherwise documented below in the visit note. 

## 2015-09-13 ENCOUNTER — Encounter: Payer: Self-pay | Admitting: Internal Medicine

## 2015-09-20 ENCOUNTER — Encounter: Payer: Self-pay | Admitting: Family Medicine

## 2015-09-21 ENCOUNTER — Telehealth: Payer: Self-pay | Admitting: Internal Medicine

## 2015-09-21 ENCOUNTER — Other Ambulatory Visit (INDEPENDENT_AMBULATORY_CARE_PROVIDER_SITE_OTHER): Payer: Managed Care, Other (non HMO) | Admitting: *Deleted

## 2015-09-21 ENCOUNTER — Ambulatory Visit (INDEPENDENT_AMBULATORY_CARE_PROVIDER_SITE_OTHER): Payer: Managed Care, Other (non HMO) | Admitting: Internal Medicine

## 2015-09-21 ENCOUNTER — Encounter: Payer: Self-pay | Admitting: Internal Medicine

## 2015-09-21 VITALS — BP 138/70 | HR 59 | Temp 97.6°F | Resp 12 | Wt 162.4 lb

## 2015-09-21 DIAGNOSIS — E11649 Type 2 diabetes mellitus with hypoglycemia without coma: Secondary | ICD-10-CM

## 2015-09-21 LAB — POCT GLYCOSYLATED HEMOGLOBIN (HGB A1C): HEMOGLOBIN A1C: 6.4

## 2015-09-21 MED ORDER — GLUCOSE BLOOD VI STRP: ORAL_STRIP | Status: DC

## 2015-09-21 NOTE — Patient Instructions (Signed)
Please continue to check sugars as needed.  Your HbA1c is 6.4!  Please come back in 4 months.

## 2015-09-21 NOTE — Telephone Encounter (Signed)
Will Dr Yong Channel be taking any transfer patients from Dr Linna Darner who is retiring

## 2015-09-21 NOTE — Progress Notes (Signed)
Patient ID: Hannah Bryant, female   DOB: 1946-02-04, 69 y.o.   MRN: [id number]  HPI: Hannah Bryant is a 69 y.o.-year-old female, returning for f/u for DM2, dx in ~2009 (or earlier), diet-controlled, with complications (reactive hypoglycemia). Previously seen by Dr. Chalmers Cater. Last 8 mo ago.   At last visit, she was describing that she had hypoglycemia every pm for "ages". Sometimes: sweating, feeling hot, nausea, sometimes vomiting.  She may need to take a nap afterward.  She started to see Dr Roney Mans ([place]) >> hypoglycemic episodes are better. He had an EGD, Ba swallow test >> she had a stricture at the site of her previous hiatal hernia with food retention in the Esophagus long after she eats (procedure: Spring 2016). She has Esophagea dilation. She also had a capsule endoscopy.   Lowest CBG now 74.   She now has a fitbit: 8500 step goal.  Last hemoglobin A1c was: Lab Results  Component Value Date   HGBA1C 6.9* 12/01/2014   HGBA1C 6.9* 12/05/2013   HGBA1C 6.9* 10/22/2012   Pt is not on meds for her DM2. She has been on Metformin >> felt poorly on it.   No thyroid disorders: Lab Results  Component Value Date   TSH 1.35 12/01/2014   No Adrenal insufficiency - a cosyntropin stimulation test was normal, as per check at last visit: Component     Latest Ref Rng 02/02/2015 02/02/2015 02/02/2015         9:21 AM 10:02 AM 10:02 AM  Cortisol, Plasma      7.1 29.1 24.2  C206 ACTH     6 - 50 pg/mL 12     Pt checks her sugars 6-8x a day and they are: - am: 99-116 >> 124-134 - 2h after b'fast: n/c - before lunch: n/c - after lunch: 150s- 130s - 90s - 60s - 50 (31 is lowest) - takes OJ repeatedly >> if not snacking:40s, if snacking: 140s - before dinner: n/c - 2h after dinner: n/c - bedtime: n/c - nighttime: n/c No lows. Lowest sugar was 40s.  she has hypoglycemia awareness at 80.  Highest sugar was 200s.  Glucometer: 2: OneTouch Verio and Freestyle Lite  Pt's meals are: -  Breakfast: oatmeal, blueberries, walnuts - mid-am snack: PB crackers x 2 - Lunch: soup or salad + chicken or cheese + ham, sometimes milk 1/2 glass (skim) - mid-pm snack: milk or peanuts or crackers - Dinner: chicken/pork + stirfry veggies or salad = brown rice. No sodas, teas.  She is seeing our nutritionist, Antonieta Iba. Last visit 02/2015.  - no CKD, last BUN/creatinine:  Lab Results  Component Value Date   BUN 14 12/01/2014   CREATININE 0.78 12/01/2014   - last set of lipids: Lab Results  Component Value Date   CHOL 161 12/01/2014   HDL 73 12/01/2014   LDLCALC 69 12/01/2014   TRIG 93 12/01/2014   CHOLHDL 3 12/05/2013  On Lipitor.  ROS: Constitutional: + weight loss , +  fatigue, + hot flushes, + nocturia Eyes: no blurry vision, no xerophthalmia ENT: no sore throat, no nodules palpated in throat, no dysphagia/odynophagia, no hoarseness Cardiovascular: no CP/SOB/palpitations/leg swelling Respiratory: no cough/SOB Gastrointestinal: no N/V/D/C/+ heartburn Musculoskeletal: no muscle/joint aches Skin: no rashes Neurological: no tremors/numbness/tingling/dizziness  I reviewed pt's medications, allergies, PMH, social hx, family hx, and changes were documented in the history of present illness. Otherwise, unchanged from my initial visit note.  History   Social History  . Marital  Status: Married    Spouse Name: N/A  . Number of Children: 2   Social History Main Topics  . Smoking status: Former Smoker    Types: Cigarettes    Quit date: 11/17/1980  . Smokeless tobacco: Never Used     Comment: smoked 1962-1982, up to 1 ppd  . Alcohol Use: 1.2 oz/week    2 Glasses of wine per week     Comment: socially,2 / week  . Drug Use: No  . Sexual Activity: Yes    Birth Control/ Protection: Post-menopausal   Social History Narrative   Daily caffeine use   Married   Veterinary surgeon intermittently   Past Medical History  Diagnosis Date  . GERD (gastroesophageal  reflux disease)   . Hyperlipidemia   . Gastroparesis     93 % retenton at 2 hrs  . Hernia     hiatal  . 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 Arthritis   . Diverticulosis   . Bursitis     tronchanteric  . hypoglycemia   . Abnormal finding on EKG     non specific T changes; short PR  . Hyperlipidemia   . Complication of anesthesia     Severe PONV  . Hiatal hernia   . Diabetes mellitus without complication ([place])     Pt states she is pre-diabetic (12-06-13)  . H/O dilation and curettage    Past Surgical History  Procedure Laterality Date  . Hiatal hernia repair  2000  . Leg surgery      Trauma LLE/pins and plates  . Colonoscopy w/ polypectomy  2006 ,2007 & 2015    negative 2009; Dr Fuller Plan  . Tubal ligation    . Cardiac catheterization  2005    < 30 % lesion  . G 3 p 3    . Myomectomy  1999    uterine fibroid  . Dilatation & curettage/hysteroscopy with trueclear N/A 01/11/2014    Procedure: DILATATION & CURETTAGE/HYSTEROSCOPY WITH TRUCLEAR;  Surgeon: Anastasio Auerbach, MD;  Location: [facility];  Service: Gynecology;  Laterality: N/A;   Current Outpatient Prescriptions on File Prior to Visit  Medication Sig Dispense Refill  . acetaminophen (TYLENOL ARTHRITIS PAIN) 650 MG CR tablet Take 650 mg by mouth. 2 by mouth 2 times daily     . aspirin 81 MG chewable tablet Chew 81 mg by mouth daily.    Marland Kitchen atorvastatin (LIPITOR) 40 MG tablet TAKE 1 TABLET BY MOUTH DAILY. --PATIENT NEEDS OFFICE VISIT BEFORE ANY FURTHER REFILLS 90 tablet 0  . Calcium-Vitamin D-Vitamin K (VIACTIV PO) Take by mouth. Take 2 by mouth once a day     . esomeprazole (NEXIUM) 40 MG capsule Take 1 capsule (40 mg total) by mouth daily. --- Needs office visit before further refills   30 capsule 2  . fexofenadine (ALLEGRA) 180 MG tablet Take 180 mg by mouth daily.      Marland Kitchen gabapentin (NEURONTIN) 100 MG capsule Take 1 capsule by mouth daily.    Marland Kitchen glucose blood (FREESTYLE LITE) test strip 1  each by Other route. Rx'ed by Dr.Balan    . hydrochlorothiazide (MICROZIDE) 12.5 MG capsule Take 1 capsule (12.5 mg total) by mouth daily. --- Needs office visit for further refills. 30 capsule 2  . hyoscyamine (LEVSIN SL) 0.125 MG SL tablet DISSOLVE ONE OR TWO TABLETS UNDER TONGUE EVERY FOUR TO SIX HOURS AS NEEDED for abdominal pain and cramping. 60 tablet 1  .  Lancets MISC by Does not apply route. Lifespan ultra 2 test strips and lancets     . meclizine (ANTIVERT) 25 MG tablet Take 1 tablet (25 mg total) by mouth 3 (three) times daily as needed for dizziness. 15 tablet 0  . montelukast (SINGULAIR) 10 MG tablet Take 1 tablet (10 mg total) by mouth daily as needed. --- Needs office visit before further refills 30 tablet 2  . nitrofurantoin, macrocrystal-monohydrate, (MACROBID) 100 MG capsule Take 1 capsule (100 mg total) by mouth 2 (two) times daily. 20 capsule 0  . OVER THE COUNTER MEDICATION multistrain probiotic    . PENNSAID 2 % SOLN APPLY TWICE DAILY 112 g 1  . phenazopyridine (PYRIDIUM) 200 MG tablet Take 1 tablet (200 mg total) by mouth 3 (three) times daily as needed for pain. 15 tablet 0   Current Facility-Administered Medications on File Prior to Visit  Medication Dose Route Frequency Provider Last Rate Last Dose  . lidocaine (XYLOCAINE) 1 % (with pres) injection 10 mL  10 mL Infiltration Once Anastasio Auerbach, MD       Allergies  Allergen Reactions  . Domperidone     Stomach cramps  . Fentanyl Nausea And Vomiting  . Monistat [Miconazole Nitrate]     SEVERE BURNING  . Nsaids     REACTION: stomach complaints  . Oxycodone Hcl     REACTION: VOMITTING  . Codeine     nausea  . Erythromycin Ethylsuccinate     gastritis   Family History  Problem Relation Age of Onset  . Ulcers Father   . Diabetes Father   . Hypertension Father   . Heart attack Father     > 81  . Stroke Father     in 49s  . Heart attack Mother 53  . Diabetes Sister   . Heart attack Sister 2    CABG X 4   . Cancer Neg Hx   . Colitis Neg Hx    PE: BP 138/70 mmHg  Pulse 59  Temp(Src) 97.6 F (36.4 C) (Oral)  Resp 12  Wt 162 lb 6.4 oz (73.664 kg)  SpO2 94%  LMP 11/18/2000 Wt Readings from Last 3 Encounters:  09/21/15 162 lb 6.4 oz (73.664 kg)  09/04/15 159 lb (72.122 kg)  08/16/15 164 lb (74.39 kg)   Constitutional: overweight, in NAD Eyes: PERRLA, EOMI, no exophthalmos ENT: moist mucous membranes, no thyromegaly, no cervical lymphadenopathy Cardiovascular: RRR, No MRG Respiratory: CTA B Gastrointestinal: abdomen soft, NT, ND, BS+ Musculoskeletal: no deformities, strength intact in all 4 Skin: moist, warm, no rashes Neurological: no tremor with outstretched hands, DTR normal in all 4  ASSESSMENT: 1. Diet-controlled DM2, without complications.  2. Hypoglycemia - possibly reactive -improved - we r/o Adrenal insufficiency at last visit: Component     Latest Ref Rng 02/02/2015 02/02/2015 02/02/2015         9:21 AM 10:02 AM 10:02 AM  Cortisol, Plasma      7.1 29.1 24.2  C206 ACTH     6 - 50 pg/mL 12     PLAN:  1. DM2, controlled - not on meds - well controlled, except for hypoglycemia episodes, which have improved after her Es stretching - she is more active - loves her fitbit - last 3 HbA1c levels 6.9% >> today: 6.4% (better!)  2. Patient with history of reactive (postprandial) hypoglycemia, with pm low CBG levels. She still eats snack to avoid drops, but she describes a drop at being in the 70s.  I reassured her that this is a normal CBG. She feels she would drop her sugars to 40s if she does not keep eating in the pm, but this is much better after he Es procedure, she thinks.

## 2015-09-21 NOTE — Telephone Encounter (Signed)
Spoke with pt told her not at this time will he be able to except her.

## 2015-09-21 NOTE — Telephone Encounter (Signed)
i would be willing to once all of Dr. Jenkins/swords/campbell/webb patients have been seen. I think for now need to make sure everyone has been reestablished though

## 2015-09-21 NOTE — Telephone Encounter (Signed)
See below

## 2015-09-27 ENCOUNTER — Other Ambulatory Visit: Payer: Self-pay | Admitting: Internal Medicine

## 2015-10-01 ENCOUNTER — Other Ambulatory Visit (INDEPENDENT_AMBULATORY_CARE_PROVIDER_SITE_OTHER): Payer: Managed Care, Other (non HMO)

## 2015-10-01 ENCOUNTER — Encounter: Payer: Self-pay | Admitting: Internal Medicine

## 2015-10-01 ENCOUNTER — Ambulatory Visit (INDEPENDENT_AMBULATORY_CARE_PROVIDER_SITE_OTHER)
Admission: RE | Admit: 2015-10-01 | Discharge: 2015-10-01 | Disposition: A | Discharge status: Home / Self Care | Payer: Managed Care, Other (non HMO) | Source: Ambulatory Visit | Attending: Internal Medicine | Admitting: Internal Medicine

## 2015-10-01 ENCOUNTER — Ambulatory Visit (INDEPENDENT_AMBULATORY_CARE_PROVIDER_SITE_OTHER): Payer: Managed Care, Other (non HMO) | Admitting: Internal Medicine

## 2015-10-01 VITALS — BP 118/72 | HR 61 | Temp 98.1°F | Resp 20 | Wt 159.2 lb

## 2015-10-01 DIAGNOSIS — I10 Essential (primary) hypertension: Secondary | ICD-10-CM | POA: Diagnosis not present

## 2015-10-01 DIAGNOSIS — K2100 Gastro-esophageal reflux disease with esophagitis, without bleeding: Secondary | ICD-10-CM

## 2015-10-01 DIAGNOSIS — M542 Cervicalgia: Secondary | ICD-10-CM

## 2015-10-01 DIAGNOSIS — E785 Hyperlipidemia, unspecified: Secondary | ICD-10-CM | POA: Diagnosis not present

## 2015-10-01 LAB — BASIC METABOLIC PANEL
BUN: 12 mg/dL (ref 6–23)
CALCIUM: 9.8 mg/dL (ref 8.4–10.5)
CHLORIDE: 99 meq/L (ref 96–112)
CO2: 39 mEq/L — ABNORMAL HIGH (ref 19–32)
Creatinine, Ser: 0.74 mg/dL (ref 0.40–1.20)
GFR: 82.26 mL/min (ref >60.00)
Glucose, Bld: 129 mg/dL — ABNORMAL HIGH (ref 70–99)
Potassium: 3.1 mEq/L — ABNORMAL LOW (ref 3.5–5.1)
Sodium: 146 mEq/L — ABNORMAL HIGH (ref 135–145)

## 2015-10-01 MED ORDER — MONTELUKAST SODIUM 10 MG PO TABS: ORAL_TABLET | ORAL | Status: DC

## 2015-10-01 MED ORDER — ESOMEPRAZOLE MAGNESIUM 40 MG PO CPDR: 40.0000 mg | DELAYED_RELEASE_CAPSULE | Freq: Every day | ORAL | Status: AC

## 2015-10-01 MED ORDER — ATORVASTATIN CALCIUM 40 MG PO TABS: ORAL_TABLET | ORAL | Status: DC

## 2015-10-01 MED ORDER — HYDROCHLOROTHIAZIDE 12.5 MG PO CAPS: 12.5000 mg | ORAL_CAPSULE | Freq: Every day | ORAL | Status: DC

## 2015-10-01 NOTE — Progress Notes (Signed)
Pre visit review using our clinic review tool, if applicable. No additional management support is needed unless otherwise documented below in the visit note. 

## 2015-10-01 NOTE — Assessment & Plan Note (Signed)
Renew Nexium

## 2015-10-01 NOTE — Progress Notes (Signed)
   Subjective:    Patient ID: Hannah Bryant, female    DOB: 1946-03-19, 69 y.o.   MRN: [id number]  HPI The patient is here to assess status of active health conditions.  She is on a heart healthy ,low-salt diet. She tries to walk 10,000 steps at least 5 days a week. She also works out with a Clinical research associate at [employer] twice a week. She has no associated cardiopulmonary symptoms.  She's been compliant with her medicines without adverse effects. She is not monitoring blood pressure.  She has seen her Endocrinologist; her A1c is now 6.4%.  She also was seen at [facility] by Dr Roney Mans ;he performed upper endoscopy and esophageal dilation; esophagram; and manometry this past Spring. She did have decreased peristalsis on the manometry.  Her major issue at this time is pain and stiffness in the right neck. There was no trigger or injury other than possibly sleeping with her neck in an abnormal position. It has responded to  Tylenol but not massage. She has no associated neuromuscular symptoms such as numbness, tingling, weakness in the right upper extremity. There is no gait issue or stool or urine incontinence.   Review of Systems  There is no significant cough, sputum production,hemoptysis, wheezing,or  paroxysmal nocturnal dyspnea. Unexplained weight loss, significant dyspepsia, dysphagia, melena, rectal bleeding, or persistently small caliber stools are not present. Dysuria, pyuria, hematuria, frequency, nocturia or polyuria are denied.     Objective:   Physical Exam   Pertinent or positive findings include:The nasal septum is deviated to the left. There is increased curvature of the upper thoracic spine.   General appearance :adequately nourished; in no distress.  Eyes: No conjunctival inflammation or scleral icterus is present.  Oral exam:  Lips and gums are healthy appearing.There is no oropharyngeal erythema or exudate noted. Dental hygiene is good.  Heart:  Normal rate and regular rhythm. S1  and S2 normal without gallop, murmur, click, rub or other extra sounds    Lungs:Chest clear to auscultation; no wheezes, rhonchi,rales ,or rubs present.No increased work of breathing.   Abdomen: bowel sounds normal, soft and non-tender without masses, organomegaly or hernias noted.  No guarding or rebound. No flank tenderness to percussion.  Vascular : all pulses equal ; no bruits present.  Skin:Warm & dry.  Intact without suspicious lesions or rashes ; no tenting or jaundice   Lymphatic: No lymphadenopathy is noted about the head, neck, axilla.   Neuro: Strength, tone & DTRs normal.Strength normal to opposition.     Assessment & Plan:  See Current Assessment & Plan in Problem List under specific Diagnosi  Medications will be refilled for 3 months until she can establish with a new primary care physician.

## 2015-10-01 NOTE — Assessment & Plan Note (Signed)
Refill meds x 3 mos until CPX wiyth new MD

## 2015-10-01 NOTE — Patient Instructions (Addendum)
Use a cervical memory foam pillow to prevent hyperextension or hyperflexion of the cervical spine.  Use an anti-inflammatory cream such as Aspercreme or Zostrix cream twice a day to the affected area as needed. In lieu of this warm moist compresses or  hot water bottle can be used. Do not apply ice . 

## 2015-10-29 ENCOUNTER — Other Ambulatory Visit: Payer: Self-pay | Admitting: Internal Medicine

## 2015-10-29 ENCOUNTER — Encounter: Payer: Self-pay | Admitting: Internal Medicine

## 2015-10-29 DIAGNOSIS — E876 Hypokalemia: Secondary | ICD-10-CM

## 2015-11-05 ENCOUNTER — Other Ambulatory Visit (INDEPENDENT_AMBULATORY_CARE_PROVIDER_SITE_OTHER): Payer: Managed Care, Other (non HMO)

## 2015-11-05 DIAGNOSIS — E876 Hypokalemia: Secondary | ICD-10-CM

## 2015-11-05 LAB — BASIC METABOLIC PANEL
BUN: 17 mg/dL (ref 6–23)
CALCIUM: 9.5 mg/dL (ref 8.4–10.5)
CO2: 33 meq/L — AB (ref 19–32)
CREATININE: 0.82 mg/dL (ref 0.40–1.20)
Chloride: 101 mEq/L (ref 96–112)
GFR: 73.05 mL/min (ref >60.00)
GLUCOSE: 120 mg/dL — AB (ref 70–99)
Potassium: 3.7 mEq/L (ref 3.5–5.1)
Sodium: 141 mEq/L (ref 135–145)

## 2016-01-09 ENCOUNTER — Ambulatory Visit (INDEPENDENT_AMBULATORY_CARE_PROVIDER_SITE_OTHER): Payer: Managed Care, Other (non HMO) | Admitting: Internal Medicine

## 2016-01-09 ENCOUNTER — Other Ambulatory Visit: Payer: Managed Care, Other (non HMO)

## 2016-01-09 ENCOUNTER — Encounter: Payer: Self-pay | Admitting: Internal Medicine

## 2016-01-09 VITALS — BP 150/88 | HR 61 | Temp 98.4°F | Resp 16 | Wt 161.0 lb

## 2016-01-09 DIAGNOSIS — E785 Hyperlipidemia, unspecified: Secondary | ICD-10-CM

## 2016-01-09 DIAGNOSIS — I10 Essential (primary) hypertension: Secondary | ICD-10-CM | POA: Diagnosis not present

## 2016-01-09 DIAGNOSIS — K2100 Gastro-esophageal reflux disease with esophagitis, without bleeding: Secondary | ICD-10-CM

## 2016-01-09 DIAGNOSIS — Z23 Encounter for immunization: Secondary | ICD-10-CM

## 2016-01-09 DIAGNOSIS — R109 Unspecified abdominal pain: Secondary | ICD-10-CM

## 2016-01-09 DIAGNOSIS — M810 Age-related osteoporosis without current pathological fracture: Secondary | ICD-10-CM | POA: Insufficient documentation

## 2016-01-09 DIAGNOSIS — E11649 Type 2 diabetes mellitus with hypoglycemia without coma: Secondary | ICD-10-CM | POA: Diagnosis not present

## 2016-01-09 DIAGNOSIS — Z139 Encounter for screening, unspecified: Secondary | ICD-10-CM

## 2016-01-09 DIAGNOSIS — I152 Hypertension secondary to endocrine disorders: Secondary | ICD-10-CM | POA: Insufficient documentation

## 2016-01-09 MED ORDER — HYDROCHLOROTHIAZIDE 12.5 MG PO CAPS: 12.5000 mg | ORAL_CAPSULE | Freq: Every day | ORAL | Status: DC

## 2016-01-09 MED ORDER — ATORVASTATIN CALCIUM 40 MG PO TABS: ORAL_TABLET | ORAL | Status: DC

## 2016-01-09 MED ORDER — MONTELUKAST SODIUM 10 MG PO TABS: ORAL_TABLET | ORAL | Status: DC

## 2016-01-09 MED ORDER — HYOSCYAMINE SULFATE 0.125 MG SL SUBL: SUBLINGUAL_TABLET | SUBLINGUAL | Status: DC

## 2016-01-09 NOTE — Progress Notes (Signed)
Pre visit review using our clinic review tool, if applicable. No additional management support is needed unless otherwise documented below in the visit note. 

## 2016-01-09 NOTE — Assessment & Plan Note (Signed)
Lower abdominal cramping associated with diarrhea, tends to come with her hypoglycemia Hyoscyamine helps Colonoscopy normal

## 2016-01-09 NOTE — Patient Instructions (Addendum)
Make sure you are taking calcium citrate not calcium carbonate because it will be absorbed with the you being on the nexium.    All other Health Maintenance issues reviewed.   All recommended immunizations and age-appropriate screenings are up-to-date.  prevnar vaccine administered today.   Medications reviewed and updated.  No changes recommended at this time.  Your prescription(s) have been submitted to your pharmacy. Please take as directed and contact our office if you believe you are having problem(s) with the medication(s).   Please followup for your annual physical

## 2016-01-09 NOTE — Assessment & Plan Note (Signed)
Taking nexium daily Has very tight LES due to hiatal hernia surgery - s/p dilation x 2 Follows with GI Continue nexium Small, frequent meals

## 2016-01-09 NOTE — Progress Notes (Signed)
Subjective:    Patient ID: Hannah Bryant, female    DOB: 05/18/1946, 70 y.o.   MRN: [id number]  HPI She is here to establish with a new pcp.   She is here for follow up.  Diabetes: She is not on any medication and follows with Dr. Cruzita Lederer. She gets hypoglycemia in the afternoons due to her esophageal abnormality.  She is compliant with a diabetic diet. She is exercising regularly. She monitors her sugars and they have been running 120 in morning, she checks it frequently in the afternoon. She checks her feet daily and denies foot lesions. She is up-to-date with an ophthalmology examination.   Hyperlipidemia: She is taking her medication daily. She is compliant with a low fat/cholesterol diet. She is exercising regularly. She denies myalgias.   GERD, tight LES:  She had hiatal hernia surgery several years ago.  She has a very tight LES that is not thought to be related to achalasia and more related to surgery.  She has had it dilated twice.  She is taking her GERD medication daily as prescribed.  She denies any typical  GERD symptoms, but feels a sensation in her chest - mid esophagus - at night.  She will drink baking soda and water at night.    Hypertension: She is taking her medication daily. She is compliant with a low sodium diet.  She denies chest pain, palpitations, edema, shortness of breath and regular headaches. She is exercising regularly.      Medications and allergies reviewed with patient and updated if appropriate.  Patient Active Problem List   Diagnosis Date Noted  . Achilles tendinosis 06/22/2015  . Junctional nevus of left thigh 12/23/2014  . Recurrent abdominal pain 11/28/2014  . Left rotator cuff tear arthropathy 08/21/2014  . Squamous cell skin cancer 10/06/2013  . Type 2 diabetes mellitus with hypoglycemia ([place]) 07/02/2011  . HYPOGLYCEMIA, REACTIVE 02/04/2011  . BENIGN POSITIONAL VERTIGO 08/27/2009  . NONSPECIFIC ABNORM RESULTS THYROID FUNCT STUDY 07/13/2009    . History of colonic polyps 05/09/2008  . Gastroparesis 11/15/2007  . DIVERTICULOSIS, COLON 11/15/2007  . POSTMENOPAUSAL STATUS 08/19/2007  . GASTROESOPHAGEAL REFLUX DISEASE, SEVERE 05/12/2007  . Dysphagia 05/12/2007  . Nonspecific abnormal electrocardiogram (ECG) (EKG) 05/12/2007  . Hyperlipidemia 05/07/2007    Current Outpatient Prescriptions on File Prior to Visit  Medication Sig Dispense Refill  . acetaminophen (TYLENOL ARTHRITIS PAIN) 650 MG CR tablet Take 650 mg by mouth. 2 by mouth 2 times daily     . aspirin 81 MG chewable tablet Chew 81 mg by mouth daily.    Marland Kitchen atorvastatin (LIPITOR) 40 MG tablet TAKE 1 TABLET BY MOUTH DAILY 90 tablet 0  . Calcium-Vitamin D-Vitamin K (VIACTIV PO) Take by mouth. Take 2 by mouth once a day     . esomeprazole (NEXIUM) 40 MG capsule Take 1 capsule (40 mg total) by mouth daily. --- Needs office visit before further refills   30 capsule 2  . fexofenadine (ALLEGRA) 180 MG tablet Take 180 mg by mouth daily.      Marland Kitchen glucose blood test strip Use 7-10 x a day - Verio 400 each 3  . hydrochlorothiazide (MICROZIDE) 12.5 MG capsule Take 1 capsule (12.5 mg total) by mouth daily. 1 qd 90 capsule 0  . hyoscyamine (LEVSIN SL) 0.125 MG SL tablet DISSOLVE ONE OR TWO TABLETS UNDER TONGUE EVERY FOUR TO SIX HOURS AS NEEDED for abdominal pain and cramping. 60 tablet 1  . Lancets MISC by Does  not apply route. Lifespan ultra 2 test strips and lancets     . montelukast (SINGULAIR) 10 MG tablet 1 qd prn 90 tablet 0  . OVER THE COUNTER MEDICATION multistrain probiotic    . PENNSAID 2 % SOLN APPLY TWICE DAILY 112 g 1   Current Facility-Administered Medications on File Prior to Visit  Medication Dose Route Frequency Provider Last Rate Last Dose  . lidocaine (XYLOCAINE) 1 % (with pres) injection 10 mL  10 mL Infiltration Once Anastasio Auerbach, MD        Past Medical History  Diagnosis Date  . GERD (gastroesophageal reflux disease)   . Hyperlipidemia   . Gastroparesis      93 % retenton at 2 hrs  . Hernia     hiatal  . 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 Arthritis   . Diverticulosis   . Bursitis     tronchanteric  . hypoglycemia   . Abnormal finding on EKG     non specific T changes; short PR  . Hyperlipidemia   . Complication of anesthesia     Severe PONV  . Hiatal hernia   . Diabetes mellitus without complication ([place])     Pt states she is pre-diabetic (12-06-13)  . H/O dilation and curettage     Past Surgical History  Procedure Laterality Date  . Hiatal hernia repair  2000  . Leg surgery      Trauma LLE/pins and plates  . Colonoscopy w/ polypectomy  2006 ,2007 & 2015    negative 2009; Dr Fuller Plan  . Tubal ligation    . Cardiac catheterization  2005    < 30 % lesion  . G 3 p 3    . Myomectomy  1999    uterine fibroid  . Dilatation & curettage/hysteroscopy with trueclear N/A 01/11/2014    Procedure: DILATATION & CURETTAGE/HYSTEROSCOPY WITH TRUCLEAR;  Surgeon: Anastasio Auerbach, MD;  Location: [facility];  Service: Gynecology;  Laterality: N/A;  . Upper endoscopy and esophageal dilation  02/20/15    Dr Roney Mans , [facility]  . Esophagram  03/29/15    Persistent narrowing at site of hiatal hernia; proximal dilation  . Esophageal manometry  03/22/15    Decreased peristalsis    Social History   Social History  . Marital Status: Married    Spouse Name: N/A  . Number of Children: 2  . Years of Education: N/A   Occupational History  .     Social History Main Topics  . Smoking status: Former Smoker    Types: Cigarettes    Quit date: 11/17/1980  . Smokeless tobacco: Never Used     Comment: smoked 1962-1982, up to 1 ppd  . Alcohol Use: 1.2 oz/week    2 Glasses of wine per week     Comment: socially,2 / week  . Drug Use: No  . Sexual Activity: Yes    Birth Control/ Protection: Post-menopausal   Other Topics Concern  . Not on file   Social History Narrative   Daily caffeine use   Married   Education officer, community intermittently    Family History  Problem Relation Age of Onset  . Ulcers Father   . Diabetes Father   . Hypertension Father   . Heart attack Father     > 30  . Stroke Father     in 56s  . Heart attack Mother 11  . Diabetes Sister   . Heart  attack Sister 92    CABG X 4  . Cancer Neg Hx   . Colitis Neg Hx     Review of Systems  Constitutional: Negative for fever.  HENT: Positive for trouble swallowing (rare).   Respiratory: Negative for cough, choking, shortness of breath and wheezing.   Cardiovascular: Positive for chest pain (only esophageal related). Negative for palpitations and leg swelling.  Gastrointestinal: Positive for abdominal pain and diarrhea.       GERD on occasion  Neurological: Negative for dizziness, light-headedness and headaches.       Objective:   Filed Vitals:   01/09/16 0834  BP: 150/88  Pulse: 61  Temp: 98.4 F (36.9 C)  Resp: 16   Filed Weights   01/09/16 0834  Weight: 161 lb (73.029 kg)   Body mass index is 28.53 kg/(m^2).   Physical Exam Constitutional: Appears well-developed and well-nourished. No distress.  Neck: Neck supple. No tracheal deviation present. No thyromegaly present.  No carotid bruit. No cervical adenopathy.   Cardiovascular: Normal rate, regular rhythm and normal heart sounds.   2/6 systolic murmur.  No edema Pulmonary/Chest: Effort normal and breath sounds normal. No respiratory distress. No wheezes. Psych; normal mood and affect       Assessment & Plan:   See Problem List for Assessment and Plan of chronic medical problems.   Follow up annually

## 2016-01-09 NOTE — Assessment & Plan Note (Signed)
BP elevated here today Typically well controlled No change in medication

## 2016-01-09 NOTE — Assessment & Plan Note (Addendum)
Follows with Dr Cruzita Lederer Diet controlled Hypoglycemia in afternoon related to esophageal disorder

## 2016-01-18 ENCOUNTER — Ambulatory Visit: Payer: Managed Care, Other (non HMO) | Admitting: Internal Medicine

## 2016-02-12 ENCOUNTER — Encounter: Payer: Self-pay | Admitting: Family Medicine

## 2016-02-13 LAB — HM MAMMOGRAPHY

## 2016-02-15 ENCOUNTER — Encounter: Payer: Self-pay | Admitting: Internal Medicine

## 2016-02-26 ENCOUNTER — Ambulatory Visit (INDEPENDENT_AMBULATORY_CARE_PROVIDER_SITE_OTHER): Payer: Managed Care, Other (non HMO) | Admitting: Family Medicine

## 2016-02-26 ENCOUNTER — Encounter: Payer: Self-pay | Admitting: Family Medicine

## 2016-02-26 ENCOUNTER — Other Ambulatory Visit (INDEPENDENT_AMBULATORY_CARE_PROVIDER_SITE_OTHER): Payer: Managed Care, Other (non HMO)

## 2016-02-26 VITALS — BP 124/80 | HR 76

## 2016-02-26 DIAGNOSIS — M7750 Other enthesopathy of unspecified foot: Secondary | ICD-10-CM | POA: Insufficient documentation

## 2016-02-26 DIAGNOSIS — M7751 Other enthesopathy of right foot: Secondary | ICD-10-CM

## 2016-02-26 DIAGNOSIS — M7661 Achilles tendinitis, right leg: Secondary | ICD-10-CM

## 2016-02-26 NOTE — Assessment & Plan Note (Signed)
Patient given injection today and tolerated the procedure very well. We discussed wearing the boot again for another 2 weeks. Patient has topical anti-inflammatory's. We discussed icing regimen. Discussed which activities to avoid. Follow up again in 2-3 weeks to make sure that we are improving. If worsening symptoms formal physical therapy could be beneficial.

## 2016-02-26 NOTE — Progress Notes (Signed)
[facility] [address], [place] [address] Phone: [phone] Subjective:     CC: Heel pain right follow up  [id number] Hannah Bryant is a 70 y.o. female coming in with complaint of heel pain. Patient was seen previously was diagnosed with a calcific Achilles tendinosis. Patient was doing significantly better can seem to have nearly resolved Achilles tendinosis.in the boot last 2 weeks since worsening pain.  Better since being in the boot. Patient unfortunately continues to have adull ,throbbing aching pain ontheposterioraspectoftheheel.Patient[place]achin[place]anddoeshave a trip planned to go to [place] n September  Past medical history, social, surgical and family history all reviewed in electronic medical record.   Review of Systems: No headache, visual changes, nausea, vomiting, diarrhea, constipation, dizziness, abdominal pain, skin rash, fevers, chills, night sweats, weight loss, swollen lymph nodes, body aches, joint swelling, muscle aches, chest pain, shortness of breath, mood changes.   Objective Blood pressure 124/80, pulse 76, last menstrual period 11/18/2000, SpO2 97 %.  General: No apparent distress alert and oriented x3 mood and affect normal, dressed appropriately.  HEENT: Pupils equal, extraocular movements intact  Respiratory: Patient's speak in full sentences and does not appear short of breath  Cardiovascular: No lower extremity edema, non tender, no erythema  Skin: Warm dry intact with no signs of infection or rash on extremities or on axial skeleton.  Abdomen: Soft nontender  Neuro: Cranial nerves II through XII are intact, neurovascularly intact in all extremities with 2+ DTRs and 2+ pulses.  Lymph: No lymphadenopathy of posterior or anterior cervical chain or axillae bilaterally.  Gait normal with good balance and coordination.  MSK:  Non tender with full range of motion and good stability and symmetric strength and  tone of shoulders, elbows, wrist, hip, knee and ankles bilaterally.  Right foot exam shows the patient does have a Haglund's nodule. Increasing tenderness over the retrocalcaneal area  MSK US performed [id number] ankle This study was ordered, performed, and interpreted by Charlann Boxer D.O.  Foot/Ankle:   All structures visualized.   Talar dome unremarkable  Ankle mortise without effusion. Peroneus longus and brevis tendons unremarkable on long and transverse views without sheath effusions. Posterior tibialis, flexor hallucis longus, and flexor digitorum longus tendons unremarkable on long and transverse views without sheath effusions. Achilles tendon visualizedbut no significant tear patient. No significant hypoechoic changes or increasing Doppler flow. Patient though does have a fairly large retrocalcaneal bursa that is inflamed with increasing upper flow. Anterior Talofibular Ligament and Calcaneofibular Ligaments unremarkable and intact. Deltoid Ligament unremarkable and intact. Plantar fascia intact and without effusion, normal thickness. No increased doppler signal, cap sign, or thickening of tibial cortex.  IMPRESSION:  Retrocalcaneal bursitis   Procedure: Real-time Ultrasound Guided Injection of right retrocalcaneal bursa Device: GE Logiq E  Ultrasound guided injection is preferred based studies that show increased duration, increased effect, greater accuracy, decreased procedural pain, increased response rate, and decreased cost with ultrasound guided versus blind injection.  Verbal informed consent obtained.  Time-out conducted.  Noted no overlying erythema, induration, or other signs of local infection.  Skin prepped in a sterile fashion.  Local anesthesia: Topical Ethyl chloride.  With sterile technique and under real time ultrasound guidance:  With a 25-gauge half-inch needle patient was injected with a total of 0.5 mL of 0.5% Marcaine and 0.5 mL of Kenalog 40 g/dL Completed  without difficulty  Pain immediately resolved suggesting accurate placement of the medication.  Advised to call if fevers/chills, erythema, induration, drainage,  or persistent bleeding.  Images permanently stored and available for review in the ultrasound unit.  Impression: Technically successful ultrasound guided injection.    Impression and Recommendations:     This case required medical decision making of moderate complexity.

## 2016-02-26 NOTE — Patient Instructions (Signed)
Good to see you  Ice 20 minutes 2 times daily. Usually after activity and before bed. Keep wearing the boot for another 2 weeks to be safe.  Then go to a shoe with a heel lift for another 2 weeks and increase activity slowly  Add tart cherry extract at night any dose See me again in 3-4 weeks and we will make sure you are doing great and readay for the beach  If not better then we will need to consider physical therapy

## 2016-02-26 NOTE — Progress Notes (Signed)
Pre visit review using our clinic review tool, if applicable. No additional management support is needed unless otherwise documented below in the visit note. 

## 2016-02-28 ENCOUNTER — Ambulatory Visit (INDEPENDENT_AMBULATORY_CARE_PROVIDER_SITE_OTHER): Payer: Managed Care, Other (non HMO) | Admitting: Internal Medicine

## 2016-02-28 ENCOUNTER — Encounter: Payer: Self-pay | Admitting: Internal Medicine

## 2016-02-28 VITALS — BP 132/78 | HR 71 | Temp 98.2°F | Wt 163.0 lb

## 2016-02-28 DIAGNOSIS — E162 Hypoglycemia, unspecified: Secondary | ICD-10-CM

## 2016-02-28 DIAGNOSIS — E11649 Type 2 diabetes mellitus with hypoglycemia without coma: Secondary | ICD-10-CM | POA: Diagnosis not present

## 2016-02-28 DIAGNOSIS — E161 Other hypoglycemia: Secondary | ICD-10-CM

## 2016-02-28 LAB — HEPATIC FUNCTION PANEL
ALBUMIN: 4.4 g/dL (ref 3.5–5.2)
ALK PHOS: 54 U/L (ref 39–117)
ALT: 23 U/L (ref 0–35)
AST: 23 U/L (ref 0–37)
Bilirubin, Direct: 0.1 mg/dL (ref 0.0–0.3)
TOTAL PROTEIN: 7 g/dL (ref 6.0–8.3)
Total Bilirubin: 0.7 mg/dL (ref 0.2–1.2)

## 2016-02-28 LAB — LIPID PANEL
CHOL/HDL RATIO: 3
Cholesterol: 159 mg/dL (ref 0–200)
HDL: 58 mg/dL (ref >39.00)
LDL CALC: 71 mg/dL (ref 0–99)
NONHDL: 101.38
TRIGLYCERIDES: 154 mg/dL — AB (ref 0.0–149.0)
VLDL: 30.8 mg/dL (ref 0.0–40.0)

## 2016-02-28 LAB — HEMOGLOBIN A1C: HEMOGLOBIN A1C: 6.7 % — AB (ref 4.6–6.5)

## 2016-02-28 MED ORDER — GLUCOSE BLOOD VI STRP: ORAL_STRIP | Status: DC

## 2016-02-28 NOTE — Progress Notes (Signed)
Patient ID: Hannah Bryant, female   DOB: 08-06-1946, 70 y.o.   MRN: [id number]  HPI: Hannah Bryant is a 70 y.o.-year-old female, returning for f/u for DM2, dx in ~2009 (or earlier), diet-controlled, with complications (reactive hypoglycemia). Previously seen by Dr. Chalmers Cater. Last 5 mo ago.   Since last visit, she had Achilles tendon tendonitis >> boot and steroid inj.  Reactive hypoglycemia:  At last visit, she was describing that she had hypoglycemia every pm for "ages". Sometimes: sweating, feeling hot, nausea, sometimes vomiting.  She may need to take a nap afterward.  She started to see Dr Roney Mans ([facility]) >> hypoglycemic episodes = better. She had an EGD, Ba swallow test >> she had a stricture at the site of her previous hiatal hernia with food retention in the Esophagus long after she eats (procedure: Spring 2016). She has Esophagea dilation. She also had a capsule endoscopy.   Lowest CBG now 68 - but usually eats a glucose gel - 15g carbs or OJ)  She now has a fitbit: 9500 step goal.  DM2:  Last hemoglobin A1c was: Lab Results  Component Value Date   HGBA1C 6.4 09/21/2015   HGBA1C 6.9* 12/01/2014   HGBA1C 6.9* 12/05/2013   Pt is not on meds for her DM2. She has been on Metformin >> felt poorly on it.   Pt checks her sugars 6x a day and they are: - am: 99-116 >> 124-134 >> 130-145 - 2h after b'fast: n/c - before lunch: n/c - after lunch:  if not snacking:40s, if snacking: 140s >> 180-200 then 68-100 later in the pm - before dinner: n/c - 2h after dinner: n/c - bedtime: n/c - nighttime: n/c No lows. Lowest sugar was 40s >> 68.  she has hypoglycemia awareness at 80.  Highest sugar was 200s.  Glucometer: 2: OneTouch Verio and Freestyle Lite  Pt's meals are: - Breakfast: oatmeal, blueberries, walnuts - mid-am snack: PB crackers x 2 - Lunch: soup or salad + chicken or cheese + ham, sometimes milk 1/2 glass (skim) - mid-pm snack: milk or peanuts or crackers -  Dinner: chicken/pork + stirfry veggies or salad = brown rice. No sodas, teas.  She is seeing our nutritionist, Antonieta Iba. Last visit 02/2015.  - no CKD, last BUN/creatinine:  Lab Results  Component Value Date   BUN 17 11/05/2015   CREATININE 0.82 11/05/2015   - last set of lipids: Lab Results  Component Value Date   CHOL 161 12/01/2014   HDL 73 12/01/2014   LDLCALC 69 12/01/2014   TRIG 93 12/01/2014   CHOLHDL 3 12/05/2013  On Lipitor.  ROS: Constitutional: no weight loss or gain, +  fatigue, no nocturia Eyes: no blurry vision, no xerophthalmia ENT: no sore throat, no nodules palpated in throat, no dysphagia/odynophagia, no hoarseness Cardiovascular: no CP/SOB/palpitations/leg swelling Respiratory: no cough/SOB Gastrointestinal: no N/V/+ D/no C/+ heartburn Musculoskeletal: no muscle/+ joint aches Skin: no rashes Neurological: no tremors/numbness/tingling/dizziness  I reviewed pt's medications, allergies, PMH, social hx, family hx, and changes were documented in the history of present illness. Otherwise, unchanged from my initial visit note.  History   Social History  . Marital Status: Married    Spouse Name: N/A  . Number of Children: 2   Social History Main Topics  . Smoking status: Former Smoker    Types: Cigarettes    Quit date: 11/17/1980  . Smokeless tobacco: Never Used     Comment: smoked 1962-1982, up to 1 ppd  .  Alcohol Use: 1.2 oz/week    2 Glasses of wine per week     Comment: socially,2 / week  . Drug Use: No  . Sexual Activity: Yes    Birth Control/ Protection: Post-menopausal   Social History Narrative   Daily caffeine use   Married   Veterinary surgeon intermittently   Past Medical History  Diagnosis Date  . GERD (gastroesophageal reflux disease)   . Hyperlipidemia   . Gastroparesis     93 % retenton at 2 hrs  . Hernia     hiatal  . 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 Arthritis   .  Diverticulosis   . Bursitis     tronchanteric  . hypoglycemia   . Abnormal finding on EKG     non specific T changes; short PR  . Hyperlipidemia   . Complication of anesthesia     Severe PONV  . Hiatal hernia   . Diabetes mellitus without complication ([place])     Pt states she is pre-diabetic (12-06-13)  . H/O dilation and curettage    Past Surgical History  Procedure Laterality Date  . Hiatal hernia repair  2000  . Leg surgery      Trauma LLE/pins and plates  . Colonoscopy w/ polypectomy  2006 ,2007 & 2015    negative 2009; Dr Fuller Plan  . Tubal ligation    . Cardiac catheterization  2005    < 30 % lesion  . G 3 p 3    . Myomectomy  1999    uterine fibroid  . Dilatation & curettage/hysteroscopy with trueclear N/A 01/11/2014    Procedure: DILATATION & CURETTAGE/HYSTEROSCOPY WITH TRUCLEAR;  Surgeon: Anastasio Auerbach, MD;  Location: [facility];  Service: Gynecology;  Laterality: N/A;  . Upper endoscopy and esophageal dilation  02/20/15    Dr Roney Mans , [facility]  . Esophagram  03/29/15    Persistent narrowing at site of hiatal hernia; proximal dilation  . Esophageal manometry  03/22/15    Decreased peristalsis   Current Outpatient Prescriptions on File Prior to Visit  Medication Sig Dispense Refill  . acetaminophen (TYLENOL ARTHRITIS PAIN) 650 MG CR tablet Take 650 mg by mouth. 2 by mouth 2 times daily     . aspirin 81 MG chewable tablet Chew 81 mg by mouth daily.    Marland Kitchen atorvastatin (LIPITOR) 40 MG tablet TAKE 1 TABLET BY MOUTH DAILY 90 tablet 3  . esomeprazole (NEXIUM) 40 MG capsule Take 1 capsule (40 mg total) by mouth daily. --- Needs office visit before further refills   30 capsule 2  . fexofenadine (ALLEGRA) 180 MG tablet Take 180 mg by mouth daily.      Marland Kitchen glucose blood test strip Use 7-10 x a day - Verio 400 each 3  . hydrochlorothiazide (MICROZIDE) 12.5 MG capsule Take 1 capsule (12.5 mg total) by mouth daily. 90 capsule 3  . hyoscyamine (LEVSIN SL) 0.125 MG SL tablet DISSOLVE ONE OR TWO  TABLETS UNDER TONGUE EVERY FOUR TO SIX HOURS AS NEEDED for abdominal pain and cramping. 60 tablet 1  . Lancets MISC by Does not apply route. Lifespan ultra 2 test strips and lancets     . montelukast (SINGULAIR) 10 MG tablet 1 qd prn 90 tablet 3  . OVER THE COUNTER MEDICATION multistrain probiotic    . PENNSAID 2 % SOLN APPLY TWICE DAILY 112 g 1  . Calcium-Vitamin D-Vitamin K (VIACTIV PO) Take by mouth. Reported on  02/28/2016     Current Facility-Administered Medications on File Prior to Visit  Medication Dose Route Frequency Provider Last Rate Last Dose  . lidocaine (XYLOCAINE) 1 % (with pres) injection 10 mL  10 mL Infiltration Once Anastasio Auerbach, MD       Allergies  Allergen Reactions  . Domperidone     Stomach cramps  . Fentanyl Nausea And Vomiting  . Monistat [Miconazole Nitrate]     SEVERE BURNING  . Nsaids     REACTION: stomach complaints  . Oxycodone Hcl     REACTION: VOMITTING  . Codeine     nausea  . Erythromycin Ethylsuccinate     gastritis   Family History  Problem Relation Age of Onset  . Ulcers Father   . Diabetes Father   . Hypertension Father   . Heart attack Father     > 40  . Stroke Father     in 34s  . Heart attack Mother 25  . Diabetes Sister   . Heart attack Sister 82    CABG X 4  . Cancer Neg Hx   . Colitis Neg Hx    PE: BP 132/78 mmHg  Pulse 71  Temp(Src) 98.2 F (36.8 C) (Oral)  Wt 163 lb (73.936 kg)  SpO2 98%  LMP 11/18/2000 Body mass index is 28.88 kg/(m^2). Wt Readings from Last 3 Encounters:  02/28/16 163 lb (73.936 kg)  01/09/16 161 lb (73.029 kg)  10/01/15 159 lb 4 oz (72.235 kg)   Constitutional: overweight, in NAD Eyes: PERRLA, EOMI, no exophthalmos ENT: moist mucous membranes, no thyromegaly, no cervical lymphadenopathy Cardiovascular: RRR, No MRG Respiratory: CTA B Gastrointestinal: abdomen soft, NT, ND, BS+ Musculoskeletal: no deformities, strength intact in all 4, R foot in boot Skin: moist, warm, no  rashes Neurological: no tremor with outstretched hands, DTR normal in all 4  ASSESSMENT: 1. Diet-controlled DM2, without complications.  2. Hypoglycemia - possibly reactive -improved - we r/o Adrenal insufficiency at last visit: Component     Latest Ref Rng 02/02/2015 02/02/2015 02/02/2015         9:21 AM 10:02 AM 10:02 AM  Cortisol, Plasma      7.1 29.1 24.2  C206 ACTH     6 - 50 pg/mL 12     PLAN:  1. DM2, controlled - not on meds - well controlled, except for hypoglycemia episodes, which have improved after her Es stretching, but still present - she is more active - monitors steps on fitbit - last HbA1c levels were <7.0%; recheck today - will also check Lipids and LFTs (she ate oatmeal this am)  2. Patient with history of reactive (postprandial) hypoglycemia, with pm low CBG levels. She still eats snack to avoid drops, but she describes a drop at being in the 60s.   - advised her to start taking a scheduled snacks in the middle of the pm (15 g carbs)  - given list of options  Office Visit on 02/28/2016  Component Date Value Ref Range Status  . Hgb A1c MFr Bld 02/28/2016 6.7* 4.6 - 6.5 % Final   Glycemic Control Guidelines for People with Diabetes:Non Diabetic:  <6%Goal of Therapy: <7%Additional Action Suggested:  >8%   . Cholesterol 02/28/2016 159  0 - 200 mg/dL Final   ATP III Classification       Desirable:  < 200 mg/dL               Borderline High:  200 - 239 mg/dL  High:  > = 240 mg/dL  . Triglycerides 02/28/2016 154.0* 0.0 - 149.0 mg/dL Final   Normal:  <150 mg/dLBorderline High:  150 - 199 mg/dL  . HDL 02/28/2016 58.00  >39.00 mg/dL Final  . VLDL 02/28/2016 30.8  0.0 - 40.0 mg/dL Final  . LDL Cholesterol 02/28/2016 71  0 - 99 mg/dL Final  . Total CHOL/HDL Ratio 02/28/2016 3   Final                  Men          Women1/2 Average Risk     3.4          3.3Average Risk          5.0          4.42X Average Risk          9.6          7.13X Average Risk          15.0           11.0                      . NonHDL 02/28/2016 101.38   Final   NOTE:  Non-HDL goal should be 30 mg/dL higher than patient's LDL goal (i.e. LDL goal of < 70 mg/dL, would have non-HDL goal of < 100 mg/dL)  . Total Bilirubin 02/28/2016 0.7  0.2 - 1.2 mg/dL Final  . Bilirubin, Direct 02/28/2016 0.1  0.0 - 0.3 mg/dL Final  . Alkaline Phosphatase 02/28/2016 54  39 - 117 U/L Final  . AST 02/28/2016 23  0 - 37 U/L Final  . ALT 02/28/2016 23  0 - 35 U/L Final  . Total Protein 02/28/2016 7.0  6.0 - 8.3 g/dL Final  . Albumin 02/28/2016 4.4  3.5 - 5.2 g/dL Final   HbA1c slightly higher, otherwise the labs are good.

## 2016-02-28 NOTE — Patient Instructions (Addendum)
Please continue to check sugars as needed.  Try to schedule the 15g snack at the same time every afternoon (e.g. 3 pm).  Please come back in 4 months.  Please stop at the lab.

## 2016-03-20 ENCOUNTER — Encounter: Payer: Self-pay | Admitting: Family Medicine

## 2016-03-20 ENCOUNTER — Ambulatory Visit (INDEPENDENT_AMBULATORY_CARE_PROVIDER_SITE_OTHER): Payer: Managed Care, Other (non HMO) | Admitting: Family Medicine

## 2016-03-20 VITALS — BP 118/82 | HR 72 | Ht 63.0 in | Wt 162.0 lb

## 2016-03-20 DIAGNOSIS — M7751 Other enthesopathy of right foot: Secondary | ICD-10-CM | POA: Diagnosis not present

## 2016-03-20 NOTE — Progress Notes (Signed)
Pre visit review using our clinic review tool, if applicable. No additional management support is needed unless otherwise documented below in the visit note. 

## 2016-03-20 NOTE — Progress Notes (Signed)
  [name] [address], [place] [address] Phone: [phone] Subjective:     CC: Heel pain right follow up  [id number] Hannah Bryant is a 70 y.o. female coming in with complaint of heel pain. Patient was initially found to have calcific Achilles tendon as well as a retrocalcaneal bursitis. Patient calcific Achilles tendinosis resolved at last follow-up the patient was continuing have a retrocalcaneal bursitis. Was given an injection. States that 2 days after the injection she has been completely pain-free. No inflammation, able to do all daily activities. Still having difficulty when she is carrying heavy things with more of a shoulder pain but other than that she is very happy with the results.  Past medical history, social, surgical and family history all reviewed in electronic medical record.   Review of Systems: No headache, visual changes, nausea, vomiting, diarrhea, constipation, dizziness, abdominal pain, skin rash, fevers, chills, night sweats, weight loss, swollen lymph nodes, body aches, joint swelling, muscle aches, chest pain, shortness of breath, mood changes.   Objective Blood pressure 118/82, pulse 72, height 5\' 3"  (1.6 m), weight 162 lb (73.483 kg), last menstrual period 11/18/2000, SpO2 97 %.  General: No apparent distress alert and oriented x3 mood and affect normal, dressed appropriately.  HEENT: Pupils equal, extraocular movements intact  Respiratory: Patient's speak in full sentences and does not appear short of breath  Cardiovascular: No lower extremity edema, non tender, no erythema  Skin: Warm dry intact with no signs of infection or rash on extremities or on axial skeleton.  Abdomen: Soft nontender  Neuro: Cranial nerves II through XII are intact, neurovascularly intact in all extremities with 2+ DTRs and 2+ pulses.  Lymph: No lymphadenopathy of posterior or anterior cervical chain or axillae bilaterally.  Gait normal  with good balance and coordination.  MSK:  Non tender with full range of motion and good stability and symmetric strength and tone of shoulders, elbows, wrist, hip, knee and ankles bilaterally.  Right foot exam shows the patient does have a Haglund's nodule. Ino pain whatsoever in the area. Full range motion of the ankle. Neurovascular intact distally.         Impression and Recommendations:     This case required medical decision making of moderate complexity.

## 2016-03-20 NOTE — Assessment & Plan Note (Signed)
Resolve after injection. Follow-up as needed.

## 2016-03-21 ENCOUNTER — Telehealth: Payer: Self-pay | Admitting: Internal Medicine

## 2016-03-21 NOTE — Telephone Encounter (Signed)
Pt called want to see if [name] willing to work her in. Pt is on hyoscyamine and pt said that she got up this morning (stood up from the bed) and felt like " something on the left side shifted and some pressure". Pt is concern and want speak to [name] about this, does not want to see another PCP. Please help

## 2016-03-21 NOTE — Telephone Encounter (Signed)
Needs to be seen today but I do not think I can fit her in

## 2016-03-21 NOTE — Telephone Encounter (Signed)
LVM informing pt that the best option would be to go to Urgent Care or the ED, may call back if shed like to come to [facility].

## 2016-03-21 NOTE — Telephone Encounter (Signed)
Please advise 

## 2016-03-25 ENCOUNTER — Ambulatory Visit (INDEPENDENT_AMBULATORY_CARE_PROVIDER_SITE_OTHER): Payer: Managed Care, Other (non HMO) | Admitting: Internal Medicine

## 2016-03-25 ENCOUNTER — Encounter: Payer: Self-pay | Admitting: Internal Medicine

## 2016-03-25 VITALS — BP 140/90 | HR 64 | Temp 98.3°F | Resp 16 | Wt 163.0 lb

## 2016-03-25 DIAGNOSIS — G8929 Other chronic pain: Secondary | ICD-10-CM | POA: Insufficient documentation

## 2016-03-25 DIAGNOSIS — R1032 Left lower quadrant pain: Secondary | ICD-10-CM | POA: Insufficient documentation

## 2016-03-25 DIAGNOSIS — R109 Unspecified abdominal pain: Secondary | ICD-10-CM

## 2016-03-25 NOTE — Progress Notes (Signed)
Pre visit review using our clinic review tool, if applicable. No additional management support is needed unless otherwise documented below in the visit note. 

## 2016-03-25 NOTE — Progress Notes (Signed)
Subjective:    Patient ID: Hannah Bryant, female    DOB: 16-Mar-1946, 70 y.o.   MRN: [id number]  HPI She is here for an acute visit.   She has ongoing cramping pain in her left mid quadrant. She takes hyoscyamine daily and that helps.  The cramping does not move and is in one place.  It is more intense at times.  It is constant.  The hyoscyamine helps, but she does need to take it regularly.    4 days ago in the morning she got up from bed and it felt like whatever was in her left abdomen shifted toward the center.  It was not painful, but more intense discomfort.  It felt like a shift or movement toward the center of her abdomen.  The next morning she got up slowly and it was still pressure in there with a slight shifting sensation.  Her left side has been more intense.  She has continued to feel it and is nervous because this pain in the area is something she has worried about for years.  During the day she does not feel it as much.  This is a new discomfort or shifting sensation in the past 4 days.  She denies a change in bowel habits.  She denies any change in her GERD.  She denies nausea, fevers, change in appetite and change in weight.  She denies dysuria, hematuria and increased urination.    Her last colonoscopy was 01/2014 and had several polyps removed.  She has moderate diverticulosis in the sigmoid colon.  She is due for a repeat colonoscopy in 2018.   Medications and allergies reviewed with patient and updated if appropriate.  Patient Active Problem List   Diagnosis Date Noted  . Type 2 diabetes mellitus with hypoglycemia without coma, without long-term current use of insulin ([place]) 02/28/2016  . Reactive hypoglycemia 02/28/2016  . Retrocalcaneal bursitis 02/26/2016  . Osteopenia 01/09/2016  . Essential hypertension, benign 01/09/2016  . Achilles tendinosis 06/22/2015  . Junctional nevus of left thigh 12/23/2014  . Recurrent abdominal pain 11/28/2014  . Left rotator cuff  tear arthropathy 08/21/2014  . Squamous cell skin cancer 10/06/2013  . Type 2 diabetes mellitus with hypoglycemia ([place]) 07/02/2011  . HYPOGLYCEMIA, REACTIVE 02/04/2011  . BENIGN POSITIONAL VERTIGO 08/27/2009  . NONSPECIFIC ABNORM RESULTS THYROID FUNCT STUDY 07/13/2009  . History of colonic polyps 05/09/2008  . Gastroparesis 11/15/2007  . DIVERTICULOSIS, COLON 11/15/2007  . POSTMENOPAUSAL STATUS 08/19/2007  . GASTROESOPHAGEAL REFLUX DISEASE, SEVERE 05/12/2007  . Dysphagia 05/12/2007  . Nonspecific abnormal electrocardiogram (ECG) (EKG) 05/12/2007  . Hyperlipidemia 05/07/2007    Current Outpatient Prescriptions on File Prior to Visit  Medication Sig Dispense Refill  . acetaminophen (TYLENOL ARTHRITIS PAIN) 650 MG CR tablet Take 650 mg by mouth. 2 by mouth 2 times daily     . aspirin 81 MG chewable tablet Chew 81 mg by mouth daily.    Marland Kitchen atorvastatin (LIPITOR) 40 MG tablet TAKE 1 TABLET BY MOUTH DAILY 90 tablet 3  . Calcium-Vitamin D-Vitamin K (VIACTIV PO) Take by mouth. Reported on 02/28/2016    . esomeprazole (NEXIUM) 40 MG capsule Take 1 capsule (40 mg total) by mouth daily. --- Needs office visit before further refills   30 capsule 2  . fexofenadine (ALLEGRA) 180 MG tablet Take 180 mg by mouth daily.      Marland Kitchen glucose blood test strip Use 7-10 x a day - Verio 400 each 3  . hydrochlorothiazide (  MICROZIDE) 12.5 MG capsule Take 1 capsule (12.5 mg total) by mouth daily. 90 capsule 3  . hyoscyamine (LEVSIN SL) 0.125 MG SL tablet DISSOLVE ONE OR TWO TABLETS UNDER TONGUE EVERY FOUR TO SIX HOURS AS NEEDED for abdominal pain and cramping. 60 tablet 1  . Lancets MISC by Does not apply route. Lifespan ultra 2 test strips and lancets     . montelukast (SINGULAIR) 10 MG tablet 1 qd prn 90 tablet 3  . OVER THE COUNTER MEDICATION multistrain probiotic    . PENNSAID 2 % SOLN APPLY TWICE DAILY 112 g 1   Current Facility-Administered Medications on File Prior to Visit  Medication Dose Route Frequency  Provider Last Rate Last Dose  . lidocaine (XYLOCAINE) 1 % (with pres) injection 10 mL  10 mL Infiltration Once Anastasio Auerbach, MD        Past Medical History  Diagnosis Date  . GERD (gastroesophageal reflux disease)   . Hyperlipidemia   . Gastroparesis     93 % retenton at 2 hrs  . Hernia     hiatal  . 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 Arthritis   . Diverticulosis   . Bursitis     tronchanteric  . hypoglycemia   . Abnormal finding on EKG     non specific T changes; short PR  . Hyperlipidemia   . Complication of anesthesia     Severe PONV  . Hiatal hernia   . Diabetes mellitus without complication ([place])     Pt states she is pre-diabetic (12-06-13)  . H/O dilation and curettage     Past Surgical History  Procedure Laterality Date  . Hiatal hernia repair  2000  . Leg surgery      Trauma LLE/pins and plates  . Colonoscopy w/ polypectomy  2006 ,2007 & 2015    negative 2009; Dr Fuller Plan  . Tubal ligation    . Cardiac catheterization  2005    < 30 % lesion  . G 3 p 3    . Myomectomy  1999    uterine fibroid  . Dilatation & curettage/hysteroscopy with trueclear N/A 01/11/2014    Procedure: DILATATION & CURETTAGE/HYSTEROSCOPY WITH TRUCLEAR;  Surgeon: Anastasio Auerbach, MD;  Location: [facility];  Service: Gynecology;  Laterality: N/A;  . Upper endoscopy and esophageal dilation  02/20/15    Dr Roney Mans , [facility]  . Esophagram  03/29/15    Persistent narrowing at site of hiatal hernia; proximal dilation  . Esophageal manometry  03/22/15    Decreased peristalsis    Social History   Social History  . Marital Status: Married    Spouse Name: N/A  . Number of Children: 2  . Years of Education: N/A   Occupational History  .     Social History Main Topics  . Smoking status: Former Smoker    Types: Cigarettes    Quit date: 11/17/1980  . Smokeless tobacco: Never Used     Comment: smoked 1962-1982, up to 1 ppd  . Alcohol Use: 1.2 oz/week    2  Glasses of wine per week     Comment: socially,2 / week  . Drug Use: No  . Sexual Activity: Yes    Birth Control/ Protection: Post-menopausal   Other Topics Concern  . Not on file   Social History Narrative   Daily caffeine use   Married   Veterinary surgeon intermittently    Family History  Problem Relation  Age of Onset  . Ulcers Father   . Diabetes Father   . Hypertension Father   . Heart attack Father     > 49  . Stroke Father     in 35s  . Heart attack Mother 29  . Diabetes Sister   . Heart attack Sister 69    CABG X 4  . Cancer Neg Hx   . Colitis Neg Hx     Review of Systems  Constitutional: Negative for fever, chills, appetite change and unexpected weight change.  Gastrointestinal: Positive for abdominal pain. Negative for blood in stool.       No change in bowels  Genitourinary: Negative for dysuria and frequency.       Objective:   Filed Vitals:   03/25/16 0850  BP: 140/90  Pulse: 64  Temp: 98.3 F (36.8 C)  Resp: 16   Filed Weights   03/25/16 0850  Weight: 163 lb (73.936 kg)   Body mass index is 28.88 kg/(m^2).   Physical Exam  Constitutional: She appears well-developed and well-nourished. No distress.  Abdominal: Soft. Bowel sounds are normal. She exhibits no distension and no mass. There is no tenderness (pressure in left side of abdomen). There is no rebound and no guarding.  Musculoskeletal: She exhibits no edema.  Skin: She is not diaphoretic.        Assessment & Plan:   See Problem List for Assessment and Plan of chronic medical problems.

## 2016-03-25 NOTE — Patient Instructions (Addendum)
A Cat scan of your abdomen was ordered  We will call you with the results.     Medications reviewed and updated.  No changes recommended at this time.

## 2016-03-25 NOTE — Assessment & Plan Note (Signed)
Acute on chronic Pressure in left side of abdomen with palpation No change in bowels, colonoscopy up to date - h/o polyps and diverticulosis Will do a Ct scan of abdomen and pelvis to rule out beginnings of diverticulitis Follow up with GI if needed Continue hyoscyamine

## 2016-03-31 ENCOUNTER — Telehealth: Payer: Self-pay | Admitting: Emergency Medicine

## 2016-03-31 ENCOUNTER — Other Ambulatory Visit (INDEPENDENT_AMBULATORY_CARE_PROVIDER_SITE_OTHER): Payer: Managed Care, Other (non HMO)

## 2016-03-31 ENCOUNTER — Encounter: Payer: Self-pay | Admitting: Internal Medicine

## 2016-03-31 ENCOUNTER — Ambulatory Visit (INDEPENDENT_AMBULATORY_CARE_PROVIDER_SITE_OTHER)
Admission: RE | Admit: 2016-03-31 | Discharge: 2016-03-31 | Disposition: A | Discharge status: Home / Self Care | Payer: Managed Care, Other (non HMO) | Source: Ambulatory Visit | Attending: Internal Medicine | Admitting: Internal Medicine

## 2016-03-31 DIAGNOSIS — Z139 Encounter for screening, unspecified: Secondary | ICD-10-CM | POA: Diagnosis not present

## 2016-03-31 DIAGNOSIS — R109 Unspecified abdominal pain: Secondary | ICD-10-CM

## 2016-03-31 LAB — BASIC METABOLIC PANEL
BUN: 15 mg/dL (ref 6–23)
CHLORIDE: 102 meq/L (ref 96–112)
CO2: 32 mEq/L (ref 19–32)
Calcium: 9.8 mg/dL (ref 8.4–10.5)
Creatinine, Ser: 0.8 mg/dL (ref 0.40–1.20)
GFR: 75.07 mL/min (ref >60.00)
Glucose, Bld: 140 mg/dL — ABNORMAL HIGH (ref 70–99)
POTASSIUM: 4.4 meq/L (ref 3.5–5.1)
Sodium: 141 mEq/L (ref 135–145)

## 2016-03-31 MED ORDER — IOPAMIDOL (ISOVUE-300) INJECTION 61%
100.0000 mL | Freq: Once | INTRAVENOUS | Status: AC | PRN
  Administered 2016-03-31: 100 mL via INTRAVENOUS

## 2016-03-31 NOTE — Telephone Encounter (Signed)
BMET added for CT

## 2016-03-31 NOTE — Telephone Encounter (Signed)
Labs re-entered as STAT

## 2016-04-11 ENCOUNTER — Encounter: Payer: Self-pay | Admitting: Internal Medicine

## 2016-04-14 NOTE — Telephone Encounter (Signed)
Please call her and see how her symptoms are - not sure if she needs to be seen or just needs advice at this point.

## 2016-04-15 ENCOUNTER — Encounter: Payer: Self-pay | Admitting: Internal Medicine

## 2016-04-15 ENCOUNTER — Ambulatory Visit (INDEPENDENT_AMBULATORY_CARE_PROVIDER_SITE_OTHER): Payer: Managed Care, Other (non HMO) | Admitting: Internal Medicine

## 2016-04-15 VITALS — BP 162/100 | HR 81 | Temp 99.2°F | Resp 16 | Wt 167.0 lb

## 2016-04-15 DIAGNOSIS — J069 Acute upper respiratory infection, unspecified: Secondary | ICD-10-CM | POA: Diagnosis not present

## 2016-04-15 MED ORDER — AMOXICILLIN 500 MG PO CAPS: 500.0000 mg | ORAL_CAPSULE | Freq: Three times a day (TID) | ORAL | Status: DC

## 2016-04-15 NOTE — Progress Notes (Signed)
Subjective:    Patient ID: Hannah Bryant, female    DOB: 12-21-1945, 70 y.o.   MRN: [id number]  HPI She is here for an acute visit.   The cough is still there. The cold started on the 15th of may, the cough started shortly after.  She is coughing up creamy tan phlegm.  Her head is better.  She is using saline and the neti pot.  She has had some wheezing at night.  She denies any chest pain.  Yesterday she felt a little short of breath.    She tried delsym cough syrup and it did not help.  She also took airborne.    Medications and allergies reviewed with patient and updated if appropriate.  Patient Active Problem List   Diagnosis Date Noted  . Left sided abdominal pain 03/25/2016  . Type 2 diabetes mellitus with hypoglycemia without coma, without long-term current use of insulin ([place]) 02/28/2016  . Reactive hypoglycemia 02/28/2016  . Retrocalcaneal bursitis 02/26/2016  . Osteopenia 01/09/2016  . Essential hypertension, benign 01/09/2016  . Achilles tendinosis 06/22/2015  . Junctional nevus of left thigh 12/23/2014  . Recurrent abdominal pain 11/28/2014  . Left rotator cuff tear arthropathy 08/21/2014  . Squamous cell skin cancer 10/06/2013  . Type 2 diabetes mellitus with hypoglycemia ([place]) 07/02/2011  . HYPOGLYCEMIA, REACTIVE 02/04/2011  . BENIGN POSITIONAL VERTIGO 08/27/2009  . NONSPECIFIC ABNORM RESULTS THYROID FUNCT STUDY 07/13/2009  . History of colonic polyps 05/09/2008  . Gastroparesis 11/15/2007  . DIVERTICULOSIS, COLON 11/15/2007  . POSTMENOPAUSAL STATUS 08/19/2007  . GASTROESOPHAGEAL REFLUX DISEASE, SEVERE 05/12/2007  . Dysphagia 05/12/2007  . Nonspecific abnormal electrocardiogram (ECG) (EKG) 05/12/2007  . Hyperlipidemia 05/07/2007    Current Outpatient Prescriptions on File Prior to Visit  Medication Sig Dispense Refill  . acetaminophen (TYLENOL ARTHRITIS PAIN) 650 MG CR tablet Take 650 mg by mouth. 2 by mouth 2 times daily     . amoxicillin (AMOXIL) 500  MG capsule Take 500 mg by mouth 3 (three) times daily.    Marland Kitchen aspirin 81 MG chewable tablet Chew 81 mg by mouth daily.    Marland Kitchen atorvastatin (LIPITOR) 40 MG tablet TAKE 1 TABLET BY MOUTH DAILY 90 tablet 3  . Calcium-Vitamin D-Vitamin K (VIACTIV PO) Take by mouth. Reported on 02/28/2016    . esomeprazole (NEXIUM) 40 MG capsule Take 1 capsule (40 mg total) by mouth daily. --- Needs office visit before further refills   30 capsule 2  . fexofenadine (ALLEGRA) 180 MG tablet Take 180 mg by mouth daily.      Marland Kitchen glucose blood test strip Use 7-10 x a day - Verio 400 each 3  . hydrochlorothiazide (MICROZIDE) 12.5 MG capsule Take 1 capsule (12.5 mg total) by mouth daily. 90 capsule 3  . hyoscyamine (LEVSIN SL) 0.125 MG SL tablet DISSOLVE ONE OR TWO TABLETS UNDER TONGUE EVERY FOUR TO SIX HOURS AS NEEDED for abdominal pain and cramping. 60 tablet 1  . Lancets MISC by Does not apply route. Lifespan ultra 2 test strips and lancets     . montelukast (SINGULAIR) 10 MG tablet 1 qd prn 90 tablet 3  . OVER THE COUNTER MEDICATION multistrain probiotic    . PENNSAID 2 % SOLN APPLY TWICE DAILY 112 g 1   Current Facility-Administered Medications on File Prior to Visit  Medication Dose Route Frequency Provider Last Rate Last Dose  . lidocaine (XYLOCAINE) 1 % (with pres) injection 10 mL  10 mL Infiltration Once Timothy P Fontaine,  MD        Past Medical History  Diagnosis Date  . GERD (gastroesophageal reflux disease)   . Hyperlipidemia   . Gastroparesis     93 % retenton at 2 hrs  . Hernia     hiatal  . 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 Arthritis   . Diverticulosis   . Bursitis     tronchanteric  . hypoglycemia   . Abnormal finding on EKG     non specific T changes; short PR  . Hyperlipidemia   . Complication of anesthesia     Severe PONV  . Hiatal hernia   . Diabetes mellitus without complication ([place])     Pt states she is pre-diabetic (12-06-13)  . H/O dilation and  curettage     Past Surgical History  Procedure Laterality Date  . Hiatal hernia repair  2000  . Leg surgery      Trauma LLE/pins and plates  . Colonoscopy w/ polypectomy  2006 ,2007 & 2015    negative 2009; Dr Fuller Plan  . Tubal ligation    . Cardiac catheterization  2005    < 30 % lesion  . G 3 p 3    . Myomectomy  1999    uterine fibroid  . Dilatation & curettage/hysteroscopy with trueclear N/A 01/11/2014    Procedure: DILATATION & CURETTAGE/HYSTEROSCOPY WITH TRUCLEAR;  Surgeon: Anastasio Auerbach, MD;  Location: [facility];  Service: Gynecology;  Laterality: N/A;  . Upper endoscopy and esophageal dilation  02/20/15    Dr Roney Mans , [facility]  . Esophagram  03/29/15    Persistent narrowing at site of hiatal hernia; proximal dilation  . Esophageal manometry  03/22/15    Decreased peristalsis    Social History   Social History  . Marital Status: Married    Spouse Name: N/A  . Number of Children: 2  . Years of Education: N/A   Occupational History  .     Social History Main Topics  . Smoking status: Former Smoker    Types: Cigarettes    Quit date: 11/17/1980  . Smokeless tobacco: Never Used     Comment: smoked 1962-1982, up to 1 ppd  . Alcohol Use: 1.2 oz/week    2 Glasses of wine per week     Comment: socially,2 / week  . Drug Use: No  . Sexual Activity: Yes    Birth Control/ Protection: Post-menopausal   Other Topics Concern  . Not on file   Social History Narrative   Daily caffeine use   Married   Veterinary surgeon intermittently    Family History  Problem Relation Age of Onset  . Ulcers Father   . Diabetes Father   . Hypertension Father   . Heart attack Father     > 28  . Stroke Father     in 13s  . Heart attack Mother 70  . Diabetes Sister   . Heart attack Sister 83    CABG X 4  . Cancer Neg Hx   . Colitis Neg Hx     Review of Systems  Constitutional: Negative for fever (feels warm at times).  HENT: Positive for congestion. Negative for ear pain,  sinus pressure and sore throat.   Respiratory: Positive for cough, shortness of breath (mild, yesterday) and wheezing.   Neurological: Positive for headaches. Negative for dizziness and light-headedness.       Objective:   Filed Vitals:  04/15/16 1524  BP: 162/100  Pulse: 81  Temp: 99.2 F (37.3 C)  Resp: 16   Filed Weights   04/15/16 1524  Weight: 167 lb (75.751 kg)   Body mass index is 29.59 kg/(m^2).   Physical Exam GENERAL APPEARANCE: Appears stated age, well appearing, NAD EYES: conjunctiva clear, no icterus HEENT: bilateral tympanic membranes and ear canals normal, oropharynx with mild erythema, no thyromegaly, trachea midline, no cervical or supraclavicular lymphadenopathy LUNGS: Clear to auscultation without wheeze or crackles, unlabored breathing, good air entry bilaterally HEART: Normal S1,S2 without murmurs EXTREMITIES: Without clubbing, cyanosis, or edema      Assessment & Plan:   URI Likely bacterial - present for 15 days Will treat with an antibiotic Continue otc meds for symptoms Call if no improvement or if symptoms worsen

## 2016-04-15 NOTE — Patient Instructions (Signed)

## 2016-04-24 ENCOUNTER — Other Ambulatory Visit: Payer: Self-pay

## 2016-04-24 MED ORDER — AZITHROMYCIN 250 MG PO TABS: ORAL_TABLET | ORAL | Status: DC

## 2016-04-24 NOTE — Telephone Encounter (Signed)
Call her on [phone]

## 2016-04-24 NOTE — Telephone Encounter (Signed)
Patient called and said she is not feeling any better. She was here on May 30. Her cough is still bad and night sweats. She would like to know what to do. Patient is out of town. Please follow up. Thank you.

## 2016-04-24 NOTE — Telephone Encounter (Signed)
Lets try a different antibiotic - we can try a zpak or doxycycline 100mg  bid x 10 days.  Is there one that she has taken in the past that has worked/not worked?  Find out where we need to send it.

## 2016-04-24 NOTE — Telephone Encounter (Signed)
Please advise if there is anything the pt can do.

## 2016-04-24 NOTE — Telephone Encounter (Signed)
Pt called to check up on this request. Please  Call her back at [phone], she also has question about antibiotic below before we send it in.

## 2016-04-24 NOTE — Telephone Encounter (Signed)
Talked with patient, she prefers to try zpac---i have called zpac into [facility] [phone] advised

## 2016-06-30 ENCOUNTER — Ambulatory Visit (INDEPENDENT_AMBULATORY_CARE_PROVIDER_SITE_OTHER): Payer: Managed Care, Other (non HMO) | Admitting: Internal Medicine

## 2016-06-30 ENCOUNTER — Encounter: Payer: Self-pay | Admitting: Internal Medicine

## 2016-06-30 VITALS — BP 122/78 | HR 84 | Ht 63.0 in | Wt 159.0 lb

## 2016-06-30 DIAGNOSIS — E11649 Type 2 diabetes mellitus with hypoglycemia without coma: Secondary | ICD-10-CM

## 2016-06-30 DIAGNOSIS — E162 Hypoglycemia, unspecified: Secondary | ICD-10-CM

## 2016-06-30 LAB — MICROALBUMIN / CREATININE URINE RATIO
Creatinine,U: 230 mg/dL
MICROALB/CREAT RATIO: 0.5 mg/g (ref 0.0–30.0)
Microalb, Ur: 1.2 mg/dL (ref 0.0–1.9)

## 2016-06-30 LAB — POCT GLYCOSYLATED HEMOGLOBIN (HGB A1C): HEMOGLOBIN A1C: 6.5

## 2016-06-30 MED ORDER — GLUCOSE BLOOD VI STRP: ORAL_STRIP | 3 refills | Status: DC

## 2016-06-30 NOTE — Progress Notes (Signed)
Patient ID: Hannah Bryant, female   DOB: 04-13-1946, 70 y.o.   MRN: [id number]  HPI: Hannah Bryant is a 70 y.o.-year-old female, returning for f/u for DM2, dx in ~2009 (or earlier), diet-controlled, with complications (reactive hypoglycemia). Previously seen by Dr. Chalmers Cater. Last 4 mo ago.   Reactive hypoglycemia:  Reviewed hx: Pt described that has had hypoglycemia every pm for "ages". Sometimes: sweating, feeling hot, nausea, sometimes vomiting.  She may need to take a nap afterward.  She started to see Dr Roney Mans ([place]) >> hypoglycemic episodes = better. She had an EGD, Ba swallow test >> she had a stricture at the site of her previous hiatal hernia with food retention in the Esophagus long after she eats (procedure: Spring 2016). She had Esophageal dilation. She also had a capsule endoscopy.   Lowest CBG now 49x1, OTW few 60s - but usually eats a glucose gel - 15-30g carbs or OJ)  She has a fitbit: 10,000 step goal.  She will go to [place] and then [place] in 08/2016.  DM2:  Last hemoglobin A1c was: Lab Results  Component Value Date   HGBA1C 6.7 (H) 02/28/2016   HGBA1C 6.4 09/21/2015   HGBA1C 6.9 (H) 12/01/2014   Pt is not on meds for her DM2. She has been on Metformin >> felt poorly on it.   Pt checks her sugars 6x a day and they are: - am: 99-116 >> 124-134 >> 130-145 >> 132-160 - 2h after b'fast: n/c >> 140 - before lunch: n/c >> 100-150, 198, 222 - after lunch:  if not snacking:40s, if snacking: 140s >> 180-200 then 68-100 later in the pm >> 115-189 - before dinner: n/c >> 115, 123-150, 179, 215 - 2h after dinner: n/c >> 49x1, 61, 64, 74-158, 167 - bedtime: n/c  - nighttime: n/c No lows. Lowest sugar was 40s >> 68 >> 49 (delayed dinner, had no sxs) >> drank OJ and got honey.  she has hypoglycemia awareness at 80.  Highest sugar was 200s.  Glucometer: 2: OneTouch Verio and Freestyle Lite  Pt's meals are: - Breakfast: oatmeal, blueberries, walnuts - mid-am  snack: PB crackers x 2 - Lunch: soup or salad + chicken or cheese + ham, sometimes milk 1/2 glass (skim) - mid-pm snack: milk or peanuts or crackers - Dinner: chicken/pork + stirfry veggies or salad = brown rice. No sodas, teas.  She was seeing our nutritionist, Antonieta Iba. Last visit 02/2015.  - no CKD, last BUN/creatinine:  Lab Results  Component Value Date   BUN 15 03/31/2016   CREATININE 0.80 03/31/2016   - last set of lipids: Lab Results  Component Value Date   CHOL 159 02/28/2016   HDL 58.00 02/28/2016   LDLCALC 71 02/28/2016   TRIG 154.0 (H) 02/28/2016   CHOLHDL 3 02/28/2016  On Lipitor.  ROS: Constitutional: no weight loss or gain, + fatigue, no nocturia, +hot flushes Eyes: no blurry vision, no xerophthalmia ENT: no sore throat, no nodules palpated in throat, no dysphagia/odynophagia, no hoarseness Cardiovascular: no CP/SOB/palpitations/leg swelling Respiratory: no cough/SOB Gastrointestinal: no N/V/+ D/no C/+ heartburn Musculoskeletal: no muscle/joint aches Skin: no rashes Neurological: no tremors/numbness/tingling/dizziness  I reviewed pt's medications, allergies, PMH, social hx, family hx, and changes were documented in the history of present illness. Otherwise, unchanged from my initial visit note.  History   Social History  . Marital Status: Married    Spouse Name: N/A  . Number of Children: 2   Social History Main Topics  .  Smoking status: Former Smoker    Types: Cigarettes    Quit date: 11/17/1980  . Smokeless tobacco: Never Used     Comment: smoked 1962-1982, up to 1 ppd  . Alcohol Use: 1.2 oz/week    2 Glasses of wine per week     Comment: socially,2 / week  . Drug Use: No  . Sexual Activity: Yes    Birth Control/ Protection: Post-menopausal   Social History Narrative   Daily caffeine use   Married   Veterinary surgeon intermittently   Past Medical History:  Diagnosis Date  . Abnormal finding on EKG    non specific T changes;  short PR  . Arthritis   . Bursitis    tronchanteric  . Complication of anesthesia    Severe PONV  . Diabetes mellitus without complication ([place])    Pt states she is pre-diabetic (12-06-13)  . Diverticulosis   . Gastroparesis    93 % retenton at 2 hrs  . GERD (gastroesophageal reflux disease)   . H/O dilation and curettage   . Hernia    hiatal  . Hiatal hernia   . Hyperlipidemia   . Hyperlipidemia   . hypoglycemia   . 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 CARDIAC CATHETERIZATION  2005   < 30 % lesion  . COLONOSCOPY W/ POLYPECTOMY  2006 ,2007 & 2015   negative 2009; Dr Fuller Plan  . DILATATION & CURETTAGE/HYSTEROSCOPY WITH TRUECLEAR N/A 01/11/2014   Procedure: DILATATION & CURETTAGE/HYSTEROSCOPY WITH TRUCLEAR;  Surgeon: Anastasio Auerbach, MD;  Location: [facility];  Service: Gynecology;  Laterality: N/A;  . ESOPHAGEAL MANOMETRY  03/22/15   Decreased peristalsis  . Esophagram  03/29/15   Persistent narrowing at site of hiatal hernia; proximal dilation  . G 3 P 3    . HIATAL HERNIA REPAIR  2000  . LEG SURGERY     Trauma LLE/pins and plates  . myomectomy  1999   uterine fibroid  . TUBAL LIGATION    . Upper endoscopy and esophageal dilation  02/20/15   Dr Roney Mans , [facility]   Current Outpatient Prescriptions on File Prior to Visit  Medication Sig Dispense Refill  . acetaminophen (TYLENOL ARTHRITIS PAIN) 650 MG CR tablet Take 650 mg by mouth. 2 by mouth 2 times daily     . aspirin 81 MG chewable tablet Chew 81 mg by mouth daily.    Marland Kitchen atorvastatin (LIPITOR) 40 MG tablet TAKE 1 TABLET BY MOUTH DAILY 90 tablet 3  . esomeprazole (NEXIUM) 40 MG capsule Take 1 capsule (40 mg total) by mouth daily. --- Needs office visit before further refills   30 capsule 2  . fexofenadine (ALLEGRA) 180 MG tablet Take 180 mg by mouth daily.      Marland Kitchen glucose blood test strip Use 7-10 x a day - Verio 400 each 3  . hydrochlorothiazide  (MICROZIDE) 12.5 MG capsule Take 1 capsule (12.5 mg total) by mouth daily. 90 capsule 3  . hyoscyamine (LEVSIN SL) 0.125 MG SL tablet DISSOLVE ONE OR TWO TABLETS UNDER TONGUE EVERY FOUR TO SIX HOURS AS NEEDED for abdominal pain and cramping. 60 tablet 1  . Lancets MISC by Does not apply route. Lifespan ultra 2 test strips and lancets     . montelukast (SINGULAIR) 10 MG tablet 1 qd prn 90 tablet 3  . OVER THE COUNTER MEDICATION multistrain probiotic    . PENNSAID 2 %  SOLN APPLY TWICE DAILY 112 g 1  . amoxicillin (AMOXIL) 500 MG capsule Take 1 capsule (500 mg total) by mouth 3 (three) times daily. (Patient not taking: Reported on 06/30/2016) 21 capsule 0  . azithromycin (ZITHROMAX) 250 MG tablet Take as directed (Patient not taking: Reported on 06/30/2016) 6 tablet 0  . Calcium-Vitamin D-Vitamin K (VIACTIV PO) Take by mouth. Reported on 02/28/2016     Current Facility-Administered Medications on File Prior to Visit  Medication Dose Route Frequency Provider Last Rate Last Dose  . lidocaine (XYLOCAINE) 1 % (with pres) injection 10 mL  10 mL Infiltration Once Anastasio Auerbach, MD       Allergies  Allergen Reactions  . Domperidone     Stomach cramps  . Fentanyl Nausea And Vomiting  . Monistat [Miconazole Nitrate]     SEVERE BURNING  . Nsaids     REACTION: stomach complaints  . Oxycodone Hcl     REACTION: VOMITTING  . Codeine     nausea  . Erythromycin Ethylsuccinate     gastritis   Family History  Problem Relation Age of Onset  . Ulcers Father   . Diabetes Father   . Hypertension Father   . Heart attack Father     > 70  . Stroke Father     in 78s  . Heart attack Mother 68  . Diabetes Sister   . Heart attack Sister 43    CABG X 4  . Cancer Neg Hx   . Colitis Neg Hx    PE: BP 122/78 (BP Location: Left Arm, Patient Position: Sitting)   Pulse 84   Ht 5\' 3"  (1.6 m)   Wt 159 lb (72.1 kg)   LMP 11/18/2000   SpO2 96%   BMI 28.17 kg/m  Body mass index is 28.17 kg/m. Wt  Readings from Last 3 Encounters:  06/30/16 159 lb (72.1 kg)  04/15/16 167 lb (75.8 kg)  03/25/16 163 lb (73.9 kg)   Constitutional: overweight, in NAD Eyes: PERRLA, EOMI, no exophthalmos ENT: moist mucous membranes, no thyromegaly, no cervical lymphadenopathy Cardiovascular: RRR, No MRG Respiratory: CTA B Gastrointestinal: abdomen soft, NT, ND, BS+ Musculoskeletal: no deformities, strength intact in all 4 Skin: moist, warm, no rashes Neurological: no tremor with outstretched hands, DTR normal in all 4  ASSESSMENT: 1. Diet-controlled DM2, without complications.  2. Hypoglycemia - possibly reactive - improved - we r/o Adrenal insufficiency: Component     Latest Ref Rng 02/02/2015 02/02/2015 02/02/2015         9:21 AM 10:02 AM 10:02 AM  Cortisol, Plasma      7.1 29.1 24.2  C206 ACTH     6 - 50 pg/mL 12     PLAN:  1. DM2, fairly well controlled - diet-controlled - sugars higher lately as she is trying to avoid hypoglycemia - still has few postprandial hypoglycemia episodes, which have improved after her Es stretching, but still present - she is still active - monitors steps on fitbit >> aims for 10,000 steps a day - will go out of the country for 19 days in 2 mo >> will let me know if needs strips for then -  HbA1c level: 6.5% today - will check an ACR  2. Patient with history of reactive (postprandial) hypoglycemia, with pm low CBG levels. She still eats snack to avoid drops, but she describes a drop at being in the 60s. She had only 1 CBG <60, at 48 (was at the beach, more active,  dinner delayed and then could not eat b/c dry mouth) - discussed to avoid liquids with meals - discussed about setting alarms for snacks - she prefers Glu gel >> usually needs 2 packets (30 g carbs)  Office Visit on 06/30/2016  Component Date Value Ref Range Status  . Microalb, Ur 06/30/2016 1.2  0.0 - 1.9 mg/dL Final  . Creatinine,U 06/30/2016 230.0  mg/dL Final  . Microalb Creat Ratio  06/30/2016 0.5  0.0 - 30.0 mg/g Final  . Hemoglobin A1C 06/30/2016 6.5   Final   Normal UACR.  Philemon Kingdom, MD PhD [facility]

## 2016-06-30 NOTE — Patient Instructions (Addendum)
Please continue to check sugars as needed.  Please come back in 4 months.  Please stop at the lab.

## 2016-07-29 ENCOUNTER — Ambulatory Visit: Payer: Managed Care, Other (non HMO)

## 2016-07-30 ENCOUNTER — Ambulatory Visit (INDEPENDENT_AMBULATORY_CARE_PROVIDER_SITE_OTHER): Payer: Managed Care, Other (non HMO)

## 2016-07-30 DIAGNOSIS — Z23 Encounter for immunization: Secondary | ICD-10-CM

## 2016-09-08 ENCOUNTER — Other Ambulatory Visit: Payer: Self-pay | Admitting: *Deleted

## 2016-09-08 MED ORDER — HYOSCYAMINE SULFATE 0.125 MG SL SUBL: SUBLINGUAL_TABLET | SUBLINGUAL | 0 refills | Status: DC

## 2016-09-09 ENCOUNTER — Other Ambulatory Visit: Payer: Self-pay

## 2016-09-09 MED ORDER — GLUCOSE BLOOD VI STRP: ORAL_STRIP | 5 refills | Status: DC

## 2016-09-09 MED ORDER — GLUCOSE BLOOD VI STRP: ORAL_STRIP | 1 refills | Status: DC

## 2016-09-12 IMAGING — CR DG SHOULDER 2+V*L*
3 series · 3 of 3 positions shown · non-contrast
Comparison: Limited views of the left shoulder from a chest x-ray
March 05, 2004

CLINICAL DATA: Three-month history of lateral pain without known
injury.

EXAM:
LEFT SHOULDER - 2+ VIEW

[view not recorded (1 of 3)]
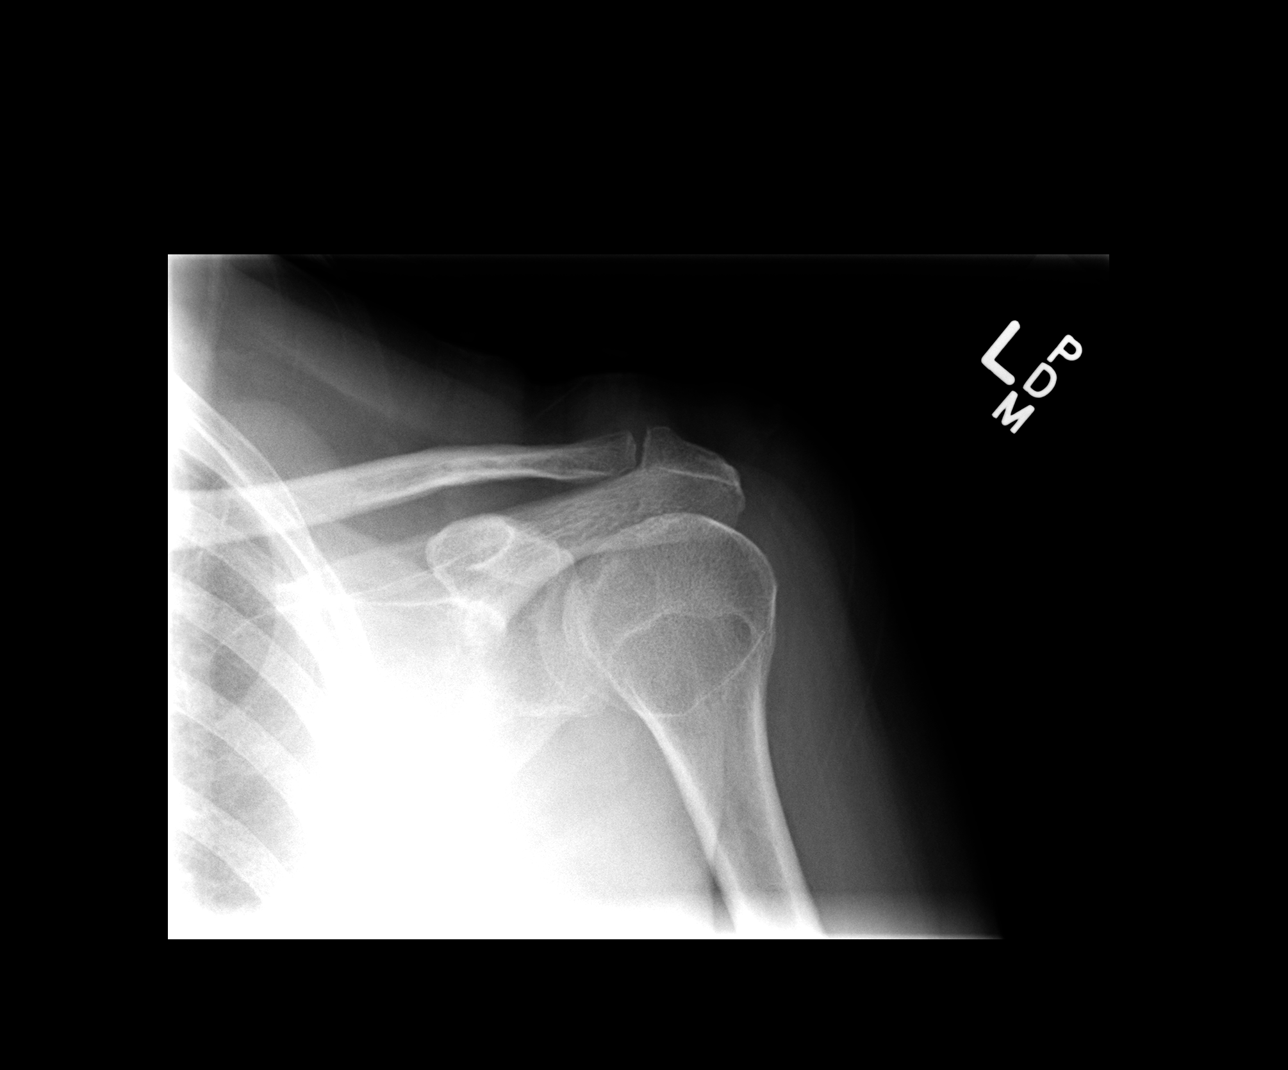

[view not recorded (2 of 3)]
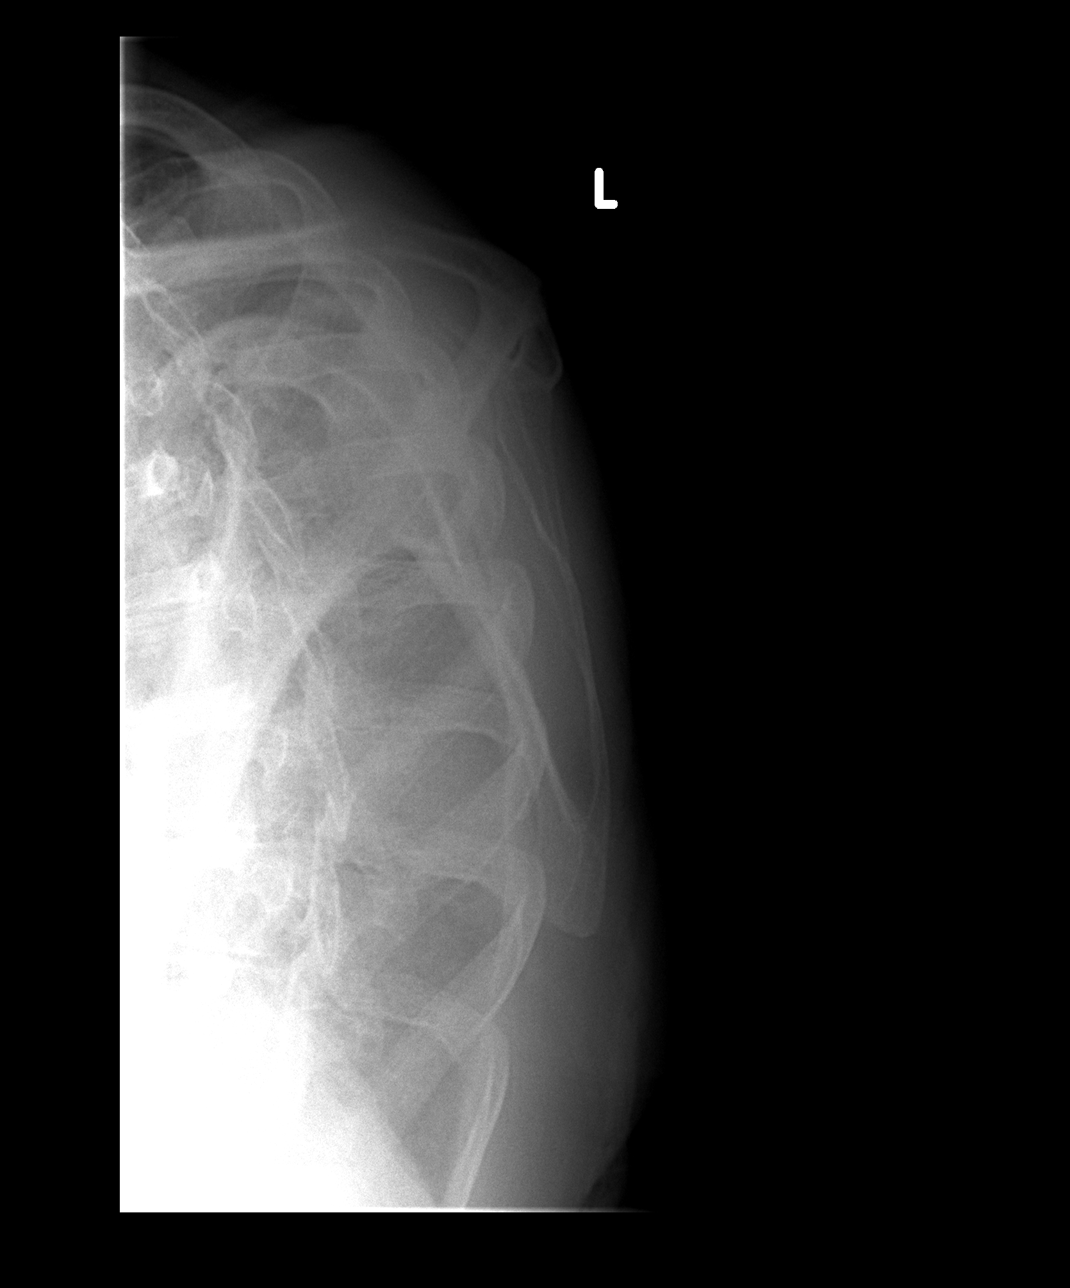

[view not recorded (3 of 3)]
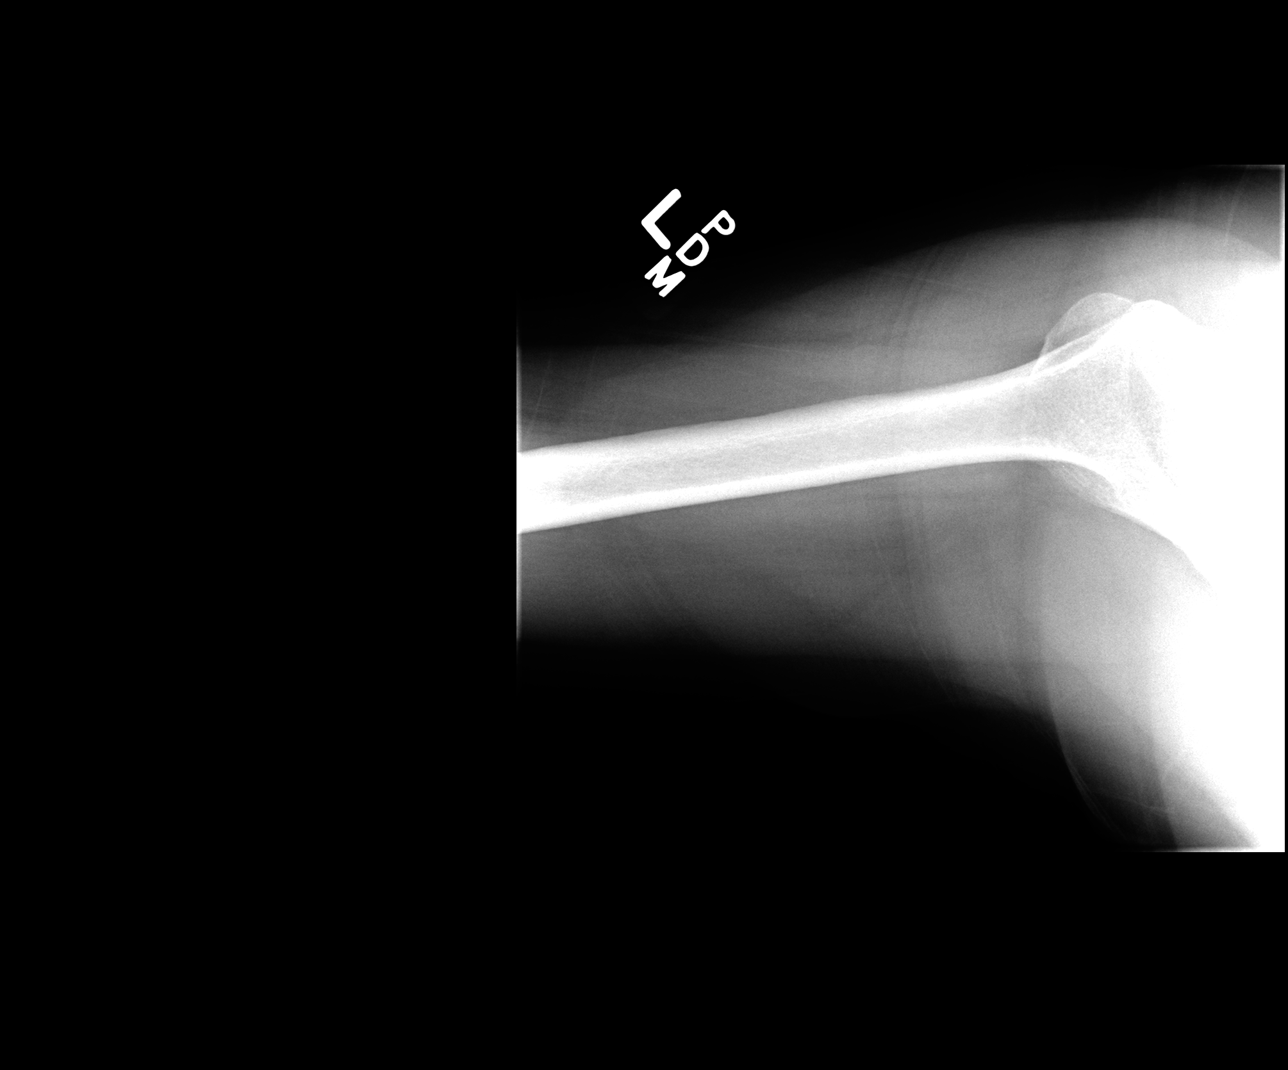

[3 of 3 positions shown; findings below may reference images not displayed]

FINDINGS: The bones of the left shoulder are adequately mineralized. The
glenohumeral joint and the AC joint exhibit no acute abnormalities.
There is mild degenerative change of the AC joint. There is no acute
or healing fracture. The observed portions of the left clavicle and
upper left ribs are unremarkable. The soft tissues are unremarkable.
IMPRESSION: There is no acute bony abnormality of the left shoulder. There is
mild degenerative AC joint change.

## 2016-10-30 ENCOUNTER — Ambulatory Visit: Payer: Managed Care, Other (non HMO) | Admitting: Internal Medicine

## 2016-11-03 ENCOUNTER — Ambulatory Visit: Payer: Managed Care, Other (non HMO) | Admitting: Internal Medicine

## 2016-11-06 ENCOUNTER — Encounter: Payer: Self-pay | Admitting: Internal Medicine

## 2016-11-06 ENCOUNTER — Ambulatory Visit (INDEPENDENT_AMBULATORY_CARE_PROVIDER_SITE_OTHER): Payer: Medicare Other | Admitting: Internal Medicine

## 2016-11-06 VITALS — BP 136/84 | HR 68 | Temp 97.9°F | Resp 16 | Ht 63.0 in | Wt 159.0 lb

## 2016-11-06 DIAGNOSIS — I10 Essential (primary) hypertension: Secondary | ICD-10-CM | POA: Diagnosis not present

## 2016-11-06 DIAGNOSIS — R109 Unspecified abdominal pain: Secondary | ICD-10-CM | POA: Diagnosis not present

## 2016-11-06 DIAGNOSIS — Z1159 Encounter for screening for other viral diseases: Secondary | ICD-10-CM | POA: Diagnosis not present

## 2016-11-06 DIAGNOSIS — E11649 Type 2 diabetes mellitus with hypoglycemia without coma: Secondary | ICD-10-CM

## 2016-11-06 DIAGNOSIS — E7800 Pure hypercholesterolemia, unspecified: Secondary | ICD-10-CM

## 2016-11-06 DIAGNOSIS — K2100 Gastro-esophageal reflux disease with esophagitis, without bleeding: Secondary | ICD-10-CM

## 2016-11-06 DIAGNOSIS — M8580 Other specified disorders of bone density and structure, unspecified site: Secondary | ICD-10-CM

## 2016-11-06 DIAGNOSIS — Z13820 Encounter for screening for osteoporosis: Secondary | ICD-10-CM

## 2016-11-06 DIAGNOSIS — R946 Abnormal results of thyroid function studies: Secondary | ICD-10-CM

## 2016-11-06 DIAGNOSIS — Z0000 Encounter for general adult medical examination without abnormal findings: Secondary | ICD-10-CM | POA: Diagnosis not present

## 2016-11-06 DIAGNOSIS — E2839 Other primary ovarian failure: Secondary | ICD-10-CM

## 2016-11-06 MED ORDER — HYDROCHLOROTHIAZIDE 12.5 MG PO CAPS: 12.5000 mg | ORAL_CAPSULE | Freq: Every day | ORAL | 3 refills | Status: DC

## 2016-11-06 MED ORDER — ATORVASTATIN CALCIUM 40 MG PO TABS: ORAL_TABLET | ORAL | 3 refills | Status: DC

## 2016-11-06 MED ORDER — HYOSCYAMINE SULFATE 0.125 MG SL SUBL: SUBLINGUAL_TABLET | SUBLINGUAL | 3 refills | Status: DC

## 2016-11-06 MED ORDER — MONTELUKAST SODIUM 10 MG PO TABS: ORAL_TABLET | ORAL | 3 refills | Status: DC

## 2016-11-06 NOTE — Progress Notes (Signed)
Subjective:    Patient ID: Hannah Bryant, female    DOB: [birth year], 70 y.o.   MRN: [id number]   HPI Here for welcome to medicare wellness exam and an annual physical exam.   I have personally reviewed and have noted 1.The patient's medical and social history 2.Their use of alcohol, tobacco or illicit drugs 3.Their current medications and supplements 4.The patient's functional ability including ADL's, fall risks, home safety                        risks and hearing or visual impairment. 5.Diet and physical activities 6.Evidence for depression or mood disorders 7.Care team reviewed - Endocrine - Dr Cruzita Lederer, Dermatology - Dr                  Amy [place], Eye - Dr Bing Plume, [place] - [place]   Are there smokers in your home (other than you)? No  Risk Factors Exercise: walking, trainer once a week -weights, balance exercises Dietary issues discussed: well balanced   Cardiac risk factors: advanced age, hypertension, hyperlipidemia  Depression Screen  Have you felt down, depressed or hopeless? No  Have you felt little interest or pleasure in doing things?  No  Activities of Daily Living In your present state of health, do you have any difficulty performing the following activities?:  Driving? No Managing money?  No Feeding yourself? No Getting from bed to chair? No Climbing a flight of stairs? Slight difficulty - hip pain Preparing food and eating?: No Bathing or showering? No Getting dressed: No Getting to/using the toilet? No Moving around from place to place: No In the past year have you fallen or had a near fall?: No   Are you sexually active?  No  Do you have more than one partner?  N/A  Hearing Difficulties:  Do you often ask people to speak up or repeat themselves? No Do you experience ringing or noises in your ears? No Do you have difficulty understanding soft or whispered voices? No Vision:   Any change in vision:  Yes, floater             Up to date with eye exam: yes Memory:  Do you feel that you have a problem with memory? No  Do you often misplace items? No  Do you feel safe at home?  Yes  Cognitive Testing  Alert, Orientated? Yes  Normal Appearance? Yes  Recall of three objects?  Yes  Can perform simple calculations? Yes  Displays appropriate judgment? Yes  Can read the correct time from a watch face? Yes   Advanced Directives have been discussed with the patient? Yes - in place   Medications and allergies reviewed with patient and updated if appropriate.  Patient Active Problem List   Diagnosis Date Noted  . Left sided abdominal pain 03/25/2016  . Type 2 diabetes mellitus with hypoglycemia without coma, without long-term current use of insulin ([place]) 02/28/2016  . Reactive hypoglycemia 02/28/2016  . Retrocalcaneal bursitis 02/26/2016  . Osteopenia 01/09/2016  . Essential hypertension, benign 01/09/2016  . Achilles tendinosis 06/22/2015  . Junctional nevus of left thigh 12/23/2014  . Recurrent abdominal pain 11/28/2014  . Left rotator cuff tear arthropathy 08/21/2014  . Squamous cell skin cancer 10/06/2013  . Type 2 diabetes mellitus with hypoglycemia ([place]) 07/02/2011  . HYPOGLYCEMIA, REACTIVE 02/04/2011  . BENIGN POSITIONAL VERTIGO 08/27/2009  . NONSPECIFIC ABNORM RESULTS THYROID FUNCT STUDY 07/13/2009  . History of colonic  polyps 05/09/2008  . Gastroparesis 11/15/2007  . DIVERTICULOSIS, COLON 11/15/2007  . GASTROESOPHAGEAL REFLUX DISEASE, SEVERE 05/12/2007  . Dysphagia 05/12/2007  . Nonspecific abnormal electrocardiogram (ECG) (EKG) 05/12/2007  . Hyperlipidemia 05/07/2007    Current Outpatient Prescriptions on File Prior to Visit  Medication Sig Dispense Refill  . acetaminophen (TYLENOL ARTHRITIS PAIN) 650 MG CR tablet Take 650 mg by mouth. 2 by mouth 2 times daily     . aspirin 81 MG chewable tablet Chew 81 mg by mouth daily.    Marland Kitchen atorvastatin  (LIPITOR) 40 MG tablet TAKE 1 TABLET BY MOUTH DAILY 90 tablet 3  . Calcium-Vitamin D-Vitamin K (VIACTIV PO) Take by mouth. Reported on 02/28/2016    . esomeprazole (NEXIUM) 40 MG capsule Take 1 capsule (40 mg total) by mouth daily. --- Needs office visit before further refills   30 capsule 2  . fexofenadine (ALLEGRA) 180 MG tablet Take 180 mg by mouth daily.      Marland Kitchen glucose blood test strip Use 7-10 x a day - Verio 900 each 1  . hydrochlorothiazide (MICROZIDE) 12.5 MG capsule Take 1 capsule (12.5 mg total) by mouth daily. 90 capsule 3  . hyoscyamine (LEVSIN SL) 0.125 MG SL tablet DISSOLVE ONE OR TWO TABLETS UNDER TONGUE EVERY FOUR TO SIX HOURS AS NEEDED for abdominal pain and cramping. 120 tablet 0  . Lancets MISC by Does not apply route. Lifespan ultra 2 test strips and lancets     . montelukast (SINGULAIR) 10 MG tablet 1 qd prn 90 tablet 3  . OVER THE COUNTER MEDICATION multistrain probiotic    . PENNSAID 2 % SOLN APPLY TWICE DAILY (Patient taking differently: APPLY TWICE DAILY PRN) 112 g 1   No current facility-administered medications on file prior to visit.     Past Medical History:  Diagnosis Date  . Abnormal finding on EKG    non specific T changes; short PR  . Arthritis   . Bursitis    tronchanteric  . Complication of anesthesia    Severe PONV  . Diabetes mellitus without complication ([place])    Pt states she is pre-diabetic (12-06-13)  . Diverticulosis   . Gastroparesis    93 % retenton at 2 hrs  . GERD (gastroesophageal reflux disease)   . H/O dilation and curettage   . Hernia    hiatal  . Hiatal hernia   . Hyperlipidemia   . Hyperlipidemia   . hypoglycemia   . 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 CARDIAC CATHETERIZATION  2005   < 30 % lesion  . COLONOSCOPY W/ POLYPECTOMY  2006 ,2007 & 2015   negative 2009; Dr Fuller Plan  . DILATATION & CURETTAGE/HYSTEROSCOPY WITH TRUECLEAR N/A  01/11/2014   Procedure: DILATATION & CURETTAGE/HYSTEROSCOPY WITH TRUCLEAR;  Surgeon: Anastasio Auerbach, MD;  Location: [facility];  Service: Gynecology;  Laterality: N/A;  . ESOPHAGEAL MANOMETRY  03/22/15   Decreased peristalsis  . Esophagram  03/29/15   Persistent narrowing at site of hiatal hernia; proximal dilation  . G 3 P 3    . HIATAL HERNIA REPAIR  2000  . LEG SURGERY     Trauma LLE/pins and plates  . myomectomy  1999   uterine fibroid  . TUBAL LIGATION    . Upper endoscopy and esophageal dilation  02/20/15   Dr Roney Mans , [facility]    Social History   Social History  . Marital status:  Married    Spouse name: N/A  . Number of children: 2  . Years of education: N/A   Occupational History  .  Unemployed   Social History Main Topics  . Smoking status: Former Smoker    Types: Cigarettes    Quit date: 11/17/1980  . Smokeless tobacco: Never Used     Comment: smoked 1962-1982, up to 1 ppd  . Alcohol use 1.2 oz/week    2 Glasses of wine per week     Comment: socially,2 / week  . Drug use: No  . Sexual activity: Yes    Birth control/ protection: Post-menopausal   Other Topics Concern  . Not on file   Social History Narrative   Daily caffeine use   Married   Veterinary surgeon intermittently    Family History  Problem Relation Age of Onset  . Ulcers Father   . Diabetes Father   . Hypertension Father   . Heart attack Father     > 30  . Stroke Father     in 67s  . Heart attack Mother 36  . Diabetes Sister   . Heart attack Sister 36    CABG X 4  . Cancer Neg Hx   . Colitis Neg Hx     Review of Systems  Constitutional: Negative for appetite change, chills, fatigue (draggy in afternoon) and fever.  HENT: Negative for hearing loss and tinnitus.   Eyes: Negative for visual disturbance.  Respiratory: Negative for cough, shortness of breath and wheezing.   Cardiovascular: Negative for chest pain, palpitations and leg swelling.  Gastrointestinal: Positive for abdominal  pain and diarrhea (intermittent). Negative for blood in stool, constipation and nausea.       Gerd occasionally  Genitourinary: Negative for dysuria and hematuria.  Musculoskeletal: Positive for arthralgias.  Skin: Negative for color change and rash.  Neurological: Negative for dizziness, light-headedness and headaches.  Psychiatric/Behavioral: Negative for dysphoric mood. The patient is not nervous/anxious.        Objective:   Vitals:   11/06/16 0830  BP: 136/84  Pulse: 68  Resp: 16  Temp: 97.9 F (36.6 C)   Filed Weights   11/06/16 0830  Weight: 159 lb (72.1 kg)   Body mass index is 28.17 kg/m.   Physical Exam Constitutional: She appears well-developed and well-nourished. No distress.  HENT:  Head: Normocephalic and atraumatic.  Right Ear: External ear normal. Normal ear canal and TM Left Ear: External ear normal.  Normal ear canal and TM Mouth/Throat: Oropharynx is clear and moist.  Eyes: Conjunctivae and EOM are normal.  Neck: Neck supple. No tracheal deviation present. No thyromegaly present.  No carotid bruit  Cardiovascular: Normal rate, regular rhythm and normal heart sounds.   No murmur heard.  No edema. Pulmonary/Chest: Effort normal and breath sounds normal. No respiratory distress. She has no wheezes. She has no rales.  Breast: deferred to Gyn Abdominal: Soft. She exhibits no distension. There is no tenderness.  Lymphadenopathy: She has no cervical adenopathy.  Skin: Skin is warm and dry. She is not diaphoretic.  Psychiatric: She has a normal mood and affect. Her behavior is normal.         Assessment & Plan:   Wellness Exam: Immunizations  Up to date  Colonoscopy  Up to date  Mammogram  Up to date  Dexa  Last done 2013 - has osteopenia - due, ordered Gyn Up to date  Eye exam  Up to date  Hearing loss  none Memory concerns/difficulties  none Independent of ADLs   Fully  Stressed the importance of regular exercise   Patient received copy of  preventative screening tests/immunizations recommended for the next 5-10 years.  Physical exam: Screening blood work  ordered Immunizations  Up to date  Colonoscopy  Up to date  Mammogram  Up to date  Dexa  Last done 2013 - has osteopenia - due, ordered Gyn    Up to date  Eye exams  Up to date  EKG - done 2015 - normal Exercise  regular Weight - has lost weight - working on weight loss Skin  - no concerns Substance abuse none  See Problem List for Assessment and Plan of chronic medical problems.  F/u annually

## 2016-11-06 NOTE — Assessment & Plan Note (Signed)
Check lipid panel  Continue daily statin Regular exercise and healthy diet encouraged  

## 2016-11-06 NOTE — Patient Instructions (Signed)
Hannah Bryant , Thank you for taking time to come for your Medicare Wellness Visit. I appreciate your ongoing commitment to your health goals. Please review the following plan we discussed and let me know if I can assist you in the future.   These are the goals we discussed: Goals    None      This is a list of the screening recommended for you and due dates:  Health Maintenance  Topic Date Due  .  Hepatitis C: One time screening is recommended by Center for Disease Control  (CDC) for  adults born from [birth year] through 1966.   02/25/1946  . Eye exam for diabetics  11/18/1954  . DEXA scan (bone density measurement)  11/06/2015  . Hemoglobin A1C  12/31/2016  . Colon Cancer Screening  01/17/2017  . Urine Protein Check  06/30/2017  . Complete foot exam   11/06/2017  . Mammogram  02/12/2018  . Tetanus Vaccine  10/17/2020  . Flu Shot  Completed  . Shingles Vaccine  Completed  . Pneumonia vaccines  Completed     Test(s) ordered today. Your results will be released to [facility] (or called to you) after review, usually within 72hours after test completion. If any changes need to be made, you will be notified at that same time.  All other Health Maintenance issues reviewed.   All recommended immunizations and age-appropriate screenings are up-to-date or discussed.  No immunizations administered today.   Medications reviewed and updated.  No changes recommended at this time.  Your prescription(s) have been submitted to your pharmacy. Please take as directed and contact our office if you believe you are having problem(s) with the medication(s).   Please followup in 6 months   Health Maintenance, Female Introduction Adopting a healthy lifestyle and getting preventive care can go a long way to promote health and wellness. Talk with your health care provider about what schedule of regular examinations is right for you. This is a good chance for you to check in with your provider about disease  prevention and staying healthy. In between checkups, there are plenty of things you can do on your own. Experts have done a lot of research about which lifestyle changes and preventive measures are most likely to keep you healthy. Ask your health care provider for more information. Weight and diet Eat a healthy diet  Be sure to include plenty of vegetables, fruits, low-fat dairy products, and lean protein.  Do not eat a lot of foods high in solid fats, added sugars, or salt.  Get regular exercise. This is one of the most important things you can do for your health.  Most adults should exercise for at least 150 minutes each week. The exercise should increase your heart rate and make you sweat (moderate-intensity exercise).  Most adults should also do strengthening exercises at least twice a week. This is in addition to the moderate-intensity exercise. Maintain a healthy weight  Body mass index (BMI) is a measurement that can be used to identify possible weight problems. It estimates body fat based on height and weight. Your health care provider can help determine your BMI and help you achieve or maintain a healthy weight.  For females 85 years of age and older:  A BMI below 18.5 is considered underweight.  A BMI of 18.5 to 24.9 is normal.  A BMI of 25 to 29.9 is considered overweight.  A BMI of 30 and above is considered obese. Watch levels of cholesterol and blood  lipids  You should start having your blood tested for lipids and cholesterol at 71 years of age, then have this test every 5 years.  You may need to have your cholesterol levels checked more often if:  Your lipid or cholesterol levels are high.  You are older than 71 years of age.  You are at high risk for heart disease. Cancer screening Lung Cancer  Lung cancer screening is recommended for adults 33-53 years old who are at high risk for lung cancer because of a history of smoking.  A yearly low-dose CT scan of  the lungs is recommended for people who:  Currently smoke.  Have quit within the past 15 years.  Have at least a 30-pack-year history of smoking. A pack year is smoking an average of one pack of cigarettes a day for 1 year.  Yearly screening should continue until it has been 15 years since you quit.  Yearly screening should stop if you develop a health problem that would prevent you from having lung cancer treatment. Breast Cancer  Practice breast self-awareness. This means understanding how your breasts normally appear and feel.  It also means doing regular breast self-exams. Let your health care provider know about any changes, no matter how small.  If you are in your 20s or 30s, you should have a clinical breast exam (CBE) by a health care provider every 1-3 years as part of a regular health exam.  If you are 66 or older, have a CBE every year. Also consider having a breast X-ray (mammogram) every year.  If you have a family history of breast cancer, talk to your health care provider about genetic screening.  If you are at high risk for breast cancer, talk to your health care provider about having an MRI and a mammogram every year.  Breast cancer gene (BRCA) assessment is recommended for women who have family members with BRCA-related cancers. BRCA-related cancers include:  Breast.  Ovarian.  Tubal.  Peritoneal cancers.  Results of the assessment will determine the need for genetic counseling and BRCA1 and BRCA2 testing. Cervical Cancer  Your health care provider may recommend that you be screened regularly for cancer of the pelvic organs (ovaries, uterus, and vagina). This screening involves a pelvic examination, including checking for microscopic changes to the surface of your cervix (Pap test). You may be encouraged to have this screening done every 3 years, beginning at age 26.  For women ages 37-65, health care providers may recommend pelvic exams and Pap testing every  3 years, or they may recommend the Pap and pelvic exam, combined with testing for human papilloma virus (HPV), every 5 years. Some types of HPV increase your risk of cervical cancer. Testing for HPV may also be done on women of any age with unclear Pap test results.  Other health care providers may not recommend any screening for nonpregnant women who are considered low risk for pelvic cancer and who do not have symptoms. Ask your health care provider if a screening pelvic exam is right for you.  If you have had past treatment for cervical cancer or a condition that could lead to cancer, you need Pap tests and screening for cancer for at least 20 years after your treatment. If Pap tests have been discontinued, your risk factors (such as having a new sexual partner) need to be reassessed to determine if screening should resume. Some women have medical problems that increase the chance of getting cervical cancer. In these  cases, your health care provider may recommend more frequent screening and Pap tests. Colorectal Cancer  This type of cancer can be detected and often prevented.  Routine colorectal cancer screening usually begins at 71 years of age and continues through 71 years of age.  Your health care provider may recommend screening at an earlier age if you have risk factors for colon cancer.  Your health care provider may also recommend using home test kits to check for hidden blood in the stool.  A small camera at the end of a tube can be used to examine your colon directly (sigmoidoscopy or colonoscopy). This is done to check for the earliest forms of colorectal cancer.  Routine screening usually begins at age 40.  Direct examination of the colon should be repeated every 5-10 years through 71 years of age. However, you may need to be screened more often if early forms of precancerous polyps or small growths are found. Skin Cancer  Check your skin from head to toe regularly.  Tell your  health care provider about any new moles or changes in moles, especially if there is a change in a mole's shape or color.  Also tell your health care provider if you have a mole that is larger than the size of a pencil eraser.  Always use sunscreen. Apply sunscreen liberally and repeatedly throughout the day.  Protect yourself by wearing long sleeves, pants, a wide-brimmed hat, and sunglasses whenever you are outside. Heart disease, diabetes, and high blood pressure  High blood pressure causes heart disease and increases the risk of stroke. High blood pressure is more likely to develop in:  People who have blood pressure in the high end of the normal range (130-139/85-89 mm Hg).  People who are overweight or obese.  People who are African American.  If you are 46-60 years of age, have your blood pressure checked every 3-5 years. If you are 66 years of age or older, have your blood pressure checked every year. You should have your blood pressure measured twice-once when you are at a hospital or clinic, and once when you are not at a hospital or clinic. Record the average of the two measurements. To check your blood pressure when you are not at a hospital or clinic, you can use:  An automated blood pressure machine at a pharmacy.  A home blood pressure monitor.  If you are between 44 years and 41 years old, ask your health care provider if you should take aspirin to prevent strokes.  Have regular diabetes screenings. This involves taking a blood sample to check your fasting blood sugar level.  If you are at a normal weight and have a low risk for diabetes, have this test once every three years after 71 years of age.  If you are overweight and have a high risk for diabetes, consider being tested at a younger age or more often. Preventing infection Hepatitis B  If you have a higher risk for hepatitis B, you should be screened for this virus. You are considered at high risk for hepatitis  B if:  You were born in a country where hepatitis B is common. Ask your health care provider which countries are considered high risk.  Your parents were born in a high-risk country, and you have not been immunized against hepatitis B (hepatitis B vaccine).  You have HIV or AIDS.  You use needles to inject street drugs.  You live with someone who has hepatitis B.  You have had sex with someone who has hepatitis B.  You get hemodialysis treatment.  You take certain medicines for conditions, including cancer, organ transplantation, and autoimmune conditions. Hepatitis C  Blood testing is recommended for:  Everyone born from [birth year] through 1966.  Anyone with known risk factors for hepatitis C. Sexually transmitted infections (STIs)  You should be screened for sexually transmitted infections (STIs) including gonorrhea and chlamydia if:  You are sexually active and are younger than 71 years of age.  You are older than 71 years of age and your health care provider tells you that you are at risk for this type of infection.  Your sexual activity has changed since you were last screened and you are at an increased risk for chlamydia or gonorrhea. Ask your health care provider if you are at risk.  If you do not have HIV, but are at risk, it may be recommended that you take a prescription medicine daily to prevent HIV infection. This is called pre-exposure prophylaxis (PrEP). You are considered at risk if:  You are sexually active and do not regularly use condoms or know the HIV status of your partner(s).  You take drugs by injection.  You are sexually active with a partner who has HIV. Talk with your health care provider about whether you are at high risk of being infected with HIV. If you choose to begin PrEP, you should first be tested for HIV. You should then be tested every 3 months for as long as you are taking PrEP. Pregnancy  If you are premenopausal and you may become  pregnant, ask your health care provider about preconception counseling.  If you may become pregnant, take 400 to 800 micrograms (mcg) of folic acid every day.  If you want to prevent pregnancy, talk to your health care provider about birth control (contraception). Osteoporosis and menopause  Osteoporosis is a disease in which the bones lose minerals and strength with aging. This can result in serious bone fractures. Your risk for osteoporosis can be identified using a bone density scan.  If you are 60 years of age or older, or if you are at risk for osteoporosis and fractures, ask your health care provider if you should be screened.  Ask your health care provider whether you should take a calcium or vitamin D supplement to lower your risk for osteoporosis.  Menopause may have certain physical symptoms and risks.  Hormone replacement therapy may reduce some of these symptoms and risks. Talk to your health care provider about whether hormone replacement therapy is right for you. Follow these instructions at home:  Schedule regular health, dental, and eye exams.  Stay current with your immunizations.  Do not use any tobacco products including cigarettes, chewing tobacco, or electronic cigarettes.  If you are pregnant, do not drink alcohol.  If you are breastfeeding, limit how much and how often you drink alcohol.  Limit alcohol intake to no more than 1 drink per day for nonpregnant women. One drink equals 12 ounces of beer, 5 ounces of wine, or 1 ounces of hard liquor.  Do not use street drugs.  Do not share needles.  Ask your health care provider for help if you need support or information about quitting drugs.  Tell your health care provider if you often feel depressed.  Tell your health care provider if you have ever been abused or do not feel safe at home. This information is not intended to replace advice given to you  by your health care provider. Make sure you discuss any  questions you have with your health care provider. Document Released: 05/19/2011 Document Revised: 04/10/2016 Document Reviewed: 08/07/2015  2017 Elsevier

## 2016-11-06 NOTE — Assessment & Plan Note (Signed)
GI work up negative Colonoscopy up to date Controlled with hyoscyamine - continue

## 2016-11-06 NOTE — Progress Notes (Signed)
Pre visit review using our clinic review tool, if applicable. No additional management support is needed unless otherwise documented below in the visit note. 

## 2016-11-06 NOTE — Assessment & Plan Note (Signed)
dexa due - ordered Continue calcium/vitamin D Continue regular exercise

## 2016-11-06 NOTE — Assessment & Plan Note (Signed)
BP well controlled Current regimen effective and well tolerated Continue current medications at current doses  

## 2016-11-06 NOTE — Assessment & Plan Note (Signed)
Check tsh 

## 2016-11-06 NOTE — Assessment & Plan Note (Addendum)
Taking nexium daily Still has occasional GERD at night if she eats something she shouldn't -- drinks water and baking soda Can try zantac or pepcid at night Continue weight loss efforts

## 2016-11-07 ENCOUNTER — Encounter: Payer: Self-pay | Admitting: Internal Medicine

## 2017-01-05 ENCOUNTER — Other Ambulatory Visit (INDEPENDENT_AMBULATORY_CARE_PROVIDER_SITE_OTHER): Payer: Medicare Other

## 2017-01-05 DIAGNOSIS — M8580 Other specified disorders of bone density and structure, unspecified site: Secondary | ICD-10-CM

## 2017-01-05 DIAGNOSIS — E7800 Pure hypercholesterolemia, unspecified: Secondary | ICD-10-CM | POA: Diagnosis not present

## 2017-01-05 DIAGNOSIS — R946 Abnormal results of thyroid function studies: Secondary | ICD-10-CM

## 2017-01-05 DIAGNOSIS — I10 Essential (primary) hypertension: Secondary | ICD-10-CM | POA: Diagnosis not present

## 2017-01-05 DIAGNOSIS — E11649 Type 2 diabetes mellitus with hypoglycemia without coma: Secondary | ICD-10-CM

## 2017-01-05 DIAGNOSIS — K2100 Gastro-esophageal reflux disease with esophagitis, without bleeding: Secondary | ICD-10-CM

## 2017-01-05 DIAGNOSIS — Z1159 Encounter for screening for other viral diseases: Secondary | ICD-10-CM

## 2017-01-05 LAB — CBC WITH DIFFERENTIAL/PLATELET
BASOS ABS: 0 10*3/uL (ref 0.0–0.1)
Basophils Relative: 0.4 % (ref 0.0–3.0)
EOS ABS: 0.2 10*3/uL (ref 0.0–0.7)
Eosinophils Relative: 3 % (ref 0.0–5.0)
HEMATOCRIT: 40.6 % (ref 36.0–46.0)
Hemoglobin: 13.6 g/dL (ref 12.0–15.0)
LYMPHS ABS: 2.7 10*3/uL (ref 0.7–4.0)
LYMPHS PCT: 39.5 % (ref 12.0–46.0)
MCHC: 33.6 g/dL (ref 30.0–36.0)
MCV: 95 fl (ref 78.0–100.0)
MONO ABS: 0.5 10*3/uL (ref 0.1–1.0)
Monocytes Relative: 7 % (ref 3.0–12.0)
NEUTROS ABS: 3.4 10*3/uL (ref 1.4–7.7)
NEUTROS PCT: 50.1 % (ref 43.0–77.0)
PLATELETS: 156 10*3/uL (ref 150.0–400.0)
RBC: 4.27 Mil/uL (ref 3.87–5.11)
RDW: 13.4 % (ref 11.5–15.5)
WBC: 6.9 10*3/uL (ref 4.0–10.5)

## 2017-01-05 LAB — COMPREHENSIVE METABOLIC PANEL
ALT: 15 U/L (ref 0–35)
AST: 17 U/L (ref 0–37)
Albumin: 4.1 g/dL (ref 3.5–5.2)
Alkaline Phosphatase: 58 U/L (ref 39–117)
BILIRUBIN TOTAL: 0.4 mg/dL (ref 0.2–1.2)
BUN: 14 mg/dL (ref 6–23)
CHLORIDE: 103 meq/L (ref 96–112)
CO2: 33 meq/L — AB (ref 19–32)
CREATININE: 0.67 mg/dL (ref 0.40–1.20)
Calcium: 9.4 mg/dL (ref 8.4–10.5)
GFR: 91.92 mL/min (ref >60.00)
GLUCOSE: 119 mg/dL — AB (ref 70–99)
Potassium: 3.9 mEq/L (ref 3.5–5.1)
SODIUM: 144 meq/L (ref 135–145)
Total Protein: 6.6 g/dL (ref 6.0–8.3)

## 2017-01-05 LAB — TSH: TSH: 1.02 u[IU]/mL (ref 0.35–4.50)

## 2017-01-05 LAB — HEMOGLOBIN A1C: Hgb A1c MFr Bld: 6.9 % — ABNORMAL HIGH (ref 4.6–6.5)

## 2017-01-05 LAB — LIPID PANEL
CHOL/HDL RATIO: 3
Cholesterol: 153 mg/dL (ref 0–200)
HDL: 53.1 mg/dL (ref >39.00)
LDL CALC: 68 mg/dL (ref 0–99)
NONHDL: 100.04
Triglycerides: 161 mg/dL — ABNORMAL HIGH (ref 0.0–149.0)
VLDL: 32.2 mg/dL (ref 0.0–40.0)

## 2017-01-05 LAB — HEPATITIS C ANTIBODY: HCV AB: NEGATIVE

## 2017-01-06 ENCOUNTER — Encounter: Payer: Self-pay | Admitting: Internal Medicine

## 2017-01-07 ENCOUNTER — Encounter: Payer: Self-pay | Admitting: Internal Medicine

## 2017-02-03 ENCOUNTER — Ambulatory Visit (INDEPENDENT_AMBULATORY_CARE_PROVIDER_SITE_OTHER)
Admission: RE | Admit: 2017-02-03 | Discharge: 2017-02-03 | Disposition: A | Discharge status: Home / Self Care | Payer: Medicare Other | Source: Ambulatory Visit | Attending: Internal Medicine | Admitting: Internal Medicine

## 2017-02-03 DIAGNOSIS — E2839 Other primary ovarian failure: Secondary | ICD-10-CM | POA: Diagnosis not present

## 2017-02-03 DIAGNOSIS — M8580 Other specified disorders of bone density and structure, unspecified site: Secondary | ICD-10-CM

## 2017-02-03 DIAGNOSIS — Z13820 Encounter for screening for osteoporosis: Secondary | ICD-10-CM

## 2017-02-06 ENCOUNTER — Ambulatory Visit (INDEPENDENT_AMBULATORY_CARE_PROVIDER_SITE_OTHER): Payer: Medicare Other | Admitting: Family

## 2017-02-06 ENCOUNTER — Encounter: Payer: Self-pay | Admitting: Family

## 2017-02-06 ENCOUNTER — Ambulatory Visit (INDEPENDENT_AMBULATORY_CARE_PROVIDER_SITE_OTHER)
Admission: RE | Admit: 2017-02-06 | Discharge: 2017-02-06 | Disposition: A | Discharge status: Home / Self Care | Payer: Medicare Other | Source: Ambulatory Visit | Attending: Family | Admitting: Family

## 2017-02-06 VITALS — BP 170/84 | HR 64 | Temp 98.0°F | Resp 16 | Ht 63.0 in | Wt 151.8 lb

## 2017-02-06 DIAGNOSIS — M25511 Pain in right shoulder: Secondary | ICD-10-CM | POA: Diagnosis not present

## 2017-02-06 DIAGNOSIS — S4991XA Unspecified injury of right shoulder and upper arm, initial encounter: Secondary | ICD-10-CM | POA: Diagnosis not present

## 2017-02-06 NOTE — Progress Notes (Signed)
Subjective:    Patient ID: Hannah Bryant, female    DOB: [birth year], 71 y.o.   MRN: [id number]  Chief Complaint  Patient presents with  . Fall    right arm pain, upper arm and shoulder bothering her, limited ROM    HPI:  Hannah Bryant is a 70 y.o. 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 and presents today for an acute office visit.  This is a new problem. Associated symptom of pain located in her right arm and shoulder have been going on for about 3 days following a fall where she landed on her right sided and has several abrasions and scrapes. Pain is described as sharp and only hurts when she is moving it. Modifying factors include ice and Pennsaid. Does have decreased range of motion. Denies any numbness or tingling. She has had previous bursitis located in the same shoulder a couple of years ago.   Allergies  Allergen Reactions  . Domperidone     Stomach cramps  . Fentanyl Nausea And Vomiting  . Monistat [Miconazole Nitrate]     SEVERE BURNING  . Nsaids     REACTION: stomach complaints  . Oxycodone Hcl     REACTION: VOMITTING  . Codeine     nausea  . Erythromycin Ethylsuccinate     gastritis      Outpatient Medications Prior to Visit  Medication Sig Dispense Refill  . acetaminophen (TYLENOL ARTHRITIS PAIN) 650 MG CR tablet Take 650 mg by mouth. 2 by mouth 2 times daily     . aspirin 81 MG chewable tablet Chew 81 mg by mouth daily.    Marland Kitchen atorvastatin (LIPITOR) 40 MG tablet TAKE 1 TABLET BY MOUTH DAILY 90 tablet 3  . Calcium-Vitamin D-Vitamin K (VIACTIV PO) Take by mouth. Reported on 02/28/2016    . esomeprazole (NEXIUM) 40 MG  capsule Take 1 capsule (40 mg total) by mouth daily. --- Needs office visit before further refills   30 capsule 2  . fexofenadine (ALLEGRA) 180 MG tablet Take 180 mg by mouth daily.      Marland Kitchen glucose blood test strip Use 7-10 x a day - Verio 900 each 1  . hydrochlorothiazide (MICROZIDE) 12.5 MG capsule Take 1 capsule (12.5 mg total) by mouth daily. 90 capsule 3  . hyoscyamine (LEVSIN SL) 0.125 MG SL tablet DISSOLVE ONE OR TWO TABLETS UNDER TONGUE EVERY FOUR TO SIX HOURS AS NEEDED for abdominal pain and cramping. 360 tablet 3  . Lancets MISC by Does not apply route. Lifespan ultra 2 test strips and lancets     . montelukast (SINGULAIR) 10 MG tablet 1 qd prn 90 tablet 3  . OVER THE COUNTER MEDICATION multistrain probiotic    . PENNSAID 2 % SOLN APPLY TWICE DAILY (Patient taking differently: APPLY TWICE DAILY PRN) 112 g 1   No facility-administered medications prior to visit.       Past Surgical History:  Procedure Laterality Date  . CARDIAC CATHETERIZATION  2005   < 30 % lesion  . COLONOSCOPY W/ POLYPECTOMY  2006 ,2007 & 2015   negative 2009; Hannah Bryant  . DILATATION & CURETTAGE/HYSTEROSCOPY WITH TRUECLEAR N/A 01/11/2014   Procedure: DILATATION & CURETTAGE/HYSTEROSCOPY WITH TRUCLEAR;  Surgeon: Hannah Auerbach, MD;  Location: [facility]  ORS;  Service: Gynecology;  Laterality: N/A;  . ESOPHAGEAL MANOMETRY  03/22/15   Decreased peristalsis  . Esophagram  03/29/15   Persistent narrowing at site of hiatal hernia; proximal dilation  . G 3 P 3    . HIATAL HERNIA REPAIR  2000  . LEG SURGERY     Trauma LLE/pins and plates  . myomectomy  1999   uterine fibroid  . TUBAL LIGATION    . Upper endoscopy and esophageal dilation  02/20/15   Hannah Hannah Bryant , [facility]      Past Medical History:  Diagnosis Date  . Abnormal finding on EKG    non specific T changes; short PR  . Arthritis   . Bursitis    tronchanteric  . Complication of anesthesia    Severe PONV  . Diabetes mellitus without complication ([place])    Pt  states she is pre-diabetic (12-06-13)  . Diverticulosis   . Gastroparesis    93 % [place] at 2 hrs  . GERD (gastroesophageal reflux disease)   . H/O dilation and curettage   . Hernia    hiatal  . Hiatal hernia   . Hyperlipidemia   . Hyperlipidemia   . hypoglycemia   . Tubulovillous adenoma polyp of colon 06/2005   Hyperplastic 2007      Review of Systems  Constitutional: Negative for chills and fever.  Musculoskeletal:       Positive for right arm pain.   Neurological: Negative for weakness and numbness.  Hematological: Does not bruise/bleed easily.      Objective:    BP (!) 170/84 (BP Location: Left Arm, Patient Position: Sitting, Cuff Size: Normal)   Pulse 64   Temp 98 F (36.7 C) (Oral)   Resp 16   Ht 5\' 3"  (1.6 m)   Wt 151 lb 12.8 oz (68.9 kg)   LMP 11/18/2000   SpO2 98%   BMI 26.89 kg/m  Nursing note and vital signs reviewed.  Physical Exam  Constitutional: She is oriented to person, place, and time. She appears well-developed and well-nourished. No distress.  Cardiovascular: Normal rate, regular rhythm, normal heart sounds and intact distal pulses.   Pulmonary/Chest: Effort normal and breath sounds normal.  Musculoskeletal:  Right shoulder/upper arm - no obvious deformity, discoloration, or edema. No palpable tenderness able to be elicited. Range of motion with discomfort noted in flexion and abduction. Distal pulses and sensation are intact and appropriate.  Neurological: She is alert and oriented to person, place, and time.  Skin: Skin is warm and dry.  Psychiatric: She has a normal mood and affect. Her behavior is normal. Judgment and thought content normal.       Assessment & Bryant:   Problem List Items Addressed This Visit      Other   Acute pain of right shoulder - Primary    New-onset acute right shoulder pain/upper arm pain following fall most likely contusion with concern for possible fracture. Obtain x-ray. Treat conservatively with ice,  Tylenol, and home exercise therapy. Follow-up if symptoms worsen or do not improve.      Relevant Orders   DG Shoulder Right (Completed)       I am having Hannah Bryant maintain her fexofenadine, acetaminophen, Lancets, Calcium-Vitamin D-Vitamin K (VIACTIV PO), OVER THE COUNTER MEDICATION, aspirin, PENNSAID, esomeprazole, glucose blood, atorvastatin, hydrochlorothiazide, hyoscyamine, and montelukast.   Follow-up: Return in about 1 month (around 03/09/2017), or if symptoms worsen or fail to improve.  Mauricio Po, FNP

## 2017-02-06 NOTE — Assessment & Plan Note (Signed)
New-onset acute right shoulder pain/upper arm pain following fall most likely contusion with concern for possible fracture. Obtain x-ray. Treat conservatively with ice, Tylenol, and home exercise therapy. Follow-up if symptoms worsen or do not improve.

## 2017-02-06 NOTE — Patient Instructions (Signed)
Thank you for choosing [facility].  SUMMARY AND INSTRUCTIONS:  Ice x 20 minutes every 2 hours and after activity.  Stretches and exercises starting in 24-48 hours.  Pennsaid 2x per day.  Continue with acetaminophen.  Imaging / Radiology:  Please stop by radiology on the basement level of the building for your x-rays. Your results will be released to [place] (or called to you) after review, usually within 72 hours after test completion. If any treatments or changes are necessary, you will be notified at that same time.  Follow up:  If your symptoms worsen or fail to improve, please contact our office for further instruction, or in case of emergency go directly to the emergency room at the closest medical facility.    Shoulder Exercises Ask your health care provider which exercises are safe for you. Do exercises exactly as told by your health care provider and adjust them as directed. It is normal to feel mild stretching, pulling, tightness, or discomfort as you do these exercises, but you should stop right away if you feel sudden pain or your pain gets worse.Do not begin these exercises until told by your health care provider. RANGE OF MOTION EXERCISES  These exercises warm up your muscles and joints and improve the movement and flexibility of your shoulder. These exercises also help to relieve pain, numbness, and tingling. These exercises involve stretching your injured shoulder directly. Exercise A: Pendulum   1. Stand near a wall or a surface that you can hold onto for balance. 2. Bend at the waist and let your left / right arm hang straight down. Use your other arm to support you. Keep your back straight and do not lock your knees. 3. Relax your left / right arm and shoulder muscles, and move your hips and your trunk so your left / right arm swings freely. Your arm should swing because of the motion of your body, not because you are using your arm or shoulder  muscles. 4. Keep moving your body so your arm swings in the following directions, as told by your health care provider:  Side to side.  Forward and backward.  In clockwise and counterclockwise circles. 5. Continue each motion for __________ seconds, or for as long as told by your health care provider. 6. Slowly return to the starting position. Repeat __________ times. Complete this exercise __________ times a day. Exercise B:Flexion, Standing   1. Stand and hold a broomstick, a cane, or a similar object. Place your hands a little more than shoulder-width apart on the object. Your left / right hand should be palm-up, and your other hand should be palm-down. 2. Keep your elbow straight and keep your shoulder muscles relaxed. Push the stick down with your healthy arm to raise your left / right arm in front of your body, and then over your head until you feel a stretch in your shoulder.  Avoid shrugging your shoulder while you raise your arm. Keep your shoulder blade tucked down toward the middle of your back. 3. Hold for __________ seconds. 4. Slowly return to the starting position. Repeat __________ times. Complete this exercise __________ times a day. Exercise C: Abduction, Standing  1. Stand and hold a broomstick, a cane, or a similar object. Place your hands a little more than shoulder-width apart on the object. Your left / right hand should be palm-up, and your other hand should be palm-down. 2. While keeping your elbow straight and your shoulder muscles relaxed, push the stick across  your body toward your left / right side. Raise your left / right arm to the side of your body and then over your head until you feel a stretch in your shoulder.  Do not raise your arm above shoulder height, unless your health care provider tells you to do that.  Avoid shrugging your shoulder while you raise your arm. Keep your shoulder blade tucked down toward the middle of your back. 3. Hold for  __________ seconds. 4. Slowly return to the starting position. Repeat __________ times. Complete this exercise __________ times a day. Exercise D:Internal Rotation   1. Place your left / right hand behind your back, palm-up. 2. Use your other hand to dangle an exercise band, a towel, or a similar object over your shoulder. Grasp the band with your left / right hand so you are holding onto both ends. 3. Gently pull up on the band until you feel a stretch in the front of your left / right shoulder.  Avoid shrugging your shoulder while you raise your arm. Keep your shoulder blade tucked down toward the middle of your back. 4. Hold for __________ seconds. 5. Release the stretch by letting go of the band and lowering your hands. Repeat __________ times. Complete this exercise __________ times a day. STRETCHING EXERCISES  These exercises warm up your muscles and joints and improve the movement and flexibility of your shoulder. These exercises also help to relieve pain, numbness, and tingling. These exercises are done using your healthy shoulder to help stretch the muscles of your injured shoulder. Exercise E: Warehouse manager (External Rotation and Abduction)   1. Stand in a doorway with one of your feet slightly in front of the other. This is called a staggered stance. If you cannot reach your forearms to the door frame, stand facing a corner of a room. 2. Choose one of the following positions as told by your health care provider:  Place your hands and forearms on the door frame above your head.  Place your hands and forearms on the door frame at the height of your head.  Place your hands on the door frame at the height of your elbows. 3. Slowly move your weight onto your front foot until you feel a stretch across your chest and in the front of your shoulders. Keep your head and chest upright and keep your abdominal muscles tight. 4. Hold for __________ seconds. 5. To release the stretch, shift  your weight to your back foot. Repeat __________ times. Complete this stretch __________ times a day. Exercise F:Extension, Standing  1. Stand and hold a broomstick, a cane, or a similar object behind your back.  Your hands should be a little wider than shoulder-width apart.  Your palms should face away from your back. 2. Keeping your elbows straight and keeping your shoulder muscles relaxed, move the stick away from your body until you feel a stretch in your shoulder.  Avoid shrugging your shoulders while you move the stick. Keep your shoulder blade tucked down toward the middle of your back. 3. Hold for __________ seconds. 4. Slowly return to the starting position. Repeat __________ times. Complete this exercise __________ times a day. STRENGTHENING EXERCISES  These exercises build strength and endurance in your shoulder. Endurance is the ability to use your muscles for a long time, even after they get tired. Exercise G:External Rotation   1. Sit in a stable chair without armrests. 2. Secure an exercise band at elbow height on your left /  right side. 3. Place a soft object, such as a folded towel or a small pillow, between your left / right upper arm and your body to move your elbow a few inches away (about 10 cm) from your side. 4. Hold the end of the band so it is tight and there is no slack. 5. Keeping your elbow pressed against the soft object, move your left / right forearm out, away from your abdomen. Keep your body steady so only your forearm moves. 6. Hold for __________ seconds. 7. Slowly return to the starting position. Repeat __________ times. Complete this exercise __________ times a day. Exercise H:Shoulder Abduction   1. Sit in a stable chair without armrests, or stand. 2. Hold a __________ weight in your left / right hand, or hold an exercise band with both hands. 3. Start with your arms straight down and your left / right palm facing in, toward your body. 4. Slowly  lift your left / right hand out to your side. Do not lift your hand above shoulder height unless your health care provider tells you that this is safe.  Keep your arms straight.  Avoid shrugging your shoulder while you do this movement. Keep your shoulder blade tucked down toward the middle of your back. 5. Hold for __________ seconds. 6. Slowly lower your arm, and return to the starting position. Repeat __________ times. Complete this exercise __________ times a day. Exercise I:Shoulder Extension  1. Sit in a stable chair without armrests, or stand. 2. Secure an exercise band to a stable object in front of you where it is at shoulder height. 3. Hold one end of the exercise band in each hand. Your palms should face each other. 4. Straighten your elbows and lift your hands up to shoulder height. 5. Step back, away from the secured end of the exercise band, until the band is tight and there is no slack. 6. Squeeze your shoulder blades together as you pull your hands down to the sides of your thighs. Stop when your hands are straight down by your sides. Do not let your hands go behind your body. 7. Hold for __________ seconds. 8. Slowly return to the starting position. Repeat __________ times. Complete this exercise __________ times a day. Exercise J:Standing Shoulder Row  1. Sit in a stable chair without armrests, or stand. 2. Secure an exercise band to a stable object in front of you so it is at waist height. 3. Hold one end of the exercise band in each hand. Your palms should be in a thumbs-up position. 4. Bend each of your elbows to an "L" shape (about 90 degrees) and keep your upper arms at your sides. 5. Step back until the band is tight and there is no slack. 6. Slowly pull your elbows back behind you. 7. Hold for __________ seconds. 8. Slowly return to the starting position. Repeat __________ times. Complete this exercise __________ times a day. Exercise K:Shoulder Press-Ups    1. Sit in a stable chair that has armrests. Sit upright, with your feet flat on the floor. 2. Put your hands on the armrests so your elbows are bent and your fingers are pointing forward. Your hands should be about even with the sides of your body. 3. Push down on the armrests and use your arms to lift yourself off of the chair. Straighten your elbows and lift yourself up as much as you comfortably can.  Move your shoulder blades down, and avoid letting your shoulders move  up toward your ears.  Keep your feet on the ground. As you get stronger, your feet should support less of your body weight as you lift yourself up. 4. Hold for __________ seconds. 5. Slowly lower yourself back into the chair. Repeat __________ times. Complete this exercise __________ times a day. Exercise L: Wall Push-Ups   1. Stand so you are facing a stable wall. Your feet should be about one arm-length away from the wall. 2. Lean forward and place your palms on the wall at shoulder height. 3. Keep your feet flat on the floor as you bend your elbows and lean forward toward the wall. 4. Hold for __________ seconds. 5. Straighten your elbows to push yourself back to the starting position. Repeat __________ times. Complete this exercise __________ times a day. This information is not intended to replace advice given to you by your health care provider. Make sure you discuss any questions you have with your health care provider. Document Released: 09/17/2005 Document Revised: 07/28/2016 Document Reviewed: 07/15/2015 Elsevier Interactive Patient Education  2017 [employer].

## 2017-02-08 ENCOUNTER — Encounter: Payer: Self-pay | Admitting: Family

## 2017-02-12 ENCOUNTER — Encounter: Payer: Self-pay | Admitting: Internal Medicine

## 2017-03-03 NOTE — Progress Notes (Signed)
Subjective:    Patient ID: Hannah Bryant, female    DOB: 1946/10/22, 71 y.o.   MRN: [id number]  HPI She is here for follow up of her bone density test    She is taking calcium and vitamin D daily.  She is exercising regularly - walks and works with a trainer once a week. She has GERD, gastroparesis and dysphagia. She takes nexium daily.   Dexa 02/03/17 Results:  Lumbar spine (L1-L4) Femoral neck (FN) 33% distal radius  T-score 0.6 RFN:-2.0 LFN:-1.6 n/a  Change in BMD from previous DXA test (%) Down 2.0% Down 4.3% n/a  (*) statistically significant  Assessment: the BMD is low according to the [facility] classification for osteoporosis (see below). Fracture risk: moderate FRAX score: 10 year major osteoporotic risk: 19.1%. 10 year hip fracture risk: 4.0%. These are not under the thresholds for treatment of 20% and 3%, respectively.  She will be going on a cruise in a few months and would like to try meclizine. She took scopolamine in the past and it did not help.    Medications and allergies reviewed with patient and updated if appropriate.  Patient Active Problem List   Diagnosis Date Noted  . Acute pain of right shoulder 02/06/2017  . Left sided abdominal pain 03/25/2016  . Type 2 diabetes mellitus with hypoglycemia without coma, without long-term current use of insulin ([place]) 02/28/2016  . Reactive hypoglycemia 02/28/2016  . Retrocalcaneal bursitis 02/26/2016  . Osteopenia 01/09/2016  . Essential hypertension, benign 01/09/2016  . Achilles tendinosis 06/22/2015  . Junctional nevus of left thigh 12/23/2014  . Recurrent abdominal pain 11/28/2014  . Left rotator cuff tear arthropathy 08/21/2014  . Squamous cell skin cancer 10/06/2013  . BENIGN POSITIONAL VERTIGO 08/27/2009  . NONSPECIFIC ABNORM RESULTS THYROID FUNCT STUDY 07/13/2009  . History of colonic polyps 05/09/2008  . Gastroparesis 11/15/2007  . DIVERTICULOSIS, COLON 11/15/2007  . GASTROESOPHAGEAL REFLUX DISEASE,  SEVERE 05/12/2007  . Dysphagia 05/12/2007  . Nonspecific abnormal electrocardiogram (ECG) (EKG) 05/12/2007  . Hyperlipidemia 05/07/2007    Current Outpatient Prescriptions on File Prior to Visit  Medication Sig Dispense Refill  . acetaminophen (TYLENOL ARTHRITIS PAIN) 650 MG CR tablet Take 650 mg by mouth. 2 by mouth 2 times daily     . aspirin 81 MG chewable tablet Chew 81 mg by mouth daily.    Marland Kitchen atorvastatin (LIPITOR) 40 MG tablet TAKE 1 TABLET BY MOUTH DAILY 90 tablet 3  . Calcium-Vitamin D-Vitamin K (VIACTIV PO) Take by mouth. Reported on 02/28/2016    . esomeprazole (NEXIUM) 40 MG capsule Take 1 capsule (40 mg total) by mouth daily. --- Needs office visit before further refills   30 capsule 2  . fexofenadine (ALLEGRA) 180 MG tablet Take 180 mg by mouth daily.      Marland Kitchen glucose blood test strip Use 7-10 x a day - Verio 900 each 1  . hydrochlorothiazide (MICROZIDE) 12.5 MG capsule Take 1 capsule (12.5 mg total) by mouth daily. 90 capsule 3  . hyoscyamine (LEVSIN SL) 0.125 MG SL tablet DISSOLVE ONE OR TWO TABLETS UNDER TONGUE EVERY FOUR TO SIX HOURS AS NEEDED for abdominal pain and cramping. 360 tablet 3  . Lancets MISC by Does not apply route. Lifespan ultra 2 test strips and lancets     . montelukast (SINGULAIR) 10 MG tablet 1 qd prn 90 tablet 3  . OVER THE COUNTER MEDICATION multistrain probiotic    . PENNSAID 2 % SOLN APPLY TWICE DAILY (Patient taking  differently: APPLY TWICE DAILY PRN) 112 g 1   No current facility-administered medications on file prior to visit.     Past Medical History:  Diagnosis Date  . Abnormal finding on EKG    non specific T changes; short PR  . Arthritis   . Bursitis    tronchanteric  . Complication of anesthesia    Severe PONV  . Diabetes mellitus without complication ([place])    Pt states she is pre-diabetic (12-06-13)  . Diverticulosis   . Gastroparesis    93 % [place] at 2 hrs  . GERD (gastroesophageal reflux disease)   . H/O dilation and  curettage   . Hernia    hiatal  . Hiatal hernia   . Hyperlipidemia   . Hyperlipidemia   . hypoglycemia   . 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 CARDIAC CATHETERIZATION  2005   < 30 % lesion  . COLONOSCOPY W/ POLYPECTOMY  2006 ,2007 & 2015   negative 2009; Dr Fuller Plan  . DILATATION & CURETTAGE/HYSTEROSCOPY WITH TRUECLEAR N/A 01/11/2014   Procedure: DILATATION & CURETTAGE/HYSTEROSCOPY WITH TRUCLEAR;  Surgeon: Anastasio Auerbach, MD;  Location: [facility];  Service: Gynecology;  Laterality: N/A;  . ESOPHAGEAL MANOMETRY  03/22/15   Decreased peristalsis  . Esophagram  03/29/15   Persistent narrowing at site of hiatal hernia; proximal dilation  . G 3 P 3    . HIATAL HERNIA REPAIR  2000  . LEG SURGERY     Trauma LLE/pins and plates  . myomectomy  1999   uterine fibroid  . TUBAL LIGATION    . Upper endoscopy and esophageal dilation  02/20/15   Dr Roney Mans , [facility]    Social History   Social History  . Marital status: Married    Spouse name: N/A  . Number of children: 2  . Years of education: N/A   Occupational History  .  Unemployed   Social History Main Topics  . Smoking status: Former Smoker    Types: Cigarettes    Quit date: 11/17/1980  . Smokeless tobacco: Never Used     Comment: smoked 1962-1982, up to 1 ppd  . Alcohol use 1.2 oz/week    2 Glasses of wine per week     Comment: socially,2 / week  . Drug use: No  . Sexual activity: Yes    Birth control/ protection: Post-menopausal   Other Topics Concern  . Not on file   Social History Narrative   Daily caffeine use   Married   Veterinary surgeon intermittently    Family History  Problem Relation Age of Onset  . Ulcers Father   . Diabetes Father   . Hypertension Father   . Heart attack Father     > 24  . Stroke Father     in 67s  . Heart attack Mother 14  . Diabetes Sister   . Heart attack Sister 71    CABG X 4  . Cancer  Neg Hx   . Colitis Neg Hx     Review of Systems     Objective:   Vitals:   03/04/17 1048  BP: (!) 148/86  Pulse: 66  Resp: 16  Temp: 98.2 F (36.8 C)   Filed Weights   03/04/17 1048  Weight: 151 lb (68.5 kg)   Body mass index is 26.75 kg/m.  Wt Readings from Last 3 Encounters:  03/04/17 151  lb (68.5 kg)  02/06/17 151 lb 12.8 oz (68.9 kg)  11/06/16 159 lb (72.1 kg)     Physical Exam        Assessment & Plan:   25 minutes were spent face-to-face with the patient, over 50% of which was spent counseling regarding her diagnosis of severe osteopenia with high frax  = osteoporosis and that it warrants treatment.  Reviewed causes and discussed all treatment options.  Discussed lifestyle - exercise, cal/vitamin d.        See Problem List for Assessment and Plan of chronic medical problems.

## 2017-03-04 ENCOUNTER — Encounter: Payer: Self-pay | Admitting: Family Medicine

## 2017-03-04 ENCOUNTER — Ambulatory Visit: Payer: Self-pay | Admitting: Family Medicine

## 2017-03-04 ENCOUNTER — Ambulatory Visit (INDEPENDENT_AMBULATORY_CARE_PROVIDER_SITE_OTHER): Payer: Medicare Other | Admitting: Internal Medicine

## 2017-03-04 ENCOUNTER — Other Ambulatory Visit (INDEPENDENT_AMBULATORY_CARE_PROVIDER_SITE_OTHER): Payer: Medicare Other

## 2017-03-04 ENCOUNTER — Telehealth: Payer: Self-pay | Admitting: Family Medicine

## 2017-03-04 ENCOUNTER — Ambulatory Visit: Payer: Self-pay

## 2017-03-04 ENCOUNTER — Ambulatory Visit (INDEPENDENT_AMBULATORY_CARE_PROVIDER_SITE_OTHER): Payer: Medicare Other | Admitting: Family Medicine

## 2017-03-04 ENCOUNTER — Encounter: Payer: Self-pay | Admitting: Internal Medicine

## 2017-03-04 VITALS — BP 148/86 | HR 66 | Temp 98.2°F | Resp 16 | Ht 63.0 in | Wt 151.0 lb

## 2017-03-04 DIAGNOSIS — M85852 Other specified disorders of bone density and structure, left thigh: Secondary | ICD-10-CM

## 2017-03-04 DIAGNOSIS — M85851 Other specified disorders of bone density and structure, right thigh: Secondary | ICD-10-CM

## 2017-03-04 DIAGNOSIS — M25511 Pain in right shoulder: Secondary | ICD-10-CM

## 2017-03-04 DIAGNOSIS — M25819 Other specified joint disorders, unspecified shoulder: Secondary | ICD-10-CM | POA: Diagnosis not present

## 2017-03-04 LAB — VITAMIN D 25 HYDROXY (VIT D DEFICIENCY, FRACTURES): VITD: 28.68 ng/mL — ABNORMAL LOW (ref 30.00–100.00)

## 2017-03-04 MED ORDER — MECLIZINE HCL 12.5 MG PO TABS: 12.5000 mg | ORAL_TABLET | Freq: Three times a day (TID) | ORAL | 0 refills | Status: DC | PRN

## 2017-03-04 MED ORDER — PROMETHAZINE HCL 25 MG PO TABS: 25.0000 mg | ORAL_TABLET | Freq: Three times a day (TID) | ORAL | 0 refills | Status: DC | PRN

## 2017-03-04 MED ORDER — VITAMIN D (ERGOCALCIFEROL) 1.25 MG (50000 UNIT) PO CAPS: 50000.0000 [IU] | ORAL_CAPSULE | ORAL | 0 refills | Status: DC

## 2017-03-04 NOTE — Telephone Encounter (Signed)
Noted  

## 2017-03-04 NOTE — Telephone Encounter (Signed)
FYI.  While patient was checking out with Aldona Bar, I heard patient inform Aldona Bar that she needed to sit down for a few minutes before she past out.  I went around to check on patient asking her what was going on and if I could get someone to triage her.  Patient refused triage and told me that she was fine that she just had a long day.  I went to get Paula Papandrea anyway for triage but patient had left already.

## 2017-03-04 NOTE — Assessment & Plan Note (Signed)
Patient did have what appeared to be a large cyst. Possible labral labral. Patient does have significant amount of pain. On ultrasound it appears the patient does have a small fracture of the glenohumeral joint as well. Once weekly vitamin D given, discussed icing regimen. We discussed possible sling. Patient will try to increase activity as tolerated. Follow-up again in 3 weeks. Worsening symptoms advance imaging may be warranted to rule out a deep rotator cuff tear or a labral tear. We'll discuss at follow-up.  Patient was having most likely a vasovagal after the injection. Attempted outpatient weight here but patient declined. Patient left and is continuing conservative care she states. Patient knows if worsening symptoms to seek medical attention immediately. Patient declined any repeat x-rays today as well.

## 2017-03-04 NOTE — Progress Notes (Signed)
Pre visit review using our clinic review tool, if applicable. No additional management support is needed unless otherwise documented below in the visit note. 

## 2017-03-04 NOTE — Patient Instructions (Signed)
Denosumab injection (Prolia) What is this medicine? DENOSUMAB (den oh sue mab) slows bone breakdown. Prolia is used to treat osteoporosis in women after menopause and in men. Delton See is used to treat a high calcium level due to cancer and to prevent bone fractures and other bone problems caused by multiple myeloma or cancer bone metastases. Delton See is also used to treat giant cell tumor of the bone. This medicine may be used for other purposes; ask your health care provider or pharmacist if you have questions. COMMON BRAND NAME(S): Prolia, XGEVA What should I tell my health care provider before I take this medicine? They need to know if you have any of these conditions: -dental disease -having surgery or tooth extraction -infection -kidney disease -low levels of calcium or Vitamin D in the blood -malnutrition -on hemodialysis -skin conditions or sensitivity -thyroid or parathyroid disease -an unusual reaction to denosumab, other medicines, foods, dyes, or preservatives -pregnant or trying to get pregnant -breast-feeding How should I use this medicine? This medicine is for injection under the skin. It is given by a health care professional in a hospital or clinic setting. If you are getting Prolia, a special MedGuide will be given to you by the pharmacist with each prescription and refill. Be sure to read this information carefully each time. For Prolia, talk to your pediatrician regarding the use of this medicine in children. Special care may be needed. For Delton See, talk to your pediatrician regarding the use of this medicine in children. While this drug may be prescribed for children as young as 13 years for selected conditions, precautions do apply. Overdosage: If you think you have taken too much of this medicine contact a poison control center or emergency room at once. NOTE: This medicine is only for you. Do not share this medicine with others. What if I miss a dose? It is important not  to miss your dose. Call your doctor or health care professional if you are unable to keep an appointment. What may interact with this medicine? Do not take this medicine with any of the following medications: -other medicines containing denosumab This medicine may also interact with the following medications: -medicines that lower your chance of fighting infection -steroid medicines like prednisone or cortisone This list may not describe all possible interactions. Give your health care provider a list of all the medicines, herbs, non-prescription drugs, or dietary supplements you use. Also tell them if you smoke, drink alcohol, or use illegal drugs. Some items may interact with your medicine. What should I watch for while using this medicine? Visit your doctor or health care professional for regular checks on your progress. Your doctor or health care professional may order blood tests and other tests to see how you are doing. Call your doctor or health care professional for advice if you get a fever, chills or sore throat, or other symptoms of a cold or flu. Do not treat yourself. This drug may decrease your body's ability to fight infection. Try to avoid being around people who are sick. You should make sure you get enough calcium and vitamin D while you are taking this medicine, unless your doctor tells you not to. Discuss the foods you eat and the vitamins you take with your health care professional. See your dentist regularly. Brush and floss your teeth as directed. Before you have any dental work done, tell your dentist you are receiving this medicine. Do not become pregnant while taking this medicine or for 5 months  after stopping it. Talk with your doctor or health care professional about your birth control options while taking this medicine. Women should inform their doctor if they wish to become pregnant or think they might be pregnant. There is a potential for serious side effects to an unborn  child. Talk to your health care professional or pharmacist for more information. What side effects may I notice from receiving this medicine? Side effects that you should report to your doctor or health care professional as soon as possible: -allergic reactions like skin rash, itching or hives, swelling of the face, lips, or tongue -bone pain -breathing problems -dizziness -jaw pain, especially after dental work -redness, blistering, peeling of the skin -signs and symptoms of infection like fever or chills; cough; sore throat; pain or trouble passing urine -signs of low calcium like fast heartbeat, muscle cramps or muscle pain; pain, tingling, numbness in the hands or feet; seizures -unusual bleeding or bruising -unusually weak or tired Side effects that usually do not require medical attention (report to your doctor or health care professional if they continue or are bothersome): -constipation -diarrhea -headache -joint pain -loss of appetite -muscle pain -runny nose -tiredness -upset stomach This list may not describe all possible side effects. Call your doctor for medical advice about side effects. You may report side effects to FDA at 1-800-FDA-1088. Where should I keep my medicine? This medicine is only given in a clinic, doctor's office, or other health care setting and will not be stored at home. NOTE: This sheet is a summary. It may not cover all possible information. If you have questions about this medicine, talk to your doctor, pharmacist, or health care provider.  2018 Elsevier/Gold Standard (2016-11-25 19:17:21)    Zoledronic Acid injection (Reclast) What is this medicine? ZOLEDRONIC ACID (ZOE le dron ik AS id) lowers the amount of calcium loss from bone. It is used to treat Paget's disease and osteoporosis in women. This medicine may be used for other purposes; ask your health care provider or pharmacist if you have questions. COMMON BRAND NAME(S): Reclast,  Zometa What should I tell my health care provider before I take this medicine? They need to know if you have any of these conditions: -aspirin-sensitive asthma -cancer, especially if you are receiving medicines used to treat cancer -dental disease or wear dentures -infection -kidney disease -low levels of calcium in the blood -past surgery on the parathyroid gland or intestines -receiving corticosteroids like dexamethasone or prednisone -an unusual or allergic reaction to zoledronic acid, other medicines, foods, dyes, or preservatives -pregnant or trying to get pregnant -breast-feeding How should I use this medicine? This medicine is for infusion into a vein. It is given by a health care professional in a hospital or clinic setting. Talk to your pediatrician regarding the use of this medicine in children. This medicine is not approved for use in children. Overdosage: If you think you have taken too much of this medicine contact a poison control center or emergency room at once. NOTE: This medicine is only for you. Do not share this medicine with others. What if I miss a dose? It is important not to miss your dose. Call your doctor or health care professional if you are unable to keep an appointment. What may interact with this medicine? -certain antibiotics given by injection -NSAIDs, medicines for pain and inflammation, like ibuprofen or naproxen -some diuretics like bumetanide, furosemide -teriparatide This list may not describe all possible interactions. Give your health care provider a list  of all the medicines, herbs, non-prescription drugs, or dietary supplements you use. Also tell them if you smoke, drink alcohol, or use illegal drugs. Some items may interact with your medicine. What should I watch for while using this medicine? Visit your doctor or health care professional for regular checkups. It may be some time before you see the benefit from this medicine. Do not stop taking  your medicine unless your doctor tells you to. Your doctor may order blood tests or other tests to see how you are doing. Women should inform their doctor if they wish to become pregnant or think they might be pregnant. There is a potential for serious side effects to an unborn child. Talk to your health care professional or pharmacist for more information. You should make sure that you get enough calcium and vitamin D while you are taking this medicine. Discuss the foods you eat and the vitamins you take with your health care professional. Some people who take this medicine have severe bone, joint, and/or muscle pain. This medicine may also increase your risk for jaw problems or a broken thigh bone. Tell your doctor right away if you have severe pain in your jaw, bones, joints, or muscles. Tell your doctor if you have any pain that does not go away or that gets worse. Tell your dentist and dental surgeon that you are taking this medicine. You should not have major dental surgery while on this medicine. See your dentist to have a dental exam and fix any dental problems before starting this medicine. Take good care of your teeth while on this medicine. Make sure you see your dentist for regular follow-up appointments. What side effects may I notice from receiving this medicine? Side effects that you should report to your doctor or health care professional as soon as possible: -allergic reactions like skin rash, itching or hives, swelling of the face, lips, or tongue -anxiety, confusion, or depression -breathing problems -changes in vision -eye pain -feeling faint or lightheaded, falls -jaw pain, especially after dental work -mouth sores -muscle cramps, stiffness, or weakness -redness, blistering, peeling or loosening of the skin, including inside the mouth -trouble passing urine or change in the amount of urine Side effects that usually do not require medical attention (report to your doctor or  health care professional if they continue or are bothersome): -bone, joint, or muscle pain -constipation -diarrhea -fever -hair loss -irritation at site where injected -loss of appetite -nausea, vomiting -stomach upset -trouble sleeping -trouble swallowing -weak or tired This list may not describe all possible side effects. Call your doctor for medical advice about side effects. You may report side effects to FDA at 1-800-FDA-1088. Where should I keep my medicine? This drug is given in a hospital or clinic and will not be stored at home. NOTE: This sheet is a summary. It may not cover all possible information. If you have questions about this medicine, talk to your doctor, pharmacist, or health care provider.  2018 Elsevier/Gold Standard (2014-04-01 14:19:57)    Teriparatide injection (Forteo) What is this medicine? TERIPARATIDE (terr ih PAR a tyd) increases bone mass and strength. It helps make healthy bone and to slow bone loss. This medicine is used to prevent bone fractures. This medicine may be used for other purposes; ask your health care provider or pharmacist if you have questions. COMMON BRAND NAME(S): Forteo What should I tell my health care provider before I take this medicine? They need to know if you have any of  these conditions: -bone disease other than osteoporosis -high levels of calcium in the blood -history of cancer in the bone -kidney stone -Paget's disease -parathyroid disease -receiving radiation therapy -an unusual or allergic reaction to teriparatide, other medicines, foods, dyes, or preservatives -pregnant or trying to get pregnant -breast-feeding How should I use this medicine? This medicine is for injection under the skin. You will be taught how to prepare and give this medicine. Use exactly as directed. Take your medicine at regular intervals. Do not take your medicine more often than directed. It is important that you put your used needles and  pens in a special sharps container. Do not put them in a trash can. If you do not have a sharps container, call your pharmacist or health care provider to get one. A special MedGuide will be given to you by the pharmacist with each prescription and refill. Be sure to read this information carefully each time. Talk to your pediatrician regarding the use of this medicine in children. Special care may be needed. Overdosage: If you think you have taken too much of this medicine contact a poison control center or emergency room at once. NOTE: This medicine is only for you. Do not share this medicine with others. What if I miss a dose? If you miss a dose, take it as soon as you can. If it is almost time for your next dose, take only that dose. Do not take double or extra doses. What may interact with this medicine? -digoxin This list may not describe all possible interactions. Give your health care provider a list of all the medicines, herbs, non-prescription drugs, or dietary supplements you use. Also tell them if you smoke, drink alcohol, or use illegal drugs. Some items may interact with your medicine. What should I watch for while using this medicine? Visit your doctor or health care professional for regular checks on your progress. Your doctor may order blood tests and other tests to see how you are doing. You should make sure you get enough calcium and vitamin D while you are taking this medicine, unless your doctor tells you not to. Discuss the foods you eat and the vitamins you take with your health care professional. Dennis Bast may get drowsy or dizzy. Do not drive, use machinery, or do anything that needs mental alertness until you know how this medicine affects you. Do not stand or sit up quickly, especially if you are an older patient. This reduces the risk of dizzy or fainting spells. Talk to your doctor about your risk of cancer. You may be more at risk for certain types of cancers if you take this  medicine. What side effects may I notice from receiving this medicine? Side effects that you should report to your doctor or health care professional as soon as possible: -allergic reactions like skin rash, itching or hives, swelling of the face, lips, or tongue -blood in the urine; pain in the lower back or side; pain when urinating -signs and symptoms of low blood pressure like dizziness; feeling faint or lightheaded, falls; unusually weak or tired -signs and symptoms of increased calcium like nausea; vomiting; constipation; low energy; or muscle weakness Side effects that usually do not require medical attention (report these to your doctor or health care professional if they continue or are bothersome): -headache -joint pain -nausea -pain, redness, irritation or swelling at the injection site -stomach upset This list may not describe all possible side effects. Call your doctor for medical advice about  side effects. You may report side effects to FDA at 1-800-FDA-1088. Where should I keep my medicine? Keep out of the reach of children. Store the pens in a refrigerator between 2 and 8 degrees C (36 and 46 degrees F). Do not freeze. Use the pen quickly after taking out of the refrigerator and recap and return to refrigerator right after using. Protect from light. Throw away any unused medicine 28 days after the first injection from the pen. Throw away any unused medicine after the expiration date on the label. NOTE: This sheet is a summary. It may not cover all possible information. If you have questions about this medicine, talk to your doctor, pharmacist, or health care provider.  2018 Elsevier/Gold Standard (2016-03-24 10:23:57)

## 2017-03-04 NOTE — Assessment & Plan Note (Signed)
Osteopenia with high FRAX Discussed treatment options - reviewed them each and given info regarding them Check vitamin d level Continue regular exercise She will discuss treatment options with Dr Cruzita Lederer as well to get her opinion Cal discuss with her dentist She will let me know her thoughts

## 2017-03-04 NOTE — Progress Notes (Signed)
Pre-visit discussion using our clinic review tool. No additional management support is needed unless otherwise documented below in the visit note.  

## 2017-03-04 NOTE — Patient Instructions (Addendum)
Good to see you  A large bruise on the shoulder an d a small fracture that will heal.  Vitamin D once weekly to help with healing Drained the cyst today  See me again in 4 weeks.

## 2017-03-04 NOTE — Progress Notes (Signed)
[facility] [address] [place], [place] [address] Phone: [phone] Subjective:   CC: Right arm pain  [id number]  Hannah Bryant is a 71 y.o. female coming in with complaint of right arm pain. Patient did have a fall.  Patient fell directly onto her right arm. Patient states that she did have a significant scratching. Was seen previously and did have x-rays. Patient has not found to have any type of large fracture. Patient started with conservative therapy and trying to move it but has had difficult he. Patient is moving better better but still not able to do full range of motion. Affecting daily activities and waking her up at night. Rates the severity of pain is 8 out of 10.   x-rays taken 02/06/2017 were independently visualized by me. Patient does have spurring of the greater humeral tuberosity as well as moderate degenerative changes of the acromial clavicular joint.  Past Medical History:  Diagnosis Date  . Abnormal finding on EKG    non specific T changes; short PR  . Arthritis   . Bursitis    tronchanteric  . Complication of anesthesia    Severe PONV  . Diabetes mellitus without complication ([place])    Pt states she is pre-diabetic (12-06-13)  . Diverticulosis   . Gastroparesis    93 % retenton at 2 hrs  . GERD (gastroesophageal reflux disease)   . H/O dilation and curettage   . Hernia    hiatal  . Hiatal hernia   . Hyperlipidemia   . Hyperlipidemia   . hypoglycemia   . 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 CARDIAC CATHETERIZATION  2005   < 30 % lesion  . COLONOSCOPY W/ POLYPECTOMY  2006 ,2007 & 2015   negative 2009; Dr Fuller Plan  . DILATATION & CURETTAGE/HYSTEROSCOPY WITH TRUECLEAR N/A 01/11/2014   Procedure: DILATATION & CURETTAGE/HYSTEROSCOPY WITH TRUCLEAR;  Surgeon: Anastasio Auerbach, MD;  Location: [facility];  Service: Gynecology;  Laterality: N/A;    . ESOPHAGEAL MANOMETRY  03/22/15   Decreased peristalsis  . Esophagram  03/29/15   Persistent narrowing at site of hiatal hernia; proximal dilation  . G 3 P 3    . HIATAL HERNIA REPAIR  2000  . LEG SURGERY     Trauma LLE/pins and plates  . myomectomy  1999   uterine fibroid  . TUBAL LIGATION    . Upper endoscopy and esophageal dilation  02/20/15   Dr Roney Mans , [facility]   Social History   Social History  . Marital status: Married    Spouse name: N/A  . Number of children: 2  . Years of education: N/A   Occupational History  .  Unemployed   Social History Main Topics  . Smoking status: Former Smoker    Types: Cigarettes    Quit date: 11/17/1980  . Smokeless tobacco: Never Used     Comment: smoked 1962-1982, up to 1 ppd  . Alcohol use 1.2 oz/week    2 Glasses of wine per week     Comment: socially,2 / week  . Drug use: No  . Sexual activity: Yes    Birth control/ protection: Post-menopausal   Other Topics Concern  . Not on file   Social History Narrative   Daily caffeine use   Married   Veterinary surgeon intermittently   Allergies  Allergen Reactions  . Domperidone  Stomach cramps  . Fentanyl Nausea And Vomiting  . Monistat [Miconazole Nitrate]     SEVERE BURNING  . Nsaids     REACTION: stomach complaints  . Oxycodone Hcl     REACTION: VOMITTING  . Codeine     nausea  . Erythromycin Ethylsuccinate     gastritis   Family History  Problem Relation Age of Onset  . Ulcers Father   . Diabetes Father   . Hypertension Father   . Heart attack Father     > 51  . Stroke Father     in 57s  . Heart attack Mother 23  . Diabetes Sister   . Heart attack Sister 67    CABG X 4  . Cancer Neg Hx   . Colitis Neg Hx     Past medical history, social, surgical and family history all reviewed in electronic medical record.  No pertanent information unless stated regarding to the chief complaint.   Review of Systems:Review of systems updated and as accurate as of  03/04/17  No headache, visual changes, nausea, vomiting, diarrhea, constipation, dizziness, abdominal pain, skin rash, fevers, chills, night sweats, weight loss, swollen lymph nodes, body aches, joint swelling,  chest pain, shortness of breath, mood changes. Positive muscle aches  Objective  Last menstrual period 11/18/2000. Systems examined below as of 03/04/17   General: No apparent distress alert and oriented x3 mood and affect normal, dressed appropriately.  HEENT: Pupils equal, extraocular movements intact  Respiratory: Patient's speak in full sentences and does not appear short of breath  Cardiovascular: No lower extremity edema, non tender, no erythema  Skin: Warm dry intact with no signs of infection or rash on extremities or on axial skeleton.  Abdomen: Soft nontender  Neuro: Cranial nerves II through XII are intact, neurovascularly intact in all extremities with 2+ DTRs and 2+ pulses.  Lymph: No lymphadenopathy of posterior or anterior cervical chain or axillae bilaterally.  Gait normal with good balance and coordination.  MSK:  Non tender with full range of motion and good stability and symmetric strength and tone of  elbows, wrist, hip, knee and ankles bilaterally. Arthritic changes of multiple joints Shoulder: Right Inspection reveals no abnormalities, atrophy or asymmetry. Diffuse tenderness noted. Patient does have limited active range of motion but does have full passive range of motion. Rotator cuff strength 4 out of 5 compared to contralateral sign Positive impingement. Speeds and Yergason's tests normal. Operative labral pathology and positive crossover Normal scapular function observed. painful arc but no drop arm sign. No apprehension sign  MSK US performed of: Right This study was ordered, performed, and interpreted by Charlann Boxer D.O.  Shoulder:   Supraspinatus:  Appears normal on long and transverse views, Bursal bulge seen with shoulder abduction on  impingement view. Large cystic formation noted anteriorly Infraspinatus:  Appears normal on long and transverse views. Significant increase in Doppler flow Subscapularis:  Appears normal on long and transverse views. Positive bursa large cystic swelling noted anteriorly Teres Minor:  Appears normal on long and transverse views. AC joint:  Capsule undistended, no geyser sign. Glenohumeral Joint:  Appears normal without effusion. Glenoid Labrum:  Intact without visualized tears. Biceps Tendon:  Appears normal on long and transverse views, no fraying of tendon, tendon located in intertubercular groove, no subluxation with shoulder internal or external rotation.  Impression: Subacromial bursitis  Procedure: Real-time Ultrasound Guided Injection of right glenohumeral joint Device: GE Logiq E  Ultrasound guided injection is preferred based studies that  show increased duration, increased effect, greater accuracy, decreased procedural pain, increased response rate with ultrasound guided versus blind injection.  Verbal informed consent obtained.  Time-out conducted.  Noted no overlying erythema, induration, or other signs of local infection.  Skin prepped in a sterile fashion.  Local anesthesia: Topical Ethyl chloride.  With sterile technique and under real time ultrasound guidance:  Joint visualized.  23g 1  inch needle inserted posterior approach. Pictures taken for needle placement. Patient did have injection of 2 cc of 1% lidocaine, 2 cc of 0.5% Marcaine, and aspirated 8 mL of frank blood. Then injected 1.0 cc of Kenalog 40 mg/dL. Completed without difficulty  Pain immediately resolved suggesting accurate placement of the medication.  Advised to call if fevers/chills, erythema, induration, drainage, or persistent bleeding.  Images permanently stored and available for review in the ultrasound unit.  Impression: Technically successful ultrasound guided injection.    Impression and  Recommendations:     This case required medical decision making of moderate complexity.      Note: This dictation was prepared with Dragon dictation along with smaller phrase technology. Any transcriptional errors that result from this process are unintentional.

## 2017-03-05 ENCOUNTER — Encounter: Payer: Self-pay | Admitting: Internal Medicine

## 2017-03-16 ENCOUNTER — Other Ambulatory Visit: Payer: Self-pay | Admitting: Internal Medicine

## 2017-03-30 NOTE — Progress Notes (Signed)
[facility] [address] [place] [address] [place], [place] [address] Phone: [phone] Subjective:   CC: Right arm pain  GHW:[id number]  Hannah Bryant is a 71 y.o. female coming in with complaint of right arm pain. Patient did have a fall.  This is greater than a month ago. Was found to have a significant effusion with possible cyst. Had aspiration. Patient did have severe pain for 24 hours and then seemed to resolve. Still having pain going down the midportion of her arm. Not stopping her from significant amount of activities. States that certain movements are discussed the pain. No numbness. Has not been working out the upper body at this time.   x-rays taken 02/06/2017 were independently visualized by me. Patient does have spurring of the greater humeral tuberosity as well as moderate degenerative changes of the acromial clavicular joint.  Past Medical History:  Diagnosis Date  . Abnormal finding on EKG    non specific T changes; short PR  . Arthritis   . Bursitis    tronchanteric  . Complication of anesthesia    Severe PONV  . Diabetes mellitus without complication ([place])    Pt states she is pre-diabetic (12-06-13)  . Diverticulosis   . Gastroparesis    93 % retenton at 2 hrs  . GERD (gastroesophageal reflux disease)   . H/O dilation and curettage   . Hernia    hiatal  . Hiatal hernia   . Hyperlipidemia   . Hyperlipidemia   . hypoglycemia   . 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 CARDIAC CATHETERIZATION  2005   < 30 % lesion  . COLONOSCOPY W/ POLYPECTOMY  2006 ,2007 & 2015   negative 2009; Dr Fuller Plan  . DILATATION & CURETTAGE/HYSTEROSCOPY WITH TRUECLEAR N/A 01/11/2014   Procedure: DILATATION & CURETTAGE/HYSTEROSCOPY WITH TRUCLEAR;  Surgeon: Anastasio Auerbach, MD;  Location: [facility];  Service: Gynecology;  Laterality: N/A;  . ESOPHAGEAL MANOMETRY  03/22/15   Decreased  peristalsis  . Esophagram  03/29/15   Persistent narrowing at site of hiatal hernia; proximal dilation  . G 3 P 3    . HIATAL HERNIA REPAIR  2000  . LEG SURGERY     Trauma LLE/pins and plates  . myomectomy  1999   uterine fibroid  . TUBAL LIGATION    . Upper endoscopy and esophageal dilation  02/20/15   Dr Roney Mans , [facility]   Social History   Social History  . Marital status: Married    Spouse name: N/A  . Number of children: 2  . Years of education: N/A   Occupational History  .  Unemployed   Social History Main Topics  . Smoking status: Former Smoker    Types: Cigarettes    Quit date: 11/17/1980  . Smokeless tobacco: Never Used     Comment: smoked 1962-1982, up to 1 ppd  . Alcohol use 1.2 oz/week    2 Glasses of wine per week     Comment: socially,2 / week  . Drug use: No  . Sexual activity: Yes    Birth control/ protection: Post-menopausal   Other Topics Concern  . None   Social History Narrative   Daily caffeine use   Married   Veterinary surgeon intermittently   Allergies  Allergen Reactions  . Domperidone     Stomach cramps  . Fentanyl Nausea And Vomiting  . Monistat [employer]  Nitrate]     SEVERE BURNING  . Nsaids     REACTION: stomach complaints  . Oxycodone Hcl     REACTION: VOMITTING  . Codeine     nausea  . Erythromycin Ethylsuccinate     gastritis   Family History  Problem Relation Age of Onset  . Ulcers Father   . Diabetes Father   . Hypertension Father   . Heart attack Father        > 32  . Stroke Father        in 74s  . Heart attack Mother 63  . Diabetes Sister   . Heart attack Sister 1       CABG X 4  . Cancer Neg Hx   . Colitis Neg Hx     Past medical history, social, surgical and family history all reviewed in electronic medical record.  No pertanent information unless stated regarding to the chief complaint.   Review of Systems: No headache, visual changes, nausea, vomiting, diarrhea, constipation, dizziness, abdominal  pain, skin rash, fevers, chills, night sweats, weight loss, swollen lymph nodes, body aches, joint swelling, muscle aches, chest pain, shortness of breath, mood changes.    Objective  Blood pressure 122/80, pulse 61, height 5\' 3"  (1.6 m), last menstrual period 11/18/2000, SpO2 93 %.   Systems examined below as of 03/31/17 General: NAD A&O x3 mood, affect normal  HEENT: Pupils equal, extraocular movements intact no nystagmus Respiratory: not short of breath at rest or with speaking Cardiovascular: No lower extremity edema, non tender Skin: Warm dry intact with no signs of infection or rash on extremities or on axial skeleton. Abdomen: Soft nontender, no masses Neuro: Cranial nerves  intact, neurovascularly intact in all extremities with 2+ DTRs and 2+ pulses. Lymph: No lymphadenopathy appreciated today  Gait normal with good balance and coordination.  MSK:  Non tender with full range of motion and good stability and symmetric strength and tone of  elbows, wrist, hip, knee and ankles bilaterally. Arthritic changes of multiple joints Shoulder: Right Inspection reveals no abnormalities, atrophy or asymmetry. Nontender on exam today. Full range of motion noted today. Does have pain with the last 5 of external rotation Rotator cuff strength 4+ out of 5 compared to contralateral sign Positive impingement still remaining but improved Speeds and Yergason's tests normal. Positive labral pathology and positive crossover Normal scapular function observed. painful arc but no drop arm sign. No apprehension sign Contralateral shoulder unremarkable    Impression and Recommendations:     This case required medical decision making of moderate complexity.      Note: This dictation was prepared with Dragon dictation along with smaller phrase technology. Any transcriptional errors that result from this process are unintentional.

## 2017-03-31 ENCOUNTER — Ambulatory Visit (INDEPENDENT_AMBULATORY_CARE_PROVIDER_SITE_OTHER): Payer: Medicare Other | Admitting: Family Medicine

## 2017-03-31 ENCOUNTER — Encounter: Payer: Self-pay | Admitting: Family Medicine

## 2017-03-31 DIAGNOSIS — M25819 Other specified joint disorders, unspecified shoulder: Secondary | ICD-10-CM | POA: Diagnosis not present

## 2017-03-31 NOTE — Assessment & Plan Note (Signed)
Patient is no signs of recurrent cyst formation at this time. Patient seems to be doing relatively well with the conservative therapy. Discussed continuing the conservative therapy and increase activity slowly. Return to lifting regimen given. Patient will continue with the vitamin D and is considering different osteoporosis medications. Patient will follow-up with me again in 4-6 weeks.

## 2017-03-31 NOTE — Patient Instructions (Signed)
Go to see you  Start lifting after pain resolve Continue the vitamins Keep hands within peripheral vision.  See me again in 4-6 weeks if needed.

## 2017-04-01 ENCOUNTER — Encounter: Payer: Self-pay | Admitting: Internal Medicine

## 2017-04-01 ENCOUNTER — Ambulatory Visit (INDEPENDENT_AMBULATORY_CARE_PROVIDER_SITE_OTHER): Payer: Medicare Other | Admitting: Internal Medicine

## 2017-04-01 VITALS — BP 132/80 | HR 63 | Ht 62.5 in | Wt 147.0 lb

## 2017-04-01 DIAGNOSIS — M85852 Other specified disorders of bone density and structure, left thigh: Secondary | ICD-10-CM | POA: Diagnosis not present

## 2017-04-01 DIAGNOSIS — E161 Other hypoglycemia: Secondary | ICD-10-CM | POA: Diagnosis not present

## 2017-04-01 DIAGNOSIS — M85851 Other specified disorders of bone density and structure, right thigh: Secondary | ICD-10-CM

## 2017-04-01 DIAGNOSIS — E11649 Type 2 diabetes mellitus with hypoglycemia without coma: Secondary | ICD-10-CM | POA: Diagnosis not present

## 2017-04-01 LAB — BASIC METABOLIC PANEL WITH GFR
BUN: 12 mg/dL (ref 7–25)
CALCIUM: 9.9 mg/dL (ref 8.6–10.4)
CO2: 31 mmol/L (ref 20–31)
CREATININE: 0.71 mg/dL (ref 0.60–0.93)
Chloride: 101 mmol/L (ref 98–110)
GFR, Est African American: >89 mL/min (ref >=60)
GFR, Est Non African American: 85 mL/min (ref >=60)
GLUCOSE: 107 mg/dL — AB (ref 65–99)
Potassium: 4.3 mmol/L (ref 3.5–5.3)
Sodium: 142 mmol/L (ref 135–146)

## 2017-04-01 LAB — POCT GLYCOSYLATED HEMOGLOBIN (HGB A1C): Hemoglobin A1C: 6.2

## 2017-04-01 MED ORDER — GLUCOSE BLOOD VI STRP: ORAL_STRIP | 1 refills | Status: DC

## 2017-04-01 MED ORDER — FREESTYLE LIBRE READER DEVI: 1.0000 | Freq: Three times a day (TID) | 1 refills | Status: DC

## 2017-04-01 MED ORDER — FREESTYLE LIBRE SENSOR SYSTEM MISC: 1.0000 | 11 refills | Status: DC

## 2017-04-01 NOTE — Addendum Note (Signed)
Addended by: Caprice Beaver T on: 04/01/2017 11:53 AM   Modules accepted: Orders

## 2017-04-01 NOTE — Addendum Note (Signed)
Addended by: Philemon Kingdom on: 04/01/2017 11:42 AM   Modules accepted: Orders

## 2017-04-01 NOTE — Patient Instructions (Addendum)
Please continue to check sugars as needed.  Please let me know if we can start Prolia.   Please come back in 6 months.  Please stop at the lab.

## 2017-04-01 NOTE — Progress Notes (Addendum)
Patient ID: Hannah Bryant, female   DOB: November 23, 1945, 71 y.o.   MRN: [id number]  HPI: Hannah Bryant is a 71 y.o.-year-old female, returning for f/u for DM2, dx in ~2009 (or earlier), diet-controlled, with complications (reactive hypoglycemia). Previously seen by Dr. Chalmers Cater. Last 06/2016.    She lost 20 lbs in last year (intentional)!  She recently fell and apparently broke humerus >> not obvious in her records.  Reactive hypoglycemia:  Reviewed hx: Pt described that has had hypoglycemia every pm for "ages". Sometimes: sweating, feeling hot, nausea, sometimes vomiting.  She may need to take a nap afterward.  She started to see Dr Roney Mans ([place]) >> hypoglycemic episodes = better. She had an EGD, Ba swallow test >> she had a stricture at the site of her previous hiatal hernia with food retention in the Esophagus long after she eats (procedure: Spring 2016). She had Esophageal dilation. She also had a capsule endoscopy.   Lowest CBG now 54 >> in [place] (had a different b'fast - usually oatmeal). In the pms, she still has 50s-70s. She takes OJ for these. Lunch is a sandwich/wrap + yoghurt at 1 pm and snack at 3 pm (cheese or PB+apples or PB crackers), supper at 7:30 pm.  She treats the lows with OJ or honey sticks.   She is still trying to get 10,000 steps in (has a fitbit).  She will travel to [place] in 2 weeks (cruise).  DM2:  Last hemoglobin A1c was: Lab Results  Component Value Date   HGBA1C 6.9 (H) 01/05/2017   HGBA1C 6.5 06/30/2016   HGBA1C 6.7 (H) 02/28/2016   She was on previously for her diabetes but she stopped that she was not feeling well on it. She would not want to restart.  Pt checks her sugars 6x a day and they are: - am: 99-116 >> 124-134 >> 130-145 >> 132-160 >> 130-135 - 2h after b'fast: n/c >> 140 >> n/c - before lunch: n/c >> 100-150, 198, 222 >> n/c - after lunch: 180-200 then 68-100 later in the pm >> 115-189 >> 120s before snack - before dinner:  n/c >> 115, 123-150, 179, 215 >> n/c - 2h after dinner: n/c >> 49x1, 61, 64, 74-158, 167 >> n/c  - bedtime: n/c  - nighttime: n/c No lows. Lowest sugar was 40s >> 68 >> 49 (delayed dinner, had no sxs) >> 50s.  she has hypoglycemia awareness at 54s.  Highest sugar was 160s..  Glucometer: 4: OneTouch Verio and Freestyle Lite  She saw our nutritionist, Antonieta Iba. Last visit 02/2015.  - no CKD, last BUN/creatinine:  Lab Results  Component Value Date   BUN 14 01/05/2017   CREATININE 0.67 01/05/2017   - last set of lipids: Lab Results  Component Value Date   CHOL 153 01/05/2017   HDL 53.10 01/05/2017   LDLCALC 68 01/05/2017   TRIG 161.0 (H) 01/05/2017   CHOLHDL 3 01/05/2017  On Lipitor.  Since last visit, she had a DEXA scan >> osteopenia. However, as she has a h/o 2 fractures >> PCP d/w her about different anti-osteoporotic meds. She would like my opinion about these.   We reviewed her DEXA scan report (02/08/2017) together:  Lumbar spine (L1-L4) Femoral neck (FN)  T-score 0.6 RFN:-2.0 LFN:-1.6  Change in BMD from previous DXA test (%) Down 2.0% Down 4.3%   Also, she had a slightly low vitamin D >> now taking a supplement.  ROS: Constitutional: no weight gain/no weight loss, no  fatigue, no subjective hyperthermia, no subjective hypothermia Eyes: no blurry vision, no xerophthalmia ENT: no sore throat, no nodules palpated in throat, no dysphagia, no odynophagia, no hoarseness Cardiovascular: no CP/no SOB/no palpitations/no leg swelling Respiratory: no cough/no SOB/no wheezing Gastrointestinal: no N/no V/no D/no C/no acid reflux Musculoskeletal: no muscle aches/no joint aches Skin: no rashes, no hair loss Neurological: no tremors/no numbness/no tingling/no dizziness  I reviewed pt's medications, allergies, PMH, social hx, family hx, and changes were documented in the history of present illness. Otherwise, unchanged from my initial visit note.  History   Social History  .  Marital Status: Married    Spouse Name: N/A  . Number of Children: 2   Social History Main Topics  . Smoking status: Former Smoker    Types: Cigarettes    Quit date: 11/17/1980  . Smokeless tobacco: Never Used     Comment: smoked 1962-1982, up to 1 ppd  . Alcohol Use: 1.2 oz/week    2 Glasses of wine per week     Comment: socially,2 / week  . Drug Use: No  . Sexual Activity: Yes    Birth Control/ Protection: Post-menopausal   Social History Narrative   Daily caffeine use   Married   Veterinary surgeon intermittently   Past Medical History:  Diagnosis Date  . Abnormal finding on EKG    non specific T changes; short PR  . Arthritis   . Bursitis    tronchanteric  . Complication of anesthesia    Severe PONV  . Diabetes mellitus without complication ([place])    Pt states she is pre-diabetic (12-06-13)  . Diverticulosis   . Gastroparesis    93 % retenton at 2 hrs  . GERD (gastroesophageal reflux disease)   . H/O dilation and curettage   . Hernia    hiatal  . Hiatal hernia   . Hyperlipidemia   . Hyperlipidemia   . hypoglycemia   . 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 CARDIAC CATHETERIZATION  2005   < 30 % lesion  . COLONOSCOPY W/ POLYPECTOMY  2006 ,2007 & 2015   negative 2009; Dr Fuller Plan  . DILATATION & CURETTAGE/HYSTEROSCOPY WITH TRUECLEAR N/A 01/11/2014   Procedure: DILATATION & CURETTAGE/HYSTEROSCOPY WITH TRUCLEAR;  Surgeon: Anastasio Auerbach, MD;  Location: [facility];  Service: Gynecology;  Laterality: N/A;  . ESOPHAGEAL MANOMETRY  03/22/15   Decreased peristalsis  . Esophagram  03/29/15   Persistent narrowing at site of hiatal hernia; proximal dilation  . G 3 P 3    . HIATAL HERNIA REPAIR  2000  . LEG SURGERY     Trauma LLE/pins and plates  . myomectomy  1999   uterine fibroid  . TUBAL LIGATION    . Upper endoscopy and esophageal dilation  02/20/15   Dr Roney Mans , [facility]   Current  Outpatient Prescriptions on File Prior to Visit  Medication Sig Dispense Refill  . acetaminophen (TYLENOL ARTHRITIS PAIN) 650 MG CR tablet Take 650 mg by mouth. 2 by mouth 2 times daily     . aspirin 81 MG chewable tablet Chew 81 mg by mouth daily.    Marland Kitchen atorvastatin (LIPITOR) 40 MG tablet TAKE 1 TABLET BY MOUTH DAILY 90 tablet 3  . Calcium-Vitamin D-Vitamin K (VIACTIV PO) Take by mouth. Reported on 02/28/2016    . esomeprazole (NEXIUM) 40 MG capsule Take 1 capsule (40 mg total) by mouth daily. --- Needs  office visit before further refills   30 capsule 2  . fexofenadine (ALLEGRA) 180 MG tablet Take 180 mg by mouth daily.      Marland Kitchen glucose blood test strip Use 7-10 x a day - Verio 900 each 1  . hydrochlorothiazide (MICROZIDE) 12.5 MG capsule Take 1 capsule (12.5 mg total) by mouth daily. 90 capsule 3  . hyoscyamine (LEVSIN SL) 0.125 MG SL tablet DISSOLVE ONE OR TWO TABLETS UNDER TONGUE EVERY 4-6 HOURS AS NEEDED FOR ABDOMINAL PAIN AND CRAMPING 60 tablet 2  . Lancets MISC by Does not apply route. Lifespan ultra 2 test strips and lancets     . meclizine (ANTIVERT) 12.5 MG tablet Take 1-2 tablets (12.5-25 mg total) by mouth 3 (three) times daily as needed for dizziness. 60 tablet 0  . montelukast (SINGULAIR) 10 MG tablet 1 qd prn 90 tablet 3  . OVER THE COUNTER MEDICATION multistrain probiotic    . PENNSAID 2 % SOLN APPLY TWICE DAILY (Patient taking differently: APPLY TWICE DAILY PRN) 112 g 1  . promethazine (PHENERGAN) 25 MG tablet Take 1 tablet (25 mg total) by mouth every 8 (eight) hours as needed for nausea or vomiting. 30 tablet 0  . Vitamin D, Ergocalciferol, (DRISDOL) 50000 units CAPS capsule Take 1 capsule (50,000 Units total) by mouth every 7 (seven) days. 12 capsule 0   No current facility-administered medications on file prior to visit.    Allergies  Allergen Reactions  . Domperidone     Stomach cramps  . Fentanyl Nausea And Vomiting  . Monistat [Miconazole Nitrate]     SEVERE BURNING   . Nsaids     REACTION: stomach complaints  . Oxycodone Hcl     REACTION: VOMITTING  . Codeine     nausea  . Erythromycin Ethylsuccinate     gastritis   Family History  Problem Relation Age of Onset  . Ulcers Father   . Diabetes Father   . Hypertension Father   . Heart attack Father        > 40  . Stroke Father        in 47s  . Heart attack Mother 85  . Diabetes Sister   . Heart attack Sister 80       CABG X 4  . Cancer Neg Hx   . Colitis Neg Hx    PE: BP 132/80 (BP Location: Left Arm, Patient Position: Sitting)   Pulse 63   Ht 5' 2.5" (1.588 m)   Wt 147 lb (66.7 kg)   LMP 11/18/2000   SpO2 97%   BMI 26.46 kg/m  Body mass index is 26.46 kg/m. Wt Readings from Last 3 Encounters:  04/01/17 147 lb (66.7 kg)  03/04/17 151 lb (68.5 kg)  02/06/17 151 lb 12.8 oz (68.9 kg)   Constitutional: overweight, in NAD Eyes: PERRLA, EOMI, no exophthalmos ENT: moist mucous membranes, no thyromegaly, no cervical lymphadenopathy Cardiovascular: RRR, No MRG Respiratory: CTA B Gastrointestinal: abdomen soft, NT, ND, BS+ Musculoskeletal: no deformities, strength intact in all 4 Skin: moist, warm, no rashes Neurological: no tremor with outstretched hands, DTR normal in all 4  ASSESSMENT: 1. Diet-controlled DM2, without complications.  2. Hypoglycemia - possibly reactive - improved - we r/o Adrenal insufficiency: Component     Latest Ref Rng 02/02/2015 02/02/2015 02/02/2015         9:21 AM 10:02 AM 10:02 AM  Cortisol, Plasma      7.1 29.1 24.2  C206 ACTH     6 -  50 pg/mL 12     PLAN:  1. DM2, fairly well controlled - Diet controlled - No major sugar spikes since last visit especially after she lost weight, by walking 10,000 steps a day, most days. - No further intervention is needed for now -  HbA1c level: 6.2% today  2. Patient with history of reactive (postprandial) hypoglycemia, With still low p.m. CBG levels.  - still has few postprandial hypoglycemia episodes, which  have improved after her Es stretching but they are still present - They have not worsened since last visit, but she still continues to have sugars in the 50s in the afternoons. She has a snack in the middle of the afternoon, however, if she relates this, she drops her sugars in the 50s. We discussed about improving her lunch to avoid further hypoglycemia - Again discussed to avoid liquids with meals, and I even advise her to move yogurt from lunchtime to a snack - She prefers to correct her low blood sugars with or issues and honey. We'll continue this, but we discussed about using approximately 15 g of carbs, since using more will cause her sugars to spike afterwards  3. Osteopenia + fractures - likely postmenopausal - Discussed about increased risk of fracture, depending on the T score, greatly increased when the T score is lower than -2.5, but it is actually a continuum and -2.5 should not be regarded as an absolute threshold. We reviewed her latest DEXA scan report together, and I explained that based on the T scores, she has an increased risk for fractures, especially at the level of the hips - We discussed about the different medication classes, benefits and side effects (including atypical fractures and ONJ - no dental workup in progress or planned).  - I explained that, since she has GERD, I would not use oral bisphosphonates, so my first choice would be sq denosumab (Prolia) for 3-6 years, then zoledronic acid (iv Reclast) for 1-2 years. I would use Teriparatide as a last resort.  I explained the mechanism of action and expected benefits for Prolia. She is open to this, but would like to discuss it with her dentist first. She will let me know about her decision. In the meantime, we can start the preauthorization for the medication.  - Will check the following labs today: Orders Placed This Encounter  Procedures  . BASIC METABOLIC PANEL WITH GFR  - will check a new DEXA scan in 2 years after  starting Prolia -  I explained that the first indication that the treatment is working is her not having anymore fractures. DEXA scan changes are secondary: unchanged or slightly higher T-scores are desirable  - time spent with the patient: 40 minutes, of which >50% was spent in obtaining information about her symptoms, reviewing her previous labs, evaluations, and treatments, counseling her about her conditions- including osteopenia (new to me); (please see the discussed topics above), and developing a plan to further investigate and treat these; she had a number of questions which I addressed.  Office Visit on 04/01/2017  Component Date Value Ref Range Status  . Sodium 04/01/2017 142  135 - 146 mmol/L Final  . Potassium 04/01/2017 4.3  3.5 - 5.3 mmol/L Final  . Chloride 04/01/2017 101  98 - 110 mmol/L Final  . CO2 04/01/2017 31  20 - 31 mmol/L Final  . Glucose, Bld 04/01/2017 107* 65 - 99 mg/dL Final  . BUN 04/01/2017 12  7 - 25 mg/dL Final  . Creat  04/01/2017 0.71  0.60 - 0.93 mg/dL Final   Comment:   For patients > or = 72 years of age: The upper reference limit for Creatinine is approximately 13% higher for people identified as African-American.     . Calcium 04/01/2017 9.9  8.6 - 10.4 mg/dL Final  . GFR, Est African American 04/01/2017 >89  >=60 mL/min Final  . GFR, Est Non African American 04/01/2017 85  >=60 mL/min Final  . Hemoglobin A1C 04/01/2017 6.2   Final   Labs are normal. Will start PA for Prolia.  Philemon Kingdom, MD PhD [facility]

## 2017-04-02 ENCOUNTER — Encounter: Payer: Self-pay | Admitting: Internal Medicine

## 2017-04-02 ENCOUNTER — Encounter: Payer: Self-pay | Admitting: Family Medicine

## 2017-04-03 ENCOUNTER — Telehealth: Payer: Self-pay | Admitting: Internal Medicine

## 2017-04-03 ENCOUNTER — Encounter: Payer: Self-pay | Admitting: Internal Medicine

## 2017-04-03 ENCOUNTER — Other Ambulatory Visit: Payer: Self-pay | Admitting: Internal Medicine

## 2017-04-03 MED ORDER — ZOSTER VAC RECOMB ADJUVANTED 50 MCG/0.5ML IM SUSR: 0.5000 mL | Freq: Once | INTRAMUSCULAR | 1 refills | Status: DC

## 2017-04-03 MED ORDER — ZOSTER VAC RECOMB ADJUVANTED 50 MCG/0.5ML IM SUSR: 0.5000 mL | Freq: Once | INTRAMUSCULAR | 1 refills | Status: AC

## 2017-04-03 NOTE — Telephone Encounter (Signed)
Please refax Shingrix with diagnostics code

## 2017-04-09 ENCOUNTER — Encounter: Payer: Self-pay | Admitting: Internal Medicine

## 2017-04-09 ENCOUNTER — Other Ambulatory Visit: Payer: Self-pay

## 2017-04-09 DIAGNOSIS — M25511 Pain in right shoulder: Secondary | ICD-10-CM

## 2017-04-09 MED ORDER — GLUCOSE BLOOD VI STRP: ORAL_STRIP | 5 refills | Status: DC

## 2017-04-14 ENCOUNTER — Telehealth: Payer: Self-pay | Admitting: *Deleted

## 2017-04-14 ENCOUNTER — Other Ambulatory Visit: Payer: Self-pay

## 2017-04-14 ENCOUNTER — Telehealth: Payer: Self-pay | Admitting: Gastroenterology

## 2017-04-14 ENCOUNTER — Telehealth: Payer: Self-pay | Admitting: Internal Medicine

## 2017-04-14 ENCOUNTER — Encounter: Payer: Self-pay | Admitting: Gastroenterology

## 2017-04-14 DIAGNOSIS — Z85828 Personal history of other malignant neoplasm of skin: Secondary | ICD-10-CM | POA: Diagnosis not present

## 2017-04-14 DIAGNOSIS — D485 Neoplasm of uncertain behavior of skin: Secondary | ICD-10-CM | POA: Diagnosis not present

## 2017-04-14 DIAGNOSIS — L570 Actinic keratosis: Secondary | ICD-10-CM | POA: Diagnosis not present

## 2017-04-14 DIAGNOSIS — L821 Other seborrheic keratosis: Secondary | ICD-10-CM | POA: Diagnosis not present

## 2017-04-14 DIAGNOSIS — L738 Other specified follicular disorders: Secondary | ICD-10-CM | POA: Diagnosis not present

## 2017-04-14 MED ORDER — GLUCOSE BLOOD VI STRP: ORAL_STRIP | 5 refills | Status: DC

## 2017-04-14 NOTE — Telephone Encounter (Signed)
Records have been placed on Dr.Stark's desk for review.

## 2017-04-14 NOTE — Telephone Encounter (Signed)
Dr.Stark reviewed records and accepted for patient to come back if she agrees to have all Gi care here unless otherwise directed by Gastroenterologist. Patient has been scheduled for recall Colonoscopy.

## 2017-04-14 NOTE — Telephone Encounter (Signed)
Information submitted to [employer] assist.

## 2017-04-14 NOTE — Telephone Encounter (Signed)
Pt's prolia benefits were recently submitted by your office. A fax was received indicating they are missing the pts insurance information and is requesting it be submitted, to prevent any delays in approval.

## 2017-04-19 ENCOUNTER — Encounter: Payer: Self-pay | Admitting: Internal Medicine

## 2017-04-20 ENCOUNTER — Other Ambulatory Visit: Payer: Self-pay

## 2017-04-20 MED ORDER — GLUCOSE BLOOD VI STRP: ORAL_STRIP | 2 refills | Status: DC

## 2017-05-08 ENCOUNTER — Ambulatory Visit
Admission: RE | Admit: 2017-05-08 | Discharge: 2017-05-08 | Disposition: A | Discharge status: Home / Self Care | Payer: Medicare Other | Source: Ambulatory Visit | Attending: Family Medicine | Admitting: Family Medicine

## 2017-05-08 ENCOUNTER — Encounter: Payer: Self-pay | Admitting: Family Medicine

## 2017-05-08 DIAGNOSIS — M25511 Pain in right shoulder: Secondary | ICD-10-CM

## 2017-05-08 DIAGNOSIS — M75121 Complete rotator cuff tear or rupture of right shoulder, not specified as traumatic: Secondary | ICD-10-CM | POA: Diagnosis not present

## 2017-05-08 MED ORDER — IOPAMIDOL (ISOVUE-M 200) INJECTION 41%
15.0000 mL | Freq: Once | INTRAMUSCULAR | Status: AC
  Administered 2017-05-08: 15 mL via INTRA_ARTICULAR

## 2017-05-11 ENCOUNTER — Ambulatory Visit (INDEPENDENT_AMBULATORY_CARE_PROVIDER_SITE_OTHER): Payer: Medicare Other | Admitting: Family Medicine

## 2017-05-11 ENCOUNTER — Encounter: Payer: Self-pay | Admitting: Family Medicine

## 2017-05-11 VITALS — BP 140/82 | HR 67 | Ht 63.0 in

## 2017-05-11 DIAGNOSIS — M75101 Unspecified rotator cuff tear or rupture of right shoulder, not specified as traumatic: Secondary | ICD-10-CM | POA: Insufficient documentation

## 2017-05-11 DIAGNOSIS — M75121 Complete rotator cuff tear or rupture of right shoulder, not specified as traumatic: Secondary | ICD-10-CM

## 2017-05-11 DIAGNOSIS — M25511 Pain in right shoulder: Secondary | ICD-10-CM | POA: Diagnosis not present

## 2017-05-11 NOTE — Patient Instructions (Signed)
Good to see you  I am sorry for the news Dr. Tamera Punt office will be calling you soon.  I am here if you have questions.

## 2017-05-11 NOTE — Progress Notes (Signed)
[name] [address] [place], [place] [address] Phone: [phone] Subjective:   CC: Right arm pain  [id number]  Hannah Bryant is a 71 y.o. female coming in with complaint of right arm pain. Patient did have a fall. Had significant weakness. Patient was sent for an MRI. MRI independently visualized by me and interpreted. showed significant retraction with a large full-thickness tear of the rotator cuff. Patient states He is having pain on a daily basis. Patient is in such severe amount of pain that is affecting daily activities, sleep, even things such as dressing become difficult. Feels like she continues to have weakness of the arm.   x-rays taken 02/06/2017 were independently visualized by me. Patient does have spurring of the greater humeral tuberosity as well as moderate degenerative changes of the acromial clavicular joint.  Past Medical History:  Diagnosis Date  . Abnormal finding on EKG    non specific T changes; short PR  . Arthritis   . Bursitis    tronchanteric  . Complication of anesthesia    Severe PONV  . Diabetes mellitus without complication ([place])    Pt states she is pre-diabetic (12-06-13)  . Diverticulosis   . Gastroparesis    93 % retenton at 2 hrs  . GERD (gastroesophageal reflux disease)   . H/O dilation and curettage   . Hernia    hiatal  . Hiatal hernia   . Hyperlipidemia   . Hyperlipidemia   . hypoglycemia   . 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 CARDIAC CATHETERIZATION  2005   < 30 % lesion  . COLONOSCOPY W/ POLYPECTOMY  2006 ,2007 & 2015   negative 2009; Dr Fuller Plan  . DILATATION & CURETTAGE/HYSTEROSCOPY WITH TRUECLEAR N/A 01/11/2014   Procedure: DILATATION & CURETTAGE/HYSTEROSCOPY WITH TRUCLEAR;  Surgeon: Anastasio Auerbach, MD;  Location: [facility];  Service: Gynecology;  Laterality: N/A;  . ESOPHAGEAL MANOMETRY  03/22/15   Decreased peristalsis  . Esophagram  03/29/15   Persistent narrowing at site of hiatal hernia; proximal dilation  . G 3 P 3    . HIATAL HERNIA REPAIR  2000  . LEG SURGERY     Trauma LLE/pins and plates  . myomectomy  1999   uterine fibroid  . TUBAL LIGATION    . Upper endoscopy and esophageal dilation  02/20/15   Dr Roney Mans , [facility]   Social History   Social History  . Marital status: Married    Spouse name: N/A  . Number of children: 2  . Years of education: N/A   Occupational History  .  Unemployed   Social History Main Topics  . Smoking status: Former Smoker    Types: Cigarettes    Quit date: 11/17/1980  . Smokeless tobacco: Never Used     Comment: smoked 1962-1982, up to 1 ppd  . Alcohol use 1.2 oz/week    2 Glasses of wine per week     Comment: socially,2 / week  . Drug use: No  . Sexual activity: Yes    Birth control/ protection: Post-menopausal   Other Topics Concern  . None   Social History Narrative   Daily caffeine use   Married   Veterinary surgeon intermittently   Allergies  Allergen Reactions  . Domperidone     Stomach cramps  . Fentanyl Nausea And Vomiting  . Monistat [Miconazole Nitrate]  SEVERE BURNING  . Nsaids     REACTION: stomach complaints  . Oxycodone Hcl     REACTION: VOMITTING  . Codeine     nausea  . Erythromycin Ethylsuccinate     gastritis   Family History  Problem Relation Age of Onset  . Ulcers Father   . Diabetes Father   . Hypertension Father   . Heart attack Father        > 57  . Stroke Father        in 88s  . Heart attack Mother 83  . Diabetes Sister   . Heart attack Sister 18       CABG X 4  . Cancer Neg Hx   . Colitis Neg Hx     Past medical history, social, surgical and family history all reviewed in electronic medical record.  No pertanent information unless stated regarding to the chief complaint.   Review of Systems: No headache, visual changes, nausea, vomiting, diarrhea, constipation, dizziness,  abdominal pain, skin rash, fevers, chills, night sweats, weight loss, swollen lymph nodes, body aches, joint swelling, chest pain, shortness of breath, mood changes.  Positive muscle aches   Objective  Blood pressure 140/82, pulse 67, height 5\' 3"  (1.6 m), last menstrual period 11/18/2000, SpO2 98 %.   Systems examined below as of 05/11/17 General: NAD A&O x3 mood, affect normal  HEENT: Pupils equal, extraocular movements intact no nystagmus Respiratory: not short of breath at rest or with speaking Cardiovascular: No lower extremity edema, non tender Skin: Warm dry intact with no signs of infection or rash on extremities or on axial skeleton. Abdomen: Soft nontender, no masses Neuro: Cranial nerves  intact, neurovascularly intact in all extremities with 2+ DTRs and 2+ pulses. Lymph: No lymphadenopathy appreciated today  Gait normal with good balance and coordination.  MSK: Non tender with full range of motion and good stability and symmetric strength and tone of  elbows, wrist,  knee hips and ankles bilaterally.   Shoulder: Right Inspection reveals no abnormalities, atrophy or asymmetry. Palpation is normal with no tenderness over AC joint or bicipital groove. Mild decrease in range of motion. Rotator cuff strength 3 out of 5 compared to contralateral sign Positive impingement Speeds and Yergason's tests normal. Positive O'Brien's Normal scapular function observed. Positive painful arc and drop arm sign No apprehension sign Contralateral shoulder unremarkable      Impression and Recommendations:     This case required medical decision making of moderate complexity.      Note: This dictation was prepared with Dragon dictation along with smaller phrase technology. Any transcriptional errors that result from this process are unintentional.

## 2017-05-11 NOTE — Assessment & Plan Note (Signed)
No improvement and potentially worsening symptoms. Patient is a very large rotator cuff tear noted. Discussed with patient at great length. Discussed with patient for greater than 30 minutes face-to-face with greater than 50% of the discussion was about assessment and plan including possible conservative therapy as well as surgical intervention. Patient is having so much pain is not able to do many daily activities. Patient does have retraction of 2 cm of the rotator cuff which does make surgical intervention difficult but not impossible. Patient will be referred to orthopedic surgery for further evaluation and treatment. Marland Kitchen

## 2017-05-11 NOTE — Progress Notes (Signed)
amb  

## 2017-05-12 ENCOUNTER — Encounter: Payer: Self-pay | Admitting: Family Medicine

## 2017-05-13 DIAGNOSIS — M75121 Complete rotator cuff tear or rupture of right shoulder, not specified as traumatic: Secondary | ICD-10-CM | POA: Diagnosis not present

## 2017-05-14 ENCOUNTER — Other Ambulatory Visit: Payer: Self-pay | Admitting: Internal Medicine

## 2017-05-16 ENCOUNTER — Encounter: Payer: Self-pay | Admitting: Internal Medicine

## 2017-05-16 ENCOUNTER — Encounter: Payer: Self-pay | Admitting: Family Medicine

## 2017-05-17 HISTORY — PX: ROTATOR CUFF REPAIR: SHX139

## 2017-05-19 MED ORDER — HYOSCYAMINE SULFATE 0.125 MG SL SUBL: SUBLINGUAL_TABLET | SUBLINGUAL | 11 refills | Status: DC

## 2017-05-19 NOTE — Telephone Encounter (Signed)
Please advise, is patient taking as a maintenance medication?

## 2017-05-25 ENCOUNTER — Other Ambulatory Visit: Payer: Self-pay | Admitting: Family Medicine

## 2017-05-25 DIAGNOSIS — Y999 Unspecified external cause status: Secondary | ICD-10-CM | POA: Diagnosis not present

## 2017-05-25 DIAGNOSIS — M659 Synovitis and tenosynovitis, unspecified: Secondary | ICD-10-CM | POA: Diagnosis not present

## 2017-05-25 DIAGNOSIS — S46111A Strain of muscle, fascia and tendon of long head of biceps, right arm, initial encounter: Secondary | ICD-10-CM | POA: Diagnosis not present

## 2017-05-25 DIAGNOSIS — S46011A Strain of muscle(s) and tendon(s) of the rotator cuff of right shoulder, initial encounter: Secondary | ICD-10-CM | POA: Diagnosis not present

## 2017-05-25 DIAGNOSIS — S46191A Other injury of muscle, fascia and tendon of long head of biceps, right arm, initial encounter: Secondary | ICD-10-CM | POA: Diagnosis not present

## 2017-05-25 DIAGNOSIS — G8918 Other acute postprocedural pain: Secondary | ICD-10-CM | POA: Diagnosis not present

## 2017-05-25 DIAGNOSIS — S46011D Strain of muscle(s) and tendon(s) of the rotator cuff of right shoulder, subsequent encounter: Secondary | ICD-10-CM | POA: Diagnosis not present

## 2017-05-25 DIAGNOSIS — M7541 Impingement syndrome of right shoulder: Secondary | ICD-10-CM | POA: Diagnosis not present

## 2017-05-25 NOTE — Telephone Encounter (Signed)
Patient cancelled appointment. Records will be in "records reviewed" folder.

## 2017-05-28 ENCOUNTER — Telehealth: Payer: Self-pay

## 2017-05-28 NOTE — Telephone Encounter (Signed)
Spoke with patient and she stated that she just had surgery on her shoulder and would like for [place] to hold off on the prolia injection until she has had a chance to speak to her surgeon about the dangers of prolia post surgery- I agreed to call her back mid august to revisit this

## 2017-06-11 ENCOUNTER — Encounter: Payer: Medicare Other | Admitting: Gastroenterology

## 2017-06-11 DIAGNOSIS — Z9889 Other specified postprocedural states: Secondary | ICD-10-CM | POA: Diagnosis not present

## 2017-07-03 DIAGNOSIS — M25511 Pain in right shoulder: Secondary | ICD-10-CM | POA: Diagnosis not present

## 2017-07-07 DIAGNOSIS — M75101 Unspecified rotator cuff tear or rupture of right shoulder, not specified as traumatic: Secondary | ICD-10-CM | POA: Diagnosis not present

## 2017-07-07 DIAGNOSIS — M25611 Stiffness of right shoulder, not elsewhere classified: Secondary | ICD-10-CM | POA: Diagnosis not present

## 2017-07-07 DIAGNOSIS — M79621 Pain in right upper arm: Secondary | ICD-10-CM | POA: Diagnosis not present

## 2017-07-09 DIAGNOSIS — M25611 Stiffness of right shoulder, not elsewhere classified: Secondary | ICD-10-CM | POA: Diagnosis not present

## 2017-07-09 DIAGNOSIS — M75101 Unspecified rotator cuff tear or rupture of right shoulder, not specified as traumatic: Secondary | ICD-10-CM | POA: Diagnosis not present

## 2017-07-09 DIAGNOSIS — M79621 Pain in right upper arm: Secondary | ICD-10-CM | POA: Diagnosis not present

## 2017-07-10 ENCOUNTER — Other Ambulatory Visit: Payer: Self-pay | Admitting: Internal Medicine

## 2017-07-13 DIAGNOSIS — M79621 Pain in right upper arm: Secondary | ICD-10-CM | POA: Diagnosis not present

## 2017-07-13 DIAGNOSIS — M25611 Stiffness of right shoulder, not elsewhere classified: Secondary | ICD-10-CM | POA: Diagnosis not present

## 2017-07-13 DIAGNOSIS — M75101 Unspecified rotator cuff tear or rupture of right shoulder, not specified as traumatic: Secondary | ICD-10-CM | POA: Diagnosis not present

## 2017-07-13 NOTE — Telephone Encounter (Signed)
Pt is requesting a larger supply, please advise.

## 2017-07-17 DIAGNOSIS — M75101 Unspecified rotator cuff tear or rupture of right shoulder, not specified as traumatic: Secondary | ICD-10-CM | POA: Diagnosis not present

## 2017-07-17 DIAGNOSIS — M79621 Pain in right upper arm: Secondary | ICD-10-CM | POA: Diagnosis not present

## 2017-07-17 DIAGNOSIS — M25611 Stiffness of right shoulder, not elsewhere classified: Secondary | ICD-10-CM | POA: Diagnosis not present

## 2017-07-21 DIAGNOSIS — M79621 Pain in right upper arm: Secondary | ICD-10-CM | POA: Diagnosis not present

## 2017-07-21 DIAGNOSIS — M25611 Stiffness of right shoulder, not elsewhere classified: Secondary | ICD-10-CM | POA: Diagnosis not present

## 2017-07-21 DIAGNOSIS — M75101 Unspecified rotator cuff tear or rupture of right shoulder, not specified as traumatic: Secondary | ICD-10-CM | POA: Diagnosis not present

## 2017-07-24 DIAGNOSIS — M25611 Stiffness of right shoulder, not elsewhere classified: Secondary | ICD-10-CM | POA: Diagnosis not present

## 2017-07-24 DIAGNOSIS — M79621 Pain in right upper arm: Secondary | ICD-10-CM | POA: Diagnosis not present

## 2017-07-24 DIAGNOSIS — M75101 Unspecified rotator cuff tear or rupture of right shoulder, not specified as traumatic: Secondary | ICD-10-CM | POA: Diagnosis not present

## 2017-07-27 DIAGNOSIS — M25611 Stiffness of right shoulder, not elsewhere classified: Secondary | ICD-10-CM | POA: Diagnosis not present

## 2017-07-27 DIAGNOSIS — M75101 Unspecified rotator cuff tear or rupture of right shoulder, not specified as traumatic: Secondary | ICD-10-CM | POA: Diagnosis not present

## 2017-07-27 DIAGNOSIS — M79621 Pain in right upper arm: Secondary | ICD-10-CM | POA: Diagnosis not present

## 2017-08-04 DIAGNOSIS — M25611 Stiffness of right shoulder, not elsewhere classified: Secondary | ICD-10-CM | POA: Diagnosis not present

## 2017-08-04 DIAGNOSIS — M79621 Pain in right upper arm: Secondary | ICD-10-CM | POA: Diagnosis not present

## 2017-08-04 DIAGNOSIS — M75101 Unspecified rotator cuff tear or rupture of right shoulder, not specified as traumatic: Secondary | ICD-10-CM | POA: Diagnosis not present

## 2017-08-06 DIAGNOSIS — M79621 Pain in right upper arm: Secondary | ICD-10-CM | POA: Diagnosis not present

## 2017-08-06 DIAGNOSIS — M25611 Stiffness of right shoulder, not elsewhere classified: Secondary | ICD-10-CM | POA: Diagnosis not present

## 2017-08-06 DIAGNOSIS — M75101 Unspecified rotator cuff tear or rupture of right shoulder, not specified as traumatic: Secondary | ICD-10-CM | POA: Diagnosis not present

## 2017-08-11 ENCOUNTER — Other Ambulatory Visit: Payer: Self-pay | Admitting: Emergency Medicine

## 2017-08-11 DIAGNOSIS — M25611 Stiffness of right shoulder, not elsewhere classified: Secondary | ICD-10-CM | POA: Diagnosis not present

## 2017-08-11 DIAGNOSIS — M79621 Pain in right upper arm: Secondary | ICD-10-CM | POA: Diagnosis not present

## 2017-08-11 DIAGNOSIS — M75101 Unspecified rotator cuff tear or rupture of right shoulder, not specified as traumatic: Secondary | ICD-10-CM | POA: Diagnosis not present

## 2017-08-11 MED ORDER — HYOSCYAMINE SULFATE 0.125 MG SL SUBL: SUBLINGUAL_TABLET | SUBLINGUAL | 0 refills | Status: DC

## 2017-08-14 DIAGNOSIS — M25611 Stiffness of right shoulder, not elsewhere classified: Secondary | ICD-10-CM | POA: Diagnosis not present

## 2017-08-14 DIAGNOSIS — Z9889 Other specified postprocedural states: Secondary | ICD-10-CM | POA: Diagnosis not present

## 2017-08-14 DIAGNOSIS — M79621 Pain in right upper arm: Secondary | ICD-10-CM | POA: Diagnosis not present

## 2017-08-14 DIAGNOSIS — M75101 Unspecified rotator cuff tear or rupture of right shoulder, not specified as traumatic: Secondary | ICD-10-CM | POA: Diagnosis not present

## 2017-08-18 ENCOUNTER — Telehealth: Payer: Self-pay

## 2017-08-18 DIAGNOSIS — M75101 Unspecified rotator cuff tear or rupture of right shoulder, not specified as traumatic: Secondary | ICD-10-CM | POA: Diagnosis not present

## 2017-08-18 DIAGNOSIS — M79621 Pain in right upper arm: Secondary | ICD-10-CM | POA: Diagnosis not present

## 2017-08-18 DIAGNOSIS — M25611 Stiffness of right shoulder, not elsewhere classified: Secondary | ICD-10-CM | POA: Diagnosis not present

## 2017-08-18 NOTE — Telephone Encounter (Signed)
Left vm for patient to call me to schedule her Prolia injection- out-of-pocket is 0 and no PA needed so she is ready to be scheduled

## 2017-08-20 DIAGNOSIS — H43811 Vitreous degeneration, right eye: Secondary | ICD-10-CM | POA: Diagnosis not present

## 2017-08-20 DIAGNOSIS — H524 Presbyopia: Secondary | ICD-10-CM | POA: Diagnosis not present

## 2017-08-20 DIAGNOSIS — H2513 Age-related nuclear cataract, bilateral: Secondary | ICD-10-CM | POA: Diagnosis not present

## 2017-08-20 DIAGNOSIS — H43391 Other vitreous opacities, right eye: Secondary | ICD-10-CM | POA: Diagnosis not present

## 2017-08-20 LAB — HM DIABETES EYE EXAM

## 2017-08-21 DIAGNOSIS — M79621 Pain in right upper arm: Secondary | ICD-10-CM | POA: Diagnosis not present

## 2017-08-21 DIAGNOSIS — M75101 Unspecified rotator cuff tear or rupture of right shoulder, not specified as traumatic: Secondary | ICD-10-CM | POA: Diagnosis not present

## 2017-08-21 DIAGNOSIS — M25611 Stiffness of right shoulder, not elsewhere classified: Secondary | ICD-10-CM | POA: Diagnosis not present

## 2017-08-24 DIAGNOSIS — Z23 Encounter for immunization: Secondary | ICD-10-CM | POA: Diagnosis not present

## 2017-08-25 DIAGNOSIS — M75101 Unspecified rotator cuff tear or rupture of right shoulder, not specified as traumatic: Secondary | ICD-10-CM | POA: Diagnosis not present

## 2017-08-25 DIAGNOSIS — M25611 Stiffness of right shoulder, not elsewhere classified: Secondary | ICD-10-CM | POA: Diagnosis not present

## 2017-08-25 DIAGNOSIS — M79621 Pain in right upper arm: Secondary | ICD-10-CM | POA: Diagnosis not present

## 2017-08-27 ENCOUNTER — Telehealth: Payer: Self-pay

## 2017-08-27 ENCOUNTER — Encounter: Payer: Self-pay | Admitting: Internal Medicine

## 2017-08-27 ENCOUNTER — Encounter: Payer: Self-pay | Admitting: Family Medicine

## 2017-08-27 NOTE — Telephone Encounter (Signed)
Spoke to the patient's husband and requested he have the patient call me back to discuss Prolia

## 2017-08-27 NOTE — Telephone Encounter (Signed)
Spoke to patient today and she has decided she is not interested in Prolia at this time due to jaw and teeth issues- I requested she let Dr. Cruzita Lederer know if she changes her mind

## 2017-08-31 DIAGNOSIS — M75101 Unspecified rotator cuff tear or rupture of right shoulder, not specified as traumatic: Secondary | ICD-10-CM | POA: Diagnosis not present

## 2017-08-31 DIAGNOSIS — M79621 Pain in right upper arm: Secondary | ICD-10-CM | POA: Diagnosis not present

## 2017-08-31 DIAGNOSIS — M25611 Stiffness of right shoulder, not elsewhere classified: Secondary | ICD-10-CM | POA: Diagnosis not present

## 2017-09-02 DIAGNOSIS — M79621 Pain in right upper arm: Secondary | ICD-10-CM | POA: Diagnosis not present

## 2017-09-02 DIAGNOSIS — M25611 Stiffness of right shoulder, not elsewhere classified: Secondary | ICD-10-CM | POA: Diagnosis not present

## 2017-09-02 DIAGNOSIS — M75101 Unspecified rotator cuff tear or rupture of right shoulder, not specified as traumatic: Secondary | ICD-10-CM | POA: Diagnosis not present

## 2017-09-07 DIAGNOSIS — M79621 Pain in right upper arm: Secondary | ICD-10-CM | POA: Diagnosis not present

## 2017-09-07 DIAGNOSIS — M75101 Unspecified rotator cuff tear or rupture of right shoulder, not specified as traumatic: Secondary | ICD-10-CM | POA: Diagnosis not present

## 2017-09-07 DIAGNOSIS — M25611 Stiffness of right shoulder, not elsewhere classified: Secondary | ICD-10-CM | POA: Diagnosis not present

## 2017-09-11 DIAGNOSIS — M75101 Unspecified rotator cuff tear or rupture of right shoulder, not specified as traumatic: Secondary | ICD-10-CM | POA: Diagnosis not present

## 2017-09-11 DIAGNOSIS — M79621 Pain in right upper arm: Secondary | ICD-10-CM | POA: Diagnosis not present

## 2017-09-11 DIAGNOSIS — M25611 Stiffness of right shoulder, not elsewhere classified: Secondary | ICD-10-CM | POA: Diagnosis not present

## 2017-09-16 DIAGNOSIS — M75101 Unspecified rotator cuff tear or rupture of right shoulder, not specified as traumatic: Secondary | ICD-10-CM | POA: Diagnosis not present

## 2017-09-16 DIAGNOSIS — M25611 Stiffness of right shoulder, not elsewhere classified: Secondary | ICD-10-CM | POA: Diagnosis not present

## 2017-09-16 DIAGNOSIS — M79621 Pain in right upper arm: Secondary | ICD-10-CM | POA: Diagnosis not present

## 2017-09-22 DIAGNOSIS — M75101 Unspecified rotator cuff tear or rupture of right shoulder, not specified as traumatic: Secondary | ICD-10-CM | POA: Diagnosis not present

## 2017-09-22 DIAGNOSIS — M25611 Stiffness of right shoulder, not elsewhere classified: Secondary | ICD-10-CM | POA: Diagnosis not present

## 2017-09-22 DIAGNOSIS — M79621 Pain in right upper arm: Secondary | ICD-10-CM | POA: Diagnosis not present

## 2017-09-23 DIAGNOSIS — Z09 Encounter for follow-up examination after completed treatment for conditions other than malignant neoplasm: Secondary | ICD-10-CM | POA: Diagnosis not present

## 2017-09-24 DIAGNOSIS — M79621 Pain in right upper arm: Secondary | ICD-10-CM | POA: Diagnosis not present

## 2017-09-24 DIAGNOSIS — M75101 Unspecified rotator cuff tear or rupture of right shoulder, not specified as traumatic: Secondary | ICD-10-CM | POA: Diagnosis not present

## 2017-09-24 DIAGNOSIS — M25611 Stiffness of right shoulder, not elsewhere classified: Secondary | ICD-10-CM | POA: Diagnosis not present

## 2017-09-29 DIAGNOSIS — M79621 Pain in right upper arm: Secondary | ICD-10-CM | POA: Diagnosis not present

## 2017-09-29 DIAGNOSIS — M75101 Unspecified rotator cuff tear or rupture of right shoulder, not specified as traumatic: Secondary | ICD-10-CM | POA: Diagnosis not present

## 2017-09-29 DIAGNOSIS — M25611 Stiffness of right shoulder, not elsewhere classified: Secondary | ICD-10-CM | POA: Diagnosis not present

## 2017-10-01 DIAGNOSIS — M25611 Stiffness of right shoulder, not elsewhere classified: Secondary | ICD-10-CM | POA: Diagnosis not present

## 2017-10-01 DIAGNOSIS — M75101 Unspecified rotator cuff tear or rupture of right shoulder, not specified as traumatic: Secondary | ICD-10-CM | POA: Diagnosis not present

## 2017-10-01 DIAGNOSIS — M79621 Pain in right upper arm: Secondary | ICD-10-CM | POA: Diagnosis not present

## 2017-10-05 DIAGNOSIS — M25611 Stiffness of right shoulder, not elsewhere classified: Secondary | ICD-10-CM | POA: Diagnosis not present

## 2017-10-05 DIAGNOSIS — M79621 Pain in right upper arm: Secondary | ICD-10-CM | POA: Diagnosis not present

## 2017-10-05 DIAGNOSIS — M75101 Unspecified rotator cuff tear or rupture of right shoulder, not specified as traumatic: Secondary | ICD-10-CM | POA: Diagnosis not present

## 2017-10-07 ENCOUNTER — Other Ambulatory Visit: Payer: Self-pay

## 2017-10-07 MED ORDER — GLUCOSE BLOOD VI STRP: ORAL_STRIP | 2 refills | Status: DC

## 2017-10-12 ENCOUNTER — Ambulatory Visit (INDEPENDENT_AMBULATORY_CARE_PROVIDER_SITE_OTHER): Payer: Medicare Other | Admitting: Internal Medicine

## 2017-10-12 ENCOUNTER — Encounter: Payer: Self-pay | Admitting: Internal Medicine

## 2017-10-12 VITALS — BP 130/80 | HR 68 | Wt 146.0 lb

## 2017-10-12 DIAGNOSIS — M810 Age-related osteoporosis without current pathological fracture: Secondary | ICD-10-CM

## 2017-10-12 DIAGNOSIS — E161 Other hypoglycemia: Secondary | ICD-10-CM

## 2017-10-12 DIAGNOSIS — E11649 Type 2 diabetes mellitus with hypoglycemia without coma: Secondary | ICD-10-CM | POA: Diagnosis not present

## 2017-10-12 LAB — POCT GLYCOSYLATED HEMOGLOBIN (HGB A1C): Hemoglobin A1C: 6.4

## 2017-10-12 MED ORDER — FREESTYLE LIBRE 14 DAY READER DEVI: 1.0000 | Freq: Once | 1 refills | Status: DC

## 2017-10-12 MED ORDER — FREESTYLE LIBRE 14 DAY SENSOR MISC
1.0000 | 11 refills | Status: DC
Start: 2017-10-12 — End: 2018-03-24

## 2017-10-12 NOTE — Progress Notes (Signed)
Patient ID: SHETARA LAUNER, female   DOB: Apr 24, 1946, 71 y.o.   MRN: [id number]  HPI: BRANDEY VANDALEN is a 71 y.o.-year-old female, returning for f/u for DM2, dx in ~2009 (or earlier), diet-controlled, with complications (reactive hypoglycemia), also osteoporosis.  Previously seen by Dr. Chalmers Cater. Last 06/2016.  Last visit with me 6 months ago.  She had rotator cuff sx in 05/2017 >> then PT - continues this. Dr. Tamera Punt.  She will have dental implants.  Reactive hypoglycemia:  Reviewed and attended hx: Pt described that has had hypoglycemia every pm for "ages". Sometimes: sweating, feeling hot, nausea, sometimes vomiting.  She may need to take a nap afterward.  She started to see Dr Roney Mans ([place]) >> hypoglycemic episodes = better. She had an EGD, Ba swallow test >> she had a stricture at the site of her previous hiatal hernia with food retention in the Esophagus long after she eats (procedure: Spring 2016). She had Esophageal dilation. She also had a capsule endoscopy.   Lowest CBG in the 50s.  She corrects these with OJ or honey sticks. Lunch is a sandwich/wrap + yoghurt at 1 pm and snack at 3 pm (cheese or PB+apples or PB crackers), supper at 7:30 pm.  She is still trying to get 10,000 steps in (has a fitbit), but not quite daily.  DM2:  Last hemoglobin A1c was: Lab Results  Component Value Date   HGBA1C 6.2 04/01/2017   HGBA1C 6.9 (H) 01/05/2017   HGBA1C 6.5 06/30/2016   She was on previously for her diabetes but she stopped that she was not feeling well on it. She would not want to restart.  Pt checks her sugars by CGM - Freestyle Libre: CGM parameters: - Average from CGM: 144+/-26  Time in range:  - very low (<54): 0% - low (54-70): 0% - normal range (70-180): 93% - high sugars (180-250): 7% - very high sugars (250-400): 0%  - am: 130-145 >> 132-160 >> 130-135 >> 140-150 - 2h after b'fast: n/c >> 140 >> n/c - before lunch: n/c >> 100-150, 198, 222 >> n/c >>  125-155 - after lunch: 180-200 then 68-100>> 115-189 >> 120s before snack >> 74, 130-170 - before dinner: n/c >> 115, 123-150, 179, 215 >> n/c >> 130-160 - 2h after dinner: n/c >> 49x1, 61, 64, 74-158, 167 >> n/c >> 135-160 - bedtime: n/c  - nighttime: n/c Lowest sugar was 40s >> 68 >> 49 (delayed dinner, had no sxs) >> 50s >> 61. She has hypoglycemia awareness at  46s.  Highest sugar was 160s >> 205.  Glucometer: 4: OneTouch Verio and Freestyle Lite  She saw our nutritionist, Antonieta Iba. Last visit 02/2015.  -No CKD, last BUN/creatinine:  Lab Results  Component Value Date   BUN 12 04/01/2017   CREATININE 0.71 04/01/2017   - + HL; last set of lipids: Lab Results  Component Value Date   CHOL 153 01/05/2017   HDL 53.10 01/05/2017   LDLCALC 68 01/05/2017   TRIG 161.0 (H) 01/05/2017   CHOLHDL 3 01/05/2017  On Lipitor.  We reviewed her DXA scan report (02/08/2017) together >> osteopenia, however, as she has a history of 2 fractures, this would qualify as osteoporosis:  Lumbar spine (L1-L4) Femoral neck (FN)  T-score 0.6 RFN:-2.0 LFN:-1.6  Change in BMD from previous DXA test (%) Down 2.0% Down 4.3%   She has a history of a humeral fracture in 01/2017 (however, this was not seen on an x-ray...).  At  last visit, I suggested Prolia and we discussed about benefits of possible side effects.  She ended up refusing the medication due to fear of side effects.  At this visit, she tells me that she only wanted to wait until dental work is complete and then decide.  She has an appointment with the oral surgeon tomorrow.  Also, she had a slightly low vitamin D >> now on a supplement.  Lab Results  Component Value Date   VD25OH 28.68 (L) 03/04/2017    ROS: Constitutional: no weight gain/no weight loss, + fatigue, no subjective hyperthermia, no subjective hypothermia Eyes: no blurry vision, no xerophthalmia ENT: no sore throat, no nodules palpated in throat, no dysphagia, no  odynophagia, no hoarseness Cardiovascular: no CP/no SOB/no palpitations/no leg swelling Respiratory: no cough/no SOB/no wheezing Gastrointestinal: no N/no V/no D/no C/no acid reflux Musculoskeletal: no muscle aches/+ joint aches Skin: no rashes, no hair loss Neurological: no tremors/no numbness/no tingling/no dizziness  I reviewed pt's medications, allergies, PMH, social hx, family hx, and changes were documented in the history of present illness. Otherwise, unchanged from my initial visit note.  History   Social History  . Marital Status: Married    Spouse Name: N/A  . Number of Children: 2   Social History Main Topics  . Smoking status: Former Smoker    Types: Cigarettes    Quit date: 11/17/1980  . Smokeless tobacco: Never Used     Comment: smoked 1962-1982, up to 1 ppd  . Alcohol Use: 1.2 oz/week    2 Glasses of wine per week     Comment: socially,2 / week  . Drug Use: No  . Sexual Activity: Yes    Birth Control/ Protection: Post-menopausal   Social History Narrative   Daily caffeine use   Married   Veterinary surgeon intermittently   Past Medical History:  Diagnosis Date  . Abnormal finding on EKG    non specific T changes; short PR  . Arthritis   . Bursitis    tronchanteric  . Complication of anesthesia    Severe PONV  . Diabetes mellitus without complication ([place])    Pt states she is pre-diabetic (12-06-13)  . Diverticulosis   . Gastroparesis    93 % retenton at 2 hrs  . GERD (gastroesophageal reflux disease)   . H/O dilation and curettage   . Hernia    hiatal  . Hiatal hernia   . Hyperlipidemia   . Hyperlipidemia   . hypoglycemia   . 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 CARDIAC CATHETERIZATION  2005   < 30 % lesion  . COLONOSCOPY W/ POLYPECTOMY  2006 ,2007 & 2015   negative 2009; Dr Fuller Plan  . DILATATION & CURETTAGE/HYSTEROSCOPY WITH TRUECLEAR N/A 01/11/2014    Procedure: DILATATION & CURETTAGE/HYSTEROSCOPY WITH TRUCLEAR;  Surgeon: Anastasio Auerbach, MD;  Location: [facility];  Service: Gynecology;  Laterality: N/A;  . ESOPHAGEAL MANOMETRY  03/22/15   Decreased peristalsis  . Esophagram  03/29/15   Persistent narrowing at site of hiatal hernia; proximal dilation  . G 3 P 3    . HIATAL HERNIA REPAIR  2000  . LEG SURGERY     Trauma LLE/pins and plates  . myomectomy  1999   uterine fibroid  . TUBAL LIGATION    . Upper endoscopy and esophageal dilation  02/20/15   Dr Roney Mans , [facility]   Current Outpatient Medications on File  Prior to Visit  Medication Sig Dispense Refill  . acetaminophen (TYLENOL ARTHRITIS PAIN) 650 MG CR tablet Take 650 mg by mouth. 2 by mouth 2 times daily     . aspirin 81 MG chewable tablet Chew 81 mg by mouth daily.    Marland Kitchen atorvastatin (LIPITOR) 40 MG tablet TAKE 1 TABLET BY MOUTH DAILY 90 tablet 3  . Calcium-Vitamin D-Vitamin K (VIACTIV PO) Take by mouth. Reported on 02/28/2016    . Continuous Blood Gluc Receiver (FREESTYLE LIBRE READER) DEVI 1 Device by Does not apply route 3 (three) times daily. 1 Device 1  . Continuous Blood Gluc Sensor (FREESTYLE LIBRE SENSOR SYSTEM) MISC 1 Device by Does not apply route every 30 (thirty) days. 3 each 11  . esomeprazole (NEXIUM) 40 MG capsule Take 1 capsule (40 mg total) by mouth daily. --- Needs office visit before further refills   30 capsule 2  . fexofenadine (ALLEGRA) 180 MG tablet Take 180 mg by mouth daily.      Marland Kitchen glucose blood (FREESTYLE PRECISION NEO TEST) test strip Use as instructed to check sugars 3 times daily as needed 300 each 5  . glucose blood (ONETOUCH VERIO) test strip Use as instructed to check sugar 3 times daily if [employer]. E11.649 300 each 2  . hydrochlorothiazide (MICROZIDE) 12.5 MG capsule Take 1 capsule (12.5 mg total) by mouth daily. 90 capsule 3  . hyoscyamine (LEVSIN SL) 0.125 MG SL tablet DISSOLVE ONE OR TWO TABLETS UNDER TONGUE EVERY 4-6 HOURS AS NEEDED FOR  ABDOMINAL PAIN AND CRAMPING 60 tablet 11  . hyoscyamine (LEVSIN SL) 0.125 MG SL tablet DISSOLVE ONE OR TWO TABLETS UNDER TONGUE EVERY 4-6 HOURS AS NEEDED FOR ABDOMINAL PAIN AND CRAMPING 900 tablet 0  . Lancets MISC by Does not apply route. Lifespan ultra 2 test strips and lancets     . meclizine (ANTIVERT) 12.5 MG tablet Take 1-2 tablets (12.5-25 mg total) by mouth 3 (three) times daily as needed for dizziness. 60 tablet 0  . montelukast (SINGULAIR) 10 MG tablet 1 qd prn 90 tablet 3  . OVER THE COUNTER MEDICATION multistrain probiotic    . PENNSAID 2 % SOLN APPLY TWICE DAILY (Patient taking differently: APPLY TWICE DAILY PRN) 112 g 1  . promethazine (PHENERGAN) 25 MG tablet Take 1 tablet (25 mg total) by mouth every 8 (eight) hours as needed for nausea or vomiting. 30 tablet 0  . Vitamin D, Ergocalciferol, (DRISDOL) 50000 units CAPS capsule TAKE ONE CAPSULE BY MOUTH EVERY 7 DAYS 12 capsule 0   No current facility-administered medications on file prior to visit.    Allergies  Allergen Reactions  . Domperidone     Stomach cramps  . Fentanyl Nausea And Vomiting  . Monistat [Miconazole Nitrate]     SEVERE BURNING  . Nsaids     REACTION: stomach complaints  . Oxycodone Hcl     REACTION: VOMITTING  . Codeine     nausea  . Erythromycin Ethylsuccinate     gastritis   Family History  Problem Relation Age of Onset  . Ulcers Father   . Diabetes Father   . Hypertension Father   . Heart attack Father        > 35  . Stroke Father        in 76s  . Heart attack Mother 23  . Diabetes Sister   . Heart attack Sister 35       CABG X 4  . Cancer Neg Hx   .  Colitis Neg Hx    PE: BP 130/80 (BP Location: Left Arm, Patient Position: Sitting)   Pulse 68   Wt 146 lb (66.2 kg)   LMP 11/18/2000   SpO2 99%   BMI 25.86 kg/m  Body mass index is 25.86 kg/m. Wt Readings from Last 3 Encounters:  10/12/17 146 lb (66.2 kg)  04/01/17 147 lb (66.7 kg)  03/04/17 151 lb (68.5 kg)   Constitutional:  normal weight, in NAD Eyes: PERRLA, EOMI, no exophthalmos ENT: moist mucous membranes, no thyromegaly, no cervical lymphadenopathy Cardiovascular: RRR, No MRG Respiratory: CTA B Gastrointestinal: abdomen soft, NT, ND, BS+ Musculoskeletal: no deformities, strength intact in all 4 Skin: moist, warm, no rashes Neurological: no tremor with outstretched hands, DTR normal in all 4  ASSESSMENT: 1. Diet-controlled DM2, without complications.  2. Hypoglycemia - possibly reactive - improved - we r/o Adrenal insufficiency: Component     Latest Ref Rng 02/02/2015 02/02/2015 02/02/2015         9:21 AM 10:02 AM 10:02 AM  Cortisol, Plasma      7.1 29.1 24.2  C206 ACTH     6 - 50 pg/mL 12     PLAN:  1. DM2, fairly well controlled - Diet controlled - She has an occasional hyperglycemic spike after a meal, however, these are not frequently higher than 180.  She also has an occasional drop in blood sugars in the 60s, usually after a hyperglycemic event.  She tells me that her meals do not process normally and this may be the reason why her sugars are so fluctuating after a meal.  We analyzed the CGM traces and discussed about her fluctuating blood sugars >> she feels that some of her peaks are due to or issues.  We discussed about not using it or using smaller amounts.  I recommended glucose tablets instead. - HbA1c level today is 6.4% (slightly higher, but still at goal)  2. Patient with history of reactive (postprandial) hypoglycemia - Please see above - Her postprandial hypoglycemia episodes improved after her esophageal stretching; since last visit, she did not have any 50s readings, lowest 61 - Usually the low blood sugars after dinner, as this is a larger meal that she may eat out - Again discussed to avoid liquids with meals   3. Osteopenia + fractures - Likely postmenopausal - Discussed about increased risk of fracture, depending on the T score, greatly increased when the T score is lower  than -2.5, but it is actually a continuum and -2.5 should not be regarded as an absolute threshold.  We reviewed her latest DXA scan report together and I explained that based on the T-scores she is at high risk for fractures especially at the hips - At last visit, we started the PA for Prolia, however, she refused because of jaw and teeth issues.  She would now want to reconsider this especially after she talks with the dental surgeon.  She will talk to him tomorrow and we discussed that we can start Prolia after she is done with her dental work.  She m will let me know about the timeline.   Philemon Kingdom, MD PhD [facility]

## 2017-10-12 NOTE — Addendum Note (Signed)
Addended by: Caprice Beaver T on: 10/12/2017 02:17 PM   Modules accepted: Orders

## 2017-10-12 NOTE — Patient Instructions (Signed)
Please continue to check sugars as needed.  Please let me know if we can start Prolia.   Please come back in 6 months.

## 2017-10-27 DIAGNOSIS — M75101 Unspecified rotator cuff tear or rupture of right shoulder, not specified as traumatic: Secondary | ICD-10-CM | POA: Diagnosis not present

## 2017-10-27 DIAGNOSIS — M79621 Pain in right upper arm: Secondary | ICD-10-CM | POA: Diagnosis not present

## 2017-10-27 DIAGNOSIS — M25611 Stiffness of right shoulder, not elsewhere classified: Secondary | ICD-10-CM | POA: Diagnosis not present

## 2017-10-28 ENCOUNTER — Other Ambulatory Visit: Payer: Self-pay | Admitting: Internal Medicine

## 2017-11-13 DIAGNOSIS — Z09 Encounter for follow-up examination after completed treatment for conditions other than malignant neoplasm: Secondary | ICD-10-CM | POA: Diagnosis not present

## 2017-11-25 ENCOUNTER — Ambulatory Visit: Payer: Medicare Other

## 2017-11-25 ENCOUNTER — Encounter: Payer: Self-pay | Admitting: Internal Medicine

## 2017-11-25 ENCOUNTER — Ambulatory Visit (INDEPENDENT_AMBULATORY_CARE_PROVIDER_SITE_OTHER): Payer: Medicare Other | Admitting: Internal Medicine

## 2017-11-25 VITALS — BP 152/90 | HR 64 | Temp 97.8°F | Resp 16 | Wt 145.0 lb

## 2017-11-25 DIAGNOSIS — E7849 Other hyperlipidemia: Secondary | ICD-10-CM

## 2017-11-25 DIAGNOSIS — I10 Essential (primary) hypertension: Secondary | ICD-10-CM | POA: Diagnosis not present

## 2017-11-25 DIAGNOSIS — E11649 Type 2 diabetes mellitus with hypoglycemia without coma: Secondary | ICD-10-CM

## 2017-11-25 DIAGNOSIS — K2100 Gastro-esophageal reflux disease with esophagitis, without bleeding: Secondary | ICD-10-CM

## 2017-11-25 MED ORDER — HYDROCHLOROTHIAZIDE 12.5 MG PO CAPS: 12.5000 mg | ORAL_CAPSULE | Freq: Every day | ORAL | 3 refills | Status: DC

## 2017-11-25 MED ORDER — ATORVASTATIN CALCIUM 40 MG PO TABS: ORAL_TABLET | ORAL | 3 refills | Status: DC

## 2017-11-25 NOTE — Assessment & Plan Note (Signed)
Check lipid panel  Continue daily statin Regular exercise and healthy diet encouraged  

## 2017-11-25 NOTE — Assessment & Plan Note (Addendum)
BP Readings from Last 3 Encounters:  11/25/17 (!) 152/90  10/12/17 130/80  05/11/17 140/82   BP slightly elevated today, but typically better controlled Continue current medication cmp

## 2017-11-25 NOTE — Assessment & Plan Note (Signed)
GERD controlled Continue daily medication  

## 2017-11-25 NOTE — Assessment & Plan Note (Signed)
Lab Results  Component Value Date   HGBA1C 6.4 10/12/2017    Management per Dr Cruzita Lederer Well controlled

## 2017-11-25 NOTE — Progress Notes (Signed)
Subjective:    Patient ID: Hannah Bryant, female    DOB: 31-Oct-1946, 72 y.o.   MRN: [id number]  HPI The patient is here for follow up.  Hypertension: She is taking her medication daily. She is compliant with a low sodium diet.  She denies chest pain, edema, shortness of breath and regular headaches. She is exercising regularly -walking.      Hyperlipidemia: She is taking her medication daily. She is compliant with a low fat/cholesterol diet. She is exercising regularly. She denies myalgias.   Diabetes: she follows with Dr Cruzita Lederer.  She is taking her medication daily as prescribed. She is compliant with a diabetic diet. She is exercising regularly. Her last a1c was 6.4. She checks her feet daily and denies foot lesions. She is up-to-date with an ophthalmology examination.   GERD:  She is taking her medication daily as prescribed.  She denies any GERD symptoms and feels her GERD is well controlled.    Medications and allergies reviewed with patient and updated if appropriate.  Patient Active Problem List   Diagnosis Date Noted  . Right rotator cuff tear 05/11/2017  . Cyst of joint of shoulder 03/04/2017  . Acute pain of right shoulder 02/06/2017  . Left sided abdominal pain 03/25/2016  . Type 2 diabetes mellitus with hypoglycemia without coma, without long-term current use of insulin ([place]) 02/28/2016  . Reactive hypoglycemia 02/28/2016  . Retrocalcaneal bursitis 02/26/2016  . Osteoporosis 01/09/2016  . Essential hypertension, benign 01/09/2016  . Achilles tendinosis 06/22/2015  . Junctional nevus of left thigh 12/23/2014  . Recurrent abdominal pain 11/28/2014  . Left rotator cuff tear arthropathy 08/21/2014  . Squamous cell skin cancer 10/06/2013  . BENIGN POSITIONAL VERTIGO 08/27/2009  . NONSPECIFIC ABNORM RESULTS THYROID FUNCT STUDY 07/13/2009  . History of colonic polyps 05/09/2008  . Gastroparesis 11/15/2007  . DIVERTICULOSIS, COLON 11/15/2007  . GASTROESOPHAGEAL  REFLUX DISEASE, SEVERE 05/12/2007  . Dysphagia 05/12/2007  . Nonspecific abnormal electrocardiogram (ECG) (EKG) 05/12/2007  . Hyperlipidemia 05/07/2007    Current Outpatient Medications on File Prior to Visit  Medication Sig Dispense Refill  . acetaminophen (TYLENOL ARTHRITIS PAIN) 650 MG CR tablet Take 650 mg by mouth. 2 by mouth 2 times daily     . aspirin 81 MG chewable tablet Chew 81 mg by mouth daily.    Marland Kitchen atorvastatin (LIPITOR) 40 MG tablet TAKE 1 TABLET BY MOUTH DAILY 90 tablet 3  . Calcium-Vitamin D-Vitamin K (VIACTIV PO) Take by mouth. Reported on 02/28/2016    . Continuous Blood Gluc Sensor (FREESTYLE LIBRE 14 DAY SENSOR) MISC 1 each by Does not apply route every 14 (fourteen) days. Change every 2 weeks 2 each 11  . esomeprazole (NEXIUM) 40 MG capsule Take 1 capsule (40 mg total) by mouth daily. --- Needs office visit before further refills   30 capsule 2  . fexofenadine (ALLEGRA) 180 MG tablet Take 180 mg by mouth daily.      Marland Kitchen glucose blood (FREESTYLE PRECISION NEO TEST) test strip Use as instructed to check sugars 3 times daily as needed 300 each 5  . glucose blood (ONETOUCH VERIO) test strip Use as instructed to check sugar 3 times daily if [employer]. E11.649 300 each 2  . hydrochlorothiazide (MICROZIDE) 12.5 MG capsule Take 1 capsule (12.5 mg total) by mouth daily. 90 capsule 3  . hyoscyamine (LEVSIN SL) 0.125 MG SL tablet DISSOLVE ONE OR TWO TABLETS UNDER TONGUE EVERY 4-6 HOURS AS NEEDED FOR ABDOMINAL PAIN AND CRAMPING  300 tablet 0  . Lancets MISC by Does not apply route. Lifespan ultra 2 test strips and lancets     . meclizine (ANTIVERT) 12.5 MG tablet Take 1-2 tablets (12.5-25 mg total) by mouth 3 (three) times daily as needed for dizziness. 60 tablet 0  . montelukast (SINGULAIR) 10 MG tablet 1 qd prn 90 tablet 3  . OVER THE COUNTER MEDICATION multistrain probiotic    . promethazine (PHENERGAN) 25 MG tablet Take 1 tablet (25 mg total) by mouth every 8 (eight) hours  as needed for nausea or vomiting. 30 tablet 0   No current facility-administered medications on file prior to visit.     Past Medical History:  Diagnosis Date  . Abnormal finding on EKG    non specific T changes; short PR  . Arthritis   . Bursitis    tronchanteric  . Complication of anesthesia    Severe PONV  . Diabetes mellitus without complication ([place])    Pt states she is pre-diabetic (12-06-13)  . Diverticulosis   . Gastroparesis    93 % [place] at 2 hrs  . GERD (gastroesophageal reflux disease)   . H/O dilation and curettage   . Hernia    hiatal  . Hiatal hernia   . Hyperlipidemia   . Hyperlipidemia   . hypoglycemia   . 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 CARDIAC CATHETERIZATION  2005   < 30 % lesion  . COLONOSCOPY W/ POLYPECTOMY  2006 ,2007 & 2015   negative 2009; Dr Fuller Plan  . DILATATION & CURETTAGE/HYSTEROSCOPY WITH TRUECLEAR N/A 01/11/2014   Procedure: DILATATION & CURETTAGE/HYSTEROSCOPY WITH TRUCLEAR;  Surgeon: Anastasio Auerbach, MD;  Location: [facility];  Service: Gynecology;  Laterality: N/A;  . ESOPHAGEAL MANOMETRY  03/22/15   Decreased peristalsis  . Esophagram  03/29/15   Persistent narrowing at site of hiatal hernia; proximal dilation  . G 3 P 3    . HIATAL HERNIA REPAIR  2000  . LEG SURGERY     Trauma LLE/pins and plates  . myomectomy  1999   uterine fibroid  . TUBAL LIGATION    . Upper endoscopy and esophageal dilation  02/20/15   Dr Roney Mans , [facility]    Social History   Socioeconomic History  . Marital status: Married    Spouse name: None  . Number of children: 2  . Years of education: None  . Highest education level: None  Social Needs  . Financial resource strain: None  . Food insecurity - worry: None  . Food insecurity - inability: None  . Transportation needs - medical: None  . Transportation needs - non-medical: None  Occupational History    Employer:  UNEMPLOYED  Tobacco Use  . Smoking status: Former Smoker    Types: Cigarettes    Last attempt to quit: 11/17/1980    Years since quitting: 37.0  . Smokeless tobacco: Never Used  . Tobacco comment: smoked 1962-1982, up to 1 ppd  Substance and Sexual Activity  . Alcohol use: Yes    Alcohol/week: 1.2 oz    Types: 2 Glasses of wine per week    Comment: socially,2 / week  . Drug use: No  . Sexual activity: Yes    Birth control/protection: Post-menopausal  Other Topics Concern  . None  Social History Narrative   Daily caffeine use   Married   Veterinary surgeon intermittently    Family  History  Problem Relation Age of Onset  . Ulcers Father   . Diabetes Father   . Hypertension Father   . Heart attack Father        > 45  . Stroke Father        in 50s  . Heart attack Mother 59  . Diabetes Sister   . Heart attack Sister 31       CABG X 4  . Cancer Neg Hx   . Colitis Neg Hx     Review of Systems  Constitutional: Negative for chills, fatigue and fever.  Respiratory: Negative for cough, shortness of breath and wheezing.   Cardiovascular: Positive for palpitations (when sugars drop). Negative for chest pain and leg swelling.  Neurological: Negative for light-headedness and headaches.       Objective:   Vitals:   11/25/17 1116  BP: (!) 152/90  Pulse: 64  Resp: 16  Temp: 97.8 F (36.6 C)  SpO2: 98%   Wt Readings from Last 3 Encounters:  11/25/17 145 lb (65.8 kg)  10/12/17 146 lb (66.2 kg)  04/01/17 147 lb (66.7 kg)   Body mass index is 25.69 kg/m.   Physical Exam    Constitutional: Appears well-developed and well-nourished. No distress.  HENT:  Head: Normocephalic and atraumatic.  Neck: Neck supple. No tracheal deviation present. No thyromegaly present.  No cervical lymphadenopathy Cardiovascular: Normal rate, regular rhythm and normal heart sounds.   No murmur heard. No carotid bruit .  No edema Pulmonary/Chest: Effort normal and breath sounds  normal. No respiratory distress. No has no wheezes. No rales.  Skin: Skin is warm and dry. Not diaphoretic.  Psychiatric: Normal mood and affect. Behavior is normal.      Assessment & Plan:    See Problem List for Assessment and Plan of chronic medical problems.

## 2017-11-25 NOTE — Patient Instructions (Signed)

## 2017-11-30 ENCOUNTER — Other Ambulatory Visit: Payer: Self-pay | Admitting: Internal Medicine

## 2017-12-04 ENCOUNTER — Other Ambulatory Visit: Payer: Self-pay | Admitting: Internal Medicine

## 2017-12-08 ENCOUNTER — Ambulatory Visit: Payer: Medicare Other

## 2017-12-18 ENCOUNTER — Other Ambulatory Visit: Payer: Self-pay | Admitting: Internal Medicine

## 2017-12-30 ENCOUNTER — Telehealth: Payer: Self-pay | Admitting: Internal Medicine

## 2017-12-30 NOTE — Telephone Encounter (Signed)
Spoke with Hannah Bryant. Patient declined to schedule AWV.

## 2017-12-31 ENCOUNTER — Encounter: Payer: Self-pay | Admitting: Internal Medicine

## 2017-12-31 DIAGNOSIS — E11649 Type 2 diabetes mellitus with hypoglycemia without coma: Secondary | ICD-10-CM

## 2017-12-31 DIAGNOSIS — K2100 Gastro-esophageal reflux disease with esophagitis, without bleeding: Secondary | ICD-10-CM

## 2018-01-01 DIAGNOSIS — S40011A Contusion of right shoulder, initial encounter: Secondary | ICD-10-CM | POA: Diagnosis not present

## 2018-02-23 IMAGING — CT CT ABD-PELV W/ CM
2 of 5 series · 16 of 46 positions shown, 18 images · IV contrast (ISOVUE 300)
Comparison: None.

CLINICAL DATA: Left-sided intermittent abdominal pain for the last
several days. History of diverticulosis.

EXAM:
CT ABDOMEN AND PELVIS WITH CONTRAST
TECHNIQUE: Multidetector CT imaging of the abdomen and pelvis was performed
using the standard protocol following bolus administration of
intravenous contrast.
CONTRAST:  100mL GUFNGS-8BB IOPAMIDOL (GUFNGS-8BB) INJECTION 61%

[Series 2: abd/ pelvis · axial · 0.72mm/px · z∈[-422,-36]mm · 13 of 87 slices shown, 15 images]
[im 5/87  soft-tissue]
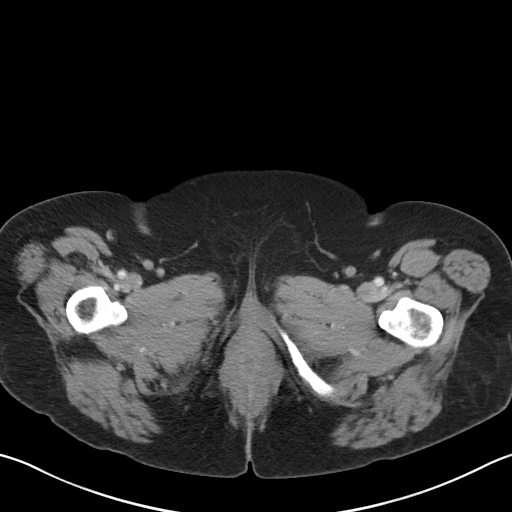
[im 5/87  bone]
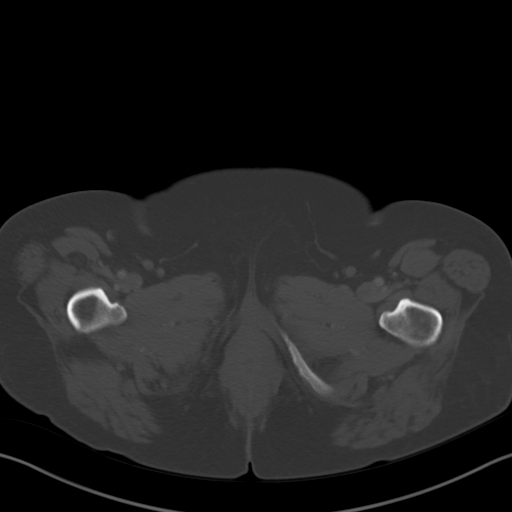
[im 13/87  soft-tissue]
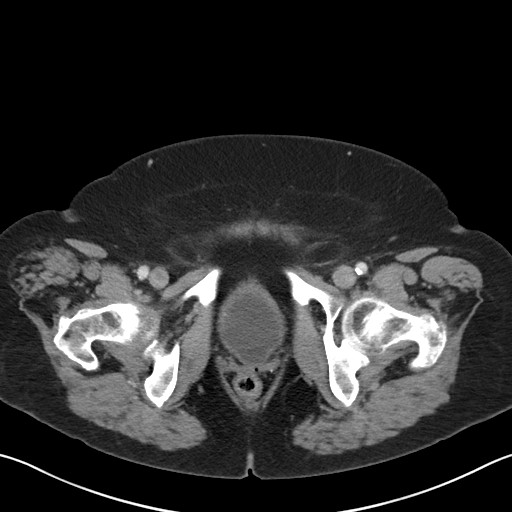
[im 18/87  soft-tissue]
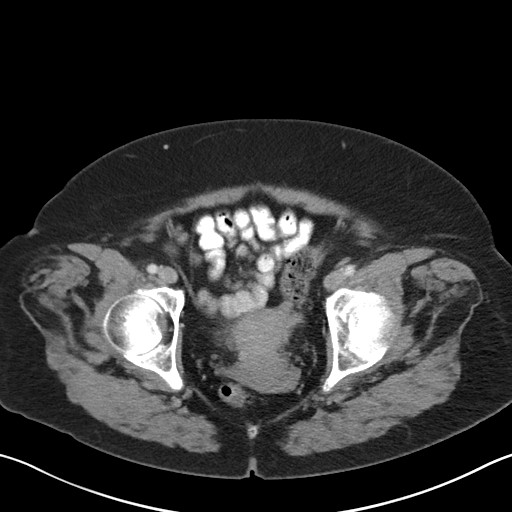
[im 26/87  soft-tissue]
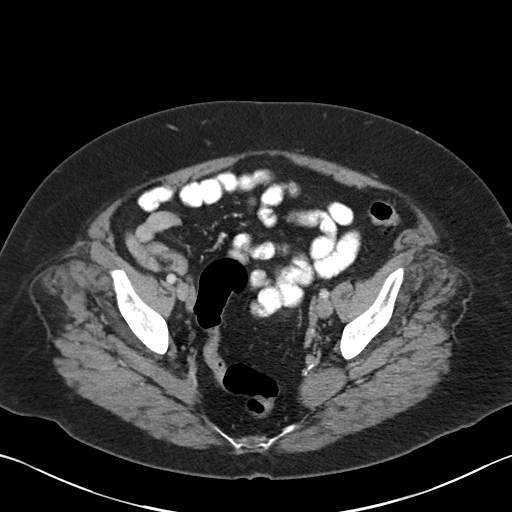
[im 31/87  soft-tissue]
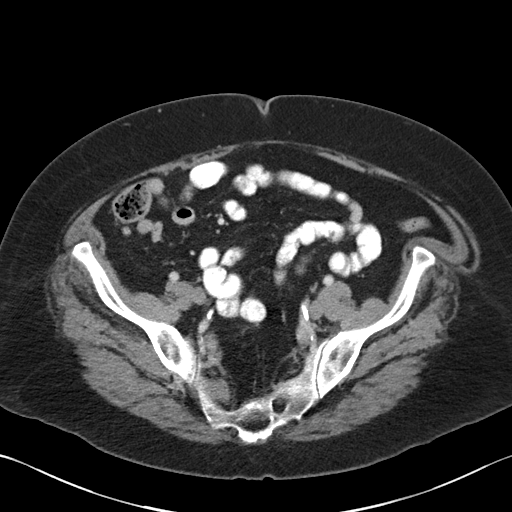
[im 39/87  soft-tissue]
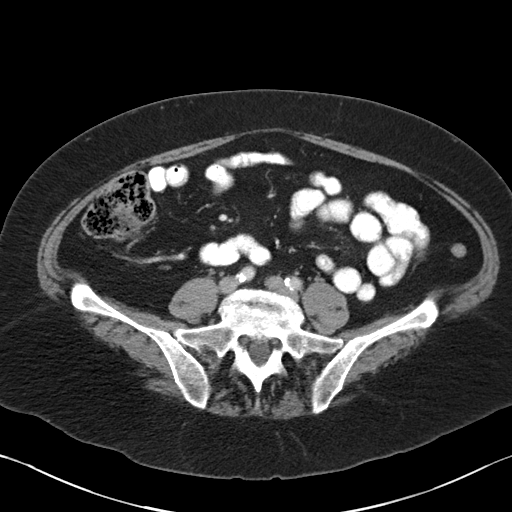
[im 44/87  soft-tissue]
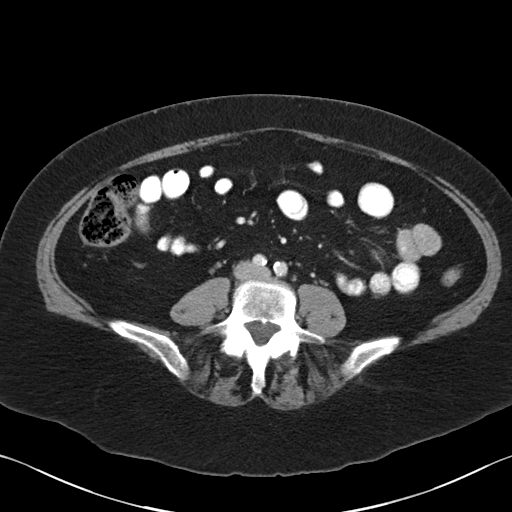
[im 48/87  soft-tissue]
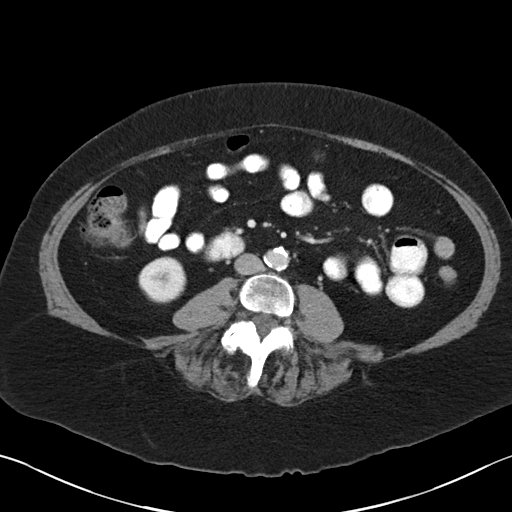
[im 56/87  soft-tissue]
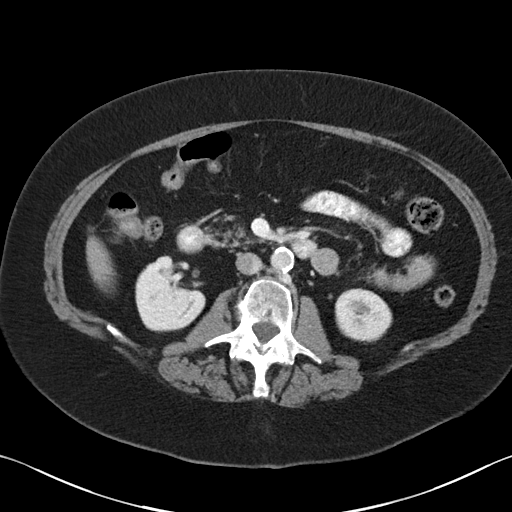
[im 56/87  bone]
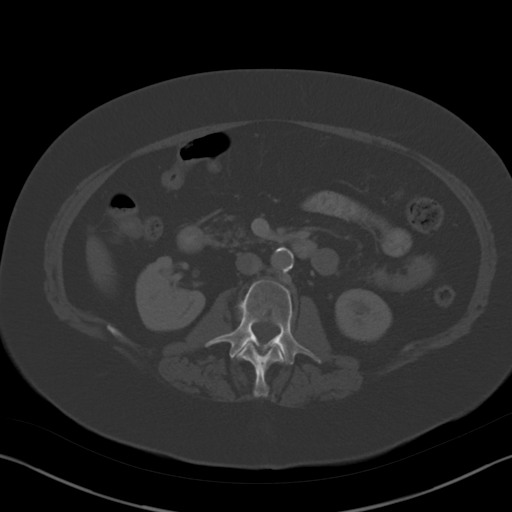
[im 61/87  soft-tissue]
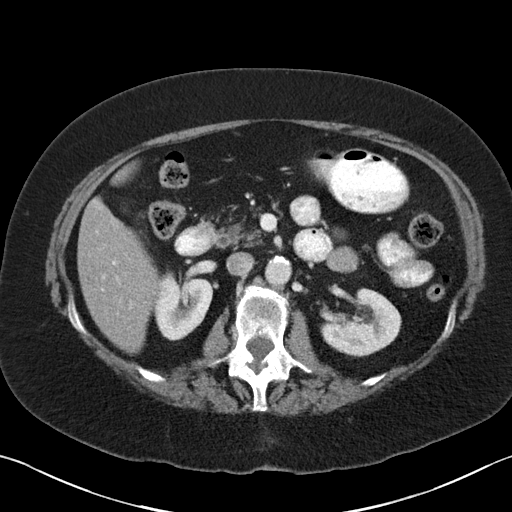
[im 69/87  soft-tissue]
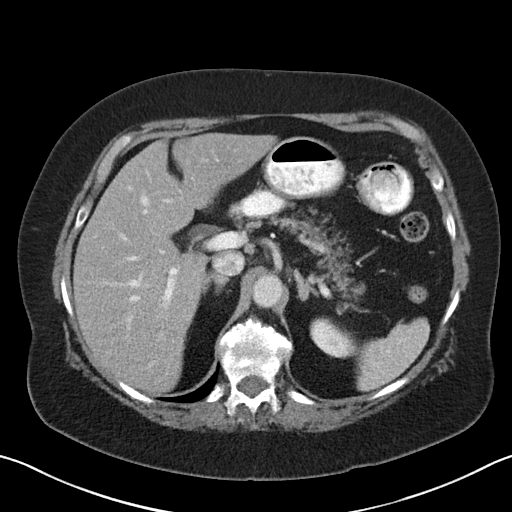
[im 74/87  soft-tissue]
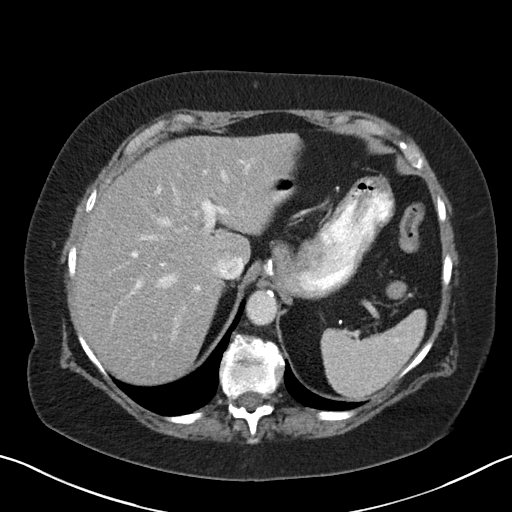
[im 82/87  soft-tissue]
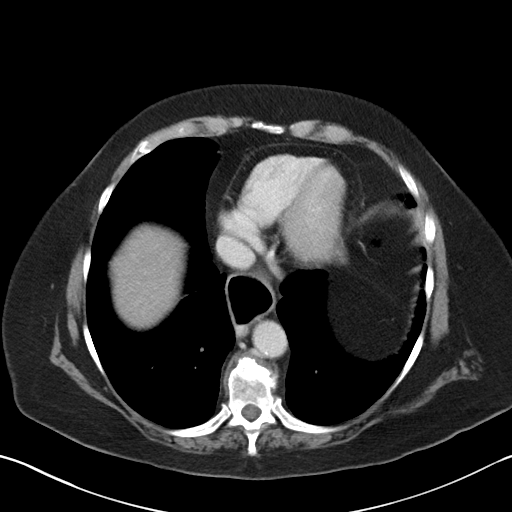

[Series 5: coronal soft tissue · coronal · 0.67mm/px · 3 of 84 slices shown]
[im 28/84  soft-tissue]
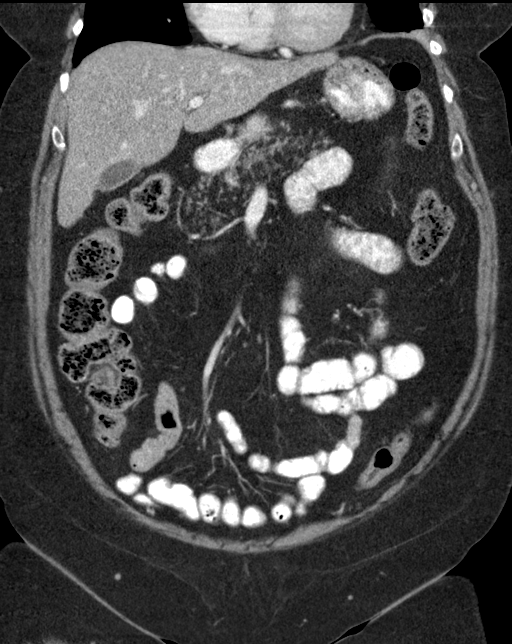
[im 37/84  soft-tissue]
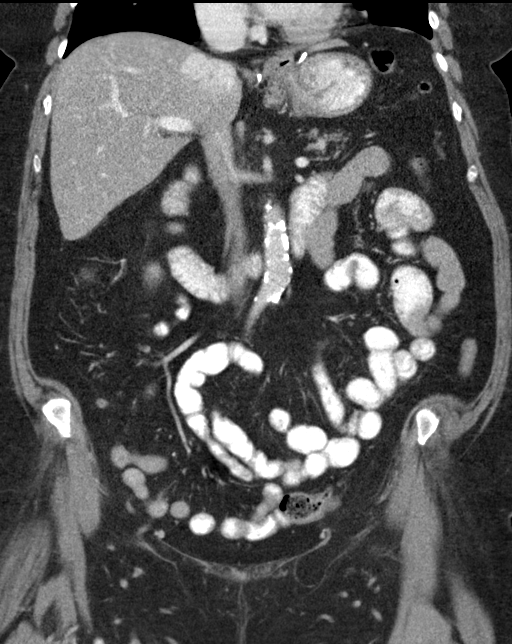
[im 47/84  soft-tissue]
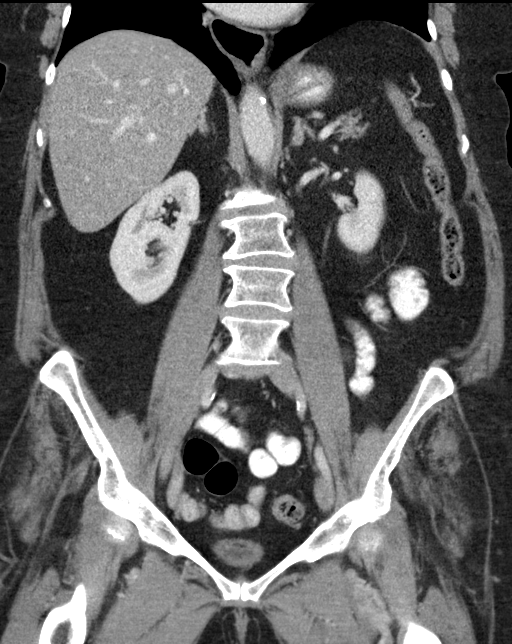

[16 of 46 positions shown; findings below may reference images not displayed]

FINDINGS: Lung bases:  Essentially clear.  Heart normal in size.

Hepatobiliary: Fatty infiltration of the liver. No liver mass or
focal lesion. Normal gallbladder. No bile duct dilation.

Spleen:  Unremarkable.

Pancreas: Partial fatty replacement. No mass or inflammatory change.

Adrenal glands:  No masses.

Kidneys, ureters, bladder:  Unremarkable.

Uterus and adnexa:  Unremarkable.

Lymph nodes:  No adenopathy.

Ascites:  None.

Gastrointestinal: There changes from previous stomach surgery.
Distal esophagus is mild-to-moderately distended. Normal small
bowel. There are scattered colonic diverticula most evident along
sigmoid colon. No diverticulitis. Colon otherwise unremarkable.
Normal appendix visualized.

Vascular: Atherosclerotic calcifications noted along the abdominal
aorta.

Musculoskeletal: Degenerative changes noted throughout the
visualized spine. No osteoblastic or osteolytic lesions.
IMPRESSION: 1. No acute findings within the abdomen or pelvis.
2. Colonic diverticula without diverticulitis.
3. Hepatic steatosis.

## 2018-03-01 ENCOUNTER — Ambulatory Visit: Payer: Self-pay | Admitting: Nurse Practitioner

## 2018-03-01 ENCOUNTER — Encounter: Payer: Self-pay | Admitting: Nurse Practitioner

## 2018-03-01 VITALS — BP 128/82 | HR 82 | Temp 99.1°F | Resp 18 | Wt 144.6 lb

## 2018-03-01 DIAGNOSIS — J069 Acute upper respiratory infection, unspecified: Secondary | ICD-10-CM

## 2018-03-01 DIAGNOSIS — R509 Fever, unspecified: Secondary | ICD-10-CM

## 2018-03-01 LAB — POCT INFLUENZA A/B
INFLUENZA A, POC: NEGATIVE
INFLUENZA B, POC: NEGATIVE

## 2018-03-01 MED ORDER — BENZONATATE 100 MG PO CAPS: 100.0000 mg | ORAL_CAPSULE | Freq: Two times a day (BID) | ORAL | 0 refills | Status: AC | PRN

## 2018-03-01 MED ORDER — ALBUTEROL SULFATE HFA 108 (90 BASE) MCG/ACT IN AERS: 2.0000 | INHALATION_SPRAY | Freq: Four times a day (QID) | RESPIRATORY_TRACT | 0 refills | Status: DC | PRN

## 2018-03-01 MED ORDER — CEFUROXIME AXETIL 250 MG PO TABS: 250.0000 mg | ORAL_TABLET | Freq: Two times a day (BID) | ORAL | 0 refills | Status: AC

## 2018-03-01 NOTE — Progress Notes (Signed)
Subjective:  Hannah Bryant is a 72 y.o. female who presents for evaluation of influenza like symptoms.  Symptoms include cough described as productive of thick "beige"  sputum and fever: fevers up to 100.5 degrees.  Onset of symptoms was 5 days ago, and has been gradually worsening since that time.  Treatment to date:  acetaminophen.  High risk factors for influenza complications:  age greater than 34 years of age and co-morbid illness.  The following portions of the patient's history were reviewed and updated as appropriate:  allergies, current medications and past medical history.  Constitutional: positive for anorexia, fatigue, fevers and malaise, negative for night sweats, sweats and weight loss Eyes: negative Ears, nose, mouth, throat, and face: positive for nasal congestion and scratchy throat, negative for ear drainage, earaches, hoarseness and sore throat Respiratory: positive for cough and sputum, negative for pleurisy/chest pain, stridor and wheezing Cardiovascular: negative Gastrointestinal: positive for decreased appetite, negative for abdominal pain, constipation, diarrhea, nausea and vomiting Neurological: positive for headaches, negative for coordination problems, gait problems, paresthesia, speech problems, tremors, vertigo and weakness Allergic/Immunologic: positive for hay fever Objective:  BP 128/82 (BP Location: Right Arm, Patient Position: Sitting, Cuff Size: Normal)   Pulse 82   Temp 99.1 F (37.3 C) (Oral)   Resp 18   Wt 144 lb 9.6 oz (65.6 kg)   LMP 11/18/2000   SpO2 96%   BMI 25.61 kg/m  General appearance: alert, cooperative, fatigued and no distress Head: Normocephalic, without obvious abnormality, atraumatic Eyes: conjunctivae/corneas clear. PERRL, EOM's intact. Fundi benign. Ears: normal TM's and external ear canals both ears Nose: no discharge, turbinates swollen, inflamed, no sinus tenderness Throat: lips, mucosa, and tongue normal; teeth and gums  normal Lungs: clear to auscultation bilaterally Heart: regular rate and rhythm, S1, S2 normal, no murmur, click, rub or gallop Abdomen: soft, non-tender; bowel sounds normal; no masses,  no organomegaly Pulses: 2+ and symmetric Skin: Skin color, texture, turgor normal. No rashes or lesions Lymph nodes: cervical and submandibular nodes normal Neurologic: Grossly normal    Assessment:  Acute Bronchitis    Plan:  1.  Patient prescribed Ceftin 250 mg twice daily for 10 days.  Albuterol inhaler 2 puffs every 6 hours as needed for cough wheezing or shortness of breath.  Tessalon Perles 1 up to 3 times daily as needed for cough.  Continue Tylenol as directed for fever pain or general discomfort.   2.  Patient instructed to increase fluids.  Patient to sleep elevated on 2 pillows.  May also use humidifier at home for cough. 3.  Reviewed instructions with patient to follow-up with PCP if symptoms worsen to include worsening fever worsening cough shortness of breath difficulty breathing or feeling like her heart is racing. 4.  Discussed with patient rationale for starting antibiotics.  Due to age and other significant comorbidities, provider felt it best for the patient to start prophylactic treatment. Patient verbalizes understanding and had no questions at time of discharge.

## 2018-03-01 NOTE — Patient Instructions (Signed)
Upper Respiratory Infection, Adult Most upper respiratory infections (URIs) are a viral infection of the air passages leading to the lungs. A URI affects the nose, throat, and upper air passages. The most common type of URI is nasopharyngitis and is typically referred to as "the common cold." URIs run their course and usually go away on their own. Most of the time, a URI does not require medical attention, but sometimes a bacterial infection in the upper airways can follow a viral infection. This is called a secondary infection. Sinus and middle ear infections are common types of secondary upper respiratory infections. Bacterial pneumonia can also complicate a URI. A URI can worsen asthma and chronic obstructive pulmonary disease (COPD). Sometimes, these complications can require emergency medical care and may be life threatening. What are the causes? Almost all URIs are caused by viruses. A virus is a type of germ and can spread from one person to another. What increases the risk? You may be at risk for a URI if:  You smoke.  You have chronic heart or lung disease.  You have a weakened defense (immune) system.  You are very young or very old.  You have nasal allergies or asthma.  You work in crowded or poorly ventilated areas.  You work in health care facilities or schools.  What are the signs or symptoms? Symptoms typically develop 2-3 days after you come in contact with a cold virus. Most viral URIs last 7-10 days. However, viral URIs from the influenza virus (flu virus) can last 14-18 days and are typically more severe. Symptoms may include:  Runny or stuffy (congested) nose.  Sneezing.  Cough.  Sore throat.  Headache.  Fatigue.  Fever.  Loss of appetite.  Pain in your forehead, behind your eyes, and over your cheekbones (sinus pain).  Muscle aches.  How is this diagnosed? Your health care provider may diagnose a URI by:  Physical exam.  Tests to check that your  symptoms are not due to another condition such as: ? Strep throat. ? Sinusitis. ? Pneumonia. ? Asthma.  How is this treated? A URI goes away on its own with time. It cannot be cured with medicines, but medicines may be prescribed or recommended to relieve symptoms. Medicines may help:  Reduce your fever.  Reduce your cough.  Relieve nasal congestion.  Follow these instructions at home:  Take medicines only as directed by your health care provider.  Gargle warm saltwater or take cough drops to comfort your throat as directed by your health care provider.  Use a warm mist humidifier or inhale steam from a shower to increase air moisture. This may make it easier to breathe.  Drink enough fluid to keep your urine clear or pale yellow.  Eat soups and other clear broths and maintain good nutrition.  Rest as needed.  Return to work when your temperature has returned to normal or as your health care provider advises. You may need to stay home longer to avoid infecting others. You can also use a face mask and careful hand washing to prevent spread of the virus.  Increase the usage of your inhaler if you have asthma.  Do not use any tobacco products, including cigarettes, chewing tobacco, or electronic cigarettes. If you need help quitting, ask your health care provider. How is this prevented? The best way to protect yourself from getting a cold is to practice good hygiene.  Avoid oral or hand contact with people with cold symptoms.  Wash your   hands often if contact occurs.  There is no clear evidence that vitamin C, vitamin E, echinacea, or exercise reduces the chance of developing a cold. However, it is always recommended to get plenty of rest, exercise, and practice good nutrition. Contact a health care provider if:  You are getting worse rather than better.  Your symptoms are not controlled by medicine.  You have chills.  You have worsening shortness of breath.  You have  brown or red mucus.  You have yellow or brown nasal discharge.  You have pain in your face, especially when you bend forward.  You have a fever.  You have swollen neck glands.  You have pain while swallowing.  You have white areas in the back of your throat. Get help right away if:  You have severe or persistent: ? Headache. ? Ear pain. ? Sinus pain. ? Chest pain.  You have chronic lung disease and any of the following: ? Wheezing. ? Prolonged cough. ? Coughing up blood. ? A change in your usual mucus.  You have a stiff neck.  You have changes in your: ? Vision. ? Hearing. ? Thinking. ? Mood. This information is not intended to replace advice given to you by your health care provider. Make sure you discuss any questions you have with your health care provider. Document Released: 04/29/2001 Document Revised: 07/06/2016 Document Reviewed: 02/08/2014 Elsevier Interactive Patient Education  2018 [employer]  

## 2018-03-01 NOTE — Progress Notes (Signed)
poc

## 2018-03-03 ENCOUNTER — Encounter: Payer: Self-pay | Admitting: Internal Medicine

## 2018-03-11 ENCOUNTER — Telehealth: Payer: Self-pay

## 2018-03-19 ENCOUNTER — Other Ambulatory Visit: Payer: Self-pay | Admitting: Internal Medicine

## 2018-03-24 ENCOUNTER — Ambulatory Visit (INDEPENDENT_AMBULATORY_CARE_PROVIDER_SITE_OTHER): Payer: Medicare Other | Admitting: Internal Medicine

## 2018-03-24 ENCOUNTER — Encounter: Payer: Self-pay | Admitting: Internal Medicine

## 2018-03-24 VITALS — BP 129/74 | HR 55 | Ht 63.0 in | Wt 149.0 lb

## 2018-03-24 DIAGNOSIS — E11649 Type 2 diabetes mellitus with hypoglycemia without coma: Secondary | ICD-10-CM

## 2018-03-24 DIAGNOSIS — E161 Other hypoglycemia: Secondary | ICD-10-CM | POA: Diagnosis not present

## 2018-03-24 DIAGNOSIS — M810 Age-related osteoporosis without current pathological fracture: Secondary | ICD-10-CM | POA: Diagnosis not present

## 2018-03-24 LAB — VITAMIN D 25 HYDROXY (VIT D DEFICIENCY, FRACTURES): VITD: 24.24 ng/mL — AB (ref 30.00–100.00)

## 2018-03-24 LAB — TSH: TSH: 1.66 u[IU]/mL (ref 0.35–4.50)

## 2018-03-24 MED ORDER — FREESTYLE LIBRE 14 DAY SENSOR MISC: 1.0000 | 11 refills | Status: DC

## 2018-03-24 NOTE — Patient Instructions (Addendum)
Please stop at the lab.  Please come back for a follow-up appointment in 6 months.  

## 2018-03-24 NOTE — Progress Notes (Signed)
Patient ID: Hannah Bryant, female   DOB: 1946-10-04, 72 y.o.   MRN: [id number]  HPI: Hannah Bryant is a 72 y.o.-year-old female, returning for f/u for DM2, dx in ~2009 (or earlier), diet-controlled, with complications (reactive hypoglycemia) and osteoporosis.  Previously seen by Dr. Chalmers Cater. Last 06/2016.  Last visit with me 6 months ago.  She developed bronchitis 2/2 allergy to pollen. She was on ABx.  Reactive hypoglycemia:  Reviewed and addended history: Pt described that has had hypoglycemia every pm for "ages". Sometimes: sweating, feeling hot, nausea, sometimes vomiting.  She may need to take a nap afterward.  She started to see Dr Roney Mans ([place]) >> hypoglycemic episodes = better. She had an EGD, Ba swallow test >> she had a stricture at the site of her previous hiatal hernia with food retention in the Esophagus long after she eats (procedure: Spring 2016). She had Esophageal dilation. She also had a capsule endoscopy.  Her hypoglycemia improved after esophageal stretching.  Lowest CBG were previously in the 50s but lately in the 60s.  She corrects these with OJ or honey sticks. Lunch is a sandwich/wrap + yoghurt at 1 pm and snack at 3 pm (cheese or PB+apples or PB crackers), supper at 7:30 pm.  She is still trying to get 10,000 steps in (has a fitbit), but not quite daily.  DM2:  Last hemoglobin A1c was: Lab Results  Component Value Date   HGBA1C 6.4 10/12/2017   HGBA1C 6.2 04/01/2017   HGBA1C 6.9 (H) 01/05/2017   She was previously on medication for her diabetes but she stopped as she was not feeling well on it.  She would not want to restart.  Pt checks her sugars with her CGM-freestyle libre: CGM parameters: - Average from CGM: 144+/-26 >> 130 +/- 28.1  Time in range:  - very low (<54): 0% >> 0% - low (54-70): 0% >> 1% - normal range (70-180): 93 >> 93% - high sugars (180-250): 7% >> 6% - very high sugars (250-400): 0% >> 0%  CBGs (25-75%): - am:  130-135  >> 140-150 >> 100-120 - 2h after b'fast: n/c >> 140 >> n/c >> 120-140 - before lunch: n/c >> 125-155 >> 130-1 50 - after lunch: 120s >> 74, 130-170 >> 130- 170 - before dinner: n/c >> 130-160 >> 120-140 - 2h after dinner: n/c >> 135-160 >> 120-160 - bedtime: n/c  - nighttime: n/c Lowest sugar was: 49  >> 50s >> 61 >> 45 (CGM) x1 last night - usually higher with meter checks. She has hypoglycemia awareness in the 80s.  Highest sugar was 160s >> 205 >> 261, 213.  Glucometer: 4: OneTouch Verio and Freestyle Lite  She saw our nutritionist, Antonieta Iba. Last visit 02/2015  -No CKD, last BUN/creatinine:  Lab Results  Component Value Date   BUN 12 04/01/2017   CREATININE 0.71 04/01/2017   -+ HL; last set of lipids: Lab Results  Component Value Date   CHOL 153 01/05/2017   HDL 53.10 01/05/2017   LDLCALC 68 01/05/2017   TRIG 161.0 (H) 01/05/2017   CHOLHDL 3 01/05/2017  On Lipitor.  We reviewed her latest DEXA scan reports together report >> osteopenis, however, as she has a history of fragility fracture, this would qualify as osteoporosis: 02/08/2017 Lumbar spine (L1-L4) Femoral neck (FN)  T-score 0.6 RFN:-2.0 LFN:-1.6  Change in BMD from previous DXA test (%) Down 2.0% Down 4.3%   She has a history of a humeral fracture in 01/2017 (  however, this was not seen on an x-ray...).  At last visit, I suggested Prolia and we discussed about benefits and possible side effects. At that time, she was preparing to have dental implants (fhad 3 implants recently) and wanted to wait until these were complete before deciding for Prolia.  She has a history of vitamin D deficiency: Lab Results  Component Value Date   VD25OH 28.68 (L) 03/04/2017  She is not on a vitamin D supplement.  She will go to [place] in 1 week.  ROS: Constitutional: no weight gain/no weight loss, + fatigue, + subjective hyperthermia, no subjective hypothermia Eyes: no blurry vision, no xerophthalmia ENT: no sore throat,  no nodules palpated in throat, no dysphagia, no odynophagia, no hoarseness Cardiovascular: no CP/no SOB/no palpitations/no leg swelling Respiratory: + cough/no SOB/no wheezing Gastrointestinal: no N/no V/no D/no C/no acid reflux Musculoskeletal: + muscle aches/no joint aches Skin: no rashes, no hair loss Neurological: no tremors/no numbness/no tingling/no dizziness  I reviewed pt's medications, allergies, PMH, social hx, family hx, and changes were documented in the history of present illness. Otherwise, unchanged from my initial visit note.  History   Social History  . Marital Status: Married    Spouse Name: N/A  . Number of Children: 2   Social History Main Topics  . Smoking status: Former Smoker    Types: Cigarettes    Quit date: 11/17/1980  . Smokeless tobacco: Never Used     Comment: smoked 1962-1982, up to 1 ppd  . Alcohol Use: 1.2 oz/week    2 Glasses of wine per week     Comment: socially,2 / week  . Drug Use: No  . Sexual Activity: Yes    Birth Control/ Protection: Post-menopausal   Social History Narrative   Daily caffeine use   Married   Veterinary surgeon intermittently   Past Medical History:  Diagnosis Date  . Abnormal finding on EKG    non specific T changes; short PR  . Arthritis   . Bursitis    tronchanteric  . Complication of anesthesia    Severe PONV  . Diabetes mellitus without complication ([place])    Pt states she is pre-diabetic (12-06-13)  . Diverticulosis   . Gastroparesis    93 % retenton at 2 hrs  . GERD (gastroesophageal reflux disease)   . H/O dilation and curettage   . Hernia    hiatal  . Hiatal hernia   . Hyperlipidemia   . Hyperlipidemia   . hypoglycemia   . 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 CARDIAC CATHETERIZATION  2005   < 30 % lesion  . COLONOSCOPY W/ POLYPECTOMY  2006 ,2007 & 2015   negative 2009; Dr Fuller Plan  . DILATATION &  CURETTAGE/HYSTEROSCOPY WITH TRUECLEAR N/A 01/11/2014   Procedure: DILATATION & CURETTAGE/HYSTEROSCOPY WITH TRUCLEAR;  Surgeon: Anastasio Auerbach, MD;  Location: [facility];  Service: Gynecology;  Laterality: N/A;  . ESOPHAGEAL MANOMETRY  03/22/15   Decreased peristalsis  . Esophagram  03/29/15   Persistent narrowing at site of hiatal hernia; proximal dilation  . G 3 P 3    . HIATAL HERNIA REPAIR  2000  . LEG SURGERY     Trauma LLE/pins and plates  . myomectomy  1999   uterine fibroid  . TUBAL LIGATION    . Upper endoscopy and esophageal dilation  02/20/15   Dr Roney Mans , [facility]   Current Outpatient Medications  on File Prior to Visit  Medication Sig Dispense Refill  . acetaminophen (TYLENOL ARTHRITIS PAIN) 650 MG CR tablet Take 650 mg by mouth. 2 by mouth 2 times daily     . albuterol (PROVENTIL HFA;VENTOLIN HFA) 108 (90 Base) MCG/ACT inhaler Inhale 2 puffs into the lungs every 6 (six) hours as needed for up to 10 days for shortness of breath (cough). 1 Inhaler 0  . aspirin 81 MG chewable tablet Chew 81 mg by mouth daily.    Marland Kitchen atorvastatin (LIPITOR) 40 MG tablet TAKE 1 TABLET BY MOUTH DAILY 90 tablet 3  . Calcium-Vitamin D-Vitamin K (VIACTIV PO) Take by mouth. Reported on 02/28/2016    . Continuous Blood Gluc Sensor (FREESTYLE LIBRE 14 DAY SENSOR) MISC 1 each by Does not apply route every 14 (fourteen) days. Change every 2 weeks 2 each 11  . esomeprazole (NEXIUM) 40 MG capsule Take 1 capsule (40 mg total) by mouth daily. --- Needs office visit before further refills   30 capsule 2  . fexofenadine (ALLEGRA) 180 MG tablet Take 180 mg by mouth daily.      . hydrochlorothiazide (MICROZIDE) 12.5 MG capsule Take 1 capsule (12.5 mg total) by mouth daily. 90 capsule 3  . hyoscyamine (LEVSIN SL) 0.125 MG SL tablet DISSOLVE ONE OR TWO TABLETS UNDER TONGUE EVERY 4-6 HOURS AS NEEDED FOR ABDOMINAL PAIN AND CRAMPING 90 tablet 0  . hyoscyamine (LEVSIN, ANASPAZ) 0.125 MG tablet Take 0.125 mg by mouth every 4 (four)  hours as needed.    . Lancets MISC by Does not apply route. Lifespan ultra 2 test strips and lancets     . meclizine (ANTIVERT) 12.5 MG tablet Take 1-2 tablets (12.5-25 mg total) by mouth 3 (three) times daily as needed for dizziness. (Patient not taking: Reported on 03/01/2018) 60 tablet 0  . montelukast (SINGULAIR) 10 MG tablet TAKE 1 TABLET BY MOUTH EVERY DAY AS NEEDED 90 tablet 3  . OVER THE COUNTER MEDICATION multistrain probiotic    . promethazine (PHENERGAN) 25 MG tablet Take 1 tablet (25 mg total) by mouth every 8 (eight) hours as needed for nausea or vomiting. (Patient not taking: Reported on 03/01/2018) 30 tablet 0   No current facility-administered medications on file prior to visit.    Allergies  Allergen Reactions  . Domperidone     Stomach cramps  . Fentanyl Nausea And Vomiting  . Monistat [Miconazole Nitrate]     SEVERE BURNING  . Nsaids     REACTION: stomach complaints  . Oxycodone Hcl     REACTION: VOMITTING  . Codeine     nausea  . Erythromycin Ethylsuccinate     gastritis   Family History  Problem Relation Age of Onset  . Ulcers Father   . Diabetes Father   . Hypertension Father   . Heart attack Father        > 84  . Stroke Father        in 42s  . Heart attack Mother 91  . Diabetes Sister   . Heart attack Sister 76       CABG X 4  . Cancer Neg Hx   . Colitis Neg Hx    PE: BP 129/74   Pulse (!) 55   Ht 5\' 3"  (1.6 m)   Wt 149 lb (67.6 kg)   LMP 11/18/2000   BMI 26.39 kg/m  Body mass index is 26.39 kg/m. Wt Readings from Last 3 Encounters:  03/24/18 149 lb (67.6 kg)  03/01/18  144 lb 9.6 oz (65.6 kg)  11/25/17 145 lb (65.8 kg)   Constitutional: normal weight, in NAD Eyes: PERRLA, EOMI, no exophthalmos ENT: moist mucous membranes, no thyromegaly, no cervical lymphadenopathy Cardiovascular: RRR, No MRG Respiratory: CTA B Gastrointestinal: abdomen soft, NT, ND, BS+ Musculoskeletal: no deformities, strength intact in all 4 Skin: moist, warm, no  rashes Neurological: no tremor with outstretched hands, DTR normal in all 4  ASSESSMENT: 1. Diet-controlled DM2, without complications.  2. Hypoglycemia - possibly reactive - improved - We ruled out adrenal insufficiency: Component     Latest Ref Rng 02/02/2015 02/02/2015 02/02/2015         9:21 AM 10:02 AM 10:02 AM  Cortisol, Plasma      7.1 29.1 24.2  C206 ACTH     6 - 50 pg/mL 12     3.  Osteopenia - + h/o Humeral fracture  PLAN:  1. DM2, fairly well-controlled - Diet controlled -She continues to have an occasional hyperglycemic spike after a meal, however, these are not frequently higher than 180.  She has had low blood sugars in the past, but in the last 6 months, lowest has been in the 60s, usually after a hyperglycemic event.  She does have a history of gastroparesis and this may contribute.   - We analyzed the CGM traces and discussed about her fluctuating blood sugars.  She has 1 blood sugar in the 40s, but I explained that when she sees these type of values, she needs to check with her meter, since this usually shows values approximately 20 mg/dL higher. - today, HbA1c is 6.1% (better) - continue checking sugars at different times of the day with her CGM - Return to clinic in 6 mo with sugar log   2. Patient with history of reactive (postprandial) hypoglycemia -Please see above -Her postprandial hypoglycemia episodes improved after esophageal stretching -In the past, she had sugars in the 50s, but recently >60, except for one value which was in the 40s by CGM -Usually low blood sugars occur after dinner as this is a larger meal that she may eat out >> she is drinking milk after dinner so I advised her to avoid liquids with meals  3. Osteopenia + fracture >> osteoporosis -Likely age-related + postmenopausal -She has osteopenia on her latest bone density scan and a history of humeral fracture.  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  At this visit, she is telling me that she is still reticent to start Prolia until she heals completely from her implants -We decided to get another bone density scan next spring and decide at that time whether to start Prolia or not, depending on the T-scores -Until then, we will check a vitamin D level and start supplementation as needed.  Per her request, we will also add a TSH.  Component     Latest Ref Rng & Units 03/24/2018  TSH     0.35 - 4.50 uIU/mL 1.66  VITD     30.00 - 100.00 ng/mL 24.24 (L)   TSH normal. Vit D low >> start 2000 units vitamin D3 daily.  Philemon Kingdom, MD PhD [facility] Endocrinology

## 2018-04-14 ENCOUNTER — Ambulatory Visit: Payer: Medicare Other | Admitting: Internal Medicine

## 2018-04-26 DIAGNOSIS — Z1231 Encounter for screening mammogram for malignant neoplasm of breast: Secondary | ICD-10-CM | POA: Diagnosis not present

## 2018-04-26 LAB — HM MAMMOGRAPHY

## 2018-04-29 ENCOUNTER — Encounter: Payer: Self-pay | Admitting: Internal Medicine

## 2018-05-23 NOTE — Progress Notes (Signed)
Subjective:    Patient ID: Hannah Bryant, female    DOB: 10-Dec-1945, 72 y.o.   MRN: [id number]  HPI The patient is here for follow up.  Diabetes: she follows with Dr Cruzita Lederer.  She is taking her medication daily as prescribed. She is compliant with a diabetic diet. She is exercising regularly - walking. She checks her feet daily and denies foot lesions. She is up-to-date with an ophthalmology examination.   Hypertension: She is taking her medication daily. She is compliant with a low sodium diet.  She denies chest pain, palpitations, edema, shortness of breath and regular headaches. She is exercising regularly.  She does not monitor her blood pressure at home.    Hyperlipidemia: She is taking her medication daily. She is compliant with a low fat/cholesterol diet. She is exercising regularly. She denies myalgias.   GERD:  She is taking her medication daily as prescribed.  She denies any GERD symptoms and feels her GERD is well controlled.    LLQ cramping:  She has cramping in her LLQ daily.   This is a chronic pain.  She denies constipation or diarrhea.  The levsin helps - she takes it every 4 hrs. she has seen GI regarding this pain.  Medications and allergies reviewed with patient and updated if appropriate.  Patient Active Problem List   Diagnosis Date Noted  . Right rotator cuff tear 05/11/2017  . Cyst of joint of shoulder 03/04/2017  . Acute pain of right shoulder 02/06/2017  . Left sided abdominal pain 03/25/2016  . Type 2 diabetes mellitus with hypoglycemia without coma, without long-term current use of insulin ([place]) 02/28/2016  . Reactive hypoglycemia 02/28/2016  . Retrocalcaneal bursitis 02/26/2016  . Osteoporosis 01/09/2016  . Essential hypertension, benign 01/09/2016  . Achilles tendinosis 06/22/2015  . Junctional nevus of left thigh 12/23/2014  . Recurrent abdominal pain 11/28/2014  . Left rotator cuff tear arthropathy 08/21/2014  . Squamous cell skin cancer  10/06/2013  . BENIGN POSITIONAL VERTIGO 08/27/2009  . NONSPECIFIC ABNORM RESULTS THYROID FUNCT STUDY 07/13/2009  . History of colonic polyps 05/09/2008  . Gastroparesis 11/15/2007  . DIVERTICULOSIS, COLON 11/15/2007  . GASTROESOPHAGEAL REFLUX DISEASE, SEVERE 05/12/2007  . Dysphagia 05/12/2007  . Nonspecific abnormal electrocardiogram (ECG) (EKG) 05/12/2007  . Hyperlipidemia 05/07/2007    Current Outpatient Medications on File Prior to Visit  Medication Sig Dispense Refill  . acetaminophen (TYLENOL ARTHRITIS PAIN) 650 MG CR tablet Take 650 mg by mouth. 2 by mouth 2 times daily     . aspirin 81 MG chewable tablet Chew 81 mg by mouth daily.    Marland Kitchen atorvastatin (LIPITOR) 40 MG tablet TAKE 1 TABLET BY MOUTH DAILY 90 tablet 3  . Calcium-Vitamin D-Vitamin K (VIACTIV PO) Take by mouth. Reported on 02/28/2016    . Continuous Blood Gluc Sensor (FREESTYLE LIBRE 14 DAY SENSOR) MISC 1 each by Does not apply route every 14 (fourteen) days. Change every 2 weeks 2 each 11  . esomeprazole (NEXIUM) 40 MG capsule Take 1 capsule (40 mg total) by mouth daily. --- Needs office visit before further refills   30 capsule 2  . fexofenadine (ALLEGRA) 180 MG tablet Take 180 mg by mouth daily.      . hydrochlorothiazide (MICROZIDE) 12.5 MG capsule Take 1 capsule (12.5 mg total) by mouth daily. 90 capsule 3  . hyoscyamine (LEVSIN, ANASPAZ) 0.125 MG tablet Take 0.125 mg by mouth every 4 (four) hours as needed.    . Lancets MISC by Does  not apply route. Lifespan ultra 2 test strips and lancets     . meclizine (ANTIVERT) 12.5 MG tablet Take 1-2 tablets (12.5-25 mg total) by mouth 3 (three) times daily as needed for dizziness. 60 tablet 0  . montelukast (SINGULAIR) 10 MG tablet TAKE 1 TABLET BY MOUTH EVERY DAY AS NEEDED 90 tablet 3  . OVER THE COUNTER MEDICATION multistrain probiotic    . promethazine (PHENERGAN) 25 MG tablet Take 1 tablet (25 mg total) by mouth every 8 (eight) hours as needed for nausea or vomiting. 30  tablet 0   No current facility-administered medications on file prior to visit.     Past Medical History:  Diagnosis Date  . Abnormal finding on EKG    non specific T changes; short PR  . Arthritis   . Bursitis    tronchanteric  . Complication of anesthesia    Severe PONV  . Diabetes mellitus without complication ([place])    Pt states she is pre-diabetic (12-06-13)  . Diverticulosis   . Gastroparesis    93 % retenton at 2 hrs  . GERD (gastroesophageal reflux disease)   . H/O dilation and curettage   . Hernia    hiatal  . Hiatal hernia   . Hyperlipidemia   . Hyperlipidemia   . hypoglycemia   . 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 CARDIAC CATHETERIZATION  2005   < 30 % lesion  . COLONOSCOPY W/ POLYPECTOMY  2006 ,2007 & 2015   negative 2009; Dr Fuller Plan  . DILATATION & CURETTAGE/HYSTEROSCOPY WITH TRUECLEAR N/A 01/11/2014   Procedure: DILATATION & CURETTAGE/HYSTEROSCOPY WITH TRUCLEAR;  Surgeon: Anastasio Auerbach, MD;  Location: [facility];  Service: Gynecology;  Laterality: N/A;  . ESOPHAGEAL MANOMETRY  03/22/15   Decreased peristalsis  . Esophagram  03/29/15   Persistent narrowing at site of hiatal hernia; proximal dilation  . G 3 P 3    . HIATAL HERNIA REPAIR  2000  . LEG SURGERY     Trauma LLE/pins and plates  . myomectomy  1999   uterine fibroid  . TUBAL LIGATION    . Upper endoscopy and esophageal dilation  02/20/15   Dr Roney Mans , [facility]    Social History   Socioeconomic History  . Marital status: Married    Spouse name: Not on file  . Number of children: 2  . Years of education: Not on file  . Highest education level: Not on file  Occupational History    Employer: UNEMPLOYED  Social Needs  . Financial resource strain: Not on file  . Food insecurity:    Worry: Not on file    Inability: Not on file  . Transportation needs:    Medical: Not on file    Non-medical: Not on file  Tobacco  Use  . Smoking status: Former Smoker    Types: Cigarettes    Last attempt to quit: 11/17/1980    Years since quitting: 37.5  . Smokeless tobacco: Never Used  . Tobacco comment: smoked 1962-1982, up to 1 ppd  Substance and Sexual Activity  . Alcohol use: Yes    Alcohol/week: 1.2 oz    Types: 2 Glasses of wine per week    Comment: socially,2 / week  . Drug use: No  . Sexual activity: Yes    Birth control/protection: Post-menopausal  Lifestyle  . Physical activity:    Days per week: Not on file  Minutes per session: Not on file  . Stress: Not on file  Relationships  . Social connections:    Talks on phone: Not on file    Gets together: Not on file    Attends religious service: Not on file    Active member of club or organization: Not on file    Attends meetings of clubs or organizations: Not on file    Relationship status: Not on file  Other Topics Concern  . Not on file  Social History Narrative   Daily caffeine use   Married   Veterinary surgeon intermittently    Family History  Problem Relation Age of Onset  . Ulcers Father   . Diabetes Father   . Hypertension Father   . Heart attack Father        > 80  . Stroke Father        in 35s  . Heart attack Mother 57  . Diabetes Sister   . Heart attack Sister 1       CABG X 4  . Cancer Neg Hx   . Colitis Neg Hx     Review of Systems  Constitutional: Negative for chills and fever.  Respiratory: Negative for cough, shortness of breath and wheezing.   Cardiovascular: Negative for chest pain, palpitations and leg swelling.  Gastrointestinal: Positive for abdominal pain (cramping in llq- chronic).  Neurological: Negative for light-headedness and headaches.       Objective:   Vitals:   05/25/18 0920  BP: 136/86  Pulse: 60  Resp: 16  Temp: 98.2 F (36.8 C)  SpO2: 98%   BP Readings from Last 3 Encounters:  05/25/18 136/86  03/24/18 129/74  03/01/18 128/82   Wt Readings from Last 3 Encounters:    05/25/18 150 lb (68 kg)  03/24/18 149 lb (67.6 kg)  03/01/18 144 lb 9.6 oz (65.6 kg)   Body mass index is 26.57 kg/m.   Physical Exam    Constitutional: Appears well-developed and well-nourished. No distress.  HENT:  Head: Normocephalic and atraumatic.  Neck: Neck supple. No tracheal deviation present. No thyromegaly present.  No cervical lymphadenopathy Cardiovascular: Normal rate, regular rhythm and normal heart sounds.   No murmur heard. No carotid bruit .  No edema Pulmonary/Chest: Effort normal and breath sounds normal. No respiratory distress. No has no wheezes. No rales.  Skin: Skin is warm and dry. Not diaphoretic.  Psychiatric: Normal mood and affect. Behavior is normal.   Diabetic Foot Exam - Simple   Simple Foot Form Diabetic Foot exam was performed with the following findings:  Yes 05/25/2018  9:58 AM  Visual Inspection No deformities, no ulcerations, no other skin breakdown bilaterally:  Yes Sensation Testing Intact to touch and monofilament testing bilaterally:  Yes Pulse Check Posterior Tibialis and Dorsalis pulse intact bilaterally:  Yes Comments       Assessment & Plan:    See Problem List for Assessment and Plan of chronic medical problems.

## 2018-05-23 NOTE — Patient Instructions (Addendum)
  Test(s) ordered today. Your results will be released to MyChart (or called to you) after review, usually within 72hours after test completion. If any changes need to be made, you will be notified at that same time.  Medications reviewed and updated.  No changes recommended at this time.    Please followup in 6 months   

## 2018-05-25 ENCOUNTER — Ambulatory Visit (INDEPENDENT_AMBULATORY_CARE_PROVIDER_SITE_OTHER): Payer: Medicare Other | Admitting: Internal Medicine

## 2018-05-25 ENCOUNTER — Encounter: Payer: Self-pay | Admitting: Internal Medicine

## 2018-05-25 VITALS — BP 136/86 | HR 60 | Temp 98.2°F | Resp 16 | Wt 150.0 lb

## 2018-05-25 DIAGNOSIS — E7849 Other hyperlipidemia: Secondary | ICD-10-CM

## 2018-05-25 DIAGNOSIS — E11649 Type 2 diabetes mellitus with hypoglycemia without coma: Secondary | ICD-10-CM

## 2018-05-25 DIAGNOSIS — K2100 Gastro-esophageal reflux disease with esophagitis, without bleeding: Secondary | ICD-10-CM

## 2018-05-25 DIAGNOSIS — I10 Essential (primary) hypertension: Secondary | ICD-10-CM | POA: Diagnosis not present

## 2018-05-25 DIAGNOSIS — R109 Unspecified abdominal pain: Secondary | ICD-10-CM | POA: Diagnosis not present

## 2018-05-25 NOTE — Assessment & Plan Note (Signed)
Chronic, daily No obvious cause Controlled with hyoscyamine 4 times a day

## 2018-05-25 NOTE — Assessment & Plan Note (Signed)
a1c 6.1% two months ago management per Dr Cruzita Lederer Walking for exercise

## 2018-05-25 NOTE — Assessment & Plan Note (Signed)
BP well controlled Current regimen effective and well tolerated Continue current medications at current doses CMP 

## 2018-05-25 NOTE — Assessment & Plan Note (Signed)
Check lipid panel  Continue daily statin Regular exercise and healthy diet encouraged  

## 2018-05-25 NOTE — Assessment & Plan Note (Signed)
GERD controlled Continue daily medication  

## 2018-06-02 ENCOUNTER — Encounter: Payer: Self-pay | Admitting: Internal Medicine

## 2018-06-04 DIAGNOSIS — M67912 Unspecified disorder of synovium and tendon, left shoulder: Secondary | ICD-10-CM | POA: Diagnosis not present

## 2018-06-07 ENCOUNTER — Ambulatory Visit: Payer: Self-pay | Admitting: Nurse Practitioner

## 2018-06-07 ENCOUNTER — Encounter: Payer: Self-pay | Admitting: Nurse Practitioner

## 2018-06-07 VITALS — BP 140/78 | HR 66 | Temp 98.7°F | Wt 148.6 lb

## 2018-06-07 DIAGNOSIS — S0031XA Abrasion of nose, initial encounter: Secondary | ICD-10-CM

## 2018-06-07 NOTE — Patient Instructions (Signed)
Abrasion An abrasion is a cut or scrape on the outer surface of your skin. An abrasion does not extend through all of the layers of your skin. It is important to care for your abrasion properly to prevent infection. What are the causes? Most abrasions are caused by falling on or gliding across the ground or another surface. When your skin rubs on something, the outer and inner layer of skin rubs off. What are the signs or symptoms? A cut or scrape is the main symptom of this condition. The scrape may be bleeding, or it may appear red or pink. If there was an associated fall, there may be an underlying bruise. How is this diagnosed? An abrasion is diagnosed with a physical exam. How is this treated? Treatment for this condition depends on how large and deep the abrasion is. Usually, your abrasion will be cleaned with water and mild soap. This removes any dirt or debris that may be stuck. An antibiotic ointment may be applied to the abrasion to help prevent infection. A bandage (dressing) may be placed on the abrasion to keep it clean. You may also need a tetanus shot. Follow these instructions at home: Medicines   Take or apply medicines only as directed by your health care provider.  If you were prescribed an antibiotic ointment, finish all of it even if you start to feel better. Wound care   Clean the wound with mild soap and water 2-3 times per day or as directed by your health care provider. Pat your wound dry with a clean towel. Do not rub it.  There are many different ways to close and cover a wound. Follow instructions from your health care provider about:  Wound care.  Dressing changes and removal.  Check your wound every day for signs of infection. Watch for:  Redness, swelling, or pain.  Fluid, blood, or pus. General instructions    Keep the dressing dry as directed by your health care provider. Do not take baths, swim, use a hot tub, or do anything that would put your  wound underwater until your health care provider approves.  If there is swelling, raise (elevate) the injured area above the level of your heart while you are sitting or lying down.  Keep all follow-up visits as directed by your health care provider. This is important. Contact a health care provider if:  You received a tetanus shot and you have swelling, severe pain, redness, or bleeding at the injection site.  Your pain is not controlled with medicine.  You have increased redness, swelling, or pain at the site of your wound. Get help right away if:  You have a red streak going away from your wound.  You have a fever.  You have fluid, blood, or pus coming from your wound.  You notice a bad smell coming from your wound or your dressing. This information is not intended to replace advice given to you by your health care provider. Make sure you discuss any questions you have with your health care provider. Document Released: 08/13/2005 Document Revised: 07/04/2016 Document Reviewed: 11/01/2014 Elsevier Interactive Patient Education  2017 [employer]  

## 2018-06-07 NOTE — Progress Notes (Signed)
Subjective:    Hannah Bryant is a 72 y.o. female who [place] with bleeding from the left nostril.  The patient states over the last week she had been treating her nose for a minor abrasion with Neosporin.  Patient was applying the Neosporin twice daily since that time.  Patient states today she did not use the Neosporin and was out in her yard.  When she got out of the shower she brushed her nose and the bleeding from her nose started at that time.  Patient states that it was easy to control.  The patient also states that she was concerned that it was an infection or some type of bacteria because she had recently been to the beach.   It is associated with trauma.    Objective:    External trauma is not present.  Upon presentation, active bleeding is not present from left nostril. Abrasion to inner turbinate. Turbinate is inflammed, no edema. No nasal congestion or drainage.    Assessment:   Left Nare Abrasion   Plan:   Exam findings, diagnosis etiology and medication use and indications reviewed with patient. Follow- Up and discharge instructions provided. No emergent/urgent issues found on exam.  Patient verbalized understanding of information provided and agrees with plan of care (POC), all questions answered.  1. Abrasion of nose, left nare  -Continue use of neosporin twice daily until symptoms improve. -Do not disrupt scab when formed in nose. -Follow up if uncontrollable nose bleed reoccurs or other concerns.

## 2018-06-10 ENCOUNTER — Telehealth: Payer: Self-pay

## 2018-06-10 NOTE — Telephone Encounter (Signed)
I was no able to contact the patient in the number provided  

## 2018-06-19 ENCOUNTER — Encounter: Payer: Self-pay | Admitting: Family Medicine

## 2018-06-19 ENCOUNTER — Ambulatory Visit (INDEPENDENT_AMBULATORY_CARE_PROVIDER_SITE_OTHER): Payer: Self-pay | Admitting: Family Medicine

## 2018-06-19 VITALS — BP 130/72 | HR 59 | Temp 98.7°F | Resp 16 | Ht 63.0 in | Wt 148.2 lb

## 2018-06-19 DIAGNOSIS — J3489 Other specified disorders of nose and nasal sinuses: Secondary | ICD-10-CM

## 2018-06-19 DIAGNOSIS — Z8709 Personal history of other diseases of the respiratory system: Secondary | ICD-10-CM

## 2018-06-19 DIAGNOSIS — J309 Allergic rhinitis, unspecified: Secondary | ICD-10-CM

## 2018-06-19 MED ORDER — MUPIROCIN CALCIUM 2 % NA OINT: 1.0000 "application " | TOPICAL_OINTMENT | Freq: Two times a day (BID) | NASAL | 0 refills | Status: DC

## 2018-06-19 MED ORDER — FLUTICASONE PROPIONATE 50 MCG/ACT NA SUSP: 2.0000 | Freq: Every day | NASAL | 0 refills | Status: DC

## 2018-06-19 NOTE — Patient Instructions (Signed)

## 2018-06-19 NOTE — Progress Notes (Addendum)
Hannah Bryant is a 72 y.o. female who presents today with concerns of nasal irritation. She has a history of seasonal allergies and reports that this issue began in the left nostril resolved and is now affecting the right nostril. She has been previously evaluated for this condition and has treated with OTC neosporin.  Review of Systems  Constitutional: Negative for chills, fever and malaise/fatigue.  HENT: Negative for congestion, ear discharge, ear pain, sinus pain and sore throat.        Concern for nasal abrasion  Eyes: Negative.   Respiratory: Negative for cough, sputum production and shortness of breath.   Cardiovascular: Negative.  Negative for chest pain.  Gastrointestinal: Negative for abdominal pain, diarrhea, nausea and vomiting.  Genitourinary: Negative for dysuria, frequency, hematuria and urgency.  Musculoskeletal: Negative for myalgias.  Skin: Negative.   Neurological: Negative for headaches.  Endo/Heme/Allergies: Negative.   Psychiatric/Behavioral: Negative.     O: Vitals:   06/19/18 1159  BP: 130/72  Pulse: (!) 59  Resp: 16  Temp: 98.7 F (37.1 C)  SpO2: 97%     Physical Exam  Constitutional: She is oriented to person, place, and time. Vital signs are normal. She appears well-developed and well-nourished. She is active.  Non-toxic appearance. She does not have a sickly appearance.  HENT:  Head: Normocephalic.  Right Ear: Hearing, tympanic membrane, external ear and ear canal normal.  Left Ear: Hearing, tympanic membrane, external ear and ear canal normal.  Nose: Rhinorrhea, sinus tenderness and septal deviation present.    Mouth/Throat: Uvula is midline and oropharynx is clear and moist.  Small erythemic area to right nostril upper distal medial area of nasal canal- no ability to decipher if lesion or abrasion- nasal tissue is otherwise intact and WNL on exam- mild rhinorrhea and inflammation that would appear patient has some type of seasonal allergy.    Neck: Normal range of motion. Neck supple.  Cardiovascular: Normal rate, regular rhythm, normal heart sounds and normal pulses.  Pulmonary/Chest: Effort normal and breath sounds normal.  Abdominal: Soft. Bowel sounds are normal.  Musculoskeletal: Normal range of motion.  Lymphadenopathy:       Head (right side): No submental and no submandibular adenopathy present.       Head (left side): No submental and no submandibular adenopathy present.    She has no cervical adenopathy.  Neurological: She is alert and oriented to person, place, and time.  Psychiatric: She has a normal mood and affect.  Vitals reviewed.    A: 1. Allergic rhinitis, unspecified seasonality, unspecified trigger      P: Discussed exam findings, diagnosis etiology and medication use and indications reviewed with patient. Follow- Up and discharge instructions provided. No emergent/urgent issues found on exam.  Patient verbalized understanding of information provided and agrees with plan of care (POC), all questions answered.  1. Allergic rhinitis, unspecified seasonality, unspecified trigger Patient confirms a history of seasonal allergies but does not seem convinced that this issue is a result of allergic rhinitis. A brief explanation of the patho of nasal inflammation associated with seasonal allergies reviewed. Patient encouraged to trial treatment for 5-7 days and seek care with PCP if condition persists.  - mupirocin nasal ointment (BACTROBAN NASAL) 2 %; Place 1 application into the nose 2 (two) times daily for 5 days. After application, press sides of nose together and gently massage. - fluticasone (FLONASE) 50 MCG/ACT nasal spray; Place 2 sprays into both nostrils daily.

## 2018-06-21 ENCOUNTER — Telehealth: Payer: Self-pay

## 2018-06-21 DIAGNOSIS — H2513 Age-related nuclear cataract, bilateral: Secondary | ICD-10-CM | POA: Diagnosis not present

## 2018-06-21 DIAGNOSIS — H524 Presbyopia: Secondary | ICD-10-CM | POA: Diagnosis not present

## 2018-06-21 DIAGNOSIS — H43811 Vitreous degeneration, right eye: Secondary | ICD-10-CM | POA: Diagnosis not present

## 2018-06-21 LAB — HM DIABETES EYE EXAM

## 2018-06-21 NOTE — Telephone Encounter (Signed)
I spoke with patient and she was not happy at all with the follow up call, she doesnt want to be calla again for a follow up call again, then she said bye bye and hang out.

## 2018-06-23 ENCOUNTER — Telehealth: Payer: Self-pay | Admitting: Family Medicine

## 2018-06-23 NOTE — Telephone Encounter (Signed)
Patient called to in response to a communication from [facility] about medication being unavailable- patient reached by phone and communicated that she received the medication from a alternative pharmacy that had the medication in stock in the size prescribed. [facility]. She reports that the condition is mildly improved- but not resolved. She is unhappy that her condition is not improved.

## 2018-06-24 ENCOUNTER — Other Ambulatory Visit: Payer: Self-pay | Admitting: Internal Medicine

## 2018-07-30 DIAGNOSIS — M65341 Trigger finger, right ring finger: Secondary | ICD-10-CM | POA: Diagnosis not present

## 2018-08-24 DIAGNOSIS — M65341 Trigger finger, right ring finger: Secondary | ICD-10-CM | POA: Diagnosis not present

## 2018-08-25 DIAGNOSIS — Z23 Encounter for immunization: Secondary | ICD-10-CM | POA: Diagnosis not present

## 2018-09-01 DIAGNOSIS — Z9889 Other specified postprocedural states: Secondary | ICD-10-CM | POA: Diagnosis not present

## 2018-09-15 ENCOUNTER — Ambulatory Visit
Admission: RE | Admit: 2018-09-15 | Discharge: 2018-09-15 | Disposition: A | Discharge status: Home / Self Care | Payer: Medicare Other | Source: Ambulatory Visit | Attending: Internal Medicine | Admitting: Internal Medicine

## 2018-09-15 ENCOUNTER — Other Ambulatory Visit (INDEPENDENT_AMBULATORY_CARE_PROVIDER_SITE_OTHER): Payer: Medicare Other

## 2018-09-15 ENCOUNTER — Ambulatory Visit (INDEPENDENT_AMBULATORY_CARE_PROVIDER_SITE_OTHER): Payer: Medicare Other | Admitting: Internal Medicine

## 2018-09-15 ENCOUNTER — Ambulatory Visit: Payer: Self-pay | Admitting: *Deleted

## 2018-09-15 ENCOUNTER — Encounter: Payer: Self-pay | Admitting: Internal Medicine

## 2018-09-15 VITALS — BP 156/92 | HR 64 | Temp 98.4°F | Resp 16 | Ht 63.0 in | Wt 153.0 lb

## 2018-09-15 DIAGNOSIS — R1011 Right upper quadrant pain: Secondary | ICD-10-CM | POA: Insufficient documentation

## 2018-09-15 DIAGNOSIS — R1013 Epigastric pain: Secondary | ICD-10-CM | POA: Insufficient documentation

## 2018-09-15 DIAGNOSIS — I10 Essential (primary) hypertension: Secondary | ICD-10-CM | POA: Diagnosis not present

## 2018-09-15 DIAGNOSIS — E11649 Type 2 diabetes mellitus with hypoglycemia without coma: Secondary | ICD-10-CM | POA: Diagnosis not present

## 2018-09-15 DIAGNOSIS — E7849 Other hyperlipidemia: Secondary | ICD-10-CM

## 2018-09-15 LAB — CBC WITH DIFFERENTIAL/PLATELET
Basophils Absolute: 0 10*3/uL (ref 0.0–0.1)
Basophils Relative: 0.4 % (ref 0.0–3.0)
EOS PCT: 1.4 % (ref 0.0–5.0)
Eosinophils Absolute: 0.1 10*3/uL (ref 0.0–0.7)
HCT: 41.2 % (ref 36.0–46.0)
Hemoglobin: 14 g/dL (ref 12.0–15.0)
LYMPHS ABS: 3.3 10*3/uL (ref 0.7–4.0)
Lymphocytes Relative: 41.3 % (ref 12.0–46.0)
MCHC: 33.9 g/dL (ref 30.0–36.0)
MCV: 97 fl (ref 78.0–100.0)
MONO ABS: 0.5 10*3/uL (ref 0.1–1.0)
MONOS PCT: 6.6 % (ref 3.0–12.0)
NEUTROS ABS: 4 10*3/uL (ref 1.4–7.7)
NEUTROS PCT: 50.3 % (ref 43.0–77.0)
Platelets: 156 10*3/uL (ref 150.0–400.0)
RBC: 4.25 Mil/uL (ref 3.87–5.11)
RDW: 12.7 % (ref 11.5–15.5)
WBC: 7.9 10*3/uL (ref 4.0–10.5)

## 2018-09-15 LAB — MICROALBUMIN / CREATININE URINE RATIO
CREATININE, U: 159.7 mg/dL
MICROALB/CREAT RATIO: 0.8 mg/g (ref 0.0–30.0)
Microalb, Ur: 1.2 mg/dL (ref 0.0–1.9)

## 2018-09-15 LAB — COMPREHENSIVE METABOLIC PANEL
ALBUMIN: 4.4 g/dL (ref 3.5–5.2)
ALK PHOS: 61 U/L (ref 39–117)
ALT: 18 U/L (ref 0–35)
AST: 21 U/L (ref 0–37)
BUN: 14 mg/dL (ref 6–23)
CHLORIDE: 101 meq/L (ref 96–112)
CO2: 33 mEq/L — ABNORMAL HIGH (ref 19–32)
CREATININE: 0.72 mg/dL (ref 0.40–1.20)
Calcium: 9.6 mg/dL (ref 8.4–10.5)
GFR: 84.2 mL/min (ref >60.00)
GLUCOSE: 109 mg/dL — AB (ref 70–99)
POTASSIUM: 4 meq/L (ref 3.5–5.1)
SODIUM: 142 meq/L (ref 135–145)
TOTAL PROTEIN: 6.9 g/dL (ref 6.0–8.3)
Total Bilirubin: 0.8 mg/dL (ref 0.2–1.2)

## 2018-09-15 LAB — LIPID PANEL
CHOLESTEROL: 150 mg/dL (ref 0–200)
HDL: 64.3 mg/dL (ref >39.00)
LDL CALC: 61 mg/dL (ref 0–99)
NONHDL: 85.74
Total CHOL/HDL Ratio: 2
Triglycerides: 122 mg/dL (ref 0.0–149.0)
VLDL: 24.4 mg/dL (ref 0.0–40.0)

## 2018-09-15 LAB — HEMOGLOBIN A1C: Hgb A1c MFr Bld: 6.5 % (ref 4.6–6.5)

## 2018-09-15 MED ORDER — IOPAMIDOL (ISOVUE-300) INJECTION 61%
100.0000 mL | Freq: Once | INTRAVENOUS | Status: AC | PRN
  Administered 2018-09-15: 100 mL via INTRAVENOUS

## 2018-09-15 NOTE — Assessment & Plan Note (Signed)
Right upper quadrant/epigastric pain-started out as a fullness last night and then more severe pain this morning and it has improved since then Concern for possible gallbladder disease or esophageal stricture, which she has had in the past No dysphasia or GERD symptoms Will get blood work including CBC, CMP-she was supposed to have this at her last visit Will obtain imaging-only able to get a CT scan today-ordered

## 2018-09-15 NOTE — Telephone Encounter (Signed)
FYI

## 2018-09-15 NOTE — Telephone Encounter (Signed)
Pt called with complaints of severe abdominal pain in her upper abdomen; she said that last night she felt "full"; the pt says that when she got up during the night she felt the same fullness and pressure; this morning the pain has intensified, and has eased some, but the pain is constant; recommendations made per nurse triage protocol; pt offered and accepted appointment with Dr Billey Gosling, [facility], 09/15/18 at 1030; she verbalized understanding; will route to office for notification of this upcoming appointment.  Reason for Disposition . [1] MILD-MODERATE pain AND [2] constant AND [3] present > 2 hours  Answer Assessment - Initial Assessment Questions 1. LOCATION: "Where does it hurt?"      Upper abdomen 2. RADIATION: "Does the pain shoot anywhere else?" (e.g., chest, back)     no 3. ONSET: "When did the pain begin?" (e.g., minutes, hours or days ago)      09/15/18 at 0600 4. SUDDEN: "Gradual or sudden onset?"     "when I got up" 5. PATTERN "Does the pain come and go, or is it constant?"    - If constant: "Is it getting better, staying the same, or worsening?"      (Note: Constant means the pain never goes away completely; most serious pain is constant and it progresses)     - If intermittent: "How long does it last?" "Do you have pain now?"     (Note: Intermittent means the pain goes away completely between bouts)     constant 6. SEVERITY: "How bad is the pain?"  (e.g., Scale 1-10; mild, moderate, or severe)   - MILD (1-3): doesn't interfere with normal activities, abdomen soft and not tender to touch    - MODERATE (4-7): interferes with normal activities or awakens from sleep, tender to touch    - SEVERE (8-10): excruciating pain, doubled over, unable to do any normal activities      Mild rated 1-3 now; originally was 7 at 0600 on 09/15/18 7. RECURRENT SYMPTOM: "Have you ever had this type of abdominal pain before?" If so, ask: "When was the last time?" and "What happened that time?"      no 8. CAUSE: "What do you think is causing the abdominal pain?"     No idea 9. RELIEVING/AGGRAVATING FACTORS: "What makes it better or worse?" (e.g., movement, antacids, bowel movement)     Moving around has helped 10. OTHER SYMPTOMS: "Has there been any vomiting, diarrhea, constipation, or urine problems?"       no 11. PREGNANCY: "Is there any chance you are pregnant?" "When was your last menstrual period?"       no  Protocols used: ABDOMINAL PAIN - [facility]

## 2018-09-15 NOTE — Progress Notes (Signed)
Subjective:    Patient ID: Hannah Bryant, female    DOB: [birth year], 72 y.o.   MRN: [id number]  HPI The patient is here for an acute visit.  Upper abdominal pain:  Last night she had a fullness in her upper abdomen and she was ok when she went to bed.  This morning when she woke up she had pain in her epigastric region, 4-5/10.  She got up and moved around and she felt better. It got better on its own.  It is still there, but less intense.      Her bowels have been normal.  She denies reflux or nausea.  She denies dysphagia.  2000 and 9476 she had fundoplication surgery.    She did have dysphagia symptoms in 2016 and work-up as below.  It was thought that the dysphasia symptoms were secondary to the fundoplication wrap, which had did respond at that time to esophageal dilation.  EGD 2016: Dilated esophagus with retained food and tight lower esophageal sphincter.  Hiatal hernia present.  Lower esophagus dilated.  Esophageal manometry did not demonstrate achalasia.  CT scan in 2017 showed dilated lower esophagus.    She still has her GB.    Medications and allergies reviewed with patient and updated if appropriate.  Patient Active Problem List   Diagnosis Date Noted  . Right rotator cuff tear 05/11/2017  . Cyst of joint of shoulder 03/04/2017  . Acute pain of right shoulder 02/06/2017  . Left sided abdominal pain 03/25/2016  . Type 2 diabetes mellitus with hypoglycemia without coma, without long-term current use of insulin ([place]) 02/28/2016  . Reactive hypoglycemia 02/28/2016  . Retrocalcaneal bursitis 02/26/2016  . Osteoporosis 01/09/2016  . Essential hypertension, benign 01/09/2016  . Achilles tendinosis 06/22/2015  . Junctional nevus of left thigh 12/23/2014  . Recurrent abdominal pain 11/28/2014  . Left rotator cuff tear arthropathy 08/21/2014  . Squamous cell skin cancer 10/06/2013  . BENIGN POSITIONAL VERTIGO 08/27/2009  . History of colonic polyps 05/09/2008  .  Gastroparesis 11/15/2007  . DIVERTICULOSIS, COLON 11/15/2007  . GASTROESOPHAGEAL REFLUX DISEASE, SEVERE 05/12/2007  . Nonspecific abnormal electrocardiogram (ECG) (EKG) 05/12/2007  . Hyperlipidemia 05/07/2007    Current Outpatient Medications on File Prior to Visit  Medication Sig Dispense Refill  . acetaminophen (TYLENOL ARTHRITIS PAIN) 650 MG CR tablet Take 650 mg by mouth. 2 by mouth 2 times daily     . aspirin 81 MG chewable tablet Chew 81 mg by mouth daily.    Marland Kitchen atorvastatin (LIPITOR) 40 MG tablet TAKE 1 TABLET BY MOUTH DAILY 90 tablet 3  . Calcium-Vitamin D-Vitamin K (VIACTIV PO) Take by mouth. Reported on 02/28/2016    . Continuous Blood Gluc Sensor (FREESTYLE LIBRE 14 DAY SENSOR) MISC 1 each by Does not apply route every 14 (fourteen) days. Change every 2 weeks 2 each 11  . DENTA 5000 PLUS 1.1 % CREA dental cream Take 1 Applicatorful by mouth See admin instructions.  0  . esomeprazole (NEXIUM) 40 MG capsule Take 1 capsule (40 mg total) by mouth daily. --- Needs office visit before further refills   30 capsule 2  . fexofenadine (ALLEGRA) 180 MG tablet Take 180 mg by mouth daily.      . fluticasone (FLONASE) 50 MCG/ACT nasal spray Place 2 sprays into both nostrils daily. 16 g 0  . hydrochlorothiazide (MICROZIDE) 12.5 MG capsule Take 1 capsule (12.5 mg total) by mouth daily. 90 capsule 3  . hyoscyamine (LEVSIN SL) 0.125 MG  SL tablet DISSOLVE ONE OR TWO TABLETS UNDER TONGUE EVERY 4-6 HOURS AS NEEDED FOR ABDOMINAL PAIN AND CRAMPING 300 tablet 2  . hyoscyamine (LEVSIN, ANASPAZ) 0.125 MG tablet Take 0.125 mg by mouth every 4 (four) hours as needed.    . Lancets MISC by Does not apply route. Lifespan ultra 2 test strips and lancets     . meclizine (ANTIVERT) 12.5 MG tablet Take 1-2 tablets (12.5-25 mg total) by mouth 3 (three) times daily as needed for dizziness. 60 tablet 0  . montelukast (SINGULAIR) 10 MG tablet TAKE 1 TABLET BY MOUTH EVERY DAY AS NEEDED 90 tablet 3  . OVER THE COUNTER  MEDICATION multistrain probiotic    . promethazine (PHENERGAN) 25 MG tablet Take 1 tablet (25 mg total) by mouth every 8 (eight) hours as needed for nausea or vomiting. 30 tablet 0  . mupirocin nasal ointment (BACTROBAN NASAL) 2 % Place 1 application into the nose 2 (two) times daily for 5 days. After application, press sides of nose together and gently massage. 10 g 0   No current facility-administered medications on file prior to visit.     Past Medical History:  Diagnosis Date  . Abnormal finding on EKG    non specific T changes; short PR  . Arthritis   . Bursitis    tronchanteric  . Complication of anesthesia    Severe PONV  . Diabetes mellitus without complication ([place])    Pt states she is pre-diabetic (12-06-13)  . Diverticulosis   . Gastroparesis    93 % [place] at 2 hrs  . GERD (gastroesophageal reflux disease)   . H/O dilation and curettage   . Hernia    hiatal  . Hiatal hernia   . Hyperlipidemia   . Hyperlipidemia   . hypoglycemia   . 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 CARDIAC CATHETERIZATION  2005   < 30 % lesion  . COLONOSCOPY W/ POLYPECTOMY  2006 ,2007 & 2015   negative 2009; Dr Fuller Plan  . DILATATION & CURETTAGE/HYSTEROSCOPY WITH TRUECLEAR N/A 01/11/2014   Procedure: DILATATION & CURETTAGE/HYSTEROSCOPY WITH TRUCLEAR;  Surgeon: Anastasio Auerbach, MD;  Location: [facility];  Service: Gynecology;  Laterality: N/A;  . ESOPHAGEAL MANOMETRY  03/22/15   Decreased peristalsis  . Esophagram  03/29/15   Persistent narrowing at site of hiatal hernia; proximal dilation  . G 3 P 3    . HIATAL HERNIA REPAIR  2000  . LEG SURGERY     Trauma LLE/pins and plates  . myomectomy  1999   uterine fibroid  . TUBAL LIGATION    . Upper endoscopy and esophageal dilation  02/20/15   Dr Roney Mans , [facility]    Social History   Socioeconomic History  . Marital status: Married    Spouse name: Not on file  .  Number of children: 2  . Years of education: Not on file  . Highest education level: Not on file  Occupational History    Employer: UNEMPLOYED  Social Needs  . Financial resource strain: Not on file  . Food insecurity:    Worry: Not on file    Inability: Not on file  . Transportation needs:    Medical: Not on file    Non-medical: Not on file  Tobacco Use  . Smoking status: Former Smoker    Types: Cigarettes    Last attempt to quit: 11/17/1980    Years  since quitting: 37.8  . Smokeless tobacco: Never Used  . Tobacco comment: smoked 1962-1982, up to 1 ppd  Substance and Sexual Activity  . Alcohol use: Yes    Alcohol/week: 2.0 standard drinks    Types: 2 Glasses of wine per week    Comment: socially,2 / week  . Drug use: No  . Sexual activity: Yes    Birth control/protection: Post-menopausal  Lifestyle  . Physical activity:    Days per week: Not on file    Minutes per session: Not on file  . Stress: Not on file  Relationships  . Social connections:    Talks on phone: Not on file    Gets together: Not on file    Attends religious service: Not on file    Active member of club or organization: Not on file    Attends meetings of clubs or organizations: Not on file    Relationship status: Not on file  Other Topics Concern  . Not on file  Social History Narrative   Daily caffeine use   Married   Veterinary surgeon intermittently    Family History  Problem Relation Age of Onset  . Ulcers Father   . Diabetes Father   . Hypertension Father   . Heart attack Father        > 26  . Stroke Father        in 67s  . Heart attack Mother 34  . Diabetes Sister   . Heart attack Sister 41       CABG X 4  . Cancer Neg Hx   . Colitis Neg Hx     Review of Systems  Constitutional: Negative for chills and fever.  HENT: Negative for trouble swallowing.   Respiratory: Negative for shortness of breath.   Cardiovascular: Negative for chest pain.  Gastrointestinal: Positive  for abdominal pain. Negative for constipation, diarrhea and nausea.       No reflux       Objective:   Vitals:   09/15/18 1033  BP: (!) 156/92  Pulse: 64  Resp: 16  Temp: 98.4 F (36.9 C)  SpO2: 97%   BP Readings from Last 3 Encounters:  09/15/18 (!) 156/92  06/07/18 140/78  05/25/18 136/86   Wt Readings from Last 3 Encounters:  09/15/18 153 lb (69.4 kg)  06/07/18 148 lb 9.6 oz (67.4 kg)  05/25/18 150 lb (68 kg)   Body mass index is 27.1 kg/m.   Physical Exam  Constitutional: She appears well-developed and well-nourished.  Non-toxic appearance. She does not appear ill.  HENT:  Head: Normocephalic and atraumatic.  Abdominal: There is tenderness in the right upper quadrant and epigastric area. There is no rigidity, no rebound, no guarding, no tenderness at McBurney's point and negative Murphy's sign. No hernia.           Assessment & Plan:    See Problem List for Assessment and Plan of chronic medical problems.

## 2018-09-15 NOTE — Patient Instructions (Addendum)
  Tests ordered today. Your results will be released to [facility] (or called to you) after review, usually within 72hours after test completion. If any changes need to be made, you will be notified at that same time.    Medications reviewed and updated.  Changes include :   none   An Ct scan was ordered.

## 2018-09-16 ENCOUNTER — Encounter: Payer: Self-pay | Admitting: Gastroenterology

## 2018-09-16 ENCOUNTER — Ambulatory Visit (INDEPENDENT_AMBULATORY_CARE_PROVIDER_SITE_OTHER): Payer: Medicare Other | Admitting: Gastroenterology

## 2018-09-16 VITALS — BP 142/80 | HR 68 | Ht 61.52 in | Wt 149.0 lb

## 2018-09-16 DIAGNOSIS — K219 Gastro-esophageal reflux disease without esophagitis: Secondary | ICD-10-CM

## 2018-09-16 DIAGNOSIS — R1011 Right upper quadrant pain: Secondary | ICD-10-CM

## 2018-09-16 DIAGNOSIS — R933 Abnormal findings on diagnostic imaging of other parts of digestive tract: Secondary | ICD-10-CM

## 2018-09-16 DIAGNOSIS — R1013 Epigastric pain: Secondary | ICD-10-CM | POA: Diagnosis not present

## 2018-09-16 NOTE — Progress Notes (Signed)
History of Present Illness: This is a 72 referred by Hannah Rail, MD for the evaluation of acute RUQ, epigastric pain.  She is accompanied by Hannah Bryant.  She has a history of GERD, prior Nissen fundoplication.  She was followed previously followed by me and then transferred Hannah GI care to Dr. Virgia Land at [facility].  In 2018 she requested to return here for Hannah GI care and she was due for surveillance colonoscopy which she did not schedule.  She had an extensive evaluation by Dr. Roney Mans that I reviewed today.  Yesterday she developed epigastric pain that persisted for several hours and then gradually completely resolved.  She was evaluated by [name] yesterday and had blood work which was unremarkable and abdominal/pelvic CT scan was performed with findings below.  Hannah epigastric pain completely resolved by the end of the day.  She has noted very mild right upper quadrant pain today.  She is eating light meals as food has exacerbated Hannah symptoms. Denies weight loss, constipation, diarrhea, change in stool caliber, melena, hematochezia, nausea, vomiting, dysphagia, chest pain.   Abd/pelvic CT impression 1. Dilated and fluid-filled esophagus to the level of a Nissen fundoplication. Findings may be due to a stricture. No additional findings to explain the patient's pain. 2. Hepatic steatosis. 3.  Aortic atherosclerosis (ICD10-170.0).  CBC, LFTs yesterday were normal  EGD 02/2015 Dr. Virgia Land tight LES and HH, esophageal dilation performed with [facility] dilator  Esophageal manometry 03/2015 [facility] LES pressure at lower limit of normal, weak peristalsis, delayed bolus clearance   Allergies  Allergen Reactions  . Domperidone     Stomach cramps  . Fentanyl Nausea And Vomiting  . Monistat [Miconazole Nitrate]     SEVERE BURNING  . Nsaids     REACTION: stomach complaints  . Oxycodone Hcl     REACTION: VOMITTING  . Codeine     nausea  . Erythromycin Ethylsuccinate     gastritis    Outpatient Medications Prior to Visit  Medication Sig Dispense Refill  . acetaminophen (TYLENOL ARTHRITIS PAIN) 650 MG CR tablet Take 650 mg by mouth. 2 by mouth 2 times daily     . aspirin 81 MG chewable tablet Chew 81 mg by mouth daily.    Marland Kitchen atorvastatin (LIPITOR) 40 MG tablet TAKE 1 TABLET BY MOUTH DAILY 90 tablet 3  . Calcium-Vitamin D-Vitamin K (VIACTIV PO) Take by mouth. Reported on 02/28/2016    . Continuous Blood Gluc Sensor (FREESTYLE LIBRE 14 DAY SENSOR) MISC 1 each by Does not apply route every 14 (fourteen) days. Change every 2 weeks 2 each 11  . DENTA 5000 PLUS 1.1 % CREA dental cream Take 1 Applicatorful by mouth See admin instructions.  0  . esomeprazole (NEXIUM) 40 MG capsule Take 1 capsule (40 mg total) by mouth daily. --- Needs office visit before further refills   30 capsule 2  . fexofenadine (ALLEGRA) 180 MG tablet Take 180 mg by mouth daily.      . hydrochlorothiazide (MICROZIDE) 12.5 MG capsule Take 1 capsule (12.5 mg total) by mouth daily. 90 capsule 3  . hyoscyamine (LEVSIN SL) 0.125 MG SL tablet DISSOLVE ONE OR TWO TABLETS UNDER TONGUE EVERY 4-6 HOURS AS NEEDED FOR ABDOMINAL PAIN AND CRAMPING 300 tablet 2  . Lancets MISC by Does not apply route. Lifespan ultra 2 test strips and lancets     . meclizine (ANTIVERT) 12.5 MG tablet Take 1-2 tablets (12.5-25 mg total) by mouth 3 (three) times  daily as needed for dizziness. 60 tablet 0  . montelukast (SINGULAIR) 10 MG tablet TAKE 1 TABLET BY MOUTH EVERY DAY AS NEEDED 90 tablet 3  . OVER THE COUNTER MEDICATION multistrain probiotic    . fluticasone (FLONASE) 50 MCG/ACT nasal spray Place 2 sprays into both nostrils daily. 16 g 0  . hyoscyamine (LEVSIN, ANASPAZ) 0.125 MG tablet Take 0.125 mg by mouth every 4 (four) hours as needed.    . mupirocin nasal ointment (BACTROBAN NASAL) 2 % Place 1 application into the nose 2 (two) times daily for 5 days. After application, press sides of nose together and gently massage. 10 g 0  .  promethazine (PHENERGAN) 25 MG tablet Take 1 tablet (25 mg total) by mouth every 8 (eight) hours as needed for nausea or vomiting. 30 tablet 0   No facility-administered medications prior to visit.    Past Medical History:  Diagnosis Date  . Abnormal finding on EKG    non specific T changes; short PR  . Arthritis   . Bursitis    tronchanteric  . Complication of anesthesia    Severe PONV  . Diabetes mellitus without complication ([place])    Pt states she is pre-diabetic (12-06-13)  . Diverticulosis   . Gastroparesis    93 % retenton at 2 hrs  . GERD (gastroesophageal reflux disease)   . H/O dilation and curettage   . Hernia    hiatal  . Hiatal hernia   . Hyperlipidemia   . Hyperlipidemia   . hypoglycemia   . 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 CARDIAC CATHETERIZATION  2005   < 30 % lesion  . COLONOSCOPY W/ POLYPECTOMY  2006 ,2007 & 2015   negative 2009; Dr Fuller Plan  . DILATATION & CURETTAGE/HYSTEROSCOPY WITH TRUECLEAR N/A 01/11/2014   Procedure: DILATATION & CURETTAGE/HYSTEROSCOPY WITH TRUCLEAR;  Surgeon: Anastasio Auerbach, MD;  Location: [facility];  Service: Gynecology;  Laterality: N/A;  . ESOPHAGEAL MANOMETRY  03/22/15   Decreased peristalsis  . Esophagram  03/29/15   Persistent narrowing at site of hiatal hernia; proximal dilation  . G 3 P 3    . HIATAL HERNIA REPAIR  2000  . LEG SURGERY     Trauma LLE/pins and plates  . myomectomy  1999   uterine fibroid  . ROTATOR CUFF REPAIR Right 05/2017  . TRIGGER FINGER RELEASE Right   . TUBAL LIGATION    . Upper endoscopy and esophageal dilation  02/20/15   Dr Roney Mans , [facility]   Social History   Socioeconomic History  . Marital status: Married    Spouse name: Not on file  . Number of children: 2  . Years of education: Not on file  . Highest education level: Not on file  Occupational History    Employer: UNEMPLOYED  Social Needs  . Financial  resource strain: Not on file  . Food insecurity:    Worry: Not on file    Inability: Not on file  . Transportation needs:    Medical: Not on file    Non-medical: Not on file  Tobacco Use  . Smoking status: Former Smoker    Types: Cigarettes    Last attempt to quit: 11/17/1980    Years since quitting: 37.8  . Smokeless tobacco: Never Used  . Tobacco comment: smoked 1962-1982, up to 1 ppd  Substance and Sexual Activity  . Alcohol use: Yes    Alcohol/week:  2.0 standard drinks    Types: 2 Glasses of wine per week    Comment: socially,2 / week  . Drug use: No  . Sexual activity: Yes    Birth control/protection: Post-menopausal  Lifestyle  . Physical activity:    Days per week: Not on file    Minutes per session: Not on file  . Stress: Not on file  Relationships  . Social connections:    Talks on phone: Not on file    Gets together: Not on file    Attends religious service: Not on file    Active member of club or organization: Not on file    Attends meetings of clubs or organizations: Not on file    Relationship status: Not on file  Other Topics Concern  . Not on file  Social History Narrative   Daily caffeine use   Married   Veterinary surgeon intermittently   Family History  Problem Relation Age of Onset  . Ulcers Father   . Diabetes Father   . Hypertension Father   . Heart attack Father        > 21  . Stroke Father        in 32s  . Heart attack Mother 56  . Diabetes Sister   . Heart attack Sister 31       CABG X 4  . Cancer Neg Hx   . Colitis Neg Hx        Review of Systems: Pertinent positive and negative review of systems were noted in the above HPI section. All other review of systems were otherwise negative.   Physical Exam: General: Well developed, well nourished, no acute distress Head: Normocephalic and atraumatic Eyes:  sclerae anicteric, EOMI Ears: Normal auditory acuity Mouth: No deformity or lesions Neck: Supple, no masses or  thyromegaly Lungs: Clear throughout to auscultation Heart: Regular rate and rhythm; no murmurs, rubs or bruits Abdomen: Soft, minimal right upper quadrant tenderness to deep palpation and non distended. No masses, hepatosplenomegaly or hernias noted. Normal Bowel sounds Rectal: Not done Musculoskeletal: Symmetrical with no gross deformities  Skin: No lesions on visible extremities Pulses:  Normal pulses noted Extremities: No clubbing, cyanosis, edema or deformities noted Neurological: Alert oriented x 4, grossly nonfocal Cervical Nodes:  No significant cervical adenopathy Inguinal Nodes: No significant inguinal adenopathy Psychological:  Alert and cooperative. Normal mood and affect   Assessment and Recommendations:  1. Acute epigastric, RUQ pain.  Symptoms are resolving.  Suspected self limited process due to a virus or food.,  Low-fat, low fiber light diet with adequate fluid intake until symptoms resolved.  Increase hyoscyamine from 1 every 4 hours to 2 every 4 hours until symptoms abate.  Schedule RUQ [place].  2. Abnormal CT of esophagus. GERD.  She denies dysphagia or any change in Hannah chronic reflux symptoms.  She does not feel Hannah acute symptoms are reflux related as symptoms are very different from Hannah typical reflux.  CT findings of esophageal dilation with fluid are likely related to known GERD and abnormal esophageal motility. Abnormal esophageal motility and delayed esophageal clearance documented in 2016.  Dilated esophagus likely indicates worsening of Hannah esophageal motility. Continue antireflux measures and Nexium 40 mg po qd.  3. Personal history of adenomatous colon polyps. Overdue for surveillance colonoscopy. She is aware and is not ready to schedule at this time however she states she will call to schedule colonoscopy.    cc: Hannah Rail, MD [place]  [place], [place] [address]

## 2018-09-16 NOTE — Patient Instructions (Addendum)
Increase your hyoscyamine to 2 tablets by mouth every 4 hours. You already have the prescription at home.   Remain on a low fiber and low fat diet.   You have been scheduled for an abdominal ultrasound at [facility] (1st floor of hospital) on 09/20/18 at 8:00am. Please arrive 15 minutes prior to your appointment for registration. Make certain not to have anything to eat or drink 6 hours prior to your appointment. Should you need to reschedule your appointment, please contact radiology at [phone]. This test typically takes about 30 minutes to perform.  Thank you for choosing me and [facility].  Pricilla Riffle. Dagoberto Ligas., MD., Marval Regal

## 2018-09-20 ENCOUNTER — Ambulatory Visit (HOSPITAL_COMMUNITY)
Admission: RE | Admit: 2018-09-20 | Discharge: 2018-09-20 | Disposition: A | Discharge status: Home / Self Care | Payer: Medicare Other | Source: Ambulatory Visit | Attending: Gastroenterology | Admitting: Gastroenterology

## 2018-09-20 DIAGNOSIS — R932 Abnormal findings on diagnostic imaging of liver and biliary tract: Secondary | ICD-10-CM | POA: Insufficient documentation

## 2018-09-20 DIAGNOSIS — R1013 Epigastric pain: Secondary | ICD-10-CM | POA: Insufficient documentation

## 2018-09-20 DIAGNOSIS — R1011 Right upper quadrant pain: Secondary | ICD-10-CM | POA: Diagnosis not present

## 2018-09-20 DIAGNOSIS — R143 Flatulence: Secondary | ICD-10-CM | POA: Insufficient documentation

## 2018-09-24 ENCOUNTER — Ambulatory Visit: Payer: Medicare Other | Admitting: Internal Medicine

## 2018-09-24 ENCOUNTER — Encounter: Payer: Self-pay | Admitting: Internal Medicine

## 2018-09-27 ENCOUNTER — Telehealth: Payer: Self-pay | Admitting: Dietician

## 2018-09-27 ENCOUNTER — Encounter: Payer: Self-pay | Admitting: Internal Medicine

## 2018-09-27 ENCOUNTER — Ambulatory Visit (INDEPENDENT_AMBULATORY_CARE_PROVIDER_SITE_OTHER): Payer: Medicare Other | Admitting: Internal Medicine

## 2018-09-27 VITALS — BP 110/70 | HR 58 | Ht 61.52 in | Wt 149.0 lb

## 2018-09-27 DIAGNOSIS — E559 Vitamin D deficiency, unspecified: Secondary | ICD-10-CM | POA: Diagnosis not present

## 2018-09-27 DIAGNOSIS — E161 Other hypoglycemia: Secondary | ICD-10-CM

## 2018-09-27 DIAGNOSIS — E11649 Type 2 diabetes mellitus with hypoglycemia without coma: Secondary | ICD-10-CM | POA: Diagnosis not present

## 2018-09-27 DIAGNOSIS — M810 Age-related osteoporosis without current pathological fracture: Secondary | ICD-10-CM

## 2018-09-27 LAB — VITAMIN D 25 HYDROXY (VIT D DEFICIENCY, FRACTURES): VITD: 33.78 ng/mL (ref 30.00–100.00)

## 2018-09-27 MED ORDER — GLUCOSE BLOOD VI STRP: ORAL_STRIP | 12 refills | Status: DC

## 2018-09-27 NOTE — Patient Instructions (Signed)
Please stop at the lab.  Please come back for a follow-up appointment in 6 months.  

## 2018-09-27 NOTE — Telephone Encounter (Signed)
Called patient related to a nutrition appointment.  She will see me 11/05/18. Encouraged consistent meal schedule.  Antonieta Iba, RD, LDN, CDE

## 2018-09-27 NOTE — Progress Notes (Signed)
Patient ID: MARGERITE IMPASTATO, female   DOB: January 15, 1946, 72 y.o.   MRN: [id number]  HPI: TAMMEY DEEG is a 72 y.o.-year-old female, returning for f/u for DM2, dx in ~2009 (or earlier), diet-controlled, with complications (reactive hypoglycemia) and osteoporosis.  Previously seen by Dr. Chalmers Cater. Last 06/2016.  Last visit with me 6 months ago.  She was in [place] this summer.  Reactive hypoglycemia:  Reviewed history: Pt described that has had hypoglycemia every pm for "ages". Sometimes: sweating, feeling hot, nausea, sometimes vomiting.  She may need to take a nap afterward.  She started to see Dr Roney Mans ([place]) >> hypoglycemic episodes = better. She had an EGD, Ba swallow test >> she had a stricture at the site of her previous hiatal hernia with food retention in the Esophagus long after she eats (procedure: Spring 2016). She had Esophageal dilation. She also had a capsule endoscopy.  Her hypoglycemia improved after esophageal stretching.  Lowest CBG were previously in the 50s but lately in the 60s.  She corrects these with OJ or honey sticks. Lunch is a sandwich/wrap + yoghurt at 1 pm and snack at 3 pm (cheese or PB+apples or PB crackers), supper at 7:30 pm.  She is still trying to get 10,000 steps in (has a fitbit), but not quite daily.  DM2:  Last hemoglobin A1c was: Lab Results  Component Value Date   HGBA1C 6.5 09/15/2018   HGBA1C 6.4 10/12/2017   HGBA1C 6.2 04/01/2017   She was previously on medication for her diabetes but she stopped that she was not feeling well on it.  She would not want to restart.  She checks her sugars many times with her freestyle libre CGM CGM parameters: - Average from CGM: 144+/-26 >> 130 +/- 28.1 >> 133 +/- 26.3, coeff of var. 19.8%  Time in range:  - very low (<54): 0% >> 0% - low (54-70): 0% >> 1% >> 1% - normal range (70-180): 93 >> 93% >> 93% - high sugars (180-250): 7% >> 6% >> 6% - very high sugars (250-400): 0% >> 0%  Lowest sugar  was: 45 (CGM) x1 >> 40s (CGM). She has hypoglycemia awareness in the 80s. Highest sugar was 261, 213 >> 200s.  Glucometer: 4: OneTouch Verio and Freestyle  She saw our nutritionist, Antonieta Iba. Last visit 02/2015  -No CKD, last BUN/creatinine:  Lab Results  Component Value Date   BUN 14 09/15/2018   CREATININE 0.72 09/15/2018   -+ HL; last set of lipids: Lab Results  Component Value Date   CHOL 150 09/15/2018   HDL 64.30 09/15/2018   LDLCALC 61 09/15/2018   TRIG 122.0 09/15/2018   CHOLHDL 2 09/15/2018  On Lipitor.  Reviewed her latest DXA scan reports together: Osteopenia, history of fragility fractures so this would qualify as osteoporosis: 02/08/2017 Lumbar spine (L1-L4) Femoral neck (FN)  T-score 0.6 RFN:-2.0 LFN:-1.6  Change in BMD from previous DXA test (%) Down 2.0% Down 4.3%   She has a history of a humeral fracture in 01/2017 (however, this was not seen on an x-ray...).  At last visit, I suggested Prolia and we discussed about benefits and possible side effects. At that time, she was preparing to have dental implants (had 3 implants recently) and wanted to wait until these were complete before deciding for Prolia.  At last visit she preferred to wait for a new DXA scan in 01/2019 before deciding for the medication.  She has a history of vitamin deficiency, latest  level was also low: Lab Results  Component Value Date   VD25OH 24.24 (L) 03/24/2018   VD25OH 28.68 (L) 03/04/2017  We started vitamin D 2000 units daily.  ROS: Constitutional: + weight gain/no weight loss,+ fatigue, no subjective hyperthermia, no subjective hypothermia Eyes: no blurry vision, no xerophthalmia ENT: no sore throat, no nodules palpated in neck, no dysphagia, no odynophagia, no hoarseness Cardiovascular: no CP/no SOB/no palpitations/no leg swelling Respiratory: no cough/no SOB/no wheezing Gastrointestinal: no N/no V/no D/no C/+ acid reflux Musculoskeletal: + muscle aches/no joint  aches Skin: no rashes, no hair loss Neurological: no tremors/no numbness/no tingling/no dizziness  I reviewed pt's medications, allergies, PMH, social hx, family hx, and changes were documented in the history of present illness. Otherwise, unchanged from my initial visit note.  History   Social History  . Marital Status: Married    Spouse Name: N/A  . Number of Children: 2   Social History Main Topics  . Smoking status: Former Smoker    Types: Cigarettes    Quit date: 11/17/1980  . Smokeless tobacco: Never Used     Comment: smoked 1962-1982, up to 1 ppd  . Alcohol Use: 1.2 oz/week    2 Glasses of wine per week     Comment: socially,2 / week  . Drug Use: No  . Sexual Activity: Yes    Birth Control/ Protection: Post-menopausal   Social History Narrative   Daily caffeine use   Married   Veterinary surgeon intermittently   Past Medical History:  Diagnosis Date  . Abnormal finding on EKG    non specific T changes; short PR  . Arthritis   . Bursitis    tronchanteric  . Complication of anesthesia    Severe PONV  . Diabetes mellitus without complication ([place])    Pt states she is pre-diabetic (12-06-13)  . Diverticulosis   . Gastroparesis    93 % retenton at 2 hrs  . GERD (gastroesophageal reflux disease)   . H/O dilation and curettage   . Hernia    hiatal  . Hiatal hernia   . Hyperlipidemia   . Hyperlipidemia   . hypoglycemia   . 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 CARDIAC CATHETERIZATION  2005   < 30 % lesion  . COLONOSCOPY W/ POLYPECTOMY  2006 ,2007 & 2015   negative 2009; Dr Fuller Plan  . DILATATION & CURETTAGE/HYSTEROSCOPY WITH TRUECLEAR N/A 01/11/2014   Procedure: DILATATION & CURETTAGE/HYSTEROSCOPY WITH TRUCLEAR;  Surgeon: Anastasio Auerbach, MD;  Location: [facility];  Service: Gynecology;  Laterality: N/A;  . ESOPHAGEAL MANOMETRY  03/22/15   Decreased peristalsis  .  Esophagram  03/29/15   Persistent narrowing at site of hiatal hernia; proximal dilation  . G 3 P 3    . HIATAL HERNIA REPAIR  2000  . LEG SURGERY     Trauma LLE/pins and plates  . myomectomy  1999   uterine fibroid  . ROTATOR CUFF REPAIR Right 05/2017  . TRIGGER FINGER RELEASE Right   . TUBAL LIGATION    . Upper endoscopy and esophageal dilation  02/20/15   Dr Roney Mans , [facility]   Current Outpatient Medications on File Prior to Visit  Medication Sig Dispense Refill  . acetaminophen (TYLENOL ARTHRITIS PAIN) 650 MG CR tablet Take 650 mg by mouth. 2 by mouth 2 times daily     . aspirin 81 MG chewable tablet Chew 81 mg by mouth  daily.    . atorvastatin (LIPITOR) 40 MG tablet TAKE 1 TABLET BY MOUTH DAILY 90 tablet 3  . Calcium-Vitamin D-Vitamin K (VIACTIV PO) Take by mouth. Reported on 02/28/2016    . Continuous Blood Gluc Sensor (FREESTYLE LIBRE 14 DAY SENSOR) MISC 1 each by Does not apply route every 14 (fourteen) days. Change every 2 weeks 2 each 11  . DENTA 5000 PLUS 1.1 % CREA dental cream Take 1 Applicatorful by mouth See admin instructions.  0  . esomeprazole (NEXIUM) 40 MG capsule Take 1 capsule (40 mg total) by mouth daily. --- Needs office visit before further refills   30 capsule 2  . fexofenadine (ALLEGRA) 180 MG tablet Take 180 mg by mouth daily.      . hydrochlorothiazide (MICROZIDE) 12.5 MG capsule Take 1 capsule (12.5 mg total) by mouth daily. 90 capsule 3  . hyoscyamine (LEVSIN SL) 0.125 MG SL tablet DISSOLVE ONE OR TWO TABLETS UNDER TONGUE EVERY 4-6 HOURS AS NEEDED FOR ABDOMINAL PAIN AND CRAMPING 300 tablet 2  . Lancets MISC by Does not apply route. Lifespan ultra 2 test strips and lancets     . meclizine (ANTIVERT) 12.5 MG tablet Take 1-2 tablets (12.5-25 mg total) by mouth 3 (three) times daily as needed for dizziness. 60 tablet 0  . montelukast (SINGULAIR) 10 MG tablet TAKE 1 TABLET BY MOUTH EVERY DAY AS NEEDED 90 tablet 3  . OVER THE COUNTER MEDICATION multistrain probiotic      No current facility-administered medications on file prior to visit.    Allergies  Allergen Reactions  . Domperidone     Stomach cramps  . Fentanyl Nausea And Vomiting  . Monistat [Miconazole Nitrate]     SEVERE BURNING  . Nsaids     REACTION: stomach complaints  . Oxycodone Hcl     REACTION: VOMITTING  . Codeine     nausea  . Erythromycin Ethylsuccinate     gastritis   Family History  Problem Relation Age of Onset  . Ulcers Father   . Diabetes Father   . Hypertension Father   . Heart attack Father        > 28  . Stroke Father        in 46s  . Heart attack Mother 16  . Diabetes Sister   . Heart attack Sister 73       CABG X 4  . Cancer Neg Hx   . Colitis Neg Hx    PE: BP 110/70   Pulse (!) 58   Ht 5' 1.52" (1.563 m) Comment: measured  Wt 149 lb (67.6 kg)   LMP 11/18/2000   SpO2 99%   BMI 27.68 kg/m  Body mass index is 27.68 kg/m. Wt Readings from Last 3 Encounters:  09/27/18 149 lb (67.6 kg)  09/16/18 149 lb (67.6 kg)  09/15/18 153 lb (69.4 kg)   Constitutional: Normal weight, in NAD Eyes: PERRLA, EOMI, no exophthalmos ENT: moist mucous membranes, no thyromegaly, no cervical lymphadenopathy Cardiovascular: RRR, No MRG Respiratory: CTA B Gastrointestinal: abdomen soft, NT, ND, BS+ Musculoskeletal: no deformities, strength intact in all 4 Skin: moist, warm, no rashes Neurological: no tremor with outstretched hands, DTR normal in all 4  ASSESSMENT: 1. Diet-controlled DM2, without complications.  2. Hypoglycemia - possibly reactive - improved after her esophageal stretching - We ruled out adrenal insufficiency: Component     Latest Ref Rng 02/02/2015 02/02/2015 02/02/2015         9:21 AM 10:02 AM 10:02 AM  Cortisol, Plasma      7.1 29.1 24.2  C206 ACTH     6 - 50 pg/mL 12     3.  Osteopenia - + h/o Humeral fracture  4.  Vitamin D insufficiency  PLAN:  1. DM2, fairly well controlled -Diet controlled -She continues to have an hyperglycemic  spike after correction of lowsNo recent lows <60. -She does have a history of gastroparesis and this may contribute to her previous diagnosis of postprandial reactive hypoglycemia -We downloaded her CGM traces and discussed about her fluctuating blood sugars.  I also explained that in the low range, the CBGs checked by her meter may be 20 points higher than those shown by the CGM -She had a recent HbA1c that was slightly higher, at 6.5%: Lab Results  Component Value Date   HGBA1C 6.5 09/15/2018   HGBA1C 6.4 10/12/2017  -We will continue to check sugars at different times of the day with a CGM -No treatment needed for now, but continue to pay attention to the diet and limit fatty foods and concentrated sweets.  - Return to clinic in 6 mo with sugar log   2. Patient with history of reactive (postprandial) hypoglycemia -Please see above -In the past, she had sugars in the 50s, but recently higher than 60 (40s on the CGM) -We discussed again about limiting liquids with meals.  In the past her low blood sugars episode after dinner, which was her largest meal and she was drinking milk afterwards. Will refer to nutrition again.   3. Osteopenia + fracture >> osteoporosis -Likely age-related + postmenopausal -She has osteopenia on her latest bone density scan and a history of humeral fracture.  I suggested Prolia and we started the PA for this, however, she wanted to finish dental work starting.  She was still reticent to start Prolia her healing from her dental implants was complete. -We decided to get another bone density scan next spring and decide at that time whether to start Prolia or not, depending on the T-scores.  4.  Vitamin D insufficiency -At last visit we checked a vitamin D level and this was slightly low so we started 2000 units vitamin D3 -We will recheck her vitamin D today  Office Visit on 09/27/2018  Component Date Value Ref Range Status  . VITD 09/27/2018 33.78  30.00 - 100.00  ng/mL Final   Normal vitamin D.   Philemon Kingdom, MD PhD [facility]

## 2018-09-29 DIAGNOSIS — Z9889 Other specified postprocedural states: Secondary | ICD-10-CM | POA: Diagnosis not present

## 2018-09-29 DIAGNOSIS — M65341 Trigger finger, right ring finger: Secondary | ICD-10-CM | POA: Diagnosis not present

## 2018-10-18 ENCOUNTER — Telehealth: Payer: Self-pay | Admitting: Gastroenterology

## 2018-10-18 MED ORDER — DICYCLOMINE HCL 10 MG PO CAPS: 10.0000 mg | ORAL_CAPSULE | Freq: Three times a day (TID) | ORAL | 1 refills | Status: DC

## 2018-10-18 NOTE — Telephone Encounter (Signed)
Dicyclomine 20 mg po qid, ac & hs IB gard 1-2 po tid prn  DC Levsin

## 2018-10-18 NOTE — Telephone Encounter (Signed)
Patient reports that she continues to have left sided abdominal pain despite the increase in hyoscyamine to two sl tablets q 4 hours.  Pain is daily and nothing makes it better. Pain is on the left side. She does not experience when she lays down, she has it when she stands. She asking for the next step.

## 2018-10-18 NOTE — Telephone Encounter (Signed)
Patient notified of the recommendations.  

## 2018-10-20 ENCOUNTER — Ambulatory Visit: Payer: Self-pay

## 2018-10-20 NOTE — Telephone Encounter (Signed)
Follow-up call to pt.  Spoke with husband. Stated he notified the GI office about nausea and dizziness; no recommendation given for her symptoms.  Stated the nausea is somewhat improved from previous.  Advised on signs of dehydration.  Advised to take to ER if worsening sx's, ie: increased nausea and vomiting, worsening dizziness, weakness, decreased urine output, abdominal pain, or fever.  Appt. Scheduled for 10/22/18 at PCP office.  Husband verb. Understanding, and agrees with plan.

## 2018-10-20 NOTE — Telephone Encounter (Signed)
Patients husband states pt has had a serious reaction to medication dicyclomine and has been dry heaving since 3am this morning. Patients husband wanting to know what to do for patient.

## 2018-10-20 NOTE — Telephone Encounter (Signed)
Patient took 2 doses of dicyclomine yesterday/  She has not had any since lunch.  She had some dizziness last night and this am had "dry heaves" and dizziness.  Patient's husband advised that it is hard to stay if her symptoms are related to dicyclomine, but doubtful.  Patient has a history of vertigo and takes meclizine prn for this.  She is asking for a shot for nausea.  They are advised that we do not have that here and that she should go to urgent care.  They will follow up after urgent care visit if rx needs to be changed.

## 2018-10-20 NOTE — Telephone Encounter (Signed)
Phone call to pt's husband.  Reported pt. started having c/o dizziness and nausea yesterday afternoon.  Stated she last ate a meal, yesterday at lunch.  Stated the symptoms started a few hrs. after lunch.  Reported severe, constant nausea.  C/o dry heaves; reported has actually vomited very small amt., and mostly dry heaves.  Tried to drink water this morning at 8:00 AM, and vomited it.  Stated she started on Dicyclomine yesterday, per order of Dr. Fuller Plan, Gastroenterology, due to left abdominal pain.  Reported her abdominal pain has improved.  Husband stated the pt. Took 2 doses of Dicyclomine on 12/3; took 1 tab before bkfst, and 1 tab before lunch.  Noted that common side effects of  Dicyclomine are nausea and dizziness.  Husband is requesting medication for her nausea.  Recommended that he contact Dr. Lynne Leader office, initially, to make him aware of her sx's, since she started the Dicyclomine.  Encouraged him to call back if he wants to schedule appt. with PCP, after speaking with the GI office.  Husband agreed with plan.          Reason for Disposition . Taking prescription medication that could cause nausea (e.g., narcotics/opiates, antibiotics, OCPs, many others)    Husband reported pt. started Dicyclomine on 10/19/18 for abdominal pain, per GI MD; had onset of nausea and dizziness a few hrs. after taking the 2nd dose of Dicyclomine.  Advised husband to report sx's to prescribing MD.  Answer Assessment - Initial Assessment Questions 1. NAUSEA SEVERITY: "How bad is the nausea?" (e.g., mild, moderate, severe; dehydration, weight loss)   - MILD: loss of appetite without change in eating habits   - MODERATE: decreased oral intake without significant weight loss, dehydration, or malnutrition   - SEVERE: inadequate caloric or fluid intake, significant weight loss, symptoms of dehydration     Severe; last drank small amt. Water at 8:00 AM and vomited it; has not had fluids since lunch yesterday 2. ONSET:  "When did the nausea begin?"     12/3 in afternoon  3. VOMITING: "Any vomiting?" If so, ask: "How many times today?"     Dry heaves  4. RECURRENT SYMPTOM: "Have you had nausea before?" If so, ask: "When was the last time?" "What happened that time?"     *No Answer* 5. CAUSE: "What do you think is causing the nausea?"     Unsure 6. PREGNANCY: "Is there any chance you are pregnant?" (e.g., unprotected intercourse, missed birth control pill, broken condom)     n/a  Protocols used: NAUSEA-A-AH  Message from [place] sent at 10/20/2018 8:58 AM EST   Summary: nausea   Pt's husband requesting a call back to discuss pt's extreme nausea. He states she has been experiencing dry heaves since 3am this morning. He is concerned about dehydration.   [phone]

## 2018-10-21 NOTE — Progress Notes (Signed)
Subjective:    Patient ID: Hannah Bryant, female    DOB: 1945/12/29, 72 y.o.   MRN: [id number]  HPI The patient is here for an acute visit for nausea.  Last week she had her first episode coming home from the beach.  She had a lot of dry heaves.  She does not think she was dizzy.  She thought it may have been from being in the back seat while in the car and maybe it was just motion sickness.  After a while she did feel better.  Her friends dropped her over the hotel and her husband came to pick her up and the next day she felt fine.   A couple of days ago she started having her second episode, which has been a little different.  She has chronic pain on the left side of her abdomen.  She states she has them daily.  She has been taking hyoscamine on a daily basis and that worked up until recently.  She called GI and they sent in dicyclomine.  She took it twice on Tuesday.  She felt dizzy later that day and went to bed.  In the middle of the night she felt nauseous and had dry heaves for hours.  She has been in bed since then.  She has eaten and drank minimally since then.    She took meclizine yesterday.   She has been taking nexium.  She feels dehydrated.  He feels weak and tired.  She does not feel well dizziness right now.  She does not feel nauseous right now.  She has no appetite.   She has left sided abdomen pain daily.  It is better when she was walking a lot.  She does not think it is related to bowel movmeents, gas , eating or activity.  She does state that often her pain is not present when she lays down, but it is better when she gets up and walks around.  She has felt it more radiating into the groin.  She denies any back pain.     Medications and allergies reviewed with patient and updated if appropriate.  Patient Active Problem List   Diagnosis Date Noted  . Vitamin D insufficiency 09/27/2018  . Right upper quadrant pain 09/15/2018  . Epigastric pain 09/15/2018  . Right  rotator cuff tear 05/11/2017  . Cyst of joint of shoulder 03/04/2017  . Acute pain of right shoulder 02/06/2017  . Left sided abdominal pain 03/25/2016  . Type 2 diabetes mellitus with hypoglycemia without coma, without long-term current use of insulin ([place]) 02/28/2016  . Reactive hypoglycemia 02/28/2016  . Retrocalcaneal bursitis 02/26/2016  . Osteoporosis 01/09/2016  . Essential hypertension, benign 01/09/2016  . Achilles tendinosis 06/22/2015  . Junctional nevus of left thigh 12/23/2014  . Recurrent abdominal pain 11/28/2014  . Left rotator cuff tear arthropathy 08/21/2014  . Squamous cell skin cancer 10/06/2013  . BENIGN POSITIONAL VERTIGO 08/27/2009  . History of colonic polyps 05/09/2008  . Gastroparesis 11/15/2007  . DIVERTICULOSIS, COLON 11/15/2007  . GASTROESOPHAGEAL REFLUX DISEASE, SEVERE 05/12/2007  . Nonspecific abnormal electrocardiogram (ECG) (EKG) 05/12/2007  . Hyperlipidemia 05/07/2007    Current Outpatient Medications on File Prior to Visit  Medication Sig Dispense Refill  . acetaminophen (TYLENOL ARTHRITIS PAIN) 650 MG CR tablet Take 650 mg by mouth. 2 by mouth 2 times daily     . aspirin 81 MG chewable tablet Chew 81 mg by mouth daily.    Marland Kitchen atorvastatin (  LIPITOR) 40 MG tablet TAKE 1 TABLET BY MOUTH DAILY 90 tablet 3  . Calcium-Vitamin D-Vitamin K (VIACTIV PO) Take by mouth. Reported on 02/28/2016    . Continuous Blood Gluc Sensor (FREESTYLE LIBRE 14 DAY SENSOR) MISC 1 each by Does not apply route every 14 (fourteen) days. Change every 2 weeks 2 each 11  . DENTA 5000 PLUS 1.1 % CREA dental cream Take 1 Applicatorful by mouth See admin instructions.  0  . dicyclomine (BENTYL) 10 MG capsule Take 1 capsule (10 mg total) by mouth 4 (four) times daily -  before meals and at bedtime. 120 capsule 1  . esomeprazole (NEXIUM) 40 MG capsule Take 1 capsule (40 mg total) by mouth daily. --- Needs office visit before further refills   30 capsule 2  . fexofenadine (ALLEGRA) 180  MG tablet Take 180 mg by mouth daily.      Marland Kitchen glucose blood (FREESTYLE TEST STRIPS) test strip Use as instructed 2x a day - for the Freestyle Libre CGM 100 each 12  . hydrochlorothiazide (MICROZIDE) 12.5 MG capsule Take 1 capsule (12.5 mg total) by mouth daily. 90 capsule 3  . Lancets MISC by Does not apply route. Lifespan ultra 2 test strips and lancets     . meclizine (ANTIVERT) 12.5 MG tablet Take 1-2 tablets (12.5-25 mg total) by mouth 3 (three) times daily as needed for dizziness. 60 tablet 0  . montelukast (SINGULAIR) 10 MG tablet TAKE 1 TABLET BY MOUTH EVERY DAY AS NEEDED 90 tablet 3  . OVER THE COUNTER MEDICATION multistrain probiotic     No current facility-administered medications on file prior to visit.     Past Medical History:  Diagnosis Date  . Abnormal finding on EKG    non specific T changes; short PR  . Arthritis   . Bursitis    tronchanteric  . Complication of anesthesia    Severe PONV  . Diabetes mellitus without complication ([place])    Pt states she is pre-diabetic (12-06-13)  . Diverticulosis   . Gastroparesis    93 % retenton at 2 hrs  . GERD (gastroesophageal reflux disease)   . H/O dilation and curettage   . Hernia    hiatal  . Hiatal hernia   . Hyperlipidemia   . Hyperlipidemia   . hypoglycemia   . 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 CARDIAC CATHETERIZATION  2005   < 30 % lesion  . COLONOSCOPY W/ POLYPECTOMY  2006 ,2007 & 2015   negative 2009; Dr Fuller Plan  . DILATATION & CURETTAGE/HYSTEROSCOPY WITH TRUECLEAR N/A 01/11/2014   Procedure: DILATATION & CURETTAGE/HYSTEROSCOPY WITH TRUCLEAR;  Surgeon: Anastasio Auerbach, MD;  Location: [facility];  Service: Gynecology;  Laterality: N/A;  . ESOPHAGEAL MANOMETRY  03/22/15   Decreased peristalsis  . Esophagram  03/29/15   Persistent narrowing at site of hiatal hernia; proximal dilation  . G 3 P 3    . HIATAL HERNIA REPAIR  2000  .  LEG SURGERY     Trauma LLE/pins and plates  . myomectomy  1999   uterine fibroid  . ROTATOR CUFF REPAIR Right 05/2017  . TRIGGER FINGER RELEASE Right   . TUBAL LIGATION    . Upper endoscopy and esophageal dilation  02/20/15   Dr Roney Mans , [facility]    Social History   Socioeconomic History  . Marital status: Married    Spouse name: Not on file  .  Number of children: 2  . Years of education: Not on file  . Highest education level: Not on file  Occupational History    Employer: UNEMPLOYED  Social Needs  . Financial resource strain: Not on file  . Food insecurity:    Worry: Not on file    Inability: Not on file  . Transportation needs:    Medical: Not on file    Non-medical: Not on file  Tobacco Use  . Smoking status: Former Smoker    Types: Cigarettes    Last attempt to quit: 11/17/1980    Years since quitting: 37.9  . Smokeless tobacco: Never Used  . Tobacco comment: smoked 1962-1982, up to 1 ppd  Substance and Sexual Activity  . Alcohol use: Yes    Alcohol/week: 2.0 standard drinks    Types: 2 Glasses of wine per week    Comment: socially,2 / week  . Drug use: No  . Sexual activity: Yes    Birth control/protection: Post-menopausal  Lifestyle  . Physical activity:    Days per week: Not on file    Minutes per session: Not on file  . Stress: Not on file  Relationships  . Social connections:    Talks on phone: Not on file    Gets together: Not on file    Attends religious service: Not on file    Active member of club or organization: Not on file    Attends meetings of clubs or organizations: Not on file    Relationship status: Not on file  Other Topics Concern  . Not on file  Social History Narrative   Daily caffeine use   Married   Veterinary surgeon intermittently    Family History  Problem Relation Age of Onset  . Ulcers Father   . Diabetes Father   . Hypertension Father   . Heart attack Father        > 63  . Stroke Father        in 42s  . Heart  attack Mother 14  . Diabetes Sister   . Heart attack Sister 28       CABG X 4  . Cancer Neg Hx   . Colitis Neg Hx     Review of Systems  Constitutional: Positive for fatigue. Negative for chills and fever.  HENT: Negative for hearing loss and tinnitus.   Gastrointestinal: Positive for abdominal pain (hurt yesterday) and nausea (no nausea today).  Neurological: Positive for dizziness and headaches.       Objective:   Vitals:   10/22/18 0934  BP: (!) 164/92  Pulse: (!) 59  Resp: 16  Temp: 98.5 F (36.9 C)  SpO2: 99%   BP Readings from Last 3 Encounters:  10/22/18 (!) 164/92  09/27/18 110/70  09/16/18 (!) 142/80   Wt Readings from Last 3 Encounters:  10/22/18 142 lb 12.8 oz (64.8 kg)  09/27/18 149 lb (67.6 kg)  09/16/18 149 lb (67.6 kg)   Body mass index is 26.53 kg/m.   Physical Exam  Constitutional: No distress.  Mildly ill appearing  HENT:  Head: Normocephalic and atraumatic.  Cardiovascular: Normal rate and regular rhythm.  Pulmonary/Chest: Effort normal and breath sounds normal. No respiratory distress. She has no wheezes. She has no rales.  Abdominal: Soft. She exhibits no distension and no mass. There is no tenderness. There is no rebound and no guarding.  Skin: Skin is warm and dry. She is not diaphoretic.  Assessment & Plan:    See Problem List for Assessment and Plan of chronic medical problems.

## 2018-10-22 ENCOUNTER — Encounter: Payer: Self-pay | Admitting: Internal Medicine

## 2018-10-22 ENCOUNTER — Ambulatory Visit (INDEPENDENT_AMBULATORY_CARE_PROVIDER_SITE_OTHER): Payer: Medicare Other | Admitting: Internal Medicine

## 2018-10-22 ENCOUNTER — Ambulatory Visit: Payer: Medicare Other | Admitting: Family

## 2018-10-22 VITALS — BP 164/92 | HR 59 | Temp 98.5°F | Resp 16 | Ht 61.52 in | Wt 142.8 lb

## 2018-10-22 DIAGNOSIS — R110 Nausea: Secondary | ICD-10-CM

## 2018-10-22 DIAGNOSIS — R42 Dizziness and giddiness: Secondary | ICD-10-CM

## 2018-10-22 MED ORDER — ONDANSETRON 4 MG PO TBDP: 4.0000 mg | ORAL_TABLET | Freq: Three times a day (TID) | ORAL | 0 refills | Status: DC | PRN

## 2018-10-22 MED ORDER — MECLIZINE HCL 12.5 MG PO TABS: 12.5000 mg | ORAL_TABLET | Freq: Three times a day (TID) | ORAL | 0 refills | Status: DC | PRN

## 2018-10-22 NOTE — Assessment & Plan Note (Signed)
Meclizine as needed Currently not having any dizziness Dizziness may be related to vertigo or dehydration Possibility of Mnire's disease Treat symptomatically for now Call if no improvement

## 2018-10-22 NOTE — Assessment & Plan Note (Signed)
?   Cause - GI related or Meniere's , less likely dicyclomine related improving - no dry heaves zofran as needed  Push fluids and bland food, small portions Call if no improvement

## 2018-10-22 NOTE — Patient Instructions (Signed)
Take the meclizine as needed for dizziness.   Take the zofran as needed for nausea.    Push the fluids and small, bland meals.   Continue increased rest.    Call if no improvement    Start walking regularly.  See if your pain is related to position or activity.

## 2018-11-05 ENCOUNTER — Other Ambulatory Visit: Payer: Self-pay | Admitting: Internal Medicine

## 2018-11-05 ENCOUNTER — Ambulatory Visit: Payer: Medicare Other | Admitting: Dietician

## 2018-11-09 ENCOUNTER — Other Ambulatory Visit: Payer: Self-pay | Admitting: Gastroenterology

## 2018-11-16 ENCOUNTER — Other Ambulatory Visit: Payer: Self-pay

## 2018-11-16 MED ORDER — DICYCLOMINE HCL 10 MG PO CAPS: 10.0000 mg | ORAL_CAPSULE | Freq: Three times a day (TID) | ORAL | 1 refills | Status: DC

## 2018-11-25 ENCOUNTER — Encounter: Payer: Medicare Other | Attending: Internal Medicine | Admitting: Dietician

## 2018-11-25 DIAGNOSIS — E11649 Type 2 diabetes mellitus with hypoglycemia without coma: Secondary | ICD-10-CM | POA: Diagnosis not present

## 2018-11-25 NOTE — Patient Instructions (Addendum)
   Medications reviewed and updated.  Changes include :   none  Your prescription(s) have been submitted to your pharmacy. Please take as directed and contact our office if you believe you are having problem(s) with the medication(s).    Please followup in 6 months   

## 2018-11-25 NOTE — Progress Notes (Signed)
Subjective:    Patient ID: Hannah Bryant, female    DOB: [birth year], 73 y.o.   MRN: [id number]  HPI The patient is here for follow up.  Diabetes: she sees dr Cruzita Lederer.  She is taking her medication daily as prescribed. She is compliant with a diabetic diet. She is exercising regularly - goes to gym, lots of walking.  She is up-to-date with an ophthalmology examination.   Hypertension: She is taking her medication daily. She is compliant with a low sodium diet.  She denies chest pain, edema, shortness of breath and regular headaches. She is exercising regularly.  She does not monitor her blood pressure at home.    Hyperlipidemia: She is taking her medication daily. She is compliant with a low fat/cholesterol diet. She is exercising regularly. She denies myalgias.   GERD:  She is taking her medication daily as prescribed.  She denies any GERD symptoms and feels her GERD is well controlled.    Medications and allergies reviewed with patient and updated if appropriate.  Patient Active Problem List   Diagnosis Date Noted  . Nausea 10/22/2018  . Dizziness 10/22/2018  . Vitamin D insufficiency 09/27/2018  . Right upper quadrant pain 09/15/2018  . Epigastric pain 09/15/2018  . Right rotator cuff tear 05/11/2017  . Cyst of joint of shoulder 03/04/2017  . Acute pain of right shoulder 02/06/2017  . Left sided abdominal pain 03/25/2016  . Type 2 diabetes mellitus with hypoglycemia without coma, without long-term current use of insulin ([place]) 02/28/2016  . Reactive hypoglycemia 02/28/2016  . Retrocalcaneal bursitis 02/26/2016  . Osteoporosis 01/09/2016  . Essential hypertension, benign 01/09/2016  . Achilles tendinosis 06/22/2015  . Junctional nevus of left thigh 12/23/2014  . Recurrent abdominal pain 11/28/2014  . Left rotator cuff tear arthropathy 08/21/2014  . Squamous cell skin cancer 10/06/2013  . BENIGN POSITIONAL VERTIGO 08/27/2009  . History of colonic polyps 05/09/2008  .  Gastroparesis 11/15/2007  . DIVERTICULOSIS, COLON 11/15/2007  . GASTROESOPHAGEAL REFLUX DISEASE, SEVERE 05/12/2007  . Nonspecific abnormal electrocardiogram (ECG) (EKG) 05/12/2007  . Hyperlipidemia 05/07/2007    Current Outpatient Medications on File Prior to Visit  Medication Sig Dispense Refill  . acetaminophen (TYLENOL ARTHRITIS PAIN) 650 MG CR tablet Take 650 mg by mouth. 2 by mouth 2 times daily     . aspirin 81 MG chewable tablet Chew 81 mg by mouth daily.    . Calcium-Vitamin D-Vitamin K (VIACTIV PO) Take by mouth. Reported on 02/28/2016    . Continuous Blood Gluc Sensor (FREESTYLE LIBRE 14 DAY SENSOR) MISC 1 each by Does not apply route every 14 (fourteen) days. Change every 2 weeks 2 each 11  . Cyanocobalamin (B-12) 1000 MCG TABS Take 1,000 mcg by mouth.    . DENTA 5000 PLUS 1.1 % CREA dental cream Take 1 Applicatorful by mouth See admin instructions.  0  . esomeprazole (NEXIUM) 40 MG capsule Take 1 capsule (40 mg total) by mouth daily. --- Needs office visit before further refills   30 capsule 2  . fexofenadine (ALLEGRA) 180 MG tablet Take 180 mg by mouth daily.      Marland Kitchen glucose blood (FREESTYLE TEST STRIPS) test strip Use as instructed 2x a day - for the Freestyle Libre CGM 100 each 12  . Lancets MISC by Does not apply route. Lifespan ultra 2 test strips and lancets     . meclizine (ANTIVERT) 12.5 MG tablet Take 1-2 tablets (12.5-25 mg total) by mouth 3 (three) times daily  as needed for dizziness. 60 tablet 0  . ondansetron (ZOFRAN ODT) 4 MG disintegrating tablet Take 1 tablet (4 mg total) by mouth every 8 (eight) hours as needed for nausea or vomiting. 30 tablet 0  . OVER THE COUNTER MEDICATION multistrain probiotic    . VITAMIN D, CHOLECALCIFEROL, PO Take by mouth.     No current facility-administered medications on file prior to visit.     Past Medical History:  Diagnosis Date  . Abnormal finding on EKG    non specific T changes; short PR  . Arthritis   . Bursitis     tronchanteric  . Complication of anesthesia    Severe PONV  . Diabetes mellitus without complication ([place])    Pt states she is pre-diabetic (12-06-13)  . Diverticulosis   . Gastroparesis    93 % retenton at 2 hrs  . GERD (gastroesophageal reflux disease)   . H/O dilation and curettage   . Hernia    hiatal  . Hiatal hernia   . Hyperlipidemia   . Hyperlipidemia   . hypoglycemia   . 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 CARDIAC CATHETERIZATION  2005   < 30 % lesion  . COLONOSCOPY W/ POLYPECTOMY  2006 ,2007 & 2015   negative 2009; Dr Fuller Plan  . DILATATION & CURETTAGE/HYSTEROSCOPY WITH TRUECLEAR N/A 01/11/2014   Procedure: DILATATION & CURETTAGE/HYSTEROSCOPY WITH TRUCLEAR;  Surgeon: Anastasio Auerbach, MD;  Location: [facility];  Service: Gynecology;  Laterality: N/A;  . ESOPHAGEAL MANOMETRY  03/22/15   Decreased peristalsis  . Esophagram  03/29/15   Persistent narrowing at site of hiatal hernia; proximal dilation  . G 3 P 3    . HIATAL HERNIA REPAIR  2000  . LEG SURGERY     Trauma LLE/pins and plates  . myomectomy  1999   uterine fibroid  . ROTATOR CUFF REPAIR Right 05/2017  . TRIGGER FINGER RELEASE Right   . TUBAL LIGATION    . Upper endoscopy and esophageal dilation  02/20/15   Dr Roney Mans , [facility]    Social History   Socioeconomic History  . Marital status: Married    Spouse name: Not on file  . Number of children: 2  . Years of education: Not on file  . Highest education level: Not on file  Occupational History    Employer: UNEMPLOYED  Social Needs  . Financial resource strain: Not on file  . Food insecurity:    Worry: Not on file    Inability: Not on file  . Transportation needs:    Medical: Not on file    Non-medical: Not on file  Tobacco Use  . Smoking status: Former Smoker    Types: Cigarettes    Last attempt to quit: 11/17/1980    Years since quitting: 38.0  . Smokeless tobacco:  Never Used  . Tobacco comment: smoked 1962-1982, up to 1 ppd  Substance and Sexual Activity  . Alcohol use: Yes    Alcohol/week: 2.0 standard drinks    Types: 2 Glasses of wine per week    Comment: socially,2 / week  . Drug use: No  . Sexual activity: Yes    Birth control/protection: Post-menopausal  Lifestyle  . Physical activity:    Days per week: Not on file    Minutes per session: Not on file  . Stress: Not on file  Relationships  . Social connections:  Talks on phone: Not on file    Gets together: Not on file    Attends religious service: Not on file    Active member of club or organization: Not on file    Attends meetings of clubs or organizations: Not on file    Relationship status: Not on file  Other Topics Concern  . Not on file  Social History Narrative   Daily caffeine use   Married   Veterinary surgeon intermittently    Family History  Problem Relation Age of Onset  . Ulcers Father   . Diabetes Father   . Hypertension Father   . Heart attack Father        > 90  . Stroke Father        in 79s  . Heart attack Mother 69  . Diabetes Sister   . Heart attack Sister 101       CABG X 4  . Cancer Neg Hx   . Colitis Neg Hx     Review of Systems  Constitutional: Negative for chills and fever.  Respiratory: Negative for cough, shortness of breath and wheezing.   Cardiovascular: Positive for palpitations (w/ hypoglycemia). Negative for chest pain and leg swelling.  Neurological: Negative for light-headedness and headaches.       Objective:   Vitals:   11/26/18 0852  BP: 128/80  Pulse: 65  Resp: 16  Temp: 98.7 F (37.1 C)  SpO2: 96%   BP Readings from Last 3 Encounters:  11/26/18 128/80  10/22/18 (!) 164/92  09/27/18 110/70   Wt Readings from Last 3 Encounters:  11/26/18 145 lb 1.9 oz (65.8 kg)  11/25/18 145 lb (65.8 kg)  10/22/18 142 lb 12.8 oz (64.8 kg)   Body mass index is 25.71 kg/m.   Physical Exam    Constitutional: Appears  well-developed and well-nourished. No distress.  HENT:  Head: Normocephalic and atraumatic.  Neck: Neck supple. No tracheal deviation present. No thyromegaly present.  No cervical lymphadenopathy Cardiovascular: Normal rate, regular rhythm and normal heart sounds.   No murmur heard. No carotid bruit .  No edema Pulmonary/Chest: Effort normal and breath sounds normal. No respiratory distress. No has no wheezes. No rales.  Skin: Skin is warm and dry. Not diaphoretic.  Psychiatric: Normal mood and affect. Behavior is normal.      Assessment & Plan:    See Problem List for Assessment and Plan of chronic medical problems.

## 2018-11-25 NOTE — Progress Notes (Signed)
Medical Nutrition Therapy:  Appt start time: 1000 end time:  1050.   Assessment:  Primary concerns today: Patient is here today alone for referral for reactive hypoglycemia.  I saw her last in 2016.  She had increased problems with dysphagia at that time as well as hypoglycemia.  She had lost 20 lbs without trying and was 165 lbs.  She has lost another 20 lbs in the past 3 years since that visit.  She states that she would like to continue to lose weight.   .  She eats oatmeal in the am, eats lunch at 12 or 1 and at 3-4 has hypoglycemia (40's-50's).  Reviewed her FreeStyle Elenor Legato and this happened about once in the past week.  She often over treats the low blood sugar and then has highs.  Her lunch is often very light (yesterday was about 150 calories, 22 grams of carbohydrates, 7 grams protein, and 3 grams fat.  When she reacted last, she had a birthday celebration and cake for breakfast.   She does not drive in the afternoon for fear of hypoglycemia. She is also very active and gets 8,000 to >10,000 steps daily.  History includes type 2 diabetes, gastroparesis, hypoglycemia, abdominal pain daily on Levsin. Her last A1C was 6.5%  Patient lives with her husband.  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    Preferred Learning Style:   No preference indicated    Learning Readiness:   Ready  Change in progress   MEDICATIONS: see list   DIETARY INTAKE:  Usual eating pattern includes 3 meals and 1 snacks per day. 24-hr recall:  B ( AM): regular cooked oatmeal, salt, 2 tsp butter, coffee with skim milk Snk ( AM): none  L ( PM): chicken noodle soup, crackers OR bean soup, crackers Snk ( PM): 1/2 protein bar OR yogurt OR edemame OR milk D ( PM): shrimp, pasta, vegetables OR caesar salad, 1/3 portion lasagne, bread Snk ( PM): none Beverages: coffee, skim milk, water, juice when blood sugar is low,   Usual physical activity: walks- aims for 10,000 steps daily  Estimated energy  needs: 1600 calories 180 g carbohydrates 100 g protein 53 g fat  Progress Towards Goal(s):  In progress.   Nutritional Diagnosis:  NB-1.1 Food and nutrition-related knowledge deficit As related to balance of protein, carbohydrate, and fat.  As evidenced by diet hx and patien report.    Intervention:  Nutrition counseling/education related to healthy nutrition.  Discussed that fat and fiber can be harder to digest (with gastroparesis) but fiber can be helpful for blood sugar stabilization.  Discussed need to have balanced meals with balanced amounts of unprocessed carbohydrate sources and a small amount of protein.  Encouraged weight maintenance and eating for good nutrition.  Discussed timing of meals, food choices, and portion sizes.  Gave her resources for foods eaten out.  Discouraged restrictive behavior.  Plan:  Aim for 2-3 Carb Choices per meal (30-45 grams) +/- 1 either way  Aim for 0-1 Carbs per snack each afternoon if hungry Include protein in moderation with your meals and snacks Consider reading food labels for Total Carbohydrate and Fat Grams of foods Avoid foods that have too much fat- fat can be very difficult to digest.  Treat low blood sugar with 15 grams of fast acting carbohydrates. Follow this with a snack or meal that contains a small portion of protein and carbohydrate Aim to avoid lows by always having a small snack with protein  and carbohydrates each afternoon.  Calorie [employer] app  Teaching Method Utilized:  Ship broker  Handouts given during visit include:  Gastroparesis nutrition therapy from [facility]  GERD nutrition therapy from [facility]  Hypoglycemia (unrelated to diabetes) nutrition therapy from [facility]  Hypoglycemia symptoms and treatment  Barriers to learning/adherence to lifestyle change: none  Demonstrated degree of understanding via:  Teach Back   Monitoring/Evaluation:  Dietary intake, exercise, label reading, and body weight prn.

## 2018-11-25 NOTE — Patient Instructions (Signed)
Plan:  Aim for 2-3 Carb Choices per meal (30-45 grams) +/- 1 either way  Aim for 0-1 Carbs per snack each afternoon if hungry Include protein in moderation with your meals and snacks Consider reading food labels for Total Carbohydrate and Fat Grams of foods Avoid foods that have too much fat- fat can be very difficult to digest.  Treat low blood sugar with 15 grams of fast acting carbohydrates. Follow this with a snack or meal that contains a small portion of protein and carbohydrate Aim to avoid lows by always having a small snack with protein and carbohydrates each afternoon.  Calorie [employer]

## 2018-11-26 ENCOUNTER — Ambulatory Visit (INDEPENDENT_AMBULATORY_CARE_PROVIDER_SITE_OTHER): Payer: Medicare Other | Admitting: Internal Medicine

## 2018-11-26 ENCOUNTER — Encounter: Payer: Self-pay | Admitting: Internal Medicine

## 2018-11-26 VITALS — BP 128/80 | HR 65 | Temp 98.7°F | Resp 16 | Ht 63.0 in | Wt 145.1 lb

## 2018-11-26 DIAGNOSIS — I10 Essential (primary) hypertension: Secondary | ICD-10-CM | POA: Diagnosis not present

## 2018-11-26 DIAGNOSIS — E7849 Other hyperlipidemia: Secondary | ICD-10-CM | POA: Diagnosis not present

## 2018-11-26 DIAGNOSIS — K2100 Gastro-esophageal reflux disease with esophagitis, without bleeding: Secondary | ICD-10-CM

## 2018-11-26 DIAGNOSIS — E11649 Type 2 diabetes mellitus with hypoglycemia without coma: Secondary | ICD-10-CM | POA: Diagnosis not present

## 2018-11-26 MED ORDER — HYDROCHLOROTHIAZIDE 12.5 MG PO CAPS: 12.5000 mg | ORAL_CAPSULE | Freq: Every day | ORAL | 3 refills | Status: DC

## 2018-11-26 MED ORDER — HYOSCYAMINE SULFATE 0.125 MG/ML PO SOLN: 0.1250 mg | ORAL | 0 refills | Status: DC | PRN

## 2018-11-26 MED ORDER — MONTELUKAST SODIUM 10 MG PO TABS: ORAL_TABLET | ORAL | 3 refills | Status: DC

## 2018-11-26 MED ORDER — ATORVASTATIN CALCIUM 40 MG PO TABS: ORAL_TABLET | ORAL | 3 refills | Status: DC

## 2018-11-26 NOTE — Assessment & Plan Note (Addendum)
Management per Dr Cruzita Lederer Continue regular exercise Diabetic diet - recently saw nutritionist and it was helpful Will get eye report

## 2018-11-26 NOTE — Assessment & Plan Note (Signed)
Lipids have been well controlled Continue statin Continue healthy diet and regular exercise

## 2018-11-26 NOTE — Assessment & Plan Note (Signed)
GERD controlled Continue daily medication  

## 2018-11-26 NOTE — Assessment & Plan Note (Signed)
BP well controlled Current regimen effective and well tolerated Continue current medications at current doses Last cmp reviewed

## 2018-12-01 ENCOUNTER — Encounter: Payer: Self-pay | Admitting: Internal Medicine

## 2018-12-01 ENCOUNTER — Other Ambulatory Visit: Payer: Self-pay | Admitting: Internal Medicine

## 2018-12-04 ENCOUNTER — Other Ambulatory Visit: Payer: Self-pay | Admitting: Internal Medicine

## 2018-12-07 ENCOUNTER — Encounter: Payer: Self-pay | Admitting: Internal Medicine

## 2019-01-12 DIAGNOSIS — D1801 Hemangioma of skin and subcutaneous tissue: Secondary | ICD-10-CM | POA: Diagnosis not present

## 2019-01-12 DIAGNOSIS — Z85828 Personal history of other malignant neoplasm of skin: Secondary | ICD-10-CM | POA: Diagnosis not present

## 2019-01-12 DIAGNOSIS — L821 Other seborrheic keratosis: Secondary | ICD-10-CM | POA: Diagnosis not present

## 2019-01-20 ENCOUNTER — Telehealth: Payer: Self-pay | Admitting: Internal Medicine

## 2019-01-20 MED ORDER — GLUCOSE BLOOD VI STRP: ORAL_STRIP | 12 refills | Status: DC

## 2019-01-20 NOTE — Telephone Encounter (Signed)
.  MEDICATION: Verio Test Strips glucose blood (FREESTYLE TEST STRIPS) test strip  PHARMACY:   [facility] - [place],  - [address]  IS THIS A 90 DAY SUPPLY :   IS PATIENT OUT OF MEDICATION: EXPIRED  IF NOT; HOW MUCH IS LEFT:   LAST APPOINTMENT DATE: @11 /09/2018  NEXT APPOINTMENT DATE:@5 /09/2019  DO WE HAVE YOUR PERMISSION TO LEAVE A DETAILED MESSAGE:  OTHER COMMENTS:  Patient uses the verio test strips for a back up meter.  **Let patient know to contact pharmacy at the end of the day to make sure medication is ready. **  ** Please notify patient to allow 48-72 hours to process**  **Encourage patient to contact the pharmacy for refills or they can request refills through [facility]**

## 2019-01-20 NOTE — Telephone Encounter (Signed)
RX sent

## 2019-01-27 DIAGNOSIS — H2513 Age-related nuclear cataract, bilateral: Secondary | ICD-10-CM | POA: Diagnosis not present

## 2019-01-27 DIAGNOSIS — H43811 Vitreous degeneration, right eye: Secondary | ICD-10-CM | POA: Diagnosis not present

## 2019-01-27 DIAGNOSIS — H524 Presbyopia: Secondary | ICD-10-CM | POA: Diagnosis not present

## 2019-01-30 ENCOUNTER — Encounter: Payer: Self-pay | Admitting: Internal Medicine

## 2019-01-31 MED ORDER — GLUCOSE BLOOD VI STRP: ORAL_STRIP | 12 refills | Status: DC

## 2019-02-11 ENCOUNTER — Other Ambulatory Visit: Payer: Self-pay | Admitting: Internal Medicine

## 2019-03-28 ENCOUNTER — Ambulatory Visit: Payer: Medicare Other | Admitting: Internal Medicine

## 2019-04-09 ENCOUNTER — Other Ambulatory Visit: Payer: Self-pay | Admitting: Internal Medicine

## 2019-04-15 ENCOUNTER — Encounter: Payer: Self-pay | Admitting: Internal Medicine

## 2019-04-15 ENCOUNTER — Other Ambulatory Visit: Payer: Self-pay

## 2019-04-15 ENCOUNTER — Ambulatory Visit (INDEPENDENT_AMBULATORY_CARE_PROVIDER_SITE_OTHER): Payer: Medicare Other | Admitting: Internal Medicine

## 2019-04-15 DIAGNOSIS — E559 Vitamin D deficiency, unspecified: Secondary | ICD-10-CM | POA: Diagnosis not present

## 2019-04-15 DIAGNOSIS — E161 Other hypoglycemia: Secondary | ICD-10-CM

## 2019-04-15 DIAGNOSIS — E11649 Type 2 diabetes mellitus with hypoglycemia without coma: Secondary | ICD-10-CM

## 2019-04-15 DIAGNOSIS — M810 Age-related osteoporosis without current pathological fracture: Secondary | ICD-10-CM | POA: Diagnosis not present

## 2019-04-15 NOTE — Progress Notes (Signed)
Patient ID: Hannah Bryant, female   DOB: May 27, 1946, 73 y.o.   MRN: [id number]  Patient location: Home My location: Office  Referring Provider: Binnie Rail, MD  I connected with the patient on 04/15/19 at  9:07 AM EDT by a video enabled telemedicine application and verified that I am speaking with the correct person.   I discussed the limitations of evaluation and management by telemedicine and the availability of in person appointments. The patient expressed understanding and agreed to proceed.   Details of the encounter are shown below.  HPI: Hannah Bryant is a 73 y.o.-year-old female, presenting for f/u for DM2, dx in ~2009 (or earlier), diet-controlled, with complications (reactive hypoglycemia) and osteoporosis.  Previously seen by Dr. Chalmers Cater. Last 06/2016.  Last visit with me 6 months ago.  Reactive hypoglycemia:  History: Pt described that has had hypoglycemia every pm for "ages". Sometimes: sweating, feeling hot, nausea, sometimes vomiting.  She may need to take a nap afterward.  She started to see Dr Roney Mans ([place]) >> hypoglycemic episodes = better. She had an EGD, Ba swallow test >> she had a stricture at the site of her previous hiatal hernia with food retention in the Esophagus long after she eats (procedure: Spring 2016). She had Esophageal dilation. She also had a capsule endoscopy.  Her hypoglycemia improved after esophageal stretching.  Lowest CBG were previously in the 50s but lately in the 60s.  She corrects these with OJ or honey sticks. Lunch is a sandwich/wrap + yoghurt at 1 pm and snack at 3 pm (cheese or PB+apples or PB crackers), supper at 7:30 pm.  She continues to try to accumulate 10,000 steps in a day per her Fitbit.  DM2:  Last hemoglobin A1c was: Lab Results  Component Value Date   HGBA1C 6.5 09/15/2018   HGBA1C 6.4 10/12/2017   HGBA1C 6.2 04/01/2017   She was previously on medication for her diabetes but she stopped as she was not  feeling well on it.  She would not want to restart.  She checks her sugars many times a day with her Libre CGM: - am: 117-129 - 2h after b'fast: 133 - lunch: ? - 2h after lunch: 90s-109 - dinner: ? - 2h after dinner: 60s-94 - bedtime: ?  Lowest sugar was: 45 (CGM) x1 >> 40s (CGM) >> 40s (CGM). She has hypoglycemia awareness in the 80s. Highest sugar was 261, 213 >> 200s >> 207 (overcorrection of lows).  Glucometer: 4: OneTouch Verio and Freestyle  She saw our nutritionist, Antonieta Iba. Last visit 02/2015  -No CKD, last BUN/creatinine:  Lab Results  Component Value Date   BUN 14 09/15/2018   CREATININE 0.72 09/15/2018   -+ HL; last set of lipids: Lab Results  Component Value Date   CHOL 150 09/15/2018   HDL 64.30 09/15/2018   LDLCALC 61 09/15/2018   TRIG 122.0 09/15/2018   CHOLHDL 2 09/15/2018  On Lipitor.  Reviewed her last DXA scan.  She has osteopenia, but also she has a history of fragility fractures, so this would qualify as osteoporosis: 02/08/2017 Lumbar spine (L1-L4) Femoral neck (FN)  T-score 0.6 RFN:-2.0 LFN:-1.6  Change in BMD from previous DXA test (%) Down 2.0% Down 4.3%   She has a history of a humeral fracture in 01/2017 (however, this was not seen on an x-ray...).  At last visits, I suggested Prolia and we discussed about benefits and possible side effects.  She preferred to wait until her dental implants  were complete to decide about this.  Also, she would like to get the new bone density scan first.  She has a history of vitamin D insufficiency: Lab Results  Component Value Date   VD25OH 33.78 09/27/2018   VD25OH 24.24 (L) 03/24/2018   VD25OH 28.68 (L) 03/04/2017   She continues on vitamin D 2000 units daily.  She visited [place] summer 2019.  ROS: Constitutional: + weight gain (5 lbs)/no weight loss, no fatigue, no subjective hyperthermia, no subjective hypothermia Eyes: no blurry vision, no xerophthalmia ENT: no sore throat, no nodules  palpated in neck, no dysphagia, no odynophagia, no hoarseness Cardiovascular: no CP/no SOB/no palpitations/no leg swelling Respiratory: no cough/no SOB/no wheezing Gastrointestinal: no N/no V/no D/no C/no acid reflux Musculoskeletal: no muscle aches/no joint aches Skin: no rashes, no hair loss Neurological: no tremors/no numbness/no tingling/no dizziness  I reviewed pt's medications, allergies, PMH, social hx, family hx, and changes were documented in the history of present illness. Otherwise, unchanged from my initial visit note.  History   Social History  . Marital Status: Married    Spouse Name: N/A  . Number of Children: 2   Social History Main Topics  . Smoking status: Former Smoker    Types: Cigarettes    Quit date: 11/17/1980  . Smokeless tobacco: Never Used     Comment: smoked 1962-1982, up to 1 ppd  . Alcohol Use: 1.2 oz/week    2 Glasses of wine per week     Comment: socially,2 / week  . Drug Use: No  . Sexual Activity: Yes    Birth Control/ Protection: Post-menopausal   Social History Narrative   Daily caffeine use   Married   Veterinary surgeon intermittently   Past Medical History:  Diagnosis Date  . Abnormal finding on EKG    non specific T changes; short PR  . Arthritis   . Bursitis    tronchanteric  . Complication of anesthesia    Severe PONV  . Diabetes mellitus without complication ([place])    Pt states she is pre-diabetic (12-06-13)  . Diverticulosis   . Gastroparesis    93 % [place] at 2 hrs  . GERD (gastroesophageal reflux disease)   . H/O dilation and curettage   . Hernia    hiatal  . Hiatal hernia   . Hyperlipidemia   . Hyperlipidemia   . hypoglycemia   . 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 CARDIAC CATHETERIZATION  2005   < 30 % lesion  . COLONOSCOPY W/ POLYPECTOMY  2006 ,2007 & 2015   negative 2009; Dr Fuller Plan  . DILATATION &  CURETTAGE/HYSTEROSCOPY WITH TRUECLEAR N/A 01/11/2014   Procedure: DILATATION & CURETTAGE/HYSTEROSCOPY WITH TRUCLEAR;  Surgeon: Anastasio Auerbach, MD;  Location: [facility];  Service: Gynecology;  Laterality: N/A;  . ESOPHAGEAL MANOMETRY  03/22/15   Decreased peristalsis  . Esophagram  03/29/15   Persistent narrowing at site of hiatal hernia; proximal dilation  . G 3 P 3    . HIATAL HERNIA REPAIR  2000  . LEG SURGERY     Trauma LLE/pins and plates  . myomectomy  1999   uterine fibroid  . ROTATOR CUFF REPAIR Right 05/2017  . TRIGGER FINGER RELEASE Right   . TUBAL LIGATION    . Upper endoscopy and esophageal dilation  02/20/15   Dr Roney Mans , [facility]   Current Outpatient Medications on File Prior to Visit  Medication Sig Dispense Refill  . acetaminophen (TYLENOL ARTHRITIS PAIN) 650 MG CR tablet Take 650 mg by mouth. 2 by mouth 2 times daily     . aspirin 81 MG chewable tablet Chew 81 mg by mouth daily.    Marland Kitchen atorvastatin (LIPITOR) 40 MG tablet TAKE 1 TABLET BY MOUTH DAILY 90 tablet 3  . Calcium-Vitamin D-Vitamin K (VIACTIV PO) Take by mouth. Reported on 02/28/2016    . Continuous Blood Gluc Sensor (FREESTYLE LIBRE 14 DAY SENSOR) MISC 1 EACH BY DOES NOT APPLY ROUTE EVERY 14 (FOURTEEN) DAYS. CHANGE EVERY 2 WEEKS 2 each 11  . Cyanocobalamin (B-12) 1000 MCG TABS Take 1,000 mcg by mouth.    . DENTA 5000 PLUS 1.1 % CREA dental cream Take 1 Applicatorful by mouth See admin instructions.  0  . esomeprazole (NEXIUM) 40 MG capsule Take 1 capsule (40 mg total) by mouth daily. --- Needs office visit before further refills   30 capsule 2  . fexofenadine (ALLEGRA) 180 MG tablet Take 180 mg by mouth daily.      Marland Kitchen glucose blood (FREESTYLE TEST STRIPS) test strip Use as instructed to check blood sugar 1 time a day as a back up for the Freestyle Libre CGM 100 each 12  . glucose blood (ONETOUCH VERIO) test strip Use as instructed to check blood sugar once a day. 100 each 12  . hydrochlorothiazide (MICROZIDE) 12.5 MG  capsule Take 1 capsule (12.5 mg total) by mouth daily. 90 capsule 3  . hyoscyamine (LEVSIN SL) 0.125 MG SL tablet DISSOLVE ONE OR TWO TABLETS UNDER TONGUE EVERY 4-6 HOURS AS NEEDED FOR ABDOMINAL PAIN AND CRAMPING 300 tablet 2  . hyoscyamine (LEVSIN) 0.125 MG/ML solution Take 1 mL (0.125 mg total) by mouth every 4 (four) hours as needed. 15 mL 0  . Lancets MISC by Does not apply route. Lifespan ultra 2 test strips and lancets     . meclizine (ANTIVERT) 12.5 MG tablet Take 1-2 tablets (12.5-25 mg total) by mouth 3 (three) times daily as needed for dizziness. 60 tablet 0  . montelukast (SINGULAIR) 10 MG tablet TAKE 1 TABLET BY MOUTH EVERY DAY AS NEEDED 90 tablet 3  . ondansetron (ZOFRAN ODT) 4 MG disintegrating tablet Take 1 tablet (4 mg total) by mouth every 8 (eight) hours as needed for nausea or vomiting. 30 tablet 0  . OVER THE COUNTER MEDICATION multistrain probiotic    . VITAMIN D, CHOLECALCIFEROL, PO Take by mouth.     No current facility-administered medications on file prior to visit.    Allergies  Allergen Reactions  . Domperidone     Stomach cramps  . Fentanyl Nausea And Vomiting  . Monistat [Miconazole Nitrate]     SEVERE BURNING  . Nsaids     REACTION: stomach complaints  . Oxycodone Hcl     REACTION: VOMITTING  . Codeine     nausea  . Erythromycin Ethylsuccinate     gastritis   Family History  Problem Relation Age of Onset  . Ulcers Father   . Diabetes Father   . Hypertension Father   . Heart attack Father        > 58  . Stroke Father        in 36s  . Heart attack Mother 57  . Diabetes Sister   . Heart attack Sister 55       CABG X 4  . Cancer Neg Hx   . Colitis Neg Hx    PE: LMP 11/18/2000  There is no height or weight on file to calculate BMI. Wt Readings from Last 3 Encounters:  11/26/18 145 lb 1.9 oz (65.8 kg)  11/25/18 145 lb (65.8 kg)  10/22/18 142 lb 12.8 oz (64.8 kg)   Constitutional:  in NAD  The physical exam was not performed (virtual  visit).  ASSESSMENT: 1. Diet-controlled DM2, without complications.  2. Hypoglycemia - possibly reactive - improved after her esophageal stretching - We ruled out adrenal insufficiency: Component     Latest Ref Rng 02/02/2015 02/02/2015 02/02/2015         9:21 AM 10:02 AM 10:02 AM  Cortisol, Plasma      7.1 29.1 24.2  C206 ACTH     6 - 50 pg/mL 12     3.  Osteopenia - + h/o Humeral fracture  4.  Vitamin D insufficiency  PLAN:  1. DM2, fairly well controlled -Diet controlled -No recent levels lower than 60 -She has occasional hyperglycemic spikes after correction of lows >> discussed to decrease the amount of fast Carbs -She continues to use a freestyle libre CGM.  We again discussed that in the low range, the CBGs checked by her meter may be 20 points higher than those shown by the CGM -Latest HbA1c was at goal, at 6.5% -No treatment needed for now, but will continue to pay attention to this diet and limit fatty foods and concentrated sweets -Return to clinic in 6 months   2. Patient with history of reactive (postprandial) hypoglycemia -Please see above -Possibly related to her gastroparesis - recent lowest sugars are 60s (we again discussed that her libre CGM usually shows lower readings with low normal values). -At last visit, we again discussed about limiting liquids with meals.  In the past, her low blood sugars episodes after dinner was possibly related to this being her largest meal and then drinking milk afterwards.    3. Osteopenia + fracture >> osteoporosis -Likely age-related + postmenopausal -She has osteopenia on her latest bone density scan and also history of humeral fracture.  Therefore, she has clinical osteoporosis.  I suggested Prolia at last visit, but she wanted to complete her dental work first.  We completed a PA for this. -At last visit we discussed about getting another bone density scan and then decide about Prolia afterwards.  We will order this during  the summer - she will contact me. -We again discussed about benefits and possible side effects of Prolia.    4.  Vitamin D insufficiency -Latest vitamin D level was reviewed from 09/2018: normal, at 33 -At that time, we continued her vitamin D 2000 units daily -We will recheck her vitamin D at next visit  Philemon Kingdom, MD PhD [facility] Endocrinology

## 2019-04-15 NOTE — Patient Instructions (Addendum)
Please send me a message during the summer about ordering your new bone density scan.  Please come back for a follow-up appointment in 6 months.

## 2019-05-04 ENCOUNTER — Other Ambulatory Visit: Payer: Self-pay | Admitting: Internal Medicine

## 2019-05-26 ENCOUNTER — Other Ambulatory Visit: Payer: Self-pay | Admitting: Internal Medicine

## 2019-05-26 ENCOUNTER — Encounter: Payer: Self-pay | Admitting: Internal Medicine

## 2019-05-29 NOTE — Progress Notes (Signed)
Virtual Visit via Video Note  I connected with Hannah Bryant on 05/30/19 at  9:00 AM EDT by a video enabled telemedicine application and verified that I am speaking with the correct person using two identifiers.   I discussed the limitations of evaluation and management by telemedicine and the availability of in person appointments. The patient expressed understanding and agreed to proceed.  The patient is currently at home and I am in the office.    No referring provider.    History of Present Illness: She is here for follow up of her chronic medical conditions.   She is not exercising regularly, but does a lot of yard work.  She was walking in the past and knows she needs to get back to her regular walking.  Diabetes: she follows with Dr Cruzita Lederer.  She is taking her medication daily as prescribed. She is compliant with a diabetic diet.  She monitors her sugars at home and they have been controlled.  She denies numbness/tingling in her feet and foot lesions.   Hypertension: She is taking her medication daily. She is compliant with a low sodium diet.  She denies chest pain, palpitations, edema, shortness of breath and regular headaches.  She does not monitor her blood pressure at home.  Hyperlipidemia: She is taking her medication daily. She is compliant with a low fat/cholesterol diet.    GERD:  She is taking her medication daily as prescribed.  She denies any GERD symptoms and feels her GERD is well controlled.    Review of Systems  Constitutional: Negative for chills and fever.  Respiratory: Negative for cough, shortness of breath and wheezing.   Cardiovascular: Negative for chest pain, palpitations and leg swelling.  Neurological: Negative for dizziness and headaches.      Social History   Socioeconomic History  . Marital status: Married    Spouse name: Not on file  . Number of children: 2  . Years of education: Not on file  . Highest education level: Not on file   Occupational History    Employer: UNEMPLOYED  Social Needs  . Financial resource strain: Not on file  . Food insecurity    Worry: Not on file    Inability: Not on file  . Transportation needs    Medical: Not on file    Non-medical: Not on file  Tobacco Use  . Smoking status: Former Smoker    Types: Cigarettes    Quit date: 11/17/1980    Years since quitting: 38.5  . Smokeless tobacco: Never Used  . Tobacco comment: smoked 1962-1982, up to 1 ppd  Substance and Sexual Activity  . Alcohol use: Yes    Alcohol/week: 2.0 standard drinks    Types: 2 Glasses of wine per week    Comment: socially,2 / week  . Drug use: No  . Sexual activity: Yes    Birth control/protection: Post-menopausal  Lifestyle  . Physical activity    Days per week: Not on file    Minutes per session: Not on file  . Stress: Not on file  Relationships  . Social Herbalist on phone: Not on file    Gets together: Not on file    Attends religious service: Not on file    Active member of club or organization: Not on file    Attends meetings of clubs or organizations: Not on file    Relationship status: Not on file  Other Topics Concern  . Not on file  Social History Narrative   Daily caffeine use   Married   Veterinary surgeon intermittently     Observations/Objective: Appears well in NAD   Assessment and Plan:  See Problem List for Assessment and Plan of chronic medical problems.   Follow Up Instructions:    I discussed the assessment and treatment plan with the patient. The patient was provided an opportunity to ask questions and all were answered. The patient agreed with the plan and demonstrated an understanding of the instructions.   The patient was advised to call back or seek an in-person evaluation if the symptoms worsen or if the condition fails to improve as anticipated.  She would like to hold off on blood work due to the coronavirus pandemic  FU in 6 months  Binnie Rail, MD

## 2019-05-30 ENCOUNTER — Ambulatory Visit (INDEPENDENT_AMBULATORY_CARE_PROVIDER_SITE_OTHER): Payer: Medicare Other | Admitting: Internal Medicine

## 2019-05-30 ENCOUNTER — Encounter: Payer: Self-pay | Admitting: Internal Medicine

## 2019-05-30 DIAGNOSIS — K2100 Gastro-esophageal reflux disease with esophagitis, without bleeding: Secondary | ICD-10-CM

## 2019-05-30 DIAGNOSIS — E7849 Other hyperlipidemia: Secondary | ICD-10-CM

## 2019-05-30 DIAGNOSIS — E11649 Type 2 diabetes mellitus with hypoglycemia without coma: Secondary | ICD-10-CM | POA: Diagnosis not present

## 2019-05-30 DIAGNOSIS — I10 Essential (primary) hypertension: Secondary | ICD-10-CM | POA: Diagnosis not present

## 2019-05-30 MED ORDER — HYDROCHLOROTHIAZIDE 12.5 MG PO CAPS: 12.5000 mg | ORAL_CAPSULE | Freq: Every day | ORAL | 1 refills | Status: DC

## 2019-05-30 MED ORDER — ATORVASTATIN CALCIUM 40 MG PO TABS: ORAL_TABLET | ORAL | 1 refills | Status: DC

## 2019-05-30 MED ORDER — MONTELUKAST SODIUM 10 MG PO TABS: ORAL_TABLET | ORAL | 1 refills | Status: DC

## 2019-05-30 NOTE — Assessment & Plan Note (Signed)
Management per Dr. 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

## 2019-05-30 NOTE — Assessment & Plan Note (Signed)
BP Readings from Last 3 Encounters:  11/26/18 128/80  10/22/18 (!) 164/92  09/27/18 110/70   Blood pressure is always been well controlled Continue current medications at current doses Will hold off on blood work due to coronavirus pandemic

## 2019-05-30 NOTE — Assessment & Plan Note (Signed)
GERD controlled Continue daily medication  

## 2019-05-30 NOTE — Assessment & Plan Note (Signed)
Continue atorvastatin

## 2019-06-14 ENCOUNTER — Other Ambulatory Visit: Payer: Self-pay | Admitting: Internal Medicine

## 2019-06-21 ENCOUNTER — Other Ambulatory Visit: Payer: Self-pay

## 2019-06-21 MED ORDER — HYOSCYAMINE SULFATE 0.125 MG SL SUBL: SUBLINGUAL_TABLET | SUBLINGUAL | 0 refills | Status: DC

## 2019-07-11 ENCOUNTER — Other Ambulatory Visit: Payer: Self-pay | Admitting: Internal Medicine

## 2019-07-26 DIAGNOSIS — Z23 Encounter for immunization: Secondary | ICD-10-CM | POA: Diagnosis not present

## 2019-08-04 ENCOUNTER — Other Ambulatory Visit: Payer: Self-pay | Admitting: Internal Medicine

## 2019-08-31 ENCOUNTER — Encounter: Payer: Self-pay | Admitting: Internal Medicine

## 2019-08-31 DIAGNOSIS — E11649 Type 2 diabetes mellitus with hypoglycemia without coma: Secondary | ICD-10-CM

## 2019-08-31 DIAGNOSIS — I10 Essential (primary) hypertension: Secondary | ICD-10-CM

## 2019-08-31 DIAGNOSIS — M810 Age-related osteoporosis without current pathological fracture: Secondary | ICD-10-CM

## 2019-08-31 DIAGNOSIS — E559 Vitamin D deficiency, unspecified: Secondary | ICD-10-CM

## 2019-08-31 DIAGNOSIS — E7849 Other hyperlipidemia: Secondary | ICD-10-CM

## 2019-09-09 ENCOUNTER — Other Ambulatory Visit (INDEPENDENT_AMBULATORY_CARE_PROVIDER_SITE_OTHER): Payer: Medicare Other

## 2019-09-09 ENCOUNTER — Encounter: Payer: Self-pay | Admitting: Internal Medicine

## 2019-09-09 DIAGNOSIS — I10 Essential (primary) hypertension: Secondary | ICD-10-CM

## 2019-09-09 DIAGNOSIS — E11649 Type 2 diabetes mellitus with hypoglycemia without coma: Secondary | ICD-10-CM

## 2019-09-09 DIAGNOSIS — M810 Age-related osteoporosis without current pathological fracture: Secondary | ICD-10-CM

## 2019-09-09 DIAGNOSIS — E559 Vitamin D deficiency, unspecified: Secondary | ICD-10-CM

## 2019-09-09 DIAGNOSIS — E7849 Other hyperlipidemia: Secondary | ICD-10-CM

## 2019-09-09 LAB — LIPID PANEL
Cholesterol: 128 mg/dL (ref 0–200)
HDL: 52.1 mg/dL (ref >39.00)
LDL Cholesterol: 54 mg/dL (ref 0–99)
NonHDL: 75.94
Total CHOL/HDL Ratio: 2
Triglycerides: 110 mg/dL (ref 0.0–149.0)
VLDL: 22 mg/dL (ref 0.0–40.0)

## 2019-09-09 LAB — CBC WITH DIFFERENTIAL/PLATELET
Basophils Absolute: 0 10*3/uL (ref 0.0–0.1)
Basophils Relative: 0.3 % (ref 0.0–3.0)
Eosinophils Absolute: 0.2 10*3/uL (ref 0.0–0.7)
Eosinophils Relative: 2.5 % (ref 0.0–5.0)
HCT: 39.1 % (ref 36.0–46.0)
Hemoglobin: 13.1 g/dL (ref 12.0–15.0)
Lymphocytes Relative: 32.4 % (ref 12.0–46.0)
Lymphs Abs: 2.5 10*3/uL (ref 0.7–4.0)
MCHC: 33.6 g/dL (ref 30.0–36.0)
MCV: 96.3 fl (ref 78.0–100.0)
Monocytes Absolute: 0.7 10*3/uL (ref 0.1–1.0)
Monocytes Relative: 8.4 % (ref 3.0–12.0)
Neutro Abs: 4.4 10*3/uL (ref 1.4–7.7)
Neutrophils Relative %: 56.4 % (ref 43.0–77.0)
Platelets: 164 10*3/uL (ref 150.0–400.0)
RBC: 4.06 Mil/uL (ref 3.87–5.11)
RDW: 13.4 % (ref 11.5–15.5)
WBC: 7.7 10*3/uL (ref 4.0–10.5)

## 2019-09-09 LAB — COMPREHENSIVE METABOLIC PANEL
ALT: 11 U/L (ref 0–35)
AST: 17 U/L (ref 0–37)
Albumin: 4.1 g/dL (ref 3.5–5.2)
Alkaline Phosphatase: 62 U/L (ref 39–117)
BUN: 15 mg/dL (ref 6–23)
CO2: 31 mEq/L (ref 19–32)
Calcium: 9.2 mg/dL (ref 8.4–10.5)
Chloride: 102 mEq/L (ref 96–112)
Creatinine, Ser: 0.72 mg/dL (ref 0.40–1.20)
GFR: 79.01 mL/min (ref >60.00)
Glucose, Bld: 120 mg/dL — ABNORMAL HIGH (ref 70–99)
Potassium: 3.4 mEq/L — ABNORMAL LOW (ref 3.5–5.1)
Sodium: 142 mEq/L (ref 135–145)
Total Bilirubin: 0.6 mg/dL (ref 0.2–1.2)
Total Protein: 6.6 g/dL (ref 6.0–8.3)

## 2019-09-09 LAB — MICROALBUMIN / CREATININE URINE RATIO
Creatinine,U: 91.5 mg/dL
Microalb Creat Ratio: 0.8 mg/g (ref 0.0–30.0)
Microalb, Ur: <0.7 mg/dL (ref 0.0–1.9)

## 2019-09-09 LAB — TSH: TSH: 1.12 u[IU]/mL (ref 0.35–4.50)

## 2019-09-09 LAB — HEMOGLOBIN A1C: Hgb A1c MFr Bld: 6.6 % — ABNORMAL HIGH (ref 4.6–6.5)

## 2019-09-09 LAB — VITAMIN D 25 HYDROXY (VIT D DEFICIENCY, FRACTURES): VITD: 97.5 ng/mL (ref 30.00–100.00)

## 2019-09-09 NOTE — Telephone Encounter (Signed)
Lab manager addressed issue with patient today.

## 2019-10-03 ENCOUNTER — Encounter: Payer: Self-pay | Admitting: Internal Medicine

## 2019-10-18 DIAGNOSIS — M7062 Trochanteric bursitis, left hip: Secondary | ICD-10-CM | POA: Diagnosis not present

## 2019-10-18 DIAGNOSIS — M25552 Pain in left hip: Secondary | ICD-10-CM | POA: Diagnosis not present

## 2019-10-18 DIAGNOSIS — M65331 Trigger finger, right middle finger: Secondary | ICD-10-CM | POA: Diagnosis not present

## 2019-11-17 ENCOUNTER — Encounter: Payer: Self-pay | Admitting: Internal Medicine

## 2019-11-22 NOTE — Progress Notes (Signed)
Virtual Visit via Video Note  I connected with Hannah Bryant on 11/23/19 at  9:45 AM EST by a video enabled telemedicine application and verified that I am speaking with the correct person using two identifiers.   I discussed the limitations of evaluation and management by telemedicine and the availability of in person appointments. The patient expressed understanding and agreed to proceed.  Present for the visit:  Myself, Dr Billey Gosling, [place].  The patient is currently at home and I am in the office.    No referring provider.    History of Present Illness: She is here for follow up of her chronic medical conditions.   She is not exercising regularly.    She has been waking up with leg cramps in her thighs sometimes.  She has taken yellow mustard and has had increased her greens.   Diabetes: she follows with Dr Cruzita Lederer.  She is taking her medication daily as prescribed. She is compliant with a diabetic diet.  Her sugars are well controlled. She denies numbness/tingling in her feet and foot lesions.   Hypertension: She is taking her medication daily. She is compliant with a low sodium diet.  She denies chest pain, palpitations, edema, shortness of breath and regular headaches. She does not monitor her blood pressure at home regularly - she will get a machine.    Hyperlipidemia: She is taking her medication daily. She is compliant with a low fat/cholesterol diet.  GERD:  She is taking her nexium daily.  She has frequent GERD symptoms-almost daily.     Review of Systems  Constitutional: Negative for fever.  Respiratory: Negative for cough, sputum production and wheezing.   Cardiovascular: Negative for chest pain, palpitations and leg swelling.  Gastrointestinal: Positive for heartburn.  Neurological: Positive for headaches. Negative for dizziness.      Social History   Socioeconomic History  . Marital status: Married    Spouse name: Not on file  . Number of  children: 2  . Years of education: Not on file  . Highest education level: Not on file  Occupational History    Employer: UNEMPLOYED  Tobacco Use  . Smoking status: Former Smoker    Types: Cigarettes    Quit date: 11/17/1980    Years since quitting: 39.0  . Smokeless tobacco: Never Used  . Tobacco comment: smoked 1962-1982, up to 1 ppd  Substance and Sexual Activity  . Alcohol use: Yes    Alcohol/week: 2.0 standard drinks    Types: 2 Glasses of wine per week    Comment: socially,2 / week  . Drug use: No  . Sexual activity: Yes    Birth control/protection: Post-menopausal  Other Topics Concern  . Not on file  Social History Narrative   Daily caffeine use   Married   Veterinary surgeon intermittently   Social Determinants of Health   Financial Resource Strain:   . Difficulty of Paying Living Expenses: Not on file  Food Insecurity:   . Worried About Charity fundraiser in the Last Year: Not on file  . Ran Out of Food in the Last Year: Not on file  Transportation Needs:   . Lack of Transportation (Medical): Not on file  . Lack of Transportation (Non-Medical): Not on file  Physical Activity:   . Days of Exercise per Week: Not on file  . Minutes of Exercise per Session: Not on file  Stress:   . Feeling of Stress : Not on file  Social Connections:   . Frequency of Communication with Friends and Family: Not on file  . Frequency of Social Gatherings with Friends and Family: Not on file  . Attends Religious Services: Not on file  . Active Member of Clubs or Organizations: Not on file  . Attends Archivist Meetings: Not on file  . Marital Status: Not on file     Observations/Objective: Appears well in NAD Breathing  Normally Skin appears warm and dry  Assessment and Plan:  See Problem List for Assessment and Plan of chronic medical problems.   Follow Up Instructions:    I discussed the assessment and treatment plan with the patient. The patient was  provided an opportunity to ask questions and all were answered. The patient agreed with the plan and demonstrated an understanding of the instructions.   The patient was advised to call back or seek an in-person evaluation if the symptoms worsen or if the condition fails to improve as anticipated.  FU in 6 months  Binnie Rail, MD

## 2019-11-23 ENCOUNTER — Ambulatory Visit (INDEPENDENT_AMBULATORY_CARE_PROVIDER_SITE_OTHER): Payer: Medicare Other | Admitting: Internal Medicine

## 2019-11-23 ENCOUNTER — Encounter: Payer: Self-pay | Admitting: Internal Medicine

## 2019-11-23 DIAGNOSIS — I10 Essential (primary) hypertension: Secondary | ICD-10-CM

## 2019-11-23 DIAGNOSIS — K219 Gastro-esophageal reflux disease without esophagitis: Secondary | ICD-10-CM | POA: Diagnosis not present

## 2019-11-23 DIAGNOSIS — E7849 Other hyperlipidemia: Secondary | ICD-10-CM | POA: Diagnosis not present

## 2019-11-23 DIAGNOSIS — E11649 Type 2 diabetes mellitus with hypoglycemia without coma: Secondary | ICD-10-CM

## 2019-11-23 MED ORDER — HYOSCYAMINE SULFATE 0.125 MG SL SUBL: SUBLINGUAL_TABLET | SUBLINGUAL | 3 refills | Status: DC

## 2019-11-23 MED ORDER — HYDROCHLOROTHIAZIDE 12.5 MG PO CAPS: 12.5000 mg | ORAL_CAPSULE | Freq: Every day | ORAL | 1 refills | Status: DC

## 2019-11-23 MED ORDER — ATORVASTATIN CALCIUM 40 MG PO TABS: ORAL_TABLET | ORAL | 1 refills | Status: DC

## 2019-11-23 MED ORDER — MONTELUKAST SODIUM 10 MG PO TABS: ORAL_TABLET | ORAL | 1 refills | Status: DC

## 2019-11-23 NOTE — Assessment & Plan Note (Signed)
Continue statin Encouraged regular exercise will hold off on blood work until after pandemic is improved

## 2019-11-23 NOTE — Assessment & Plan Note (Signed)
Taking Nexium daily Has GERD symptoms almost daily Advised to add Pepcid in the morning If not controlled after adding Pepcid may need to increase PPI to twice daily

## 2019-11-23 NOTE — Assessment & Plan Note (Signed)
Management per Dr. Cruzita Lederer Last A1c showed sugars are well controlled and she monitors them frequently at home and they have been good Encouraged regular activity

## 2019-11-23 NOTE — Assessment & Plan Note (Signed)
Chronic She does not check her BP at home, but will get a cuff and start checking it Continue current medications

## 2019-11-26 ENCOUNTER — Ambulatory Visit: Payer: Medicare Other | Attending: Internal Medicine

## 2019-11-26 DIAGNOSIS — Z23 Encounter for immunization: Secondary | ICD-10-CM | POA: Insufficient documentation

## 2019-12-07 DIAGNOSIS — H2513 Age-related nuclear cataract, bilateral: Secondary | ICD-10-CM | POA: Diagnosis not present

## 2019-12-07 DIAGNOSIS — H10503 Unspecified blepharoconjunctivitis, bilateral: Secondary | ICD-10-CM | POA: Diagnosis not present

## 2019-12-07 DIAGNOSIS — H04123 Dry eye syndrome of bilateral lacrimal glands: Secondary | ICD-10-CM | POA: Diagnosis not present

## 2019-12-14 ENCOUNTER — Ambulatory Visit: Payer: Medicare Other

## 2019-12-17 ENCOUNTER — Ambulatory Visit: Payer: Medicare Other | Attending: Internal Medicine

## 2019-12-17 DIAGNOSIS — Z23 Encounter for immunization: Secondary | ICD-10-CM

## 2019-12-17 NOTE — Progress Notes (Signed)
   Covid-19 Vaccination Clinic  Name:  Hannah Bryant    MRN: [id number] DOB: April 19, 1946  12/17/2019  Ms. Hannah Bryant was observed post Covid-19 immunization for 15 minutes without incidence. She was provided with Vaccine Information Sheet and instruction to access the V-Safe system.   Ms. Hannah Bryant was instructed to call 911 with any severe reactions post vaccine: Marland Kitchen Difficulty breathing  . Swelling of your face and throat  . A fast heartbeat  . A bad rash all over your body  . Dizziness and weakness    Immunizations Administered    Name Date Dose VIS Date Route   Pfizer COVID-19 Vaccine 12/17/2019  8:17 AM 0.3 mL 10/28/2019 Intramuscular   Manufacturer: [place]   Lot: [id number]   [place]: [id number]

## 2019-12-27 NOTE — Telephone Encounter (Signed)
Error

## 2020-01-06 ENCOUNTER — Ambulatory Visit (INDEPENDENT_AMBULATORY_CARE_PROVIDER_SITE_OTHER): Payer: Medicare Other | Admitting: Internal Medicine

## 2020-01-06 ENCOUNTER — Encounter: Payer: Self-pay | Admitting: Internal Medicine

## 2020-01-06 DIAGNOSIS — E11649 Type 2 diabetes mellitus with hypoglycemia without coma: Secondary | ICD-10-CM

## 2020-01-06 DIAGNOSIS — E559 Vitamin D deficiency, unspecified: Secondary | ICD-10-CM

## 2020-01-06 DIAGNOSIS — E161 Other hypoglycemia: Secondary | ICD-10-CM | POA: Diagnosis not present

## 2020-01-06 DIAGNOSIS — M810 Age-related osteoporosis without current pathological fracture: Secondary | ICD-10-CM | POA: Diagnosis not present

## 2020-01-06 NOTE — Patient Instructions (Signed)
Please restart vitamin D 500 units daily.  Come back for labs in 2 months.  We will need a new Bone Density Scan.  Please come back for a follow-up appointment in 6 months.

## 2020-01-06 NOTE — Progress Notes (Signed)
Patient ID: Hannah Bryant, female   DOB: July 05, 1946, 74 y.o.   MRN: [id number]  [place] location[place] Office Persons participating in the virtual visit: patient, provider  Referring Provider: Binnie Rail, MD  I connected with the patient on 01/06/20 at 10:06 AM EST by a video enabled telemedicine application and verified that I am speaking with the correct person.   I discussed the limitations of evaluation and management by telemedicine and the availability of in person appointments. The patient expressed understanding and agreed to proceed.   Details of the encounter are shown below.  HPI: TAEJA PATMON is a 74 y.o.-year-old female, presenting for f/u for DM2, dx in ~2009 (or earlier), diet-controlled, with complications (reactive hypoglycemia) and osteoporosis.  Previously seen by Dr. Chalmers Cater. Last 06/2016.  Last visit with me 8 months ago.  Reactive hypoglycemia:  Reviewed history: Pt described that has had hypoglycemia every pm for "ages". Sometimes: sweating, feeling hot, nausea, sometimes vomiting.  She may need to take a nap afterward.  She started to see Dr Roney Mans ([place]) >> hypoglycemic episodes = better. She had an EGD, Ba swallow test >> she had a stricture at the site of her previous hiatal hernia with food retention in the Esophagus long after she eats (procedure: Spring 2016). She had Esophageal dilation. She also had a capsule endoscopy.  Her hypoglycemia improved after esophageal stretching.  Lowest CBG were previously in the 50s but lately in the 60s.  She corrects these with OJ or honey sticks. Lunch is a sandwich/wrap + yoghurt at 1 pm and snack at 3 pm (cheese or PB+apples or PB crackers), supper at 7:30 pm.  DM2:  Latest HbA1c levels were reviewed: Lab Results  Component Value Date   HGBA1C 6.6 (H) 09/09/2019   HGBA1C 6.5 09/15/2018   HGBA1C 6.4 10/12/2017   She was previously on medication for her diabetes but she  stopped as she was not feeling well on them.  She would not want to restart.  She checks her sugars more than 4 times a day with her libre CGM: - am: 117-129 >> 120-125 - 2h after b'fast: 133 >> 120s - lunch: ? - 2h after lunch: 90s-109 >> 150-180, 200 (dessert) - pm: occasionally: Lo, 40-60s - dinner: ? - 2h after dinner: 60s-94 >> 90-120 - bedtime: ?  Lowest sugar was: 40s (CGM) >> LO (CGM). She has hypoglycemia awareness in the 80s. Highest sugar was 207 (overcorrection of lows) >> >200.  Glucometer: 4: OneTouch Verio and Freestyle  She saw our nutritionist, Antonieta Iba. Last visit 02/2015.  -No CKD, last BUN/creatinine:  Lab Results  Component Value Date   BUN 15 09/09/2019   CREATININE 0.72 09/09/2019   -+ HL; last set of lipids: Lab Results  Component Value Date   CHOL 128 09/09/2019   HDL 52.10 09/09/2019   LDLCALC 54 09/09/2019   TRIG 110.0 09/09/2019   CHOLHDL 2 09/09/2019  On Lipitor.  Reviewed her DXA scan report from 2018.  She has osteopenia but also has a history of fragility fractures so this would qualify as clinical osteoporosis: 02/08/2017 Lumbar spine (L1-L4) Femoral neck (FN)  T-score 0.6 RFN:-2.0 LFN:-1.6  Change in BMD from previous DXA test (%) Down 2.0% Down 4.3%   She has a history of a humeral fracture in 01/2017 (however, this was not seen on an x-ray...).  I suggested Prolia at previous visits and we discussed about benefits and possible side  effects.  She preferred to wait until her dental implants were complete to decide about this.  Also, she wanted to get a new bone density scan first.    She has a history of vitamin D insufficiency: Lab Results  Component Value Date   VD25OH 97.50 09/09/2019   VD25OH 33.78 09/27/2018   VD25OH 24.24 (L) 03/24/2018   VD25OH 28.68 (L) 03/04/2017   She was on 1000 units vitamin D daily >> stopped by PCP.  She visited [place] summer 2019.  ROS: Constitutional: no weight gain/no weight loss, no  fatigue, no subjective hyperthermia, no subjective hypothermia Eyes: no blurry vision, no xerophthalmia ENT: no sore throat, no nodules palpated in neck, no dysphagia, no odynophagia, no hoarseness Cardiovascular: no CP/no SOB/no palpitations/no leg swelling Respiratory: no cough/no SOB/no wheezing Gastrointestinal: no N/no V/no D/no C/no acid reflux Musculoskeletal: no muscle aches/no joint aches Skin: no rashes, no hair loss Neurological: no tremors/no numbness/no tingling/no dizziness  I reviewed pt's medications, allergies, PMH, social hx, family hx, and changes were documented in the history of present illness. Otherwise, unchanged from my initial visit note.  History   Social History  . Marital Status: Married    Spouse Name: N/A  . Number of Children: 2   Social History Main Topics  . Smoking status: Former Smoker    Types: Cigarettes    Quit date: 11/17/1980  . Smokeless tobacco: Never Used     Comment: smoked 1962-1982, up to 1 ppd  . Alcohol Use: 1.2 oz/week    2 Glasses of wine per week     Comment: socially,2 / week  . Drug Use: No  . Sexual Activity: Yes    Birth Control/ Protection: Post-menopausal   Social History Narrative   Daily caffeine use   Married   Veterinary surgeon intermittently   Past Medical History:  Diagnosis Date  . Abnormal finding on EKG    non specific T changes; short PR  . Arthritis   . Bursitis    tronchanteric  . Complication of anesthesia    Severe PONV  . Diabetes mellitus without complication ([place])    Pt states she is pre-diabetic (12-06-13)  . Diverticulosis   . Gastroparesis    93 % [place] at 2 hrs  . GERD (gastroesophageal reflux disease)   . H/O dilation and curettage   . Hernia    hiatal  . Hiatal hernia   . Hyperlipidemia   . Hyperlipidemia   . hypoglycemia   . 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 CARDIAC  CATHETERIZATION  2005   < 30 % lesion  . COLONOSCOPY W/ POLYPECTOMY  2006 ,2007 & 2015   negative 2009; Dr Fuller Plan  . DILATATION & CURETTAGE/HYSTEROSCOPY WITH TRUECLEAR N/A 01/11/2014   Procedure: DILATATION & CURETTAGE/HYSTEROSCOPY WITH TRUCLEAR;  Surgeon: Anastasio Auerbach, MD;  Location: [facility];  Service: Gynecology;  Laterality: N/A;  . ESOPHAGEAL MANOMETRY  03/22/15   Decreased peristalsis  . Esophagram  03/29/15   Persistent narrowing at site of hiatal hernia; proximal dilation  . G 3 P 3    . HIATAL HERNIA REPAIR  2000  . LEG SURGERY     Trauma LLE/pins and plates  . myomectomy  1999   uterine fibroid  . ROTATOR CUFF REPAIR Right 05/2017  . TRIGGER FINGER RELEASE Right   . TUBAL LIGATION    . Upper endoscopy and esophageal dilation  02/20/15   Dr Roney Mans , [facility]   Current Outpatient Medications on File Prior to Visit  Medication Sig Dispense Refill  . acetaminophen (TYLENOL ARTHRITIS PAIN) 650 MG CR tablet Take 650 mg by mouth. 2 by mouth 2 times daily     . aspirin 81 MG chewable tablet Chew 81 mg by mouth daily.    Marland Kitchen atorvastatin (LIPITOR) 40 MG tablet TAKE 1 TABLET BY MOUTH DAILY 90 tablet 1  . Calcium-Vitamin D-Vitamin K (VIACTIV PO) Take by mouth. Reported on 02/28/2016    . Continuous Blood Gluc Sensor (FREESTYLE LIBRE 14 DAY SENSOR) MISC 1 EACH BY DOES NOT APPLY ROUTE EVERY 14 (FOURTEEN) DAYS. CHANGE EVERY 2 WEEKS 2 each 11  . Cyanocobalamin (B-12) 1000 MCG TABS Take 1,000 mcg by mouth.    . esomeprazole (NEXIUM) 40 MG capsule Take 1 capsule (40 mg total) by mouth daily. --- Needs office visit before further refills   30 capsule 2  . fexofenadine (ALLEGRA) 180 MG tablet Take 180 mg by mouth daily.      Marland Kitchen glucose blood (FREESTYLE TEST STRIPS) test strip Use as instructed to check blood sugar 1 time a day as a back up for the Freestyle Libre CGM 100 each 12  . glucose blood (ONETOUCH VERIO) test strip Use as instructed to check blood sugar once a day. 100 each 12  .  hydrochlorothiazide (MICROZIDE) 12.5 MG capsule Take 1 capsule (12.5 mg total) by mouth daily. 90 capsule 1  . hyoscyamine (LEVSIN SL) 0.125 MG SL tablet DISSOLVE 1-2 TABLETS UNDER TONGUE EVERY 4-6 HOURS AS NEEDED FOR ABDOMINAL PAIN AND CRAMPING 300 tablet 3  . Lancets MISC by Does not apply route. Lifespan ultra 2 test strips and lancets     . montelukast (SINGULAIR) 10 MG tablet TAKE 1 TABLET BY MOUTH EVERY DAY AS NEEDED 90 tablet 1  . ondansetron (ZOFRAN ODT) 4 MG disintegrating tablet Take 1 tablet (4 mg total) by mouth every 8 (eight) hours as needed for nausea or vomiting. 30 tablet 0  . OVER THE COUNTER MEDICATION multistrain probiotic    . VITAMIN D, CHOLECALCIFEROL, PO Take by mouth.     No current facility-administered medications on file prior to visit.   Allergies  Allergen Reactions  . Domperidone     Stomach cramps  . Fentanyl Nausea And Vomiting  . Monistat [Miconazole Nitrate]     SEVERE BURNING  . Nsaids     REACTION: stomach complaints  . Oxycodone Hcl     REACTION: VOMITTING  . Codeine     nausea  . Erythromycin Ethylsuccinate     gastritis   Family History  Problem Relation Age of Onset  . Ulcers Father   . Diabetes Father   . Hypertension Father   . Heart attack Father        > 61  . Stroke Father        in 61s  . Heart attack Mother 35  . Diabetes Sister   . Heart attack Sister 61       CABG X 4  . Cancer Neg Hx   . Colitis Neg Hx    PE: LMP 11/18/2000  There is no height or weight on file to calculate BMI. Wt Readings from Last 3 Encounters:  11/26/18 145 lb 1.9 oz (65.8 kg)  11/25/18 145 lb (65.8 kg)  10/22/18 142 lb 12.8 oz (64.8 kg)   Constitutional:  in NAD  The physical exam was not performed (virtual visit).  ASSESSMENT: 1. Diet-controlled DM2, without complications.  2. Hypoglycemia - possibly reactive - improved after her esophageal stretching - We ruled out adrenal insufficiency: Component     Latest Ref Rng 02/02/2015  02/02/2015 02/02/2015         9:21 AM 10:02 AM 10:02 AM  Cortisol, Plasma      7.1 29.1 24.2  C206 ACTH     6 - 50 pg/mL 12     3.  Osteopenia - + h/o Humeral fracture  4.  Vitamin D insufficiency  PLAN:  1. DM2, fairly well controlled -Diet controlled -She continues to use the freestyle libre CGM.  We could not download this today due to the virtual nature of the appointment -Latest HbA1c was reviewed and this was stable, at 6.6% in 08/2019.  We will recheck this when she returns to the clinic -Return to clinic in 6 months  2.  History of reactive/postprandial hyperglycemia -Please see above -Possibly related to her gastroparesis -No significant lows recently.  She has had sugars in the 60s per her libre CGM, but we discussed that these could be 20 to 30 mg/dL lower than glucometer values -She is aware about not drinking liquids with meals.  In the past, her low blood sugars episodes after dinner were possibly related to this being her largest meal and then drinking milk afterwards -At this visit, sugars are higher after lunch as she is having sweets with this meal and then she has again postprandial hypoglycemia.  We discussed about limiting the concentrated sweets and fluids with lunch and also to check her blood sugars with a glucometer and not only with a CGM if she has low blood sugars on the CGM  3. Osteopenia + fracture >> osteoporosis -Likely age-related + postmenopausal -She has osteopenia on her bone density scan and also history of humerus fracture.  Therefore, she qualifies for clinical osteoporosis.  I suggested Prolia in the past and we completed a PA for this. -We discussed repeatedly in the past about benefits and possible side effects of Prolia. -She wanted to complete her dental work, and then she also wanted to obtain a new bone density scan before deciding for or against treatment -After we get the new bone density scan, we will discuss again about Prolia.  4.   Vitamin D insufficiency -Review latest vitamin D level from 08/2019, which was close to the upper limit of normal, and 97 -At that time, her vitamin D supplement was stopped -However, off vitamin D supplementation, her vitamin D level was low, so at this visit I advised her to restart 1000 units every other day -We will recheck her level in 2 months.  Orders Placed This Encounter  Procedures  . DG BONE DENSITY (DXA)  . Hemoglobin A1c   Philemon Kingdom, MD PhD [facility] Endocrinology

## 2020-01-16 DIAGNOSIS — Z1231 Encounter for screening mammogram for malignant neoplasm of breast: Secondary | ICD-10-CM | POA: Diagnosis not present

## 2020-01-18 ENCOUNTER — Telehealth: Payer: Self-pay

## 2020-01-18 DIAGNOSIS — H2513 Age-related nuclear cataract, bilateral: Secondary | ICD-10-CM | POA: Diagnosis not present

## 2020-01-18 DIAGNOSIS — H04123 Dry eye syndrome of bilateral lacrimal glands: Secondary | ICD-10-CM | POA: Diagnosis not present

## 2020-01-18 DIAGNOSIS — H524 Presbyopia: Secondary | ICD-10-CM | POA: Diagnosis not present

## 2020-01-18 DIAGNOSIS — H10503 Unspecified blepharoconjunctivitis, bilateral: Secondary | ICD-10-CM | POA: Diagnosis not present

## 2020-01-18 DIAGNOSIS — H43811 Vitreous degeneration, right eye: Secondary | ICD-10-CM | POA: Diagnosis not present

## 2020-01-18 NOTE — Telephone Encounter (Signed)
-----   Message from Eleanora Neighbor, [place] sent at 01/06/2020 10:25 AM EST ----- Regarding: dexa Needs dexa in March

## 2020-02-07 ENCOUNTER — Telehealth: Payer: Self-pay | Admitting: Internal Medicine

## 2020-02-07 DIAGNOSIS — I10 Essential (primary) hypertension: Secondary | ICD-10-CM

## 2020-02-07 NOTE — Progress Notes (Signed)
  Chronic Care Management   Note  02/07/2020 Name: Hannah Bryant MRN: [id number] DOB: 05/25/1946  Hannah Bryant is a 74 y.o. year old female who is a primary care patient of Burns, Claudina Lick, MD. I reached out to Victorino Dike by phone today in response to a referral sent by Hannah Bryant's PCP, Binnie Rail, MD.   Hannah Bryant was given information about Chronic Care Management services today including:  1. CCM service includes personalized support from designated clinical staff supervised by her physician, including individualized plan of care and coordination with other care providers 2. 24/7 contact phone numbers for assistance for urgent and routine care needs. 3. Service will only be billed when office clinical staff spend 20 minutes or more in a month to coordinate care. 4. Only one practitioner may furnish and bill the service in a calendar month. 5. The patient may stop CCM services at any time (effective at the end of the month) by phone call to the office staff.   Patient agreed to services and verbal consent obtained.   Follow up plan:  Hannah Bryant [employer] Scheduler

## 2020-02-07 NOTE — Progress Notes (Signed)
  Chronic Care Management   Outreach Note  02/07/2020 Name: Hannah Bryant MRN: [id number] DOB: 17-Aug-1946  Referred by: Binnie Rail, MD Reason for referral : No chief complaint on file.   An unsuccessful telephone outreach was attempted today. The patient was referred to the pharmacist for assistance with care management and care coordination.   Follow Up Plan:   Raynicia Dukes [employer] Scheduler

## 2020-02-27 NOTE — Addendum Note (Signed)
Addended by: Aviva Signs M on: 02/27/2020 10:05 AM   Modules accepted: Orders

## 2020-02-29 DIAGNOSIS — D485 Neoplasm of uncertain behavior of skin: Secondary | ICD-10-CM | POA: Diagnosis not present

## 2020-02-29 DIAGNOSIS — Z85828 Personal history of other malignant neoplasm of skin: Secondary | ICD-10-CM | POA: Diagnosis not present

## 2020-02-29 DIAGNOSIS — D1801 Hemangioma of skin and subcutaneous tissue: Secondary | ICD-10-CM | POA: Diagnosis not present

## 2020-02-29 DIAGNOSIS — D0472 Carcinoma in situ of skin of left lower limb, including hip: Secondary | ICD-10-CM | POA: Diagnosis not present

## 2020-02-29 DIAGNOSIS — L821 Other seborrheic keratosis: Secondary | ICD-10-CM | POA: Diagnosis not present

## 2020-03-02 ENCOUNTER — Other Ambulatory Visit: Payer: Self-pay

## 2020-03-02 ENCOUNTER — Ambulatory Visit: Payer: Medicare Other | Admitting: Pharmacist

## 2020-03-02 DIAGNOSIS — E11649 Type 2 diabetes mellitus with hypoglycemia without coma: Secondary | ICD-10-CM

## 2020-03-02 DIAGNOSIS — K2100 Gastro-esophageal reflux disease with esophagitis, without bleeding: Secondary | ICD-10-CM

## 2020-03-02 DIAGNOSIS — E7849 Other hyperlipidemia: Secondary | ICD-10-CM

## 2020-03-02 DIAGNOSIS — I10 Essential (primary) hypertension: Secondary | ICD-10-CM

## 2020-03-02 NOTE — Chronic Care Management (AMB) (Signed)
Chronic Care Management Pharmacy  Name: Hannah Bryant  MRN: [id number] DOB: [birth year]  Chief Complaint/ HPI  Hannah Bryant,  74 y.o. , female presents for their Initial CCM visit with the clinical pharmacist via telephone due to COVID-19 Pandemic.  PCP : Binnie Rail, MD  Their chronic conditions include: HTN, T2DM, HLD, GERD, osteoporosis  Office Visits: 11/23/19 Dr Quay Burow VV: GERD sx daily, taking Nexium daily, advised famotidine in the morning, may need to increase PPI to BID. Advised to get BP cuff to check at home.  Consult Visit: 01/06/20 Dr Renne Crigler VV: DM w/ reactive hypoglycemia, possibly related to gastroparesis. Sugars on 60s on CGM, rec'd to check with glucometer as well for low sugars. Also plans to discuss Prolia after next BMD. Rec'd to restart vitamin D 1000 QOD.   Medications: Outpatient Encounter Medications as of 03/02/2020  Medication Sig Note  . acetaminophen (TYLENOL ARTHRITIS PAIN) 650 MG CR tablet Take 650 mg by mouth. 2 by mouth 2 times daily    . aspirin 81 MG chewable tablet Chew 81 mg by mouth daily.   Marland Kitchen atorvastatin (LIPITOR) 40 MG tablet TAKE 1 TABLET BY MOUTH DAILY   . Continuous Blood Gluc Sensor (FREESTYLE LIBRE 14 DAY SENSOR) MISC 1 EACH BY DOES NOT APPLY ROUTE EVERY 14 (FOURTEEN) DAYS. CHANGE EVERY 2 WEEKS   . Cyanocobalamin (B-12) 1000 MCG TABS Take 1,000 mcg by mouth.   . esomeprazole (NEXIUM) 40 MG capsule Take 1 capsule (40 mg total) by mouth daily. --- Needs office visit before further refills     . fexofenadine (ALLEGRA) 180 MG tablet Take 180 mg by mouth daily.     Marland Kitchen glucose blood (FREESTYLE TEST STRIPS) test strip Use as instructed to check blood sugar 1 time a day as a back up for the Freestyle Libre CGM   . glucose blood (ONETOUCH VERIO) test strip Use as instructed to check blood sugar once a day. 04/15/2019: Back up for CGM  . hydrochlorothiazide (MICROZIDE) 12.5 MG capsule Take 1 capsule (12.5 mg total) by mouth daily.   .  hyoscyamine (LEVSIN SL) 0.125 MG SL tablet DISSOLVE 1-2 TABLETS UNDER TONGUE EVERY 4-6 HOURS AS NEEDED FOR ABDOMINAL PAIN AND CRAMPING   . Lactobacillus (PROBIOTIC ACIDOPHILUS PO) Take by mouth.   . Lancets MISC by Does not apply route. Lifespan ultra 2 test strips and lancets    . montelukast (SINGULAIR) 10 MG tablet TAKE 1 TABLET BY MOUTH EVERY DAY AS NEEDED   . ondansetron (ZOFRAN ODT) 4 MG disintegrating tablet Take 1 tablet (4 mg total) by mouth every 8 (eight) hours as needed for nausea or vomiting.   Marland Kitchen OVER THE COUNTER MEDICATION multistrain probiotic   . sodium chloride (OCEAN) 0.65 % SOLN nasal spray Place 1 spray into both nostrils as needed for congestion.   Marland Kitchen VITAMIN D, CHOLECALCIFEROL, PO Take by mouth.   . Calcium-Vitamin D-Vitamin K (VIACTIV PO) Take by mouth. Reported on 02/28/2016    No facility-administered encounter medications on file as of 03/02/2020.     Current Diagnosis/Assessment: SDOH Interventions     Most Recent Value  SDOH Interventions  SDOH Interventions for the Following Domains  Financial Strain  Financial Strain Interventions  Other (Comment) [no financial barriers]      Goals Addressed            This Visit's Progress   . Blood pressure: goal < 130/80       CARE PLAN ENTRY (see longitudinal plan  of care for additional care plan information)  Current Barriers:  . Uncontrolled hypertension, complicated by cholesterol and diabetes . Current antihypertensive regimen: hydrochlorothiazide 12.5 mg daily AM . Previous antihypertensives tried: n/a . Last practice recorded BP readings:  BP Readings from Last 3 Encounters:  11/26/18 128/80  10/22/18 (!) 164/92  09/27/18 110/70   . Current home BP readings: not checking . Most recent eGFR/CrCl:  . GFR, Est Non African American .  Date . Value . Ref Range . Status .  04/01/2017 . 85 . >=60 mL/min . Final   Pharmacist Clinical Goal(s):  Marland Kitchen Over the next 180 days, patient will work with PharmD and  providers to optimize antihypertensive regimen  Interventions: . Inter-disciplinary care team collaboration (see longitudinal plan of care) . Comprehensive medication review performed; medication list updated in the electronic medical record.  . Discussed blood pressure goals and benefits of checking at home  Patient Self Care Activities:  . Patient will start checking BP 2-3 times per week , document, and provide at future appointments . Patient will focus on medication adherence by pill box  Initial goal documentation     . Cholesterol: goal LDL < 70       CARE PLAN ENTRY (see longitudinal plan of care for additional care plan information)  Current Barriers:  . Uncontrolled hyperlipidemia, complicated by HTN, DM . Current antihyperlipidemic regimen: atorvastatin 40 mg daily . Previous antihyperlipidemic medications tried n/a . Most recent lipid panel:     Component Value Date/Time   CHOL 128 09/09/2019 0745   CHOL 161 12/01/2014 0741   TRIG 110.0 09/09/2019 0745   TRIG 93 12/01/2014 0741   HDL 52.10 09/09/2019 0745   HDL 73 12/01/2014 0741   CHOLHDL 2 09/09/2019 0745   VLDL 22.0 09/09/2019 0745   LDLCALC 54 09/09/2019 0745   LDLCALC 69 12/01/2014 0741    Pharmacist Clinical Goal(s):  Marland Kitchen Over the next 180 days, patient will work with PharmD and providers towards optimized antihyperlipidemic therapy  Interventions: . Comprehensive medication review performed; medication list updated in electronic medical record.  . Discussed cholesterol goals and importance of maintaining cholesterol in goal range to prevent heart attacks and strokes  Patient Self Care Activities:  . Patient will focus on medication adherence by pill box  Initial goal documentation     . Diabetes: hypoglycemia treatment       CARE PLAN ENTRY  Current Barriers:  . Chronic Disease Management support, education, and care coordination needs related to DMII  Pharmacist Clinical Goal(s):  Marland Kitchen Over the  next 180 days patient will work with pharmacist and providers to improve treatment for low blood sugar  Interventions: . Comprehensive medication review performed. . Discussed that due to dry mouth, traditional glucose tablets are not palatable for treatment on-the-go. Marland Kitchen Recommended glucose liquid or gummies as alternatives  Patient Self Care Activities:  . Patient verbalizes understanding of plan to try glucose gummies, Self administers medications as prescribed, Calls pharmacy for medication refills, and Calls provider office for new concerns or questions  Initial goal documentation    . GERD       CARE PLAN ENTRY  Current Barriers:  . Chronic Disease Management support, education, and care coordination needs related to GERD/heartburn  Pharmacist Clinical Goal(s):  Marland Kitchen Over the next 180 days, patient will work with pharmacist to optimize GERD management  Interventions: . Comprehensive medication review performed. . Discussed differences between baking soda and Tums/Rolaids o Recommended Tums or Rolaids for breakthrough  reflux symptoms  Patient Self Care Activities:  . Patient verbalizes understanding of plan to try Tums or Rolaids, Self administers medications as prescribed, Calls pharmacy for medication refills, and Calls provider office for new concerns or questions  Initial goal documentation       Hypertension   Office blood pressures are  BP Readings from Last 3 Encounters:  11/26/18 128/80  10/22/18 (!) 164/92  09/27/18 110/70    Patient has failed these meds in the past: n/a Patient is currently controlled on the following medications: HCTZ 12.5 mg daily AM  Patient checks BP at home infrequently; pt has cuff but has not sat down to learn how to use it yet  Patient home BP readings are ranging: n/a  We discussed diet and exercise extensively; BP goals and complications of elevated BP. Pt denies issues with HCTZ.  Plan  Continue current medications and  control with diet and exercise     Diabetes   Recent Relevant Labs: Lab Results  Component Value Date/Time   HGBA1C 6.6 (H) 09/09/2019 07:45 AM   HGBA1C 6.5 09/15/2018 12:00 PM   GFR 79.01 09/09/2019 07:45 AM   GFR 84.20 09/15/2018 12:00 PM   MICROALBUR <0.7 09/09/2019 07:53 AM   MICROALBUR 1.2 09/15/2018 01:44 PM    Checking BG: Daily - Freestyle Libre  Patient has tried these meds in past: metformin Patient is currently controlled on the following medications: no meds  Last diabetic Eye exam:  Lab Results  Component Value Date/Time   HMDIABEYEEXA No Retinopathy 06/21/2018 12:00 AM    Last diabetic Foot exam: No results found for: HMDIABFOOTEX   We discussed: Pt has more problems with low sugars than high. She follows with endocrine for this issue. She does have appetite issues, gastroparesis. Eats small meals more frequently. Has CGM to catch lows. Discussed methods to treat low sugar like glucose tablets, these are not palatable for her due to dry mouth, suggested liquid or gummies instead.  Plan  Continue current medications and control with diet and exercise  Recommended glucose gummies to treat lows   Hyperlipidemia   Lipid Panel     Component Value Date/Time   CHOL 128 09/09/2019 0745   CHOL 161 12/01/2014 0741   TRIG 110.0 09/09/2019 0745   TRIG 93 12/01/2014 0741   HDL 52.10 09/09/2019 0745   HDL 73 12/01/2014 0741   CHOLHDL 2 09/09/2019 0745   VLDL 22.0 09/09/2019 0745   LDLCALC 54 09/09/2019 0745   LDLCALC 69 12/01/2014 0741     The ASCVD Risk score (Goff DC Jr., et al., 2013) failed to calculate for the following reasons:   The systolic blood pressure is missing   The valid total cholesterol range is 130 to 320 mg/dL   Patient has failed these meds in past: n/a Patient is currently controlled on the following medications: atorvastatin 40 mg daily, aspirin 81 mg daily  We discussed:  Cholesterol goals, benefits of statin and  aspirin.  Plan  Continue current medications and control with diet and exercise   GERD/diverticulosis   Patient has failed these meds in past: dicyclomine Patient is currently controlled on the following medications: esomeprazole 40 mg daily, hyoscyamine 0.125 mg SL prn, ondansetron 4 mg q8h prn, probiotic  We discussed:  Pt takes Nexium before dinner. Adds Nexium in the AM sometimes. Has tried baking soda diluted in water, makes her nauseous. Does take Levsin q4-6 hrs, pain in left side, which dries her mouth out.   Plan  Continue current medications    Allergies   Patient has failed these meds in past: n/a Patient is currently controlled on the following medications: montelukast 10 mg daily, fexofenadine 180 mg daily  We discussed: Allergic to dust, medications help. Discussed possibly switching between antihistamines every so often.  Plan  Continue current medications   Health Maintenance   Patient is currently controlled on the following medications: Vitamin B12 1000 mcg,  Vitamin D, Tylenol 650 AM, Airborne  We discussed:  Patient is satisfied with current OTC regimen and denies issues   Plan  Continue current medications   Medication Management   Pt uses [facility] for all medications Uses pill box - 2ks at a time Pt endorses 100% compliance  We discussed: Pt is getting meds delivered from [facility], has a very good relationship with her pharmacist and denies issues obtaining medications.   Plan  Continue current medication management strategy      Follow up: 6 month phone visit  Charlene Brooke, PharmD Clinical Pharmacist [facility] [phone]

## 2020-03-02 NOTE — Patient Instructions (Signed)
Visit Information  Thank you for meeting with me to discuss your medications! I look forward to working with you to achieve your health care goals. Below is a summary of what we talked about during the visit:  Goals Addressed            This Visit's Progress   . Blood pressure: goal < 130/80       CARE PLAN ENTRY (see longitudinal plan of care for additional care plan information)  Current Barriers:  . Uncontrolled hypertension, complicated by cholesterol and diabetes . Current antihypertensive regimen: hydrochlorothiazide 12.5 mg daily AM . Previous antihypertensives tried: n/a . Last practice recorded BP readings:  BP Readings from Last 3 Encounters:  11/26/18 128/80  10/22/18 (!) 164/92  09/27/18 110/70   . Current home BP readings: not checking . Most recent eGFR/CrCl:  . GFR, Est Non African American .  Date . Value . Ref Range . Status .  04/01/2017 . 85 . >=60 mL/min . Final   Pharmacist Clinical Goal(s):  Marland Kitchen Over the next 180 days, patient will work with PharmD and providers to optimize antihypertensive regimen  Interventions: . Inter-disciplinary care team collaboration (see longitudinal plan of care) . Comprehensive medication review performed; medication list updated in the electronic medical record.  . Discussed blood pressure goals and benefits of checking at home  Patient Self Care Activities:  . Patient will start checking BP 2-3 times per week , document, and provide at future appointments . Patient will focus on medication adherence by pill box  Initial goal documentation     . Cholesterol: goal LDL < 70       CARE PLAN ENTRY (see longitudinal plan of care for additional care plan information)  Current Barriers:  . Uncontrolled hyperlipidemia, complicated by HTN, DM . Current antihyperlipidemic regimen: atorvastatin 40 mg daily . Previous antihyperlipidemic medications tried n/a . Most recent lipid panel:     Component Value Date/Time   CHOL 128  09/09/2019 0745   CHOL 161 12/01/2014 0741   TRIG 110.0 09/09/2019 0745   TRIG 93 12/01/2014 0741   HDL 52.10 09/09/2019 0745   HDL 73 12/01/2014 0741   CHOLHDL 2 09/09/2019 0745   VLDL 22.0 09/09/2019 0745   LDLCALC 54 09/09/2019 0745   LDLCALC 69 12/01/2014 0741    Pharmacist Clinical Goal(s):  Marland Kitchen Over the next 180 days, patient will work with PharmD and providers towards optimized antihyperlipidemic therapy  Interventions: . Comprehensive medication review performed; medication list updated in electronic medical record.  . Discussed cholesterol goals and importance of maintaining cholesterol in goal range to prevent heart attacks and strokes  Patient Self Care Activities:  . Patient will focus on medication adherence by pill box  Initial goal documentation     . Diabetes: hypoglycemia treatment       CARE PLAN ENTRY  Current Barriers:  . Chronic Disease Management support, education, and care coordination needs related to DMII  Pharmacist Clinical Goal(s):  Marland Kitchen Over the next 180 days patient will work with pharmacist and providers to improve treatment for low blood sugar  Interventions: . Comprehensive medication review performed. . Discussed that due to dry mouth, traditional glucose tablets are not palatable for treatment on-the-go. Marland Kitchen Recommended glucose liquid or gummies as alternatives  Patient Self Care Activities:  . Patient verbalizes understanding of plan to try glucose gummies, Self administers medications as prescribed, Calls pharmacy for medication refills, and Calls provider office for new concerns or questions  Initial goal  documentation    . GERD       CARE PLAN ENTRY  Current Barriers:  . Chronic Disease Management support, education, and care coordination needs related to GERD/heartburn  Pharmacist Clinical Goal(s):  Marland Kitchen Over the next 180 days, patient will work with pharmacist to optimize GERD management  Interventions: . Comprehensive medication  review performed. . Discussed differences between baking soda and Tums/Rolaids o Recommended Tums or Rolaids for breakthrough reflux symptoms  Patient Self Care Activities:  . Patient verbalizes understanding of plan to try Tums or Rolaids, Self administers medications as prescribed, Calls pharmacy for medication refills, and Calls provider office for new concerns or questions  Initial goal documentation       Hannah Bryant was given information about Chronic Care Management services today including:  1. CCM service includes personalized support from designated clinical staff supervised by her physician, including individualized plan of care and coordination with other care providers 2. 24/7 contact phone numbers for assistance for urgent and routine care needs. 3. Standard insurance, coinsurance, copays and deductibles apply for chronic care management only during months in which we provide at least 20 minutes of these services. Most insurances cover these services at 100%, however patients may be responsible for any copay, coinsurance and/or deductible if applicable. This service may help you avoid the need for more expensive face-to-face services. 4. Only one practitioner may furnish and bill the service in a calendar month. 5. The patient may stop CCM services at any time (effective at the end of the month) by phone call to the office staff.  Patient agreed to services and verbal consent obtained.   The patient verbalized understanding of instructions provided today and declined a print copy of patient instruction materials.  Telephone follow up appointment with pharmacy team member scheduled for: 6 months  Charlene Brooke, PharmD Clinical Pharmacist [facility] [phone]   Preventing Hypoglycemia Hypoglycemia occurs when the level of sugar (glucose) in the blood is too low. Hypoglycemia can happen in people who do or do not have diabetes (diabetes  mellitus). It can develop quickly, and it can be a medical emergency. For most people with diabetes, a blood glucose level below 70 mg/dL (3.9 mmol/L) is considered hypoglycemia. Glucose is a type of sugar that provides the body's main source of energy. Certain hormones (insulin and glucagon) control the level of glucose in the blood. Insulin lowers blood glucose, and glucagon increases blood glucose. Hypoglycemia can result from having too much insulin in the bloodstream, or from not eating enough food that contains glucose. Your risk for hypoglycemia is higher:  If you take insulin or diabetes medicines to help lower your blood glucose or help your body make more insulin.  If you skip or delay a meal or snack.  If you are ill.  During and after exercise. You can prevent hypoglycemia by working with your health care provider to adjust your meal plan as needed and by taking other precautions. How can hypoglycemia affect me? Mild symptoms Mild hypoglycemia may not cause any symptoms. If you do have symptoms, they may include:  Hunger.  Anxiety.  Sweating and feeling clammy.  Dizziness or feeling light-headed.  Sleepiness.  Nausea.  Increased heart rate.  Headache.  Blurry vision.  Irritability.  Tingling or numbness around the mouth, lips, or tongue.  A change in coordination.  Restless sleep. If mild hypoglycemia is not recognized and treated, it can quickly become moderate or severe hypoglycemia. Moderate symptoms Moderate  hypoglycemia can cause:  Mental confusion and poor judgment.  Behavior changes.  Weakness.  Irregular heartbeat. Severe symptoms Severe hypoglycemia is a medical emergency. It can cause:  Fainting.  Seizures.  Loss of consciousness (coma).  Death. What nutrition changes can be made?  Work with your health care provider or diet and nutrition specialist (dietitian) to make a healthy meal plan that is right for you. Follow your meal  plan carefully.  Eat meals at regular times.  If recommended by your health care provider, have snacks between meals.  Donot skip or delay meals or snacks. You can be at risk for hypoglycemia if you are not getting enough carbohydrates. What lifestyle changes can be made?   Work closely with your health care provider to manage your blood glucose. Make sure you know: ? Your goal blood glucose levels. ? How and when to check your blood glucose. ? The symptoms of hypoglycemia. It is important to treat it right away to keep it from becoming severe.  Do not drink alcohol on an empty stomach.  When you are ill, check your blood glucose more often than usual. Follow your sick day plan whenever you cannot eat or drink normally. Make this plan in advance with your health care provider.  Always check your blood glucose before, during, and after exercise. How is this treated? This condition can often be treated by immediately eating or drinking something that contains sugar, such as:  Fruit juice, 4-6 oz (120-150 mL).  Regular (not diet) soda, 4-6 oz (120-150 mL).  Low-fat milk, 4 oz (120 mL).  Several pieces of hard candy.  Sugar or honey, 1 Tbsp (15 mL). Treating hypoglycemia if you have diabetes If you are alert and able to swallow safely, follow the 15:15 rule:  Take 15 grams of a rapid-acting carbohydrate. Talk with your health care provider about how much you should take.  Rapid-acting options include: ? Glucose pills (take 15 grams). ? 6-8 pieces of hard candy. ? 4-6 oz (120-150 mL) of fruit juice. ? 4-6 oz (120-150 mL) of regular (not diet) soda.  Check your blood glucose 15 minutes after you take the carbohydrate.  If the repeat blood glucose level is still at or below 70 mg/dL (3.9 mmol/L), take 15 grams of a carbohydrate again.  If your blood glucose level does not increase above 70 mg/dL (3.9 mmol/L) after 3 tries, seek emergency medical care.  After your blood  glucose level returns to normal, eat a meal or a snack within 1 hour. Treating severe hypoglycemia Severe hypoglycemia is when your blood glucose level is at or below 54 mg/dL (3 mmol/L). Severe hypoglycemia is a medical emergency. Get medical help right away. If you have severe hypoglycemia and you cannot eat or drink, you may need an injection of glucagon. A family member or close friend should learn how to check your blood glucose and how to give you a glucagon injection. Ask your health care provider if you need to have an emergency glucagon injection kit available. Severe hypoglycemia may need to be treated in a hospital. The treatment may include getting glucose through an IV. You may also need treatment for the cause of your hypoglycemia. Where to find more information  American Diabetes Association: www.diabetes.[employer] of Diabetes and Digestive and Kidney Diseases: [employer] Contact a health care provider if:  You have problems keeping your blood glucose in your target range.  You have frequent episodes of hypoglycemia. Get help right away  if:  You continue to have hypoglycemia symptoms after eating or drinking something containing glucose.  Your blood glucose level is at or below 54 mg/dL (3 mmol/L).  You faint.  You have a seizure. These symptoms may represent a serious problem that is an emergency. Do not wait to see if the symptoms will go away. Get medical help right away. Call your local emergency services (911 in the [place] Summary  Know the symptoms of hypoglycemia, and when you are at risk for it (such as during exercise or when you are sick). Check your blood glucose often when you are at risk for hypoglycemia.  Hypoglycemia can develop quickly, and it can be dangerous if it is not treated right away. If you have a history of severe hypoglycemia, make sure you know how to use your glucagon injection kit.  Make sure you know how to treat  hypoglycemia. Keep a carbohydrate snack available when you may be at risk for hypoglycemia. This information is not intended to replace advice given to you by your health care provider. Make sure you discuss any questions you have with your health care provider. Document Revised: 02/25/2019 Document Reviewed: 07/01/2017 Elsevier Patient Education  [place].

## 2020-03-11 ENCOUNTER — Other Ambulatory Visit: Payer: Self-pay | Admitting: Internal Medicine

## 2020-03-27 ENCOUNTER — Other Ambulatory Visit: Payer: Self-pay | Admitting: Internal Medicine

## 2020-03-27 DIAGNOSIS — M65331 Trigger finger, right middle finger: Secondary | ICD-10-CM | POA: Diagnosis not present

## 2020-03-27 DIAGNOSIS — M7062 Trochanteric bursitis, left hip: Secondary | ICD-10-CM | POA: Diagnosis not present

## 2020-03-28 DIAGNOSIS — M6281 Muscle weakness (generalized): Secondary | ICD-10-CM | POA: Diagnosis not present

## 2020-03-28 DIAGNOSIS — M7062 Trochanteric bursitis, left hip: Secondary | ICD-10-CM | POA: Diagnosis not present

## 2020-04-09 DIAGNOSIS — M6281 Muscle weakness (generalized): Secondary | ICD-10-CM | POA: Diagnosis not present

## 2020-04-09 DIAGNOSIS — M7062 Trochanteric bursitis, left hip: Secondary | ICD-10-CM | POA: Diagnosis not present

## 2020-04-12 ENCOUNTER — Encounter: Payer: Self-pay | Admitting: Internal Medicine

## 2020-04-15 ENCOUNTER — Other Ambulatory Visit: Payer: Self-pay | Admitting: Internal Medicine

## 2020-06-27 ENCOUNTER — Encounter: Payer: Self-pay | Admitting: Internal Medicine

## 2020-07-03 ENCOUNTER — Ambulatory Visit: Payer: Medicare Other | Admitting: Internal Medicine

## 2020-07-07 ENCOUNTER — Other Ambulatory Visit: Payer: Self-pay | Admitting: Internal Medicine

## 2020-07-19 ENCOUNTER — Ambulatory Visit (INDEPENDENT_AMBULATORY_CARE_PROVIDER_SITE_OTHER): Payer: Medicare Other | Admitting: Internal Medicine

## 2020-07-19 ENCOUNTER — Other Ambulatory Visit: Payer: Self-pay

## 2020-07-19 ENCOUNTER — Encounter: Payer: Self-pay | Admitting: Internal Medicine

## 2020-07-19 VITALS — BP 130/70 | HR 62 | Ht 64.0 in | Wt 150.0 lb

## 2020-07-19 DIAGNOSIS — E161 Other hypoglycemia: Secondary | ICD-10-CM | POA: Diagnosis not present

## 2020-07-19 DIAGNOSIS — E11649 Type 2 diabetes mellitus with hypoglycemia without coma: Secondary | ICD-10-CM

## 2020-07-19 DIAGNOSIS — E559 Vitamin D deficiency, unspecified: Secondary | ICD-10-CM | POA: Diagnosis not present

## 2020-07-19 DIAGNOSIS — M810 Age-related osteoporosis without current pathological fracture: Secondary | ICD-10-CM | POA: Diagnosis not present

## 2020-07-19 LAB — POCT GLYCOSYLATED HEMOGLOBIN (HGB A1C): Hemoglobin A1C: 6.4 % — AB (ref 4.0–5.6)

## 2020-07-19 NOTE — Patient Instructions (Addendum)
Please restart vitamin D 500 units daily.  Please stop at the lab.  We will need a new Bone Density Scan - please call our [place] to schedule: [phone].  Try to have more healthy fats especially with lunch and in the pm. (nuts, humus, avocado, etc.).  Please come back for a follow-up appointment in 6 months.

## 2020-07-19 NOTE — Progress Notes (Signed)
Patient ID: LINSIE LUPO, female   DOB: [birth year], 74 y.o.   MRN: [id number]  This visit occurred during the SARS-CoV-2 public health emergency.  Safety protocols were in place, including screening questions prior to the visit, additional usage of staff PPE, and extensive cleaning of exam room while observing appropriate contact time as indicated for disinfecting solutions.   HPI: JATIA MUSA is a 74 y.o.-year-old female, presenting for f/u for DM2, dx in ~2009 (or earlier), diet-controlled, with complications (reactive hypoglycemia) and osteoporosis.  Previously seen by Dr. Chalmers Cater. Last 06/2016.  Last visit with me 6.5 months ago (virtual).  Since last visit, she developed trochanteric bursitis.  [place]  Reactive hypoglycemia:  Reviewed history: Pt described that has had hypoglycemia every pm for "ages". Sometimes: sweating, feeling hot, nausea, sometimes vomiting.  She may need to take a nap afterward.  She started to see Dr Roney Mans ([place]) >> hypoglycemic episodes = better. She had an EGD, Ba swallow test >> she had a stricture at the site of her previous hiatal hernia with food retention in the Esophagus long after she eats (procedure: Spring 2016). She had Esophageal dilation. She also had a capsule endoscopy.  Her hypoglycemia improved after esophageal stretching.  Lowest CBG were previously in the 50s but lately in the 60s.  She corrects these with OJ or honey sticks. Lunch is a sandwich/wrap + yoghurt at 1 pm and snack at 3 pm (cheese or PB+apples or PB crackers), supper at 7:30 pm.  DM2:  HbA1c levels reviewed:: Lab Results  Component Value Date   HGBA1C 6.6 (H) 09/09/2019   HGBA1C 6.5 09/15/2018   HGBA1C 6.4 10/12/2017   She has previously on diabetic medications but she stopped that she was not feeling well on them. She would not want to restart.  She checks her sugars more than four times a day with her  freestyle libre CGM.  At this visit, we were finally able to download her CGM:  Freestyle libre CGM parameters: - Average: 124 - % active CGM time: 93% of the time - Glucose variability 23.7% (target < or = to 36%) - time in range:  - very low (<54): 0% - low (54-69): 1% - normal range (70-180): 94% - high sugars (181-250): 5% - very high sugars (>250): 0%     Previously: - am: 117-129 >> 120-125 - 2h after b'fast: 133 >> 120s - lunch: ? - 2h after lunch: 90s-109 >> 150-180, 200 (dessert) - pm: occasionally: Lo, 40-60s - dinner: ? - 2h after dinner: 60s-94 >> 90-120 - bedtime: ?  Lowest sugar was: 40s (CGM) >> LO (CGM) >> 42. She has hypoglycemia awareness in the 80s. Highest sugar was 207 (overcorrection of lows) >> >200 >> 200s.  Glucometer: 4: OneTouch Verio and Freestyle  She saw our nutritionist, Antonieta Iba. Last visit 02/2015.  -No CKD, last BUN/creatinine:  Lab Results  Component Value Date   BUN 15 09/09/2019   CREATININE 0.72 09/09/2019   -+ HL; last set of lipids: Lab Results  Component Value Date   CHOL 128 09/09/2019   HDL 52.10 09/09/2019   LDLCALC 54 09/09/2019   TRIG 110.0 09/09/2019   CHOLHDL 2 09/09/2019  On Lipitor.  She has a history of a humerus fracture in 01/2017 (however, this was not seen on x-ray...).  Reviewed her previous DXA scan report from 2018. She has osteopenia on DXA but with her  history of humerus fracture, she has clinical osteoporosis: 02/08/2017 Lumbar spine (L1-L4) Femoral neck (FN)  T-score 0.6 RFN:-2.0 LFN:-1.6  Change in BMD from previous DXA test (%) Down 2.0% Down 4.3%   I suggested Prolia at previous visits and we discussed about benefits and possible side effects.  She preferred to wait until her dental implants were complete to decide about this.  Also, she wanted to get a new bone density scan first.  She did not do it since last visit as she is afraid to go have the test during the coronavirus pandemic.  She  has a history of vitamin D insufficiency: Lab Results  Component Value Date   VD25OH 97.50 09/09/2019   VD25OH 33.78 09/27/2018   VD25OH 24.24 (L) 03/24/2018   VD25OH 28.68 (L) 03/04/2017   She was on 1000 units vitamin D daily >> stopped by PCP >> restarted 500 units daily.  She visited [place] summer 2019.  ROS: Constitutional: no weight gain/no weight loss, no fatigue, no subjective hyperthermia, no subjective hypothermia Eyes: no blurry vision, no xerophthalmia ENT: no sore throat, no nodules palpated in neck, no dysphagia, no odynophagia, no hoarseness Cardiovascular: no CP/no SOB/no palpitations/no leg swelling Respiratory: no cough/no SOB/no wheezing Gastrointestinal: no N/no V/no D/no C/no acid reflux Musculoskeletal: no muscle aches/no joint aches Skin: no rashes, no hair loss Neurological: no tremors/no numbness/no tingling/no dizziness  I reviewed pt's medications, allergies, PMH, social hx, family hx, and changes were documented in the history of present illness. Otherwise, unchanged from my initial visit note.  History   Social History  . Marital Status: Married    Spouse Name: N/A  . Number of Children: 2   Social History Main Topics  . Smoking status: Former Smoker    Types: Cigarettes    Quit date: 11/17/1980  . Smokeless tobacco: Never Used     Comment: smoked 1962-1982, up to 1 ppd  . Alcohol Use: 1.2 oz/week    2 Glasses of wine per week     Comment: socially,2 / week  . Drug Use: No  . Sexual Activity: Yes    Birth Control/ Protection: Post-menopausal   Social History Narrative   Daily caffeine use   Married   Veterinary surgeon intermittently   Past Medical History:  Diagnosis Date  . Abnormal finding on EKG    non specific T changes; short PR  . Arthritis   . Bursitis    tronchanteric  . Complication of anesthesia    Severe PONV  . Diabetes mellitus without complication ([place])    Pt states she is pre-diabetic (12-06-13)  .  Diverticulosis   . Gastroparesis    93 % retenton at 2 hrs  . GERD (gastroesophageal reflux disease)   . H/O dilation and curettage   . Hernia    hiatal  . Hiatal hernia   . Hyperlipidemia   . Hyperlipidemia   . hypoglycemia   . 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 CARDIAC CATHETERIZATION  2005   < 30 % lesion  . COLONOSCOPY W/ POLYPECTOMY  2006 ,2007 & 2015   negative 2009; Dr Fuller Plan  . DILATATION & CURETTAGE/HYSTEROSCOPY WITH TRUECLEAR N/A 01/11/2014   Procedure: DILATATION & CURETTAGE/HYSTEROSCOPY WITH TRUCLEAR;  Surgeon: Anastasio Auerbach, MD;  Location: [facility];  Service: Gynecology;  Laterality: N/A;  . ESOPHAGEAL MANOMETRY  03/22/15   Decreased peristalsis  . Esophagram  03/29/15  Persistent narrowing at site of hiatal hernia; proximal dilation  . G 3 P 3    . HIATAL HERNIA REPAIR  2000  . LEG SURGERY     Trauma LLE/pins and plates  . myomectomy  1999   uterine fibroid  . ROTATOR CUFF REPAIR Right 05/2017  . TRIGGER FINGER RELEASE Right   . TUBAL LIGATION    . Upper endoscopy and esophageal dilation  02/20/15   Dr Roney Mans , [facility]   Current Outpatient Medications on File Prior to Visit  Medication Sig Dispense Refill  . acetaminophen (TYLENOL ARTHRITIS PAIN) 650 MG CR tablet Take 650 mg by mouth. 2 by mouth 2 times daily     . aspirin 81 MG chewable tablet Chew 81 mg by mouth daily.    Marland Kitchen atorvastatin (LIPITOR) 40 MG tablet TAKE 1 TABLET BY MOUTH EVERY DAY 90 tablet 1  . Calcium-Vitamin D-Vitamin K (VIACTIV PO) Take by mouth. Reported on 02/28/2016    . Continuous Blood Gluc Sensor (FREESTYLE LIBRE 14 DAY SENSOR) MISC APPLY 1 SENSOR EVERY 14 (FOURTEEN) DAYS. CHANGE EVERY 2 WEEKS 2 each 11  . Cyanocobalamin (B-12) 1000 MCG TABS Take 1,000 mcg by mouth.    . esomeprazole (NEXIUM) 40 MG capsule Take 1 capsule (40 mg total) by mouth daily. --- Needs office visit before further refills   30 capsule  2  . fexofenadine (ALLEGRA) 180 MG tablet Take 180 mg by mouth daily.      Marland Kitchen glucose blood (FREESTYLE TEST STRIPS) test strip Use as instructed to check blood sugar 1 time a day as a back up for the Freestyle Libre CGM 100 each 12  . glucose blood (ONETOUCH VERIO) test strip Use as instructed to check blood sugar once a day. 100 each 12  . hydrochlorothiazide (MICROZIDE) 12.5 MG capsule TAKE 1 CAPSULE BY MOUTH EVERY DAY 90 capsule 1  . hyoscyamine (LEVSIN SL) 0.125 MG SL tablet DISSOLVE 1-2 TABLETS UNDER TONGUE EVERY 4-6 HOURS AS NEEDED FOR ABDOMINAL PAIN AND CRAMPING 300 tablet 0  . Lactobacillus (PROBIOTIC ACIDOPHILUS PO) Take by mouth.    . Lancets MISC by Does not apply route. Lifespan ultra 2 test strips and lancets     . montelukast (SINGULAIR) 10 MG tablet TAKE 1 TABLET BY MOUTH EVERY DAY AS NEEDED 90 tablet 1  . ondansetron (ZOFRAN ODT) 4 MG disintegrating tablet Take 1 tablet (4 mg total) by mouth every 8 (eight) hours as needed for nausea or vomiting. 30 tablet 0  . OVER THE COUNTER MEDICATION multistrain probiotic    . sodium chloride (OCEAN) 0.65 % SOLN nasal spray Place 1 spray into both nostrils as needed for congestion.    Marland Kitchen VITAMIN D, CHOLECALCIFEROL, PO Take by mouth.     No current facility-administered medications on file prior to visit.   Allergies  Allergen Reactions  . Domperidone     Stomach cramps  . Fentanyl Nausea And Vomiting  . Monistat [Miconazole Nitrate]     SEVERE BURNING  . Nsaids     REACTION: stomach complaints  . Oxycodone Hcl     REACTION: VOMITTING  . Codeine     nausea  . Erythromycin Ethylsuccinate     gastritis   Family History  Problem Relation Age of Onset  . Ulcers Father   . Diabetes Father   . Hypertension Father   . Heart attack Father        > 62  . Stroke Father  in 15s  . Heart attack Mother 62  . Diabetes Sister   . Heart attack Sister 2       CABG X 4  . Cancer Neg Hx   . Colitis Neg Hx    PE: BP 130/70    Pulse 62   Ht 5\' 4"  (1.626 m)   Wt 150 lb (68 kg)   LMP 11/18/2000   SpO2 97%   BMI 25.75 kg/m  Body mass index is 25.75 kg/m. Wt Readings from Last 3 Encounters:  07/19/20 150 lb (68 kg)  11/26/18 145 lb 1.9 oz (65.8 kg)  11/25/18 145 lb (65.8 kg)   Constitutional: normal weight, in NAD Eyes: PERRLA, EOMI, no exophthalmos ENT: moist mucous membranes, no thyromegaly, no cervical lymphadenopathy Cardiovascular: RRR, No MRG Respiratory: CTA B Gastrointestinal: abdomen soft, NT, ND, BS+ Musculoskeletal: no deformities, strength intact in all 4 Skin: moist, warm, no rashes Neurological: no tremor with outstretched hands, DTR normal in all 4  ASSESSMENT: 1. Diet-controlled DM2, without complications.  2. Hypoglycemia - possibly reactive - improved after her esophageal stretching - We ruled out adrenal insufficiency: Component     Latest Ref Rng 02/02/2015 02/02/2015 02/02/2015         9:21 AM 10:02 AM 10:02 AM  Cortisol, Plasma      7.1 29.1 24.2  C206 ACTH     6 - 50 pg/mL 12     3.  Osteopenia - + h/o Humeral fracture  4.  Vitamin D insufficiency  PLAN:  1. DM2 -Diet controlled -She continues to use the freestyle libre CGM -Latest HbA1c was stable, at 6.6% in 08/2019 - at today's visit, HbA1c is 6.4% (lower) -Return to clinic in 6 months  2.  History of reactive/postprandial hyperglycemia -Please see above -Possibly related to her gastroparesis -No significant lows recently. She continues to have some sugars in the 60s per her libre CGM, but we discussed that this could be 20 to 30 mg/dL lower than the glucometer values. -She is aware about not drinking liquids with meals. In the past, her low blood sugars episodes after dinner were possibly related to this being her largest meal and then drinking milk afterwards -At last visit, sugars were higher after lunch as she was having sweets with this meal and then the sugars will drop to hypoglycemia range. We discussed  about limiting concentrated sweets and fluids with lunch and also check her blood sugars with a glucometer and not only with a CGM if the values are low on the CGM CGM interpretation: -At this visit, we reviewed together the CGM downloads.  It appears that 94% of the time her sugars are in range with only 1% low blood sugars between 54 and 69.  Reviewing the individual tracings, she had 1 day in which her sugars dropped lower: to 42 on the CGM.  These instances usually happen in the second half of the day and only rarely in the morning.  - At today's visit we again discussed about her diet and we discussed about how to prevent hyperglycemic spikes followed by rapid drop in blood sugars.  I advised her to include healthy fats and protein with lunch and her snack later in the day and given specific examples. - I also reviewed with her how she is correcting her lows and she is doing this accurately, with glucose tablets and glucose gel.  We will continue this.   3. Osteopenia + fracture >> osteoporosis -Likely age-related + postmenopausal -She  does qualify for the treatment since she has clinical osteoporosis (osteopenia on the bone density scan and also history of humeral fracture) -At last visit we discussed about obtaining another bone density scan but she did not have this yet -After the results are back, we will need to make a decision for or against treatment. I did suggest Prolia in the past and we repeatedly discussed about benefits and possible side effects of the medication. Of note, we will completed a PA for this in the past. We did not start it at previous visits as she wanted to complete dental work -At this visit, she tells me that she would like to wait to have the DXA only after the coronavirus pandemic is over.  I gave her the telephone number for our [place] office to call and schedule at her convenience.  4.  Vitamin D insufficiency -At last check, in 08/2019, vitamin D was close to the  upper limit of normal at 97.5 >> we stopped her vitamin D supplement at that time. -However, afterwards, we restarted 500 units daily at last visit -We will recheck the level now  Component     Latest Ref Rng & Units 07/19/2020  Vitamin D, 25-Hydroxy     30.0 - 100.0 ng/mL 31.7  Vitamin D level is normal.  We will continue the current dose of vitamin D supplement.  Philemon Kingdom, MD PhD [facility] Endocrinology

## 2020-07-20 ENCOUNTER — Encounter: Payer: Self-pay | Admitting: Internal Medicine

## 2020-07-20 LAB — VITAMIN D 25 HYDROXY (VIT D DEFICIENCY, FRACTURES): Vit D, 25-Hydroxy: 31.7 ng/mL (ref 30.0–100.0)

## 2020-07-24 NOTE — Patient Instructions (Addendum)
  Blood work was ordered.   ° ° °Medications reviewed and updated.  Changes include :   none ° ° ° °Please followup in 6 months ° ° °

## 2020-07-24 NOTE — Progress Notes (Signed)
Subjective:    Patient ID: Hannah Bryant, female    DOB: 28-Mar-1946, 74 y.o.   MRN: [id number]  HPI The patient is here for follow up of their chronic medical problems, including DM, Htn, hyperlipidemia, GERD, chronic Left sided abd pain   She sees Dr Cruzita Lederer for her DM. Sugars are well controlled.    She is not exercising regularly.   She knows she needs to lose weight.      Medications and allergies reviewed with patient and updated if appropriate.  Patient Active Problem List   Diagnosis Date Noted   Allergic rhinitis 07/25/2020   Nausea 10/22/2018   Dizziness 10/22/2018   Vitamin D insufficiency 09/27/2018   Epigastric pain 09/15/2018   Right rotator cuff tear 05/11/2017   Cyst of joint of shoulder 03/04/2017   Left sided abdominal pain 03/25/2016   Type 2 diabetes mellitus with hypoglycemia without coma, without long-term current use of insulin ([place]) 02/28/2016   Reactive hypoglycemia 02/28/2016   Retrocalcaneal bursitis 02/26/2016   Osteoporosis 01/09/2016   Essential hypertension, benign 01/09/2016   Achilles tendinosis 06/22/2015   Junctional nevus of left thigh 12/23/2014   Left rotator cuff tear arthropathy 08/21/2014   Squamous cell skin cancer 10/06/2013   BENIGN POSITIONAL VERTIGO 08/27/2009   History of colonic polyps 05/09/2008   Gastroparesis 11/15/2007   DIVERTICULOSIS, COLON 11/15/2007   GASTROESOPHAGEAL REFLUX DISEASE, SEVERE 05/12/2007   Nonspecific abnormal electrocardiogram (ECG) (EKG) 05/12/2007   Hyperlipidemia 05/07/2007    Current Outpatient Medications on File Prior to Visit  Medication Sig Dispense Refill   acetaminophen (TYLENOL ARTHRITIS PAIN) 650 MG CR tablet Take 650 mg by mouth. 2 by mouth 2 times daily      aspirin 81 MG chewable tablet Chew 81 mg by mouth daily.     atorvastatin (LIPITOR) 40 MG tablet TAKE 1 TABLET BY MOUTH EVERY DAY 90 tablet 1   Calcium-Vitamin D-Vitamin K (VIACTIV PO) Take by  mouth. Reported on 02/28/2016     Continuous Blood Gluc Sensor (FREESTYLE LIBRE 14 DAY SENSOR) MISC APPLY 1 SENSOR EVERY 14 (FOURTEEN) DAYS. CHANGE EVERY 2 WEEKS 2 each 11   Cyanocobalamin (B-12) 1000 MCG TABS Take 1,000 mcg by mouth.     esomeprazole (NEXIUM) 40 MG capsule Take 1 capsule (40 mg total) by mouth daily. --- Needs office visit before further refills   30 capsule 2   fexofenadine (ALLEGRA) 180 MG tablet Take 180 mg by mouth daily.       glucose blood (FREESTYLE TEST STRIPS) test strip Use as instructed to check blood sugar 1 time a day as a back up for the Freestyle Libre CGM 100 each 12   glucose blood (ONETOUCH VERIO) test strip Use as instructed to check blood sugar once a day. 100 each 12   hydrochlorothiazide (MICROZIDE) 12.5 MG capsule TAKE 1 CAPSULE BY MOUTH EVERY DAY 90 capsule 1   hyoscyamine (LEVSIN SL) 0.125 MG SL tablet DISSOLVE 1-2 TABLETS UNDER TONGUE EVERY 4-6 HOURS AS NEEDED FOR ABDOMINAL PAIN AND CRAMPING 300 tablet 0   Lactobacillus (PROBIOTIC ACIDOPHILUS PO) Take by mouth.     Lancets MISC by Does not apply route. Lifespan ultra 2 test strips and lancets      montelukast (SINGULAIR) 10 MG tablet TAKE 1 TABLET BY MOUTH EVERY DAY AS NEEDED 90 tablet 1   ondansetron (ZOFRAN ODT) 4 MG disintegrating tablet Take 1 tablet (4 mg total) by mouth every 8 (eight) hours as needed for nausea or  vomiting. 30 tablet 0   OVER THE COUNTER MEDICATION multistrain probiotic     sodium chloride (OCEAN) 0.65 % SOLN nasal spray Place 1 spray into both nostrils as needed for congestion.     VITAMIN D, CHOLECALCIFEROL, PO Take by mouth.     No current facility-administered medications on file prior to visit.    Past Medical History:  Diagnosis Date   Abnormal finding on EKG    non specific T changes; short PR   Arthritis    Bursitis    tronchanteric   Complication of anesthesia    Severe PONV   Diabetes mellitus without complication ([place])    Pt states she is  pre-diabetic (12-06-13)   Diverticulosis    Gastroparesis    93 % retenton at 2 hrs   GERD (gastroesophageal reflux disease)    H/O dilation and curettage    Hernia    hiatal   Hiatal hernia    Hyperlipidemia    Hyperlipidemia    hypoglycemia    Tubulovillous adenoma polyp of colon 06/2005   Hyperplastic 2007    Past Surgical History:  Procedure Laterality Date   CARDIAC CATHETERIZATION  2005   < 30 % lesion   COLONOSCOPY W/ POLYPECTOMY  2006 ,2007 & 2015   negative 2009; Dr Fuller Plan   DILATATION & CURETTAGE/HYSTEROSCOPY WITH TRUECLEAR N/A 01/11/2014   Procedure: DILATATION & CURETTAGE/HYSTEROSCOPY WITH TRUCLEAR;  Surgeon: Anastasio Auerbach, MD;  Location: [facility];  Service: Gynecology;  Laterality: N/A;   ESOPHAGEAL MANOMETRY  03/22/15   Decreased peristalsis   Esophagram  03/29/15   Persistent narrowing at site of hiatal hernia; proximal dilation   G 3 P 3     HIATAL HERNIA REPAIR  2000   LEG SURGERY     Trauma LLE/pins and plates   myomectomy  1999   uterine fibroid   ROTATOR CUFF REPAIR Right 05/2017   TRIGGER FINGER RELEASE Right    TUBAL LIGATION     Upper endoscopy and esophageal dilation  02/20/15   Dr Roney Mans , [facility]    Social History   Socioeconomic History   Marital status: Married    Spouse name: Not on file   Number of children: 2   Years of education: Not on file   Highest education level: Not on file  Occupational History    Employer: UNEMPLOYED  Tobacco Use   Smoking status: Former Smoker    Types: Cigarettes    Quit date: 11/17/1980    Years since quitting: 39.7   Smokeless tobacco: Never Used   Tobacco comment: smoked 1962-1982, up to 1 ppd  Substance and Sexual Activity   Alcohol use: Yes    Alcohol/week: 2.0 standard drinks    Types: 2 Glasses of wine per week    Comment: socially,2 / week   Drug use: No   Sexual activity: Yes    Birth control/protection: Post-menopausal  Other Topics Concern   Not on file   Social History Narrative   Daily caffeine use   Married   Veterinary surgeon intermittently   Social Determinants of Health   Financial Resource Strain: Low Risk    Difficulty of Paying Living Expenses: Not hard at all  Food Insecurity:    Worried About Charity fundraiser in the Last Year: Not on file   [employer] of Food in the Last Year: Not on file  Transportation Needs:    Lack of Transportation (Medical): Not on file   Lack  of Transportation (Non-Medical): Not on file  Physical Activity:    Days of Exercise per Week: Not on file   Minutes of Exercise per Session: Not on file  Stress:    Feeling of Stress : Not on file  Social Connections:    Frequency of Communication with Friends and Family: Not on file   Frequency of Social Gatherings with Friends and Family: Not on file   Attends Religious Services: Not on file   Active Member of Clubs or Organizations: Not on file   Attends Archivist Meetings: Not on file   Marital Status: Not on file    Family History  Problem Relation Age of Onset   Ulcers Father    Diabetes Father    Hypertension Father    Heart attack Father        > 29   Stroke Father        in 90s   Heart attack Mother 62   Diabetes Sister    Heart attack Sister 34       CABG X 4   Cancer Neg Hx    Colitis Neg Hx     Review of Systems  Constitutional: Negative for chills and fever.  Respiratory: Negative for cough, shortness of breath and wheezing.   Cardiovascular: Negative for chest pain, palpitations and leg swelling.  Neurological: Negative for dizziness, light-headedness and headaches.       Objective:   Vitals:   07/25/20 1333  BP: (!) 142/82  Pulse: 63  Temp: 98.1 F (36.7 C)  SpO2: 98%   BP Readings from Last 3 Encounters:  07/25/20 (!) 142/82  07/19/20 130/70  11/26/18 128/80   Wt Readings from Last 3 Encounters:  07/25/20 150 lb (68 kg)  07/19/20 150 lb (68 kg)  11/26/18 145 lb  1.9 oz (65.8 kg)   Body mass index is 25.75 kg/m.   Physical Exam    Constitutional: Appears well-developed and well-nourished. No distress.  HENT:  Head: Normocephalic and atraumatic.  Neck: Neck supple. No tracheal deviation present. No thyromegaly present.  No cervical lymphadenopathy Cardiovascular: Normal rate, regular rhythm and normal heart sounds.   No murmur heard. No carotid bruit .  No edema Pulmonary/Chest: Effort normal and breath sounds normal. No respiratory distress. No has no wheezes. No rales.  Skin: Skin is warm and dry. Not diaphoretic.  Psychiatric: Normal mood and affect. Behavior is normal.   Diabetic Foot Exam - Simple   Simple Foot Form Diabetic Foot exam was performed with the following findings: Yes 07/25/2020  1:54 PM  Visual Inspection No deformities, no ulcerations, no other skin breakdown bilaterally: Yes Sensation Testing Intact to touch and monofilament testing bilaterally: Yes Pulse Check Posterior Tibialis and Dorsalis pulse intact bilaterally: Yes Comments       Assessment & Plan:    See Problem List for Assessment and Plan of chronic medical problems.    This visit occurred during the SARS-CoV-2 public health emergency.  Safety protocols were in place, including screening questions prior to the visit, additional usage of staff PPE, and extensive cleaning of exam room while observing appropriate contact time as indicated for disinfecting solutions.

## 2020-07-25 ENCOUNTER — Other Ambulatory Visit: Payer: Self-pay

## 2020-07-25 ENCOUNTER — Ambulatory Visit (INDEPENDENT_AMBULATORY_CARE_PROVIDER_SITE_OTHER): Payer: Medicare Other | Admitting: Internal Medicine

## 2020-07-25 ENCOUNTER — Encounter: Payer: Self-pay | Admitting: Internal Medicine

## 2020-07-25 VITALS — BP 142/82 | HR 63 | Temp 98.1°F | Wt 150.0 lb

## 2020-07-25 DIAGNOSIS — E7849 Other hyperlipidemia: Secondary | ICD-10-CM

## 2020-07-25 DIAGNOSIS — K219 Gastro-esophageal reflux disease without esophagitis: Secondary | ICD-10-CM

## 2020-07-25 DIAGNOSIS — R109 Unspecified abdominal pain: Secondary | ICD-10-CM

## 2020-07-25 DIAGNOSIS — I10 Essential (primary) hypertension: Secondary | ICD-10-CM | POA: Diagnosis not present

## 2020-07-25 DIAGNOSIS — E11649 Type 2 diabetes mellitus with hypoglycemia without coma: Secondary | ICD-10-CM

## 2020-07-25 DIAGNOSIS — J309 Allergic rhinitis, unspecified: Secondary | ICD-10-CM | POA: Diagnosis not present

## 2020-07-25 NOTE — Assessment & Plan Note (Signed)
Chronic GERD controlled Continue daily medication nexium daily

## 2020-07-25 NOTE — Assessment & Plan Note (Signed)
BP Readings from Last 3 Encounters:  07/25/20 (!) 142/82  07/19/20 130/70  11/26/18 128/80   Chronic BP well controlled Current regimen effective and well tolerated Continue current medications at current doses cmp

## 2020-07-25 NOTE — Assessment & Plan Note (Signed)
Chronic Management per Dr Cruzita Lederer Well controlled

## 2020-07-25 NOTE — Assessment & Plan Note (Addendum)
Chronic Controlled Taking allegra and Singulair continue

## 2020-07-25 NOTE — Assessment & Plan Note (Signed)
Chronic Daily cramping in LLQ pain Take hyoscyamine 4/day Controlled continue

## 2020-07-25 NOTE — Assessment & Plan Note (Signed)
Chronic Check lipid panel  Continue daily statin Regular exercise and healthy diet encouraged  

## 2020-07-26 LAB — COMPLETE METABOLIC PANEL WITH GFR
AG Ratio: 1.9 (calc) (ref 1.0–2.5)
ALT: 12 U/L (ref 6–29)
AST: 18 U/L (ref 10–35)
Albumin: 4.6 g/dL (ref 3.6–5.1)
Alkaline phosphatase (APISO): 66 U/L (ref 37–153)
BUN: 14 mg/dL (ref 7–25)
CO2: 32 mmol/L (ref 20–32)
Calcium: 9.5 mg/dL (ref 8.6–10.4)
Chloride: 100 mmol/L (ref 98–110)
Creat: 0.65 mg/dL (ref 0.60–0.93)
GFR, Est African American: 101 mL/min/{1.73_m2} (ref >=60)
GFR, Est Non African American: 87 mL/min/{1.73_m2} (ref >=60)
Globulin: 2.4 g/dL (calc) (ref 1.9–3.7)
Glucose, Bld: 114 mg/dL — ABNORMAL HIGH (ref 65–99)
Potassium: 3.4 mmol/L — ABNORMAL LOW (ref 3.5–5.3)
Sodium: 142 mmol/L (ref 135–146)
Total Bilirubin: 0.5 mg/dL (ref 0.2–1.2)
Total Protein: 7 g/dL (ref 6.1–8.1)

## 2020-07-26 LAB — CBC WITH DIFFERENTIAL/PLATELET
Absolute Monocytes: 650 cells/uL (ref 200–950)
Basophils Absolute: 19 cells/uL (ref 0–200)
Basophils Relative: 0.2 %
Eosinophils Absolute: 68 cells/uL (ref 15–500)
Eosinophils Relative: 0.7 %
HCT: 42.3 % (ref 35.0–45.0)
Hemoglobin: 14.2 g/dL (ref 11.7–15.5)
Lymphs Abs: 2823 cells/uL (ref 850–3900)
MCH: 32.6 pg (ref 27.0–33.0)
MCHC: 33.6 g/dL (ref 32.0–36.0)
MCV: 97 fL (ref 80.0–100.0)
MPV: 11.4 fL (ref 7.5–12.5)
Monocytes Relative: 6.7 %
Neutro Abs: 6140 cells/uL (ref 1500–7800)
Neutrophils Relative %: 63.3 %
Platelets: 171 10*3/uL (ref 140–400)
RBC: 4.36 10*6/uL (ref 3.80–5.10)
RDW: 11.9 % (ref 11.0–15.0)
Total Lymphocyte: 29.1 %
WBC: 9.7 10*3/uL (ref 3.8–10.8)

## 2020-07-26 LAB — MICROALBUMIN / CREATININE URINE RATIO
Creatinine, Urine: 70 mg/dL (ref 20–275)
Microalb Creat Ratio: 24 mcg/mg creat (ref <30)
Microalb, Ur: 1.7 mg/dL

## 2020-07-26 LAB — LIPID PANEL
Cholesterol: 168 mg/dL (ref <200)
HDL: 67 mg/dL (ref >=50)
LDL Cholesterol (Calc): 78 mg/dL (calc)
Non-HDL Cholesterol (Calc): 101 mg/dL (calc) (ref <130)
Total CHOL/HDL Ratio: 2.5 (calc) (ref <5.0)
Triglycerides: 132 mg/dL (ref <150)

## 2020-08-04 DIAGNOSIS — Z23 Encounter for immunization: Secondary | ICD-10-CM | POA: Diagnosis not present

## 2020-08-17 DIAGNOSIS — Z23 Encounter for immunization: Secondary | ICD-10-CM | POA: Diagnosis not present

## 2020-08-27 ENCOUNTER — Telehealth: Payer: Medicare Other

## 2020-08-27 ENCOUNTER — Encounter: Payer: Self-pay | Admitting: Internal Medicine

## 2020-08-27 ENCOUNTER — Other Ambulatory Visit: Payer: Medicare Other

## 2020-08-27 DIAGNOSIS — Z20822 Contact with and (suspected) exposure to covid-19: Secondary | ICD-10-CM

## 2020-08-28 ENCOUNTER — Encounter: Payer: Self-pay | Admitting: Internal Medicine

## 2020-08-28 LAB — SARS-COV-2, NAA 2 DAY TAT

## 2020-08-28 LAB — NOVEL CORONAVIRUS, NAA: SARS-CoV-2, NAA: NOT DETECTED

## 2020-10-20 ENCOUNTER — Ambulatory Visit (INDEPENDENT_AMBULATORY_CARE_PROVIDER_SITE_OTHER): Payer: Medicare Other

## 2020-10-20 ENCOUNTER — Ambulatory Visit
Admission: RE | Admit: 2020-10-20 | Discharge: 2020-10-20 | Disposition: A | Discharge status: Home / Self Care | Payer: Medicare Other | Source: Ambulatory Visit | Attending: Urgent Care | Admitting: Urgent Care

## 2020-10-20 ENCOUNTER — Other Ambulatory Visit: Payer: Medicare Other

## 2020-10-20 ENCOUNTER — Other Ambulatory Visit: Payer: Self-pay

## 2020-10-20 VITALS — BP 153/86 | HR 82 | Temp 98.0°F | Resp 18

## 2020-10-20 DIAGNOSIS — Z1152 Encounter for screening for COVID-19: Secondary | ICD-10-CM

## 2020-10-20 DIAGNOSIS — R059 Cough, unspecified: Secondary | ICD-10-CM

## 2020-10-20 DIAGNOSIS — R0981 Nasal congestion: Secondary | ICD-10-CM

## 2020-10-20 DIAGNOSIS — E119 Type 2 diabetes mellitus without complications: Secondary | ICD-10-CM

## 2020-10-20 DIAGNOSIS — J3089 Other allergic rhinitis: Secondary | ICD-10-CM

## 2020-10-20 MED ORDER — BENZONATATE 100 MG PO CAPS
100.0000 mg | ORAL_CAPSULE | Freq: Three times a day (TID) | ORAL | 0 refills | Status: DC | PRN
Start: 2020-10-20 — End: 2020-11-07

## 2020-10-20 MED ORDER — PREDNISONE 20 MG PO TABS: ORAL_TABLET | ORAL | 0 refills | Status: DC

## 2020-10-20 NOTE — ED Triage Notes (Signed)
Patient states she has developed a cough and congestion since Wednesday. Pt advised she took a covid PCR at a [facility] [place] facility today and it is not back yet. Pt has had covid vaccines and booster. Pt is aox4 and ambulatory.

## 2020-10-20 NOTE — ED Provider Notes (Signed)
[place]   MRN: [id number] DOB: July 20, 1946  Subjective:   Hannah Bryant is a 74 y.o. female presenting for 4-day history of acute onset cough that is slightly improved.  She has now developed significant sinus congestion postnasal drainage.  Denies chest pain, shortness of breath.  Patient recently had is completing a course of amoxicillin for this.  She got Covid tested through our current facility earlier today.  Has a history of allergic rhinitis.  She is not using her Flonase but does use Singulair and Allegra.  Patient does have well-controlled diabetes.  No current facility-administered medications for this encounter.  Current Outpatient Medications:  .  acetaminophen (TYLENOL ARTHRITIS PAIN) 650 MG CR tablet, Take 650 mg by mouth. 2 by mouth 2 times daily , Disp: , Rfl:  .  aspirin 81 MG chewable tablet, Chew 81 mg by mouth daily., Disp: , Rfl:  .  atorvastatin (LIPITOR) 40 MG tablet, TAKE 1 TABLET BY MOUTH EVERY DAY, Disp: 90 tablet, Rfl: 1 .  Calcium-Vitamin D-Vitamin K (VIACTIV PO), Take by mouth. Reported on 02/28/2016, Disp: , Rfl:  .  Continuous Blood Gluc Sensor (FREESTYLE LIBRE 14 DAY SENSOR) MISC, APPLY 1 SENSOR EVERY 14 (FOURTEEN) DAYS. CHANGE EVERY 2 WEEKS, Disp: 2 each, Rfl: 11 .  Cyanocobalamin (B-12) 1000 MCG TABS, Take 1,000 mcg by mouth., Disp: , Rfl:  .  esomeprazole (NEXIUM) 40 MG capsule, Take 1 capsule (40 mg total) by mouth daily. --- Needs office visit before further refills  , Disp: 30 capsule, Rfl: 2 .  fexofenadine (ALLEGRA) 180 MG tablet, Take 180 mg by mouth daily.  , Disp: , Rfl:  .  glucose blood (FREESTYLE TEST STRIPS) test strip, Use as instructed to check blood sugar 1 time a day as a back up for the Freestyle Libre CGM, Disp: 100 each, Rfl: 12 .  glucose blood (ONETOUCH VERIO) test strip, Use as instructed to check blood sugar once a day., Disp: 100 each, Rfl: 12 .  hydrochlorothiazide (MICROZIDE) 12.5 MG capsule, TAKE 1 CAPSULE BY  MOUTH EVERY DAY, Disp: 90 capsule, Rfl: 1 .  hyoscyamine (LEVSIN SL) 0.125 MG SL tablet, DISSOLVE 1-2 TABLETS UNDER TONGUE EVERY 4-6 HOURS AS NEEDED FOR ABDOMINAL PAIN AND CRAMPING, Disp: 300 tablet, Rfl: 0 .  Lactobacillus (PROBIOTIC ACIDOPHILUS PO), Take by mouth., Disp: , Rfl:  .  Lancets MISC, by Does not apply route. Lifespan ultra 2 test strips and lancets , Disp: , Rfl:  .  montelukast (SINGULAIR) 10 MG tablet, TAKE 1 TABLET BY MOUTH EVERY DAY AS NEEDED, Disp: 90 tablet, Rfl: 1 .  ondansetron (ZOFRAN ODT) 4 MG disintegrating tablet, Take 1 tablet (4 mg total) by mouth every 8 (eight) hours as needed for nausea or vomiting., Disp: 30 tablet, Rfl: 0 .  OVER THE COUNTER MEDICATION, multistrain probiotic, Disp: , Rfl:  .  sodium chloride (OCEAN) 0.65 % SOLN nasal spray, Place 1 spray into both nostrils as needed for congestion., Disp: , Rfl:  .  VITAMIN D, CHOLECALCIFEROL, PO, Take by mouth., Disp: , Rfl:    Allergies  Allergen Reactions  . Domperidone     Stomach cramps  . Fentanyl Nausea And Vomiting  . Monistat [Miconazole Nitrate]     SEVERE BURNING  . Nsaids     REACTION: stomach complaints  . Oxycodone Hcl     REACTION: VOMITTING  . Codeine     nausea  . Erythromycin Ethylsuccinate     gastritis    Past Medical  History:  Diagnosis Date  . Abnormal finding on EKG    non specific T changes; short PR  . Arthritis   . Bursitis    tronchanteric  . Complication of anesthesia    Severe PONV  . Diabetes mellitus without complication ([place])    Pt states she is pre-diabetic (12-06-13)  . Diverticulosis   . Gastroparesis    93 % retenton at 2 hrs  . GERD (gastroesophageal reflux disease)   . H/O dilation and curettage   . Hernia    hiatal  . Hiatal hernia   . Hyperlipidemia   . Hyperlipidemia   . hypoglycemia   . Tubulovillous adenoma polyp of colon 06/2005   Hyperplastic 2007     Past Surgical History:  Procedure Laterality Date  . CARDIAC CATHETERIZATION  2005   <  30 % lesion  . COLONOSCOPY W/ POLYPECTOMY  2006 ,2007 & 2015   negative 2009; Dr Fuller Plan  . DILATATION & CURETTAGE/HYSTEROSCOPY WITH TRUECLEAR N/A 01/11/2014   Procedure: DILATATION & CURETTAGE/HYSTEROSCOPY WITH TRUCLEAR;  Surgeon: Anastasio Auerbach, MD;  Location: [facility];  Service: Gynecology;  Laterality: N/A;  . ESOPHAGEAL MANOMETRY  03/22/15   Decreased peristalsis  . Esophagram  03/29/15   Persistent narrowing at site of hiatal hernia; proximal dilation  . G 3 P 3    . HIATAL HERNIA REPAIR  2000  . LEG SURGERY     Trauma LLE/pins and plates  . myomectomy  1999   uterine fibroid  . ROTATOR CUFF REPAIR Right 05/2017  . TRIGGER FINGER RELEASE Right   . TUBAL LIGATION    . Upper endoscopy and esophageal dilation  02/20/15   Dr Roney Mans , [facility]    Family History  Problem Relation Age of Onset  . Ulcers Father   . Diabetes Father   . Hypertension Father   . Heart attack Father        > 32  . Stroke Father        in 69s  . Heart attack Mother 28  . Diabetes Sister   . Heart attack Sister 27       CABG X 4  . Cancer Neg Hx   . Colitis Neg Hx     Social History   Tobacco Use  . Smoking status: Former Smoker    Types: Cigarettes    Quit date: 11/17/1980    Years since quitting: 39.9  . Smokeless tobacco: Never Used  . Tobacco comment: smoked 1962-1982, up to 1 ppd  Substance Use Topics  . Alcohol use: Yes    Alcohol/week: 2.0 standard drinks    Types: 2 Glasses of wine per week    Comment: socially,2 / week  . Drug use: No    ROS   Objective:   Vitals: BP (!) 153/86 (BP Location: Left Arm)   Pulse 82   Temp 98 F (36.7 C) (Oral)   Resp 18   LMP 11/18/2000   SpO2 96%   Physical Exam Constitutional:      General: She is not in acute distress.    Appearance: Normal appearance. She is well-developed. She is not ill-appearing, toxic-appearing or diaphoretic.  HENT:     Head: Normocephalic and atraumatic.     Nose: Nose normal.     Mouth/Throat:     Mouth: Mucous  membranes are moist.  Eyes:     Extraocular Movements: Extraocular movements intact.     Pupils: Pupils are equal, round, and reactive to light.  Cardiovascular:     Rate and Rhythm: Normal rate and regular rhythm.     Pulses: Normal pulses.     Heart sounds: Normal heart sounds. No murmur heard.  No friction rub. No gallop.   Pulmonary:     Effort: Pulmonary effort is normal. No respiratory distress.     Breath sounds: Normal breath sounds. No stridor. No wheezing, rhonchi or rales.  Skin:    General: Skin is warm and dry.     Findings: No rash.  Neurological:     Mental Status: She is alert and oriented to person, place, and time.  Psychiatric:        Mood and Affect: Mood normal.        Behavior: Behavior normal.        Thought Content: Thought content normal.        Judgment: Judgment normal.     DG Chest 2 View  Result Date: 10/20/2020 CLINICAL DATA:  Cough. EXAM: CHEST - 2 VIEW COMPARISON:  None. FINDINGS: The heart size and mediastinal contours are within normal limits. Both lungs are clear. The visualized skeletal structures are unremarkable. IMPRESSION: No active cardiopulmonary disease. Electronically Signed   By: Dorise Bullion III M.D   On: 10/20/2020 14:50   Assessment and Plan :   PDMP not reviewed this encounter.  1. Allergic rhinitis due to other allergic trigger, unspecified seasonality   2. Sinus congestion   3. Cough   4. Well controlled diabetes mellitus ([place])     COVID-19 testing pending through different facility.  Will use prednisone course, Tessalon Perles and general supportive care especially in light of her hx of allergic rhinitis.  Maintain regular medications and finish course of amoxicillin.  Restart Flonase following the oral steroid course.  Follow-up with PCP. Counseled patient on potential for adverse effects with medications prescribed/recommended today, ER and return-to-clinic precautions discussed, patient verbalized understanding.      Jaynee Eagles, [place] 10/20/20 [phone]

## 2020-10-21 LAB — SARS-COV-2, NAA 2 DAY TAT

## 2020-10-21 LAB — NOVEL CORONAVIRUS, NAA: SARS-CoV-2, NAA: NOT DETECTED

## 2020-10-22 ENCOUNTER — Encounter: Payer: Self-pay | Admitting: Internal Medicine

## 2020-10-31 ENCOUNTER — Other Ambulatory Visit: Payer: Self-pay

## 2020-10-31 ENCOUNTER — Ambulatory Visit (INDEPENDENT_AMBULATORY_CARE_PROVIDER_SITE_OTHER): Payer: Medicare Other | Admitting: Internal Medicine

## 2020-10-31 ENCOUNTER — Encounter: Payer: Self-pay | Admitting: Internal Medicine

## 2020-10-31 DIAGNOSIS — J019 Acute sinusitis, unspecified: Secondary | ICD-10-CM | POA: Diagnosis not present

## 2020-10-31 DIAGNOSIS — J329 Chronic sinusitis, unspecified: Secondary | ICD-10-CM | POA: Insufficient documentation

## 2020-10-31 MED ORDER — AMOXICILLIN-POT CLAVULANATE 875-125 MG PO TABS: 1.0000 | ORAL_TABLET | Freq: Two times a day (BID) | ORAL | 0 refills | Status: AC

## 2020-10-31 MED ORDER — FREESTYLE TEST VI STRP
ORAL_STRIP | 12 refills | Status: DC
Start: 2020-10-31 — End: 2020-11-28

## 2020-10-31 NOTE — Progress Notes (Signed)
Subjective:    Patient ID: Hannah Bryant, female    DOB: 01/01/1946, 74 y.o.   MRN: [id number]  HPI The patient is here for an acute visit for cold symptoms.  Her symptoms started about 12/1.  She had a negative covid test.  She took amoxicillin that she was take for a dental implant (7or 10 days). She went to urgent care on 12/4 and was started on tessalon perles, prednisone and was advised to complete the amoxicillin. Her CXR was negative.     She still has a productive cough - it is improving. The sputum is thick and white-yellow.  She has a lot of congestion. She feels like most of the congestion is up in her upper chest/throat area. She does have drainage in the back of her throat. She denies any sinus pain.   She uses saline nasal spray, neti pot.   Medications and allergies reviewed with patient and updated if appropriate.  Patient Active Problem List   Diagnosis Date Noted  . Allergic rhinitis 07/25/2020  . Nausea 10/22/2018  . Dizziness 10/22/2018  . Vitamin D insufficiency 09/27/2018  . Epigastric pain 09/15/2018  . Right rotator cuff tear 05/11/2017  . Cyst of joint of shoulder 03/04/2017  . Left sided abdominal pain 03/25/2016  . Type 2 diabetes mellitus with hypoglycemia without coma, without long-term current use of insulin ([place]) 02/28/2016  . Reactive hypoglycemia 02/28/2016  . Retrocalcaneal bursitis 02/26/2016  . Osteoporosis 01/09/2016  . Essential hypertension, benign 01/09/2016  . Achilles tendinosis 06/22/2015  . Junctional nevus of left thigh 12/23/2014  . Left rotator cuff tear arthropathy 08/21/2014  . Squamous cell skin cancer 10/06/2013  . BENIGN POSITIONAL VERTIGO 08/27/2009  . History of colonic polyps 05/09/2008  . Gastroparesis 11/15/2007  . DIVERTICULOSIS, COLON 11/15/2007  . GASTROESOPHAGEAL REFLUX DISEASE, SEVERE 05/12/2007  . Nonspecific abnormal electrocardiogram (ECG) (EKG) 05/12/2007  . Hyperlipidemia 05/07/2007    Current  Outpatient Medications on File Prior to Visit  Medication Sig Dispense Refill  . acetaminophen (TYLENOL ARTHRITIS PAIN) 650 MG CR tablet Take 650 mg by mouth. 2 by mouth 2 times daily    . aspirin 81 MG chewable tablet Chew 81 mg by mouth daily.    Marland Kitchen atorvastatin (LIPITOR) 40 MG tablet TAKE 1 TABLET BY MOUTH EVERY DAY 90 tablet 1  . benzonatate (TESSALON) 100 MG capsule Take 1-2 capsules (100-200 mg total) by mouth 3 (three) times daily as needed. 60 capsule 0  . Calcium-Vitamin D-Vitamin K (VIACTIV PO) Take by mouth. Reported on 02/28/2016    . Continuous Blood Gluc Sensor (FREESTYLE LIBRE 14 DAY SENSOR) MISC APPLY 1 SENSOR EVERY 14 (FOURTEEN) DAYS. CHANGE EVERY 2 WEEKS 2 each 11  . Cyanocobalamin (B-12) 1000 MCG TABS Take 1,000 mcg by mouth.    . esomeprazole (NEXIUM) 40 MG capsule Take 1 capsule (40 mg total) by mouth daily. --- Needs office visit before further refills   30 capsule 2  . fexofenadine (ALLEGRA) 180 MG tablet Take 180 mg by mouth daily.    Marland Kitchen glucose blood (ONETOUCH VERIO) test strip Use as instructed to check blood sugar once a day. 100 each 12  . hydrochlorothiazide (MICROZIDE) 12.5 MG capsule TAKE 1 CAPSULE BY MOUTH EVERY DAY 90 capsule 1  . hyoscyamine (LEVSIN SL) 0.125 MG SL tablet DISSOLVE 1-2 TABLETS UNDER TONGUE EVERY 4-6 HOURS AS NEEDED FOR ABDOMINAL PAIN AND CRAMPING 300 tablet 0  . Lactobacillus (PROBIOTIC ACIDOPHILUS PO) Take by mouth.    Marland Kitchen  Lancets MISC by Does not apply route. Lifespan ultra 2 test strips and lancets    . montelukast (SINGULAIR) 10 MG tablet TAKE 1 TABLET BY MOUTH EVERY DAY AS NEEDED 90 tablet 1  . ondansetron (ZOFRAN ODT) 4 MG disintegrating tablet Take 1 tablet (4 mg total) by mouth every 8 (eight) hours as needed for nausea or vomiting. 30 tablet 0  . OVER THE COUNTER MEDICATION multistrain probiotic    . predniSONE (DELTASONE) 20 MG tablet Take 2 tablets daily with breakfast. 10 tablet 0  . sodium chloride (OCEAN) 0.65 % SOLN nasal spray Place 1  spray into both nostrils as needed for congestion.    Marland Kitchen VITAMIN D, CHOLECALCIFEROL, PO Take by mouth.     No current facility-administered medications on file prior to visit.    Past Medical History:  Diagnosis Date  . Abnormal finding on EKG    non specific T changes; short PR  . Arthritis   . Bursitis    tronchanteric  . Complication of anesthesia    Severe PONV  . Diabetes mellitus without complication ([place])    Pt states she is pre-diabetic (12-06-13)  . Diverticulosis   . Gastroparesis    93 % retenton at 2 hrs  . GERD (gastroesophageal reflux disease)   . H/O dilation and curettage   . Hernia    hiatal  . Hiatal hernia   . Hyperlipidemia   . Hyperlipidemia   . hypoglycemia   . 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 CARDIAC CATHETERIZATION  2005   < 30 % lesion  . COLONOSCOPY W/ POLYPECTOMY  2006 ,2007 & 2015   negative 2009; Dr Fuller Plan  . DILATATION & CURETTAGE/HYSTEROSCOPY WITH TRUECLEAR N/A 01/11/2014   Procedure: DILATATION & CURETTAGE/HYSTEROSCOPY WITH TRUCLEAR;  Surgeon: Anastasio Auerbach, MD;  Location: [facility];  Service: Gynecology;  Laterality: N/A;  . ESOPHAGEAL MANOMETRY  03/22/15   Decreased peristalsis  . Esophagram  03/29/15   Persistent narrowing at site of hiatal hernia; proximal dilation  . G 3 P 3    . HIATAL HERNIA REPAIR  2000  . LEG SURGERY     Trauma LLE/pins and plates  . myomectomy  1999   uterine fibroid  . ROTATOR CUFF REPAIR Right 05/2017  . TRIGGER FINGER RELEASE Right   . TUBAL LIGATION    . Upper endoscopy and esophageal dilation  02/20/15   Dr Roney Mans , [facility]    Social History   Socioeconomic History  . Marital status: Married    Spouse name: Not on file  . Number of children: 2  . Years of education: Not on file  . Highest education level: Not on file  Occupational History    Employer: UNEMPLOYED  Tobacco Use  . Smoking status: Former Smoker     Types: Cigarettes    Quit date: 11/17/1980    Years since quitting: 39.9  . Smokeless tobacco: Never Used  . Tobacco comment: smoked 1962-1982, up to 1 ppd  Vaping Use  . Vaping Use: Never used  Substance and Sexual Activity  . Alcohol use: Yes    Alcohol/week: 2.0 standard drinks    Types: 2 Glasses of wine per week    Comment: socially,2 / week  . Drug use: No  . Sexual activity: Yes    Birth control/protection: Post-menopausal  Other Topics Concern  . Not on file  Social History Narrative  Daily caffeine use   Married   Veterinary surgeon intermittently   Social Determinants of Health   Financial Resource Strain: Low Risk   . Difficulty of Paying Living Expenses: Not hard at all  Food Insecurity: Not on file  Transportation Needs: Not on file  Physical Activity: Not on file  Stress: Not on file  Social Connections: Not on file    Family History  Problem Relation Age of Onset  . Ulcers Father   . Diabetes Father   . Hypertension Father   . Heart attack Father        > 31  . Stroke Father        in 8s  . Heart attack Mother 75  . Diabetes Sister   . Heart attack Sister 55       CABG X 4  . Cancer Neg Hx   . Colitis Neg Hx     Review of Systems  Constitutional: Negative for chills and fever.  HENT: Positive for congestion and postnasal drip. Negative for ear pain, sinus pressure, sinus pain and sore throat.   Respiratory: Positive for cough. Negative for chest tightness, shortness of breath and wheezing.   Gastrointestinal: Negative for abdominal pain and nausea.  Neurological: Negative for light-headedness and headaches.       Objective:   Vitals:   10/31/20 0850  BP: 132/70  Pulse: 66  Temp: 98.6 F (37 C)  SpO2: 95%   BP Readings from Last 3 Encounters:  10/31/20 132/70  10/20/20 (!) 153/86  07/25/20 (!) 142/82   Wt Readings from Last 3 Encounters:  10/31/20 145 lb (65.8 kg)  07/25/20 150 lb (68 kg)  07/19/20 150 lb (68 kg)    Body mass index is 24.89 kg/m.   Physical Exam    GENERAL APPEARANCE: Appears stated age, well appearing, NAD EYES: conjunctiva clear, no icterus HEENT: bilateral tympanic membranes and ear canals normal, oropharynx with mild erythema, no thyromegaly, trachea midline, no cervical or supraclavicular lymphadenopathy LUNGS: Clear to auscultation without wheeze or crackles, unlabored breathing, good air entry bilaterally CARDIOVASCULAR: Normal S1,S2 without murmurs, no edema SKIN: Warm, dry      Assessment & Plan:    See Problem List for Assessment and Plan of chronic medical problems.    This visit occurred during the SARS-CoV-2 public health emergency.  Safety protocols were in place, including screening questions prior to the visit, additional usage of staff PPE, and extensive cleaning of exam room while observing appropriate contact time as indicated for disinfecting solutions.

## 2020-10-31 NOTE — Assessment & Plan Note (Addendum)
Subacute Likely bacterial - not completely resolved with amoxicillin - took 7-10 days-I think her infection is only partially treated Start Augmentin 875-125 mg BID x 7 day otc cold medications Continue saline nasal spray, neti pot Rest, fluid Call if no improvement

## 2020-10-31 NOTE — Patient Instructions (Addendum)
Take the antibiotic as prescribed - complete the entire course.    Continue saline nasal spray, neti pot, over the counter cold medications, advil and tylenol.      Call if no improvement       Sinusitis, Adult Sinusitis is inflammation of your sinuses. Sinuses are hollow spaces in the bones around your face. Your sinuses are located:  Around your eyes.  In the middle of your forehead.  Behind your nose.  In your cheekbones. Mucus normally drains out of your sinuses. When your nasal tissues become inflamed or swollen, mucus can become trapped or blocked. This allows bacteria, viruses, and fungi to grow, which leads to infection. Most infections of the sinuses are caused by a virus. Sinusitis can develop quickly. It can last for up to 4 weeks (acute) or for more than 12 weeks (chronic). Sinusitis often develops after a cold. What are the causes? This condition is caused by anything that creates swelling in the sinuses or stops mucus from draining. This includes:  Allergies.  Asthma.  Infection from bacteria or viruses.  Deformities or blockages in your nose or sinuses.  Abnormal growths in the nose (nasal polyps).  Pollutants, such as chemicals or irritants in the air.  Infection from fungi (rare). What increases the risk? You are more likely to develop this condition if you:  Have a weak body defense system (immune system).  Do a lot of swimming or diving.  Overuse nasal sprays.  Smoke. What are the signs or symptoms? The main symptoms of this condition are pain and a feeling of pressure around the affected sinuses. Other symptoms include:  Stuffy nose or congestion.  Thick drainage from your nose.  Swelling and warmth over the affected sinuses.  Headache.  Upper toothache.  A cough that may get worse at night.  Extra mucus that collects in the throat or the back of the nose (postnasal drip).  Decreased sense of smell and taste.  Fatigue.  A  fever.  Sore throat.  Bad breath. How is this diagnosed? This condition is diagnosed based on:  Your symptoms.  Your medical history.  A physical exam.  Tests to find out if your condition is acute or chronic. This may include: ? Checking your nose for nasal polyps. ? Viewing your sinuses using a device that has a light (endoscope). ? Testing for allergies or bacteria. ? Imaging tests, such as an MRI or CT scan. In rare cases, a bone biopsy may be done to rule out more serious types of fungal sinus disease. How is this treated? Treatment for sinusitis depends on the cause and whether your condition is chronic or acute.  If caused by a virus, your symptoms should go away on their own within 10 days. You may be given medicines to relieve symptoms. They include: ? Medicines that shrink swollen nasal passages (topical intranasal decongestants). ? Medicines that treat allergies (antihistamines). ? A spray that eases inflammation of the nostrils (topical intranasal corticosteroids). ? Rinses that help get rid of thick mucus in your nose (nasal saline washes).  If caused by bacteria, your health care provider may recommend waiting to see if your symptoms improve. Most bacterial infections will get better without antibiotic medicine. You may be given antibiotics if you have: ? A severe infection. ? A weak immune system.  If caused by narrow nasal passages or nasal polyps, you may need to have surgery. Follow these instructions at home: Medicines  Take, use, or apply over-the-counter and  prescription medicines only as told by your health care provider. These may include nasal sprays.  If you were prescribed an antibiotic medicine, take it as told by your health care provider. Do not stop taking the antibiotic even if you start to feel better. Hydrate and humidify   Drink enough fluid to keep your urine pale yellow. Staying hydrated will help to thin your mucus.  Use a cool mist  humidifier to keep the humidity level in your home above 50%.  Inhale steam for 10-15 minutes, 3-4 times a day, or as told by your health care provider. You can do this in the bathroom while a hot shower is running.  Limit your exposure to cool or dry air. Rest  Rest as much as possible.  Sleep with your head raised (elevated).  Make sure you get enough sleep each night. General instructions   Apply a warm, moist washcloth to your face 3-4 times a day or as told by your health care provider. This will help with discomfort.  Wash your hands often with soap and water to reduce your exposure to germs. If soap and water are not available, use hand sanitizer.  Do not smoke. Avoid being around people who are smoking (secondhand smoke).  Keep all follow-up visits as told by your health care provider. This is important. Contact a health care provider if:  You have a fever.  Your symptoms get worse.  Your symptoms do not improve within 10 days. Get help right away if:  You have a severe headache.  You have persistent vomiting.  You have severe pain or swelling around your face or eyes.  You have vision problems.  You develop confusion.  Your neck is stiff.  You have trouble breathing. Summary  Sinusitis is soreness and inflammation of your sinuses. Sinuses are hollow spaces in the bones around your face.  This condition is caused by nasal tissues that become inflamed or swollen. The swelling traps or blocks the flow of mucus. This allows bacteria, viruses, and fungi to grow, which leads to infection.  If you were prescribed an antibiotic medicine, take it as told by your health care provider. Do not stop taking the antibiotic even if you start to feel better.  Keep all follow-up visits as told by your health care provider. This is important. This information is not intended to replace advice given to you by your health care provider. Make sure you discuss any questions you  have with your health care provider. Document Revised: 04/05/2018 Document Reviewed: 04/05/2018 Elsevier Patient Education  [place].

## 2020-11-07 ENCOUNTER — Encounter: Payer: Self-pay | Admitting: Internal Medicine

## 2020-11-07 MED ORDER — BENZONATATE 100 MG PO CAPS
100.0000 mg | ORAL_CAPSULE | Freq: Three times a day (TID) | ORAL | 0 refills | Status: DC | PRN
Start: 2020-11-07 — End: 2021-10-04

## 2020-11-24 ENCOUNTER — Other Ambulatory Visit: Payer: Self-pay | Admitting: Internal Medicine

## 2020-11-25 ENCOUNTER — Encounter: Payer: Self-pay | Admitting: Internal Medicine

## 2020-11-26 ENCOUNTER — Other Ambulatory Visit: Payer: Self-pay | Admitting: Internal Medicine

## 2020-11-27 ENCOUNTER — Encounter: Payer: Self-pay | Admitting: Internal Medicine

## 2020-11-28 MED ORDER — FREESTYLE TEST VI STRP: ORAL_STRIP | 12 refills | Status: DC

## 2020-12-14 ENCOUNTER — Other Ambulatory Visit: Payer: Self-pay | Admitting: Internal Medicine

## 2021-01-06 ENCOUNTER — Other Ambulatory Visit: Payer: Self-pay | Admitting: Internal Medicine

## 2021-01-08 ENCOUNTER — Encounter: Payer: Self-pay | Admitting: Internal Medicine

## 2021-01-08 ENCOUNTER — Other Ambulatory Visit: Payer: Self-pay | Admitting: Internal Medicine

## 2021-01-10 ENCOUNTER — Telehealth: Payer: Self-pay | Admitting: Pharmacist

## 2021-01-14 ENCOUNTER — Encounter: Payer: Self-pay | Admitting: Internal Medicine

## 2021-01-14 NOTE — Patient Instructions (Addendum)
  Blood work was ordered.  Have your bone density   Medications changes include :   none      Please followup in 6 months

## 2021-01-14 NOTE — Progress Notes (Signed)
Subjective:    Patient ID: Hannah Bryant, female    DOB: [birth year], 75 y.o.   MRN: [id number]  HPI The patient is here for follow up of their chronic medical problems, including DM, htn, hyperlipidemia, GERD, chronic left sided abd pain/cramping, osteoporosis   She sees Dr Cruzita Lederer for DM. Her sugars have been variable and not ideally controlled.     Medications and allergies reviewed with patient and updated if appropriate.  Patient Active Problem List   Diagnosis Date Noted  . Allergic rhinitis 07/25/2020  . Nausea 10/22/2018  . Dizziness 10/22/2018  . Vitamin D insufficiency 09/27/2018  . Epigastric pain 09/15/2018  . Right rotator cuff tear 05/11/2017  . Cyst of joint of shoulder 03/04/2017  . Left sided abdominal pain 03/25/2016  . Type 2 diabetes mellitus with hypoglycemia without coma, without long-term current use of insulin ([place]) 02/28/2016  . Reactive hypoglycemia 02/28/2016  . Retrocalcaneal bursitis 02/26/2016  . Osteoporosis 01/09/2016  . Essential hypertension, benign 01/09/2016  . Achilles tendinosis 06/22/2015  . Junctional nevus of left thigh 12/23/2014  . Left rotator cuff tear arthropathy 08/21/2014  . Squamous cell skin cancer 10/06/2013  . BENIGN POSITIONAL VERTIGO 08/27/2009  . History of colonic polyps 05/09/2008  . Gastroparesis 11/15/2007  . DIVERTICULOSIS, COLON 11/15/2007  . GASTROESOPHAGEAL REFLUX DISEASE, SEVERE 05/12/2007  . Nonspecific abnormal electrocardiogram (ECG) (EKG) 05/12/2007  . Hyperlipidemia 05/07/2007    Current Outpatient Medications on File Prior to Visit  Medication Sig Dispense Refill  . acetaminophen (TYLENOL ARTHRITIS PAIN) 650 MG CR tablet Take 650 mg by mouth. 2 by mouth 2 times daily    . aspirin 81 MG chewable tablet Chew 81 mg by mouth daily.    Marland Kitchen atorvastatin (LIPITOR) 40 MG tablet TAKE 1 TABLET BY MOUTH EVERY DAY 90 tablet 1  . benzonatate (TESSALON) 100 MG capsule Take 1-2 capsules (100-200 mg total) by  mouth 3 (three) times daily as needed. 60 capsule 0  . Calcium-Vitamin D-Vitamin K (VIACTIV PO) Take by mouth. Reported on 02/28/2016    . Continuous Blood Gluc Sensor (FREESTYLE LIBRE 14 DAY SENSOR) MISC APPLY 1 SENSOR EVERY 14 (FOURTEEN) DAYS. CHANGE EVERY 2 WEEKS 2 each 11  . Cyanocobalamin (B-12) 1000 MCG TABS Take 1,000 mcg by mouth.    . esomeprazole (NEXIUM) 40 MG capsule Take 1 capsule (40 mg total) by mouth daily. --- Needs office visit before further refills   30 capsule 2  . fexofenadine (ALLEGRA) 180 MG tablet Take 180 mg by mouth daily.    Marland Kitchen glucose blood (FREESTYLE TEST STRIPS) test strip FreeStyle Precision Neo test strips.  Use as instructed to check blood sugar 1 time a day as a back up for the Freestyle Libre CGM 100 each 12  . hydrochlorothiazide (MICROZIDE) 12.5 MG capsule TAKE 1 CAPSULE BY MOUTH EVERY DAY 90 capsule 1  . hyoscyamine (LEVSIN SL) 0.125 MG SL tablet DISSOLVE 1-2 TABLETS UNDER TONGUE EVERY 4-6 HOURS AS NEEDED FOR ABDOMINAL PAIN AND CRAMPING 300 tablet 0  . Lactobacillus (PROBIOTIC ACIDOPHILUS PO) Take by mouth.    . Lancets MISC by Does not apply route. Lifespan ultra 2 test strips and lancets    . montelukast (SINGULAIR) 10 MG tablet TAKE 1 TABLET BY MOUTH EVERY DAY AS NEEDED 90 tablet 1  . ondansetron (ZOFRAN ODT) 4 MG disintegrating tablet Take 1 tablet (4 mg total) by mouth every 8 (eight) hours as needed for nausea or vomiting. 30 tablet 0  . OVER THE  COUNTER MEDICATION multistrain probiotic    . sodium chloride (OCEAN) 0.65 % SOLN nasal spray Place 1 spray into both nostrils as needed for congestion.    Marland Kitchen VITAMIN D, CHOLECALCIFEROL, PO Take by mouth.     No current facility-administered medications on file prior to visit.    Past Medical History:  Diagnosis Date  . Abnormal finding on EKG    non specific T changes; short PR  . Arthritis   . Bursitis    tronchanteric  . Complication of anesthesia    Severe PONV  . Diabetes mellitus without  complication ([place])    Pt states she is pre-diabetic (12-06-13)  . Diverticulosis   . Gastroparesis    93 % [place] at 2 hrs  . GERD (gastroesophageal reflux disease)   . H/O dilation and curettage   . Hernia    hiatal  . Hiatal hernia   . Hyperlipidemia   . Hyperlipidemia   . hypoglycemia   . 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 CARDIAC CATHETERIZATION  2005   < 30 % lesion  . COLONOSCOPY W/ POLYPECTOMY  2006 ,2007 & 2015   negative 2009; Dr Fuller Plan  . DILATATION & CURETTAGE/HYSTEROSCOPY WITH TRUECLEAR N/A 01/11/2014   Procedure: DILATATION & CURETTAGE/HYSTEROSCOPY WITH TRUCLEAR;  Surgeon: Anastasio Auerbach, MD;  Location: [facility];  Service: Gynecology;  Laterality: N/A;  . ESOPHAGEAL MANOMETRY  03/22/15   Decreased peristalsis  . Esophagram  03/29/15   Persistent narrowing at site of hiatal hernia; proximal dilation  . G 3 P 3    . HIATAL HERNIA REPAIR  2000  . LEG SURGERY     Trauma LLE/pins and plates  . myomectomy  1999   uterine fibroid  . ROTATOR CUFF REPAIR Right 05/2017  . TRIGGER FINGER RELEASE Right   . TUBAL LIGATION    . Upper endoscopy and esophageal dilation  02/20/15   Dr Roney Mans , [facility]    Social History   Socioeconomic History  . Marital status: Married    Spouse name: Not on file  . Number of children: 2  . Years of education: Not on file  . Highest education level: Not on file  Occupational History    Employer: UNEMPLOYED  Tobacco Use  . Smoking status: Former Smoker    Types: Cigarettes    Quit date: 11/17/1980    Years since quitting: 40.1  . Smokeless tobacco: Never Used  . Tobacco comment: smoked 1962-1982, up to 1 ppd  Vaping Use  . Vaping Use: Never used  Substance and Sexual Activity  . Alcohol use: Yes    Alcohol/week: 2.0 standard drinks    Types: 2 Glasses of wine per week    Comment: socially,2 / week  . Drug use: No  . Sexual activity: Yes    Birth  control/protection: Post-menopausal  Other Topics Concern  . Not on file  Social History Narrative   Daily caffeine use   Married   Veterinary surgeon intermittently   Social Determinants of Health   Financial Resource Strain: Low Risk   . Difficulty of Paying Living Expenses: Not hard at all  Food Insecurity: Not on file  Transportation Needs: Not on file  Physical Activity: Not on file  Stress: Not on file  Social Connections: Not on file    Family History  Problem Relation Age of Onset  . Ulcers Father   .  Diabetes Father   . Hypertension Father   . Heart attack Father        > 44  . Stroke Father        in 59s  . Heart attack Mother 35  . Diabetes Sister   . Heart attack Sister 96       CABG X 4  . Cancer Neg Hx   . Colitis Neg Hx     Review of Systems  Constitutional: Negative for chills and fever.  Respiratory: Negative for cough, shortness of breath and wheezing.   Cardiovascular: Negative for chest pain, palpitations and leg swelling.  Neurological: Negative for light-headedness and headaches.       Objective:   Vitals:   01/15/21 1026  BP: 136/82  Pulse: 68  Temp: 98.1 F (36.7 C)  SpO2: 96%   BP Readings from Last 3 Encounters:  01/15/21 136/82  10/31/20 132/70  10/20/20 (!) 153/86   Wt Readings from Last 3 Encounters:  01/15/21 149 lb (67.6 kg)  10/31/20 145 lb (65.8 kg)  07/25/20 150 lb (68 kg)   Body mass index is 25.58 kg/m.   Physical Exam    Constitutional: Appears well-developed and well-nourished. No distress.  HENT:  Head: Normocephalic and atraumatic.  Neck: Neck supple. No tracheal deviation present. No thyromegaly present.  No cervical lymphadenopathy Cardiovascular: Normal rate, regular rhythm and normal heart sounds.   No murmur heard. No carotid bruit .  No edema Pulmonary/Chest: Effort normal and breath sounds normal. No respiratory distress. No has no wheezes. No rales.  Skin: Skin is warm and dry. Not  diaphoretic.  Psychiatric: Normal mood and affect. Behavior is normal.      Assessment & Plan:    See Problem List for Assessment and Plan of chronic medical problems.    This visit occurred during the SARS-CoV-2 public health emergency.  Safety protocols were in place, including screening questions prior to the visit, additional usage of staff PPE, and extensive cleaning of exam room while observing appropriate contact time as indicated for disinfecting solutions.

## 2021-01-15 ENCOUNTER — Ambulatory Visit (INDEPENDENT_AMBULATORY_CARE_PROVIDER_SITE_OTHER): Payer: Medicare Other | Admitting: Internal Medicine

## 2021-01-15 ENCOUNTER — Other Ambulatory Visit: Payer: Self-pay

## 2021-01-15 VITALS — BP 136/82 | HR 68 | Temp 98.1°F | Wt 149.0 lb

## 2021-01-15 DIAGNOSIS — R109 Unspecified abdominal pain: Secondary | ICD-10-CM

## 2021-01-15 DIAGNOSIS — K219 Gastro-esophageal reflux disease without esophagitis: Secondary | ICD-10-CM

## 2021-01-15 DIAGNOSIS — M7062 Trochanteric bursitis, left hip: Secondary | ICD-10-CM | POA: Diagnosis not present

## 2021-01-15 DIAGNOSIS — M79644 Pain in right finger(s): Secondary | ICD-10-CM | POA: Diagnosis not present

## 2021-01-15 DIAGNOSIS — M810 Age-related osteoporosis without current pathological fracture: Secondary | ICD-10-CM

## 2021-01-15 DIAGNOSIS — I10 Essential (primary) hypertension: Secondary | ICD-10-CM

## 2021-01-15 DIAGNOSIS — E11649 Type 2 diabetes mellitus with hypoglycemia without coma: Secondary | ICD-10-CM | POA: Diagnosis not present

## 2021-01-15 DIAGNOSIS — E7849 Other hyperlipidemia: Secondary | ICD-10-CM

## 2021-01-15 NOTE — Assessment & Plan Note (Signed)
Chronic BP well controlled Continue hctz 12.5 mg daily cmp  

## 2021-01-15 NOTE — Assessment & Plan Note (Signed)
Chronic She has been thinking about her options and she would consider Prolia-we will look into her insurance to see what the cost would be She knows she needs to update her bone density-DEXA ordered and she will schedule Stressed regular exercise Continue calcium and vitamin D Check vitamin D level

## 2021-01-15 NOTE — Assessment & Plan Note (Signed)
Chronic GERD controlled Continue nexium 40 mg daily  

## 2021-01-15 NOTE — Assessment & Plan Note (Signed)
Chronic Check lipid panel, CMP Continue atorvastatin 40 mg daily

## 2021-01-15 NOTE — Assessment & Plan Note (Signed)
Chronic Management per Cruzita Lederer She has an upcoming appointment with her next week

## 2021-01-15 NOTE — Assessment & Plan Note (Signed)
Chronic Related to intestinal cramping in left lower quadrant Continue hyoscyamine-takes 1-2 tabs 3-4 times a day Overall controlled

## 2021-01-16 DIAGNOSIS — H04123 Dry eye syndrome of bilateral lacrimal glands: Secondary | ICD-10-CM | POA: Diagnosis not present

## 2021-01-16 DIAGNOSIS — H43393 Other vitreous opacities, bilateral: Secondary | ICD-10-CM | POA: Diagnosis not present

## 2021-01-16 DIAGNOSIS — H2513 Age-related nuclear cataract, bilateral: Secondary | ICD-10-CM | POA: Diagnosis not present

## 2021-01-16 DIAGNOSIS — H524 Presbyopia: Secondary | ICD-10-CM | POA: Diagnosis not present

## 2021-01-16 DIAGNOSIS — H10503 Unspecified blepharoconjunctivitis, bilateral: Secondary | ICD-10-CM | POA: Diagnosis not present

## 2021-01-17 NOTE — Progress Notes (Signed)
Chronic Care Management Pharmacy Assistant   Name: KATERYNA GRANTHAM  MRN: [id number] DOB: [birth year]  Reason for Encounter: Hypertension Adherence Call   PCP : Binnie Rail, MD  Allergies:   Allergies  Allergen Reactions   Domperidone     Stomach cramps   Fentanyl Nausea And Vomiting   Monistat [Miconazole Nitrate]     SEVERE BURNING   Nsaids     REACTION: stomach complaints   Oxycodone Hcl     REACTION: VOMITTING   Codeine     nausea   Erythromycin Ethylsuccinate     gastritis    Medications: Outpatient Encounter Medications as of 01/10/2021  Medication Sig   acetaminophen (TYLENOL ARTHRITIS PAIN) 650 MG CR tablet Take 650 mg by mouth. 2 by mouth 2 times daily   aspirin 81 MG chewable tablet Chew 81 mg by mouth daily.   atorvastatin (LIPITOR) 40 MG tablet TAKE 1 TABLET BY MOUTH EVERY DAY   benzonatate (TESSALON) 100 MG capsule Take 1-2 capsules (100-200 mg total) by mouth 3 (three) times daily as needed.   Calcium-Vitamin D-Vitamin K (VIACTIV PO) Take by mouth. Reported on 02/28/2016   Continuous Blood Gluc Sensor (FREESTYLE LIBRE 14 DAY SENSOR) MISC APPLY 1 SENSOR EVERY 14 (FOURTEEN) DAYS. CHANGE EVERY 2 WEEKS   Cyanocobalamin (B-12) 1000 MCG TABS Take 1,000 mcg by mouth.   esomeprazole (NEXIUM) 40 MG capsule Take 1 capsule (40 mg total) by mouth daily. --- Needs office visit before further refills     fexofenadine (ALLEGRA) 180 MG tablet Take 180 mg by mouth daily.   glucose blood (FREESTYLE TEST STRIPS) test strip FreeStyle Precision Neo test strips.  Use as instructed to check blood sugar 1 time a day as a back up for the Freestyle Libre CGM   hydrochlorothiazide (MICROZIDE) 12.5 MG capsule TAKE 1 CAPSULE BY MOUTH EVERY DAY   hyoscyamine (LEVSIN SL) 0.125 MG SL tablet DISSOLVE 1-2 TABLETS UNDER TONGUE EVERY 4-6 HOURS AS NEEDED FOR ABDOMINAL PAIN AND CRAMPING   Lactobacillus (PROBIOTIC ACIDOPHILUS PO) Take by mouth.   Lancets MISC by Does not  apply route. Lifespan ultra 2 test strips and lancets   montelukast (SINGULAIR) 10 MG tablet TAKE 1 TABLET BY MOUTH EVERY DAY AS NEEDED   ondansetron (ZOFRAN ODT) 4 MG disintegrating tablet Take 1 tablet (4 mg total) by mouth every 8 (eight) hours as needed for nausea or vomiting.   OVER THE COUNTER MEDICATION multistrain probiotic   sodium chloride (OCEAN) 0.65 % SOLN nasal spray Place 1 spray into both nostrils as needed for congestion.   VITAMIN D, CHOLECALCIFEROL, PO Take by mouth.   No facility-administered encounter medications on file as of 01/10/2021.    Current Diagnosis: Patient Active Problem List   Diagnosis Date Noted   Allergic rhinitis 07/25/2020   Nausea 10/22/2018   Dizziness 10/22/2018   Vitamin D insufficiency 09/27/2018   Epigastric pain 09/15/2018   Right rotator cuff tear 05/11/2017   Cyst of joint of shoulder 03/04/2017   Left sided abdominal pain 03/25/2016   Type 2 diabetes mellitus with hypoglycemia without coma, without long-term current use of insulin ([place]) 02/28/2016   Reactive hypoglycemia 02/28/2016   Retrocalcaneal bursitis 02/26/2016   Osteoporosis 01/09/2016   Essential hypertension, benign 01/09/2016   Achilles tendinosis 06/22/2015   Junctional nevus of left thigh 12/23/2014   Left rotator cuff tear arthropathy 08/21/2014   Squamous cell skin cancer 10/06/2013   BENIGN POSITIONAL VERTIGO 08/27/2009   History of colonic polyps  05/09/2008   Gastroparesis 11/15/2007   DIVERTICULOSIS, COLON 11/15/2007   GASTROESOPHAGEAL REFLUX DISEASE, SEVERE 05/12/2007   Nonspecific abnormal electrocardiogram (ECG) (EKG) 05/12/2007   Hyperlipidemia 05/07/2007    Goals Addressed   None     Follow-Up:  Pharmacist Review   Reviewed chart prior to disease state call. Spoke with patient regarding BP  Recent Office Vitals: BP Readings from Last 3 Encounters:  01/15/21 136/82  10/31/20 132/70  10/20/20 (!) 153/86   Pulse  Readings from Last 3 Encounters:  01/15/21 68  10/31/20 66  10/20/20 82    Wt Readings from Last 3 Encounters:  01/15/21 149 lb (67.6 kg)  10/31/20 145 lb (65.8 kg)  07/25/20 150 lb (68 kg)     Kidney Function Lab Results  Component Value Date/Time   CREATININE 0.65 07/25/2020 02:06 PM   CREATININE 0.72 09/09/2019 07:45 AM   CREATININE 0.72 09/15/2018 12:00 PM   CREATININE 0.71 04/01/2017 11:45 AM   GFR 79.01 09/09/2019 07:45 AM   GFRNONAA 87 07/25/2020 02:06 PM   GFRAA 101 07/25/2020 02:06 PM    BMP Latest Ref Rng & Units 07/25/2020 09/09/2019 09/15/2018  Glucose 65 - 99 mg/dL 114(H) 120(H) 109(H)  BUN 7 - 25 mg/dL 14 15 14   Creatinine 0.60 - 0.93 mg/dL 0.65 0.72 0.72  BUN/Creat Ratio 6 - 22 (calc) NOT APPLICABLE - -  Sodium 364 - 146 mmol/L 142 142 142  Potassium 3.5 - 5.3 mmol/L 3.4(L) 3.4(L) 4.0  Chloride 98 - 110 mmol/L 100 102 101  CO2 20 - 32 mmol/L 32 31 33(H)  Calcium 8.6 - 10.4 mg/dL 9.5 9.2 9.6     Current antihypertensive regimen: The patient is taking HCTZ 12.5 mg   How often are you checking your Blood Pressure? The patient does not check blood pressure daily   Current home BP readings: The patient does not have any home readings   What recent interventions/DTPs have been made by any provider to improve Blood Pressure control since last CPP Visit: None   Any recent hospitalizations or ED visits since last visit with CPP? The patient has not been to the hospital or the ED   What diet changes have been made to improve Blood Pressure Control? The patient has had no changes in diet   What exercise is being done to improve your Blood Pressure Control? The patient is not doing any type of exercise   Adherence Review: Is the patient currently on ACE/ARB medication? No Does the patient have >5 day gap between last estimated fill dates? [place], [place]   Time spent:15   Wendy Poet

## 2021-01-18 ENCOUNTER — Ambulatory Visit (INDEPENDENT_AMBULATORY_CARE_PROVIDER_SITE_OTHER)
Admission: RE | Admit: 2021-01-18 | Discharge: 2021-01-18 | Disposition: A | Discharge status: Home / Self Care | Payer: Medicare Other | Source: Ambulatory Visit | Attending: Internal Medicine | Admitting: Internal Medicine

## 2021-01-18 ENCOUNTER — Other Ambulatory Visit: Payer: Self-pay

## 2021-01-18 DIAGNOSIS — M810 Age-related osteoporosis without current pathological fracture: Secondary | ICD-10-CM

## 2021-01-19 ENCOUNTER — Encounter: Payer: Self-pay | Admitting: Internal Medicine

## 2021-01-21 ENCOUNTER — Encounter: Payer: Self-pay | Admitting: Internal Medicine

## 2021-01-22 ENCOUNTER — Ambulatory Visit (INDEPENDENT_AMBULATORY_CARE_PROVIDER_SITE_OTHER): Payer: Medicare Other | Admitting: Internal Medicine

## 2021-01-22 ENCOUNTER — Encounter: Payer: Self-pay | Admitting: Internal Medicine

## 2021-01-22 ENCOUNTER — Other Ambulatory Visit: Payer: Self-pay

## 2021-01-22 VITALS — BP 138/80 | HR 65 | Ht 64.0 in | Wt 149.8 lb

## 2021-01-22 DIAGNOSIS — E11649 Type 2 diabetes mellitus with hypoglycemia without coma: Secondary | ICD-10-CM

## 2021-01-22 DIAGNOSIS — M810 Age-related osteoporosis without current pathological fracture: Secondary | ICD-10-CM | POA: Diagnosis not present

## 2021-01-22 DIAGNOSIS — E161 Other hypoglycemia: Secondary | ICD-10-CM | POA: Diagnosis not present

## 2021-01-22 DIAGNOSIS — E559 Vitamin D deficiency, unspecified: Secondary | ICD-10-CM | POA: Diagnosis not present

## 2021-01-22 LAB — POCT GLYCOSYLATED HEMOGLOBIN (HGB A1C): Hemoglobin A1C: 6.4 % — AB (ref 4.0–5.6)

## 2021-01-22 LAB — VITAMIN D 25 HYDROXY (VIT D DEFICIENCY, FRACTURES): VITD: 38.19 ng/mL (ref 30.00–100.00)

## 2021-01-22 MED ORDER — FREESTYLE SYSTEM KIT: 1.0000 | PACK | 0 refills | Status: DC | PRN

## 2021-01-22 NOTE — Progress Notes (Signed)
Patient ID: Hannah Bryant, female   DOB: Apr 18, 1946, 75 y.o.   MRN: [id number]  This visit occurred during the SARS-CoV-2 public health emergency.  Safety protocols were in place, including screening questions prior to the visit, additional usage of staff PPE, and extensive cleaning of exam room while observing appropriate contact time as indicated for disinfecting solutions.   HPI: Hannah Bryant is a 75 y.o.-year-old female, presenting for f/u for DM2, dx in ~2009 (or earlier), diet-controlled, with complications (reactive hypoglycemia) and osteoporosis.  Previously seen by Dr. Chalmers Bryant. Last 06/2016.  Last visit with me 6 months ago.  She had a steroid inj for trochanteric bursitis last week >> CBG 360. Now improved.  Reactive hypoglycemia:  Reviewed history: Pt described that has had hypoglycemia every pm for "ages". Sometimes: sweating, feeling hot, nausea, sometimes vomiting.  She may need to take a nap afterward.  She started to see Dr Hannah Bryant (Hannah Bryant) >> hypoglycemic episodes = better. She had an EGD, Ba swallow test >> she had a stricture at the site of her previous hiatal hernia with food retention in the Esophagus long after she eats (procedure: Spring 2016). She had Esophageal dilation. She also had a capsule endoscopy.  Her hypoglycemia improved after esophageal stretching.  Lowest CBG were previously in the 50s but lately in the 60s.  She corrects these with OJ or honey sticks. Lunch is a sandwich/wrap + yoghurt at 1 pm and snack at 3 pm (cheese or PB+apples or PB crackers), supper at 7:30 pm.  DM2:  HbA1c levels reviewed: Lab Results  Component Value Date   HGBA1C 6.4 (A) 07/19/2020   HGBA1C 6.6 (H) 09/09/2019   HGBA1C 6.5 09/15/2018   She has previously on diabetic medications but she stopped that she was not feeling well on them.  She would not want to restart.  She checks her sugars more than 4 times a day with her freestyle libre CGM.  CGM  parameters:    Previously:   Lowest sugar was: LO (CGM) >> 42 >> 50s. She has hypoglycemia awareness in the 80s. Highest sugar was 200s >> 300s.  Glucometer: 4: OneTouch Verio and Freestyle  She saw nutrition in 2016.  She has oatmeal + butter for b'fast.  -No CKD, last BUN/creatinine:  Lab Results  Component Value Date   BUN 14 07/25/2020   CREATININE 0.65 07/25/2020   -+ HL; last set of lipids: Lab Results  Component Value Date   CHOL 168 07/25/2020   HDL 67 07/25/2020   LDLCALC 78 07/25/2020   TRIG 132 07/25/2020   CHOLHDL 2.5 07/25/2020  On Lipitor.  She has a history of a humerus fracture in 01/2017 (however, this was not seen on x-ray...). She has osteopenia on DXA but with her history of humerus fracture, she has clinical osteoporosis:  Reviewed her previous DXA scan reports.  02/08/2017 Lumbar spine (L1-L4) Femoral neck (FN)  T-score 0.6 RFN:-2.0 LFN:-1.6  Change in BMD from previous DXA test (%) Down 2.0% Down 4.3%    02/03/2021 Lumbar spine L1- L3 (L4) Femoral neck (FN)  T-score  +1.2 RFN: -2.2 LFN: -2.2  Change in BMD from previous DXA test (%)  +5.1%* -6.6%*  (*) statistically significant  I suggested Prolia at previous visits and we discussed about benefits and possible side effects.  She prefers to wait until her dental implants were completed to decide about this (after approximately 9 months).   density scan.  Vitamin D insufficiency:  Reviewed vitamin  D levels: Lab Results  Component Value Date   VD25OH 31.7 07/19/2020   VD25OH 97.50 09/09/2019   VD25OH 33.78 09/27/2018   VD25OH 24.24 (L) 03/24/2018   VD25OH 28.68 (L) 03/04/2017   She was on 1000 units vitamin D daily >> stopped by PCP >> restarted 500 units daily. Now 1000 units for few months.  She visited [place] summer 2019.  ROS: Constitutional: no weight gain/no weight loss, no fatigue, no subjective hyperthermia, no subjective hypothermia Eyes: no blurry vision, no  xerophthalmia ENT: no sore throat, no nodules palpated in neck, no dysphagia, no odynophagia, no hoarseness Cardiovascular: no CP/no SOB/no palpitations/no leg swelling Respiratory: no cough/no SOB/no wheezing Gastrointestinal: no N/no V/no D/no C/no acid reflux Musculoskeletal: no muscle aches/no joint aches Skin: no rashes, no hair loss Neurological: no tremors/no numbness/no tingling/no dizziness  I reviewed pt's medications, allergies, PMH, social hx, family hx, and changes were documented in the history of present illness. Otherwise, unchanged from my initial visit note.  History   Social History  . Marital Status: Married    Spouse Name: N/A  . Number of Children: 2   Social History Main Topics  . Smoking status: Former Smoker    Types: Cigarettes    Quit date: 11/17/1980  . Smokeless tobacco: Never Used     Comment: smoked 1962-1982, up to 1 ppd  . Alcohol Use: 1.2 oz/week    2 Glasses of wine per week     Comment: socially,2 / week  . Drug Use: No  . Sexual Activity: Yes    Birth Control/ Protection: Post-menopausal   Social History Narrative   Daily caffeine use   Married   Veterinary surgeon intermittently   Past Medical History:  Diagnosis Date  . Abnormal finding on EKG    non specific T changes; short PR  . Arthritis   . Bursitis    tronchanteric  . Complication of anesthesia    Severe PONV  . Diabetes mellitus without complication ([place])    Pt states she is pre-diabetic (12-06-13)  . Diverticulosis   . Gastroparesis    93 % retenton at 2 hrs  . GERD (gastroesophageal reflux disease)   . H/O dilation and curettage   . Hernia    hiatal  . Hiatal hernia   . Hyperlipidemia   . Hyperlipidemia   . hypoglycemia   . 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 CARDIAC CATHETERIZATION  2005   < 30 % lesion  . COLONOSCOPY W/ POLYPECTOMY  2006 ,2007 & 2015   negative  2009; Dr Hannah Bryant  . DILATATION & CURETTAGE/HYSTEROSCOPY WITH TRUECLEAR N/A 01/11/2014   Procedure: DILATATION & CURETTAGE/HYSTEROSCOPY WITH TRUCLEAR;  Surgeon: Hannah Auerbach, MD;  Location: [facility];  Service: Gynecology;  Laterality: N/A;  . ESOPHAGEAL MANOMETRY  03/22/15   Decreased peristalsis  . Esophagram  03/29/15   Persistent narrowing at site of hiatal hernia; proximal dilation  . G 3 P 3    . HIATAL HERNIA REPAIR  2000  . LEG SURGERY     Trauma LLE/pins and plates  . myomectomy  1999   uterine fibroid  . ROTATOR CUFF REPAIR Right 05/2017  . TRIGGER FINGER RELEASE Right   . TUBAL LIGATION    . Upper endoscopy and esophageal dilation  02/20/15   Dr Hannah Bryant , [facility]   Current Outpatient Medications on File Prior to Visit  Medication Sig Dispense  Refill  . acetaminophen (TYLENOL ARTHRITIS PAIN) 650 MG CR tablet Take 650 mg by mouth. 2 by mouth 2 times daily    . aspirin 81 MG chewable tablet Chew 81 mg by mouth daily.    Marland Kitchen atorvastatin (LIPITOR) 40 MG tablet TAKE 1 TABLET BY MOUTH EVERY DAY 90 tablet 1  . benzonatate (TESSALON) 100 MG capsule Take 1-2 capsules (100-200 mg total) by mouth 3 (three) times daily as needed. 60 capsule 0  . Calcium-Vitamin D-Vitamin K (VIACTIV PO) Take by mouth. Reported on 02/28/2016    . Continuous Blood Gluc Sensor (FREESTYLE LIBRE 14 DAY SENSOR) MISC APPLY 1 SENSOR EVERY 14 (FOURTEEN) DAYS. CHANGE EVERY 2 WEEKS 2 each 11  . Cyanocobalamin (B-12) 1000 MCG TABS Take 1,000 mcg by mouth.    . esomeprazole (NEXIUM) 40 MG capsule Take 1 capsule (40 mg total) by mouth daily. --- Needs office visit before further refills   30 capsule 2  . fexofenadine (ALLEGRA) 180 MG tablet Take 180 mg by mouth daily.    Marland Kitchen glucose blood (FREESTYLE TEST STRIPS) test strip FreeStyle Precision Neo test strips.  Use as instructed to check blood sugar 1 time a day as a back up for the Freestyle Libre CGM 100 each 12  . hydrochlorothiazide (MICROZIDE) 12.5 MG capsule TAKE 1 CAPSULE BY  MOUTH EVERY DAY 90 capsule 1  . hyoscyamine (LEVSIN SL) 0.125 MG SL tablet DISSOLVE 1-2 TABLETS UNDER TONGUE EVERY 4-6 HOURS AS NEEDED FOR ABDOMINAL PAIN AND CRAMPING 300 tablet 0  . Lactobacillus (PROBIOTIC ACIDOPHILUS PO) Take by mouth.    . Lancets MISC by Does not apply route. Lifespan ultra 2 test strips and lancets    . montelukast (SINGULAIR) 10 MG tablet TAKE 1 TABLET BY MOUTH EVERY DAY AS NEEDED 90 tablet 1  . ondansetron (ZOFRAN ODT) 4 MG disintegrating tablet Take 1 tablet (4 mg total) by mouth every 8 (eight) hours as needed for nausea or vomiting. 30 tablet 0  . OVER THE COUNTER MEDICATION multistrain probiotic    . sodium chloride (OCEAN) 0.65 % SOLN nasal spray Place 1 spray into both nostrils as needed for congestion.    Marland Kitchen VITAMIN D, CHOLECALCIFEROL, PO Take by mouth.     No current facility-administered medications on file prior to visit.   Allergies  Allergen Reactions  . Domperidone     Stomach cramps  . Fentanyl Nausea And Vomiting  . Monistat [Miconazole Nitrate]     SEVERE BURNING  . Nsaids     REACTION: stomach complaints  . Oxycodone Hcl     REACTION: VOMITTING  . Codeine     nausea  . Erythromycin Ethylsuccinate     gastritis   Family History  Problem Relation Age of Onset  . Ulcers Father   . Diabetes Father   . Hypertension Father   . Heart attack Father        > 54  . Stroke Father        in 28s  . Heart attack Mother 73  . Diabetes Sister   . Heart attack Sister 64       CABG X 4  . Cancer Neg Hx   . Colitis Neg Hx    PE: BP 138/80 (BP Location: Left Arm, Patient Position: Sitting, Cuff Size: Small)   Pulse 65   Ht 5\' 4"  (1.626 m)   Wt 149 lb 12.8 oz (67.9 kg)   LMP 11/18/2000   SpO2 98%   BMI 25.71 kg/m  Body mass index is 25.71 kg/m. Wt Readings from Last 3 Encounters:  01/22/21 149 lb 12.8 oz (67.9 kg)  01/15/21 149 lb (67.6 kg)  10/31/20 145 lb (65.8 kg)   Constitutional: normal weight, in NAD Eyes: PERRLA, EOMI, no  exophthalmos ENT: moist mucous membranes, no thyromegaly, no cervical lymphadenopathy Cardiovascular: RRR, No MRG Respiratory: CTA B Gastrointestinal: abdomen soft, NT, ND, BS+ Musculoskeletal: no deformities, strength intact in all 4 Skin: moist, warm, no rashes Neurological: no tremor with outstretched hands, DTR normal in all 4  ASSESSMENT: 1. Diet-controlled DM2, without complications.  2. Hypoglycemia - possibly reactive - improved after her esophageal stretching - We ruled out adrenal insufficiency: Component     Latest Ref Rng 02/02/2015 02/02/2015 02/02/2015         9:21 AM 10:02 AM 10:02 AM  Cortisol, Plasma      7.1 29.1 24.2  C206 ACTH     6 - 50 pg/mL 12     3.  Osteopenia - + h/o Humeral fracture  4.  Vitamin D insufficiency  [place]:  1. DM2 -Diet controlled -She continues to use the freestyle libre CGM -Latest HbA1c was slightly lower, at 6.4% at last visit - at today's visit, HbA1c is 6.4% (stable) -Return to clinic in 6 months  2.  History of reactive/postprandial hyperglycemia -Also see above -Possibly related to her gastroparesis -At last visit, she did not report significant lows.  Per review of the CGM, there were some sugars in the 60s but we discussed that these may be 20 to 30 mg/dL lower than the glucometer values.  She continues to not drinki liquids with meals.  In the past, she was having sweets with lunch and sugars are dropping in the afternoon.  We discussed about limiting concentrated sweets and fluids with lunch.  I also advised her to add healthy fats with breakfast 10 to have a snack later in the day.  We reviewed the 15-15 rule for correcting hypoglycemia at that time. CGM interpretation: -At today's visit, we reviewed her CGM downloads: It appears that 75% of values are in target range (goal >70%), while 25% are higher than 180 (goal <25%), and 0% are lower than 70 (goal <4%).  The calculated average blood sugar is 157.   -Reviewing the CGM  trends, the sugars have been higher than before, but the downloads are from when she started the steroids, and therefore not today representative.  Reviewing the general trends, sugars are stable overnight but they increase after breakfast, lunch, and dinner.  We do have gaps in the data and I advised her to scan the device frequently, at least every 8 hours.  For now, I would not suggest diabetic medication changes, but now that we saw what significant effect steroids have on her blood sugars, we discussed about decreasing the dose of steroids or trying to avoid them if possible.  3. Osteopenia + fracture >> osteoporosis -Likely age-related + postmenopausal -No falls or fractures since last visit -She does qualify for treatment as she has clinical osteoporosis (osteopenia on the bone density scan and also history of humeral fracture) -She is due for another bone density scan but she still did not have this yet, as she is waiting for the coronavirus pandemic to wind down. I gave her the telephone number for our [place] office to call and schedule at her convenience. -We did discuss about Prolia in the past but we were waiting for the bone density scan to see if  we can start. -As of now, she mentions that she has dental implants planned and she would like to finish with them before starting Prolia.  4.  Vitamin D insufficiency -At last check, in 08/2019, vitamin D was close to the upper limit of normal at 97.5 >> we stopped her vitamin D supplement at that time. -However, afterwards, we restarted 500 units daily >> now 1000 units daily -Latest vitamin D level was normal in 07/2020, but low in the normal range - will recheck today  Component     Latest Ref Rng & Units 01/22/2021  Hemoglobin A1C     4.0 - 5.6 % 6.4 (A)  VITD     30.00 - 100.00 ng/mL 38.19  Vitamin D is still in the normal range, I agree with continuing 1000 units, rather than 500 units daily.  Philemon Kingdom, MD PhD [facility]  Endocrinology

## 2021-01-22 NOTE — Patient Instructions (Addendum)
Please stop at the lab.  Please come back for a follow-up appointment in 6 months.  

## 2021-01-23 ENCOUNTER — Other Ambulatory Visit: Payer: Self-pay | Admitting: Internal Medicine

## 2021-01-28 DIAGNOSIS — Z1231 Encounter for screening mammogram for malignant neoplasm of breast: Secondary | ICD-10-CM | POA: Diagnosis not present

## 2021-01-28 LAB — HM MAMMOGRAPHY

## 2021-01-30 ENCOUNTER — Telehealth: Payer: Self-pay | Admitting: Internal Medicine

## 2021-01-30 DIAGNOSIS — E11649 Type 2 diabetes mellitus with hypoglycemia without coma: Secondary | ICD-10-CM

## 2021-01-30 NOTE — Telephone Encounter (Signed)
[facility] called because they need clarification on a prescription sent in for the [facility] glucometer.   Callback # [phone] can talk to anyone

## 2021-02-01 DIAGNOSIS — Z23 Encounter for immunization: Secondary | ICD-10-CM | POA: Diagnosis not present

## 2021-02-04 MED ORDER — FREESTYLE FREEDOM LITE W/DEVICE KIT: PACK | 0 refills | Status: DC

## 2021-02-04 NOTE — Telephone Encounter (Signed)
Called and confirmed with pt request for freestyle freedom meter. Rx sent to preferred pharmacy.

## 2021-02-13 ENCOUNTER — Other Ambulatory Visit: Payer: Self-pay | Admitting: Internal Medicine

## 2021-02-14 ENCOUNTER — Encounter: Payer: Self-pay | Admitting: Internal Medicine

## 2021-02-14 NOTE — Progress Notes (Signed)
Outside notes received. Information abstracted. Notes sent to scan.  

## 2021-02-18 DIAGNOSIS — H2513 Age-related nuclear cataract, bilateral: Secondary | ICD-10-CM | POA: Diagnosis not present

## 2021-02-18 DIAGNOSIS — H2511 Age-related nuclear cataract, right eye: Secondary | ICD-10-CM | POA: Diagnosis not present

## 2021-02-22 ENCOUNTER — Telehealth: Payer: Medicare Other

## 2021-03-12 ENCOUNTER — Other Ambulatory Visit: Payer: Self-pay | Admitting: Internal Medicine

## 2021-03-15 ENCOUNTER — Other Ambulatory Visit: Payer: Self-pay | Admitting: Internal Medicine

## 2021-03-20 ENCOUNTER — Other Ambulatory Visit (INDEPENDENT_AMBULATORY_CARE_PROVIDER_SITE_OTHER): Payer: Medicare Other

## 2021-03-20 ENCOUNTER — Ambulatory Visit: Payer: Medicare Other

## 2021-03-20 DIAGNOSIS — E7849 Other hyperlipidemia: Secondary | ICD-10-CM | POA: Diagnosis not present

## 2021-03-20 DIAGNOSIS — M810 Age-related osteoporosis without current pathological fracture: Secondary | ICD-10-CM | POA: Diagnosis not present

## 2021-03-20 DIAGNOSIS — E11649 Type 2 diabetes mellitus with hypoglycemia without coma: Secondary | ICD-10-CM | POA: Diagnosis not present

## 2021-03-20 DIAGNOSIS — I10 Essential (primary) hypertension: Secondary | ICD-10-CM

## 2021-03-20 LAB — CBC WITH DIFFERENTIAL/PLATELET
Basophils Absolute: 0 10*3/uL (ref 0.0–0.1)
Basophils Relative: 0.4 % (ref 0.0–3.0)
Eosinophils Absolute: 0.2 10*3/uL (ref 0.0–0.7)
Eosinophils Relative: 2.9 % (ref 0.0–5.0)
HCT: 40.6 % (ref 36.0–46.0)
Hemoglobin: 13.7 g/dL (ref 12.0–15.0)
Lymphocytes Relative: 38.6 % (ref 12.0–46.0)
Lymphs Abs: 2.5 10*3/uL (ref 0.7–4.0)
MCHC: 33.8 g/dL (ref 30.0–36.0)
MCV: 96.3 fl (ref 78.0–100.0)
Monocytes Absolute: 0.5 10*3/uL (ref 0.1–1.0)
Monocytes Relative: 7.6 % (ref 3.0–12.0)
Neutro Abs: 3.3 10*3/uL (ref 1.4–7.7)
Neutrophils Relative %: 50.5 % (ref 43.0–77.0)
Platelets: 151 10*3/uL (ref 150.0–400.0)
RBC: 4.21 Mil/uL (ref 3.87–5.11)
RDW: 13.1 % (ref 11.5–15.5)
WBC: 6.6 10*3/uL (ref 4.0–10.5)

## 2021-03-20 LAB — COMPREHENSIVE METABOLIC PANEL
ALT: 12 U/L (ref 0–35)
AST: 16 U/L (ref 0–37)
Albumin: 4.2 g/dL (ref 3.5–5.2)
Alkaline Phosphatase: 56 U/L (ref 39–117)
BUN: 18 mg/dL (ref 6–23)
CO2: 33 mEq/L — ABNORMAL HIGH (ref 19–32)
Calcium: 9.4 mg/dL (ref 8.4–10.5)
Chloride: 101 mEq/L (ref 96–112)
Creatinine, Ser: 0.74 mg/dL (ref 0.40–1.20)
GFR: 78.64 mL/min (ref >60.00)
Glucose, Bld: 122 mg/dL — ABNORMAL HIGH (ref 70–99)
Potassium: 3.5 mEq/L (ref 3.5–5.1)
Sodium: 142 mEq/L (ref 135–145)
Total Bilirubin: 0.6 mg/dL (ref 0.2–1.2)
Total Protein: 6.5 g/dL (ref 6.0–8.3)

## 2021-03-20 LAB — LIPID PANEL
Cholesterol: 147 mg/dL (ref 0–200)
HDL: 59.9 mg/dL (ref >39.00)
LDL Cholesterol: 64 mg/dL (ref 0–99)
NonHDL: 86.91
Total CHOL/HDL Ratio: 2
Triglycerides: 116 mg/dL (ref 0.0–149.0)
VLDL: 23.2 mg/dL (ref 0.0–40.0)

## 2021-03-20 LAB — VITAMIN D 25 HYDROXY (VIT D DEFICIENCY, FRACTURES): VITD: 37.68 ng/mL (ref 30.00–100.00)

## 2021-03-26 DIAGNOSIS — H2511 Age-related nuclear cataract, right eye: Secondary | ICD-10-CM | POA: Diagnosis not present

## 2021-04-01 DIAGNOSIS — M7062 Trochanteric bursitis, left hip: Secondary | ICD-10-CM | POA: Diagnosis not present

## 2021-04-01 DIAGNOSIS — H2512 Age-related nuclear cataract, left eye: Secondary | ICD-10-CM | POA: Diagnosis not present

## 2021-04-02 ENCOUNTER — Ambulatory Visit: Payer: Medicare Other

## 2021-04-06 ENCOUNTER — Other Ambulatory Visit: Payer: Self-pay | Admitting: Internal Medicine

## 2021-04-09 ENCOUNTER — Ambulatory Visit: Payer: Medicare Other

## 2021-04-09 DIAGNOSIS — H2512 Age-related nuclear cataract, left eye: Secondary | ICD-10-CM | POA: Diagnosis not present

## 2021-04-29 ENCOUNTER — Encounter: Payer: Self-pay | Admitting: Internal Medicine

## 2021-04-29 MED ORDER — FLUCONAZOLE 150 MG PO TABS: 150.0000 mg | ORAL_TABLET | Freq: Once | ORAL | 0 refills | Status: AC

## 2021-05-09 NOTE — Telephone Encounter (Signed)
[place] ID: [id number] Insurance: Medicare A/B and Shannon G No out of pocket expense, deductible met  No authorization required  Patient notified of benefits via [facility]

## 2021-05-10 ENCOUNTER — Telehealth: Payer: Self-pay | Admitting: Pharmacist

## 2021-05-10 NOTE — Progress Notes (Signed)
Chronic Care Management Pharmacy Assistant   Name: Hannah Bryant  MRN: [id number] DOB: [birth year]   Reason for Encounter: Disease State   Conditions to be addressed/monitored: General Call   Recent office visits:  01/15/21 [name], Internal Medicine 01/22/21 Dr. Philemon Kingdom   Recent consult visits:  None ID   Hospital visits:  None in previous 6 months  Medications: Outpatient Encounter Medications as of 05/10/2021  Medication Sig   hyoscyamine (LEVSIN SL) 0.125 MG SL tablet DISSOLVE 1-2 TABLETS UNDER TONGUE EVERY 4-6 HOURS AS NEEDED FOR ABDOMINAL PAIN AND CRAMPING   acetaminophen (TYLENOL ARTHRITIS PAIN) 650 MG CR tablet Take 650 mg by mouth. 2 by mouth 2 times daily   aspirin 81 MG chewable tablet Chew 81 mg by mouth daily.   atorvastatin (LIPITOR) 40 MG tablet TAKE 1 TABLET BY MOUTH EVERY DAY   benzonatate (TESSALON) 100 MG capsule Take 1-2 capsules (100-200 mg total) by mouth 3 (three) times daily as needed.   Blood Glucose Monitoring Suppl (FREESTYLE FREEDOM LITE) w/Device KIT Use as instructed to check blood sugar 4 times daily.   Calcium-Vitamin D-Vitamin K (VIACTIV PO) Take by mouth. Reported on 02/28/2016   Continuous Blood Gluc Sensor (FREESTYLE LIBRE 14 DAY SENSOR) MISC APPLY 1 SENSOR EVERY 14 DAYS. CHANGE EVERY 2 WEEKS   Cyanocobalamin (B-12) 1000 MCG TABS Take 1,000 mcg by mouth.   esomeprazole (NEXIUM) 40 MG capsule Take 1 capsule (40 mg total) by mouth daily. --- Needs office visit before further refills      fexofenadine (ALLEGRA) 180 MG tablet Take 180 mg by mouth daily.   glucose blood (FREESTYLE TEST STRIPS) test strip FreeStyle Precision Neo test strips.  Use as instructed to check blood sugar 1 time a day as a back up for the Freestyle Libre CGM   hydrochlorothiazide (MICROZIDE) 12.5 MG capsule TAKE 1 CAPSULE BY MOUTH EVERY DAY   Lactobacillus (PROBIOTIC ACIDOPHILUS PO) Take by mouth.   Lancets MISC by Does not apply route. Lifespan ultra 2 test  strips and lancets   montelukast (SINGULAIR) 10 MG tablet TAKE 1 TABLET BY MOUTH EVERY DAY AS NEEDED   ondansetron (ZOFRAN ODT) 4 MG disintegrating tablet Take 1 tablet (4 mg total) by mouth every 8 (eight) hours as needed for nausea or vomiting.   OVER THE COUNTER MEDICATION multistrain probiotic   sodium chloride (OCEAN) 0.65 % SOLN nasal spray Place 1 spray into both nostrils as needed for congestion.   VITAMIN D, CHOLECALCIFEROL, PO Take by mouth.   No facility-administered encounter medications on file as of 05/10/2021.    Pharmacist Pharmacist  Have you had any problems recently with your health? Patient states that she is doing well and does not have any new health issues at this time  Have you had any problems with your pharmacy? Patient states that she I not having any problems with getting her medications or the cost of medications from the pharmacy  What issues or side effects are you having with your medications? Patient states that she is not having any side effects from medications  What would you like me to pass along to [facility], CPP for them to help you with? Patient states she is doing well and does not have any concerns about health or medications at this time  What can we do to take care of you better?  Patient states that if any changes she will contact [name] office  Star Rating Drugs: Atorvastatin 03/28/21 90 ds  [place] Pharmacist Assistant [phone]   Time spent:20

## 2021-05-14 ENCOUNTER — Other Ambulatory Visit: Payer: Self-pay | Admitting: Internal Medicine

## 2021-06-20 DIAGNOSIS — U071 COVID-19: Secondary | ICD-10-CM | POA: Diagnosis not present

## 2021-06-20 DIAGNOSIS — Z23 Encounter for immunization: Secondary | ICD-10-CM | POA: Diagnosis not present

## 2021-07-04 DIAGNOSIS — Z85828 Personal history of other malignant neoplasm of skin: Secondary | ICD-10-CM | POA: Diagnosis not present

## 2021-07-04 DIAGNOSIS — D225 Melanocytic nevi of trunk: Secondary | ICD-10-CM | POA: Diagnosis not present

## 2021-07-04 DIAGNOSIS — D1801 Hemangioma of skin and subcutaneous tissue: Secondary | ICD-10-CM | POA: Diagnosis not present

## 2021-07-04 DIAGNOSIS — L821 Other seborrheic keratosis: Secondary | ICD-10-CM | POA: Diagnosis not present

## 2021-07-08 ENCOUNTER — Other Ambulatory Visit: Payer: Self-pay | Admitting: Internal Medicine

## 2021-07-31 ENCOUNTER — Ambulatory Visit: Payer: Medicare Other | Admitting: Internal Medicine

## 2021-08-07 DIAGNOSIS — Z23 Encounter for immunization: Secondary | ICD-10-CM | POA: Diagnosis not present

## 2021-08-12 DIAGNOSIS — M79671 Pain in right foot: Secondary | ICD-10-CM | POA: Diagnosis not present

## 2021-08-12 DIAGNOSIS — M25562 Pain in left knee: Secondary | ICD-10-CM | POA: Diagnosis not present

## 2021-08-14 DIAGNOSIS — Z23 Encounter for immunization: Secondary | ICD-10-CM | POA: Diagnosis not present

## 2021-08-17 ENCOUNTER — Telehealth: Payer: Self-pay

## 2021-08-17 NOTE — Telephone Encounter (Signed)
Prolia VOB initiated via [email or web address]  Last OV:  Next OV:  Last Prolia inj: NEW START Next Prolia inj DUE:   

## 2021-08-19 NOTE — Telephone Encounter (Signed)
Pt ready for scheduling on or after 08/19/21 Per note from June - wanted to wait until after upcoming surgery.  Out-of-pocket cost due at time of visit: $0.00  Primary:  Prolia co-insurance: 20% (approximately $255) Admin fee co-insurance: 20% (approximately $25)  Secondary: [employer] Prolia co-insurance: Covers Medicare Part B co-insurance and 100% of excess charges [employer] fee co-insurance: Covers Medicare Part B co-insurance and 100% of excess charges  Deductible: $233 of $233 met  Prior Auth: not required PA# Valid:   ** This summary of benefits is an estimation of the patient's out-of-pocket cost. Exact cost may vary based on individual plan coverage.

## 2021-09-02 ENCOUNTER — Telehealth: Payer: Self-pay

## 2021-09-02 NOTE — Progress Notes (Signed)
    Chronic Care Management Pharmacy Assistant   Name: Hannah Bryant  MRN: [id number] DOB: [birth year]   Reason for Encounter: Disease State   Conditions to be addressed/monitored: General   Recent office visits:  None ID  Recent consult visits:  None ID  Hospital visits:  None in previous 6 months  Medications: Outpatient Encounter Medications as of 09/02/2021  Medication Sig   hyoscyamine (LEVSIN SL) 0.125 MG SL tablet DISSOLVE 1-2 TABLETS UNDER TONGUE EVERY 4-6 HOURS AS NEEDED FOR ABDOMINAL PAIN AND CRAMPING   acetaminophen (TYLENOL ARTHRITIS PAIN) 650 MG CR tablet Take 650 mg by mouth. 2 by mouth 2 times daily   aspirin 81 MG chewable tablet Chew 81 mg by mouth daily.   atorvastatin (LIPITOR) 40 MG tablet TAKE 1 TABLET BY MOUTH EVERY DAY   benzonatate (TESSALON) 100 MG capsule Take 1-2 capsules (100-200 mg total) by mouth 3 (three) times daily as needed.   Blood Glucose Monitoring Suppl (FREESTYLE FREEDOM LITE) w/Device KIT Use as instructed to check blood sugar 4 times daily.   Calcium-Vitamin D-Vitamin K (VIACTIV PO) Take by mouth. Reported on 02/28/2016   Continuous Blood Gluc Sensor (FREESTYLE LIBRE 14 DAY SENSOR) MISC APPLY 1 SENSOR EVERY 14 DAYS. CHANGE EVERY 2 WEEKS   Cyanocobalamin (B-12) 1000 MCG TABS Take 1,000 mcg by mouth.   esomeprazole (NEXIUM) 40 MG capsule Take 1 capsule (40 mg total) by mouth daily. --- Needs office visit before further refills      fexofenadine (ALLEGRA) 180 MG tablet Take 180 mg by mouth daily.   glucose blood (FREESTYLE TEST STRIPS) test strip FreeStyle Precision Neo test strips.  Use as instructed to check blood sugar 1 time a day as a back up for the Freestyle Libre CGM   hydrochlorothiazide (MICROZIDE) 12.5 MG capsule TAKE 1 CAPSULE BY MOUTH EVERY DAY   Lactobacillus (PROBIOTIC ACIDOPHILUS PO) Take by mouth.   Lancets MISC by Does not apply route. Lifespan ultra 2 test strips and lancets   montelukast (SINGULAIR) 10 MG tablet TAKE 1  TABLET BY MOUTH EVERY DAY AS NEEDED   ondansetron (ZOFRAN ODT) 4 MG disintegrating tablet Take 1 tablet (4 mg total) by mouth every 8 (eight) hours as needed for nausea or vomiting.   OVER THE COUNTER MEDICATION multistrain probiotic   sodium chloride (OCEAN) 0.65 % SOLN nasal spray Place 1 spray into both nostrils as needed for congestion.   VITAMIN D, CHOLECALCIFEROL, PO Take by mouth.   No facility-administered encounter medications on file as of 09/02/2021.   Have you had any problems recently with your health?Patient states that she does not have any new health issues  Have you had any problems with your pharmacy?Patient states that she does not have any problems with getting her medications or the cost of her medications from the pharmacy  What issues or side effects are you having with your medications?Patient states that she does not have any side effects from medications  What would you like me to pass along to [facility] for them to help you with? Patient states that she does not have any concerns about her health or medications  What can we do to take care of you better? Patient states she is doing fine  Care Gaps: Colonoscopy-01/17/14 Diabetic Foot Exam-07/25/20 Mammogram-NA Ophthalmology-06/21/18 Dexa Scan - 01/18/21 Annual Well Visit - NA Micro albumin-07/25/20 Hemoglobin A1c- 01/22/21  Star Rating Drugs: Atorvastatin 06/17/21 90 ds   [place] Pharmacist Assistant [phone]

## 2021-10-04 ENCOUNTER — Encounter: Payer: Self-pay | Admitting: Internal Medicine

## 2021-10-04 ENCOUNTER — Other Ambulatory Visit: Payer: Self-pay

## 2021-10-04 ENCOUNTER — Ambulatory Visit (INDEPENDENT_AMBULATORY_CARE_PROVIDER_SITE_OTHER): Payer: Medicare Other | Admitting: Internal Medicine

## 2021-10-04 VITALS — BP 128/80 | HR 65 | Ht 64.0 in | Wt 154.6 lb

## 2021-10-04 DIAGNOSIS — E161 Other hypoglycemia: Secondary | ICD-10-CM

## 2021-10-04 DIAGNOSIS — E11649 Type 2 diabetes mellitus with hypoglycemia without coma: Secondary | ICD-10-CM

## 2021-10-04 DIAGNOSIS — M810 Age-related osteoporosis without current pathological fracture: Secondary | ICD-10-CM | POA: Diagnosis not present

## 2021-10-04 DIAGNOSIS — E559 Vitamin D deficiency, unspecified: Secondary | ICD-10-CM | POA: Diagnosis not present

## 2021-10-04 LAB — POCT GLYCOSYLATED HEMOGLOBIN (HGB A1C): Hemoglobin A1C: 6.4 % — AB (ref 4.0–5.6)

## 2021-10-04 NOTE — Patient Instructions (Addendum)
Please come back for a follow-up appointment in 6 months.  Thinka bout starting Prolia and let me know if you decide for it.  Denosumab injection What is this medication? DENOSUMAB (den oh sue mab) slows bone breakdown. Prolia is used to treat osteoporosis in women after menopause and in men, and in people who are taking corticosteroids for 6 months or more. Delton See is used to treat a high calcium level due to cancer and to prevent bone fractures and other bone problems caused by multiple myeloma or cancer bone metastases. Delton See is also used to treat giant cell tumor of the bone. This medicine may be used for other purposes; ask your health care provider or pharmacist if you have questions. COMMON BRAND NAME(S): Prolia, XGEVA What should I tell my care team before I take this medication? They need to know if you have any of these conditions: dental disease having surgery or tooth extraction infection kidney disease low levels of calcium or Vitamin D in the blood malnutrition on hemodialysis skin conditions or sensitivity thyroid or parathyroid disease an unusual reaction to denosumab, other medicines, foods, dyes, or preservatives pregnant or trying to get pregnant breast-feeding How should I use this medication? This medicine is for injection under the skin. It is given by a health care professional in a hospital or clinic setting. A special MedGuide will be given to you before each treatment. Be sure to read this information carefully each time. For Prolia, talk to your pediatrician regarding the use of this medicine in children. Special care may be needed. For Delton See, talk to your pediatrician regarding the use of this medicine in children. While this drug may be prescribed for children as young as 13 years for selected conditions, precautions do apply. Overdosage: If you think you have taken too much of this medicine contact a poison control center or emergency room at once. NOTE: This  medicine is only for you. Do not share this medicine with others. What if I miss a dose? It is important not to miss your dose. Call your doctor or health care professional if you are unable to keep an appointment. What may interact with this medication? Do not take this medicine with any of the following medications: other medicines containing denosumab This medicine may also interact with the following medications: medicines that lower your chance of fighting infection steroid medicines like prednisone or cortisone This list may not describe all possible interactions. Give your health care provider a list of all the medicines, herbs, non-prescription drugs, or dietary supplements you use. Also tell them if you smoke, drink alcohol, or use illegal drugs. Some items may interact with your medicine. What should I watch for while using this medication? Visit your doctor or health care professional for regular checks on your progress. Your doctor or health care professional may order blood tests and other tests to see how you are doing. Call your doctor or health care professional for advice if you get a fever, chills or sore throat, or other symptoms of a cold or flu. Do not treat yourself. This drug may decrease your body's ability to fight infection. Try to avoid being around people who are sick. You should make sure you get enough calcium and vitamin D while you are taking this medicine, unless your doctor tells you not to. Discuss the foods you eat and the vitamins you take with your health care professional. See your dentist regularly. Brush and floss your teeth as directed. Before you have  any dental work done, tell your dentist you are receiving this medicine. Do not become pregnant while taking this medicine or for 5 months after stopping it. Talk with your doctor or health care professional about your birth control options while taking this medicine. Women should inform their doctor if they  wish to become pregnant or think they might be pregnant. There is a potential for serious side effects to an unborn child. Talk to your health care professional or pharmacist for more information. What side effects may I notice from receiving this medication? Side effects that you should report to your doctor or health care professional as soon as possible: allergic reactions like skin rash, itching or hives, swelling of the face, lips, or tongue bone pain breathing problems dizziness jaw pain, especially after dental work redness, blistering, peeling of the skin signs and symptoms of infection like fever or chills; cough; sore throat; pain or trouble passing urine signs of low calcium like fast heartbeat, muscle cramps or muscle pain; pain, tingling, numbness in the hands or feet; seizures unusual bleeding or bruising unusually weak or tired Side effects that usually do not require medical attention (report to your doctor or health care professional if they continue or are bothersome): constipation diarrhea headache joint pain loss of appetite muscle pain runny nose tiredness upset stomach This list may not describe all possible side effects. Call your doctor for medical advice about side effects. You may report side effects to FDA at 1-800-FDA-1088. Where should I keep my medication? This medicine is only given in a clinic, doctor's office, or other health care setting and will not be stored at home. NOTE: This sheet is a summary. It may not cover all possible information. If you have questions about this medicine, talk to your doctor, pharmacist, or health care provider.  2022 Elsevier/Gold Standard (2018-03-12 00:00:00)

## 2021-10-04 NOTE — Progress Notes (Signed)
Patient ID: Hannah Bryant, female   DOB: 23-May-1946, 75 y.o.   MRN: [id number]  This visit occurred during the SARS-CoV-2 public health emergency.  Safety protocols were in place, including screening questions prior to the visit, additional usage of staff PPE, and extensive cleaning of exam room while observing appropriate contact time as indicated for disinfecting solutions.   HPI: Hannah Bryant is a 75 y.o.-year-old female, presenting for f/u for DM2, dx in ~2009 (or earlier), diet-controlled, with complications (reactive hypoglycemia) and osteoporosis.  Previously seen by Dr. Chalmers Cater. Last 06/2016.  Last visit with me 6 months ago.  Interim history: No falls or fractures since last visit.   No increased urination, blurry vision, nausea, chest pain. She mentions that she had more lows in the 40s in her sleep and the pm per her CGM >> checked by fingersticks: 50-60s. Takes candy/OJ. She does not feel different during the episodes and mentions that she may not feel them unless she scans the sensor.  Reactive hypoglycemia:  Reviewed history: Pt described that has had hypoglycemia every pm for "ages". Sometimes: sweating, feeling hot, nausea, sometimes vomiting.  She may need to take a nap afterward.  She started to see Dr Roney Mans ([place]) >> hypoglycemic episodes = better. She had an EGD, Ba swallow test >> she had a stricture at the site of her previous hiatal hernia with food retention in the Esophagus long after she eats (procedure: Spring 2016). She had Esophageal dilation. She also had a capsule endoscopy.  Her hypoglycemia improved after esophageal stretching.  Lowest CBG were previously in the 50s but lately in the 60s.  She corrects these with OJ or honey sticks. Lunch is a sandwich/wrap + yoghurt at 1 pm and snack at 3 pm (cheese or PB+apples or PB crackers), supper at 7:30 pm.  DM2:  HbA1c levels reviewed: Lab Results  Component Value Date   HGBA1C 6.4 (A) 01/22/2021    HGBA1C 6.4 (A) 07/19/2020   HGBA1C 6.6 (H) 09/09/2019   She has previously on diabetic medications but she stopped that she was not feeling well on them.  She would not want to restart.  She checks her sugars more than 4 times a day with her freestyle libre CGM.   Previously:   Previously:    Lowest sugar was: LO (CGM) >> 42 >> 50s >> 40s (CGM). She has hypoglycemia awareness in the 80s. Highest sugar was 200s >> 300s >> 200s.  Glucometer: 4: OneTouch Verio and Freestyle  She saw nutrition in 2016.  She has oatmeal + butter for b'fast.  -No CKD, last BUN/creatinine:  Lab Results  Component Value Date   BUN 18 03/20/2021   CREATININE 0.74 03/20/2021   -+ HL; last set of lipids: Lab Results  Component Value Date   CHOL 147 03/20/2021   HDL 59.90 03/20/2021   LDLCALC 64 03/20/2021   TRIG 116.0 03/20/2021   CHOLHDL 2 03/20/2021  On Lipitor.  She has a history of a humerus fracture in 01/2017 (however, this was not seen on x-ray...). She has osteopenia on DXA but with her history of humerus fracture, she has clinical osteoporosis:  Reviewed her previous DXA scan reports: 01/18/2021 Lumbar spine L1- L3 (L4) Femoral neck (FN)  T-score  +1.2 RFN: -2.2 LFN: -2.2  Change in BMD from previous DXA test (%)  +5.1%* -6.6%*  (*) statistically significant  02/08/2017 Lumbar spine (L1-L4) Femoral neck (FN)  T-score 0.6 RFN:-2.0 LFN:-1.6  Change in BMD from  previous DXA test (%) Down 2.0% Down 4.3%    02/03/2021 Lumbar spine L1- L3 (L4) Femoral neck (FN)  T-score  +1.2 RFN: -2.2 LFN: -2.2  Change in BMD from previous DXA test (%)  +5.1%* -6.6%*  (*) statistically significant  I suggested Prolia at previous visits and we discussed about benefits and possible side effects.  She prefers to wait until her dental implants were completed to decide about this (after approximately 9 months).   density scan.  Vitamin D insufficiency:  Reviewed vitamin D levels: Lab Results   Component Value Date   VD25OH 37.68 03/20/2021   VD25OH 38.19 01/22/2021   VD25OH 31.7 07/19/2020   VD25OH 97.50 09/09/2019   VD25OH 33.78 09/27/2018   VD25OH 24.24 (L) 03/24/2018   VD25OH 28.68 (L) 03/04/2017   She was on 1000 units vitamin D daily >> stopped by PCP >> restarted 500 units daily. Now 1000 units for few months.  She visited [place] summer 2019.  ROS: + see HPI  I reviewed pt's medications, allergies, PMH, social hx, family hx, and changes were documented in the history of present illness. Otherwise, unchanged from my initial visit note.  History   Social History   Marital Status: Married    Spouse Name: N/A   Number of Children: 2   Social History Main Topics   Smoking status: Former Smoker    Types: Cigarettes    Quit date: 11/17/1980   Smokeless tobacco: Never Used     Comment: smoked 1962-1982, up to 1 ppd   Alcohol Use: 1.2 oz/week    2 Glasses of wine per week     Comment: socially,2 / week   Drug Use: No   Sexual Activity: Yes    Birth Control/ Protection: Post-menopausal   Social History Narrative   Daily caffeine use   Married   Veterinary surgeon intermittently   Past Medical History:  Diagnosis Date   Abnormal finding on EKG    non specific T changes; short PR   Arthritis    Bursitis    tronchanteric   Complication of anesthesia    Severe PONV   Diabetes mellitus without complication ([place])    Pt states she is pre-diabetic (12-06-13)   Diverticulosis    Gastroparesis    93 % retenton at 2 hrs   GERD (gastroesophageal reflux disease)    H/O dilation and curettage    Hernia    hiatal   Hiatal hernia    Hyperlipidemia    Hyperlipidemia    hypoglycemia    Tubulovillous adenoma polyp of colon 06/2005   Hyperplastic 2007   Past Surgical History:  Procedure Laterality Date   CARDIAC CATHETERIZATION  2005   < 30 % lesion   COLONOSCOPY W/ POLYPECTOMY  2006 ,2007 & 2015   negative 2009; Dr Fuller Plan   DILATATION &  CURETTAGE/HYSTEROSCOPY WITH TRUECLEAR N/A 01/11/2014   Procedure: DILATATION & CURETTAGE/HYSTEROSCOPY WITH TRUCLEAR;  Surgeon: Anastasio Auerbach, MD;  Location: [facility];  Service: Gynecology;  Laterality: N/A;   ESOPHAGEAL MANOMETRY  03/22/15   Decreased peristalsis   Esophagram  03/29/15   Persistent narrowing at site of hiatal hernia; proximal dilation   G 3 P 3     HIATAL HERNIA REPAIR  2000   LEG SURGERY     Trauma LLE/pins and plates   myomectomy  1999   uterine fibroid   ROTATOR CUFF REPAIR Right 05/2017   TRIGGER FINGER RELEASE Right    TUBAL LIGATION  Upper endoscopy and esophageal dilation  02/20/15   Dr Roney Mans , [facility]   Current Outpatient Medications on File Prior to Visit  Medication Sig Dispense Refill   hyoscyamine (LEVSIN SL) 0.125 MG SL tablet DISSOLVE 1-2 TABLETS UNDER TONGUE EVERY 4-6 HOURS AS NEEDED FOR ABDOMINAL PAIN AND CRAMPING 360 tablet 5   acetaminophen (TYLENOL ARTHRITIS PAIN) 650 MG CR tablet Take 650 mg by mouth. 2 by mouth 2 times daily     aspirin 81 MG chewable tablet Chew 81 mg by mouth daily.     atorvastatin (LIPITOR) 40 MG tablet TAKE 1 TABLET BY MOUTH EVERY DAY 90 tablet 1   benzonatate (TESSALON) 100 MG capsule Take 1-2 capsules (100-200 mg total) by mouth 3 (three) times daily as needed. 60 capsule 0   Blood Glucose Monitoring Suppl (FREESTYLE FREEDOM LITE) w/Device KIT Use as instructed to check blood sugar 4 times daily. 1 kit 0   Calcium-Vitamin D-Vitamin K (VIACTIV PO) Take by mouth. Reported on 02/28/2016     Continuous Blood Gluc Sensor (FREESTYLE LIBRE 14 DAY SENSOR) MISC APPLY 1 SENSOR EVERY 14 DAYS. CHANGE EVERY 2 WEEKS 2 each 3   Cyanocobalamin (B-12) 1000 MCG TABS Take 1,000 mcg by mouth.     esomeprazole (NEXIUM) 40 MG capsule Take 1 capsule (40 mg total) by mouth daily. --- Needs office visit before further refills    30 capsule 2   fexofenadine (ALLEGRA) 180 MG tablet Take 180 mg by mouth daily.     glucose blood (FREESTYLE TEST STRIPS)  test strip FreeStyle Precision Neo test strips.  Use as instructed to check blood sugar 1 time a day as a back up for the Freestyle Libre CGM 100 each 12   hydrochlorothiazide (MICROZIDE) 12.5 MG capsule TAKE 1 CAPSULE BY MOUTH EVERY DAY 90 capsule 1   Lactobacillus (PROBIOTIC ACIDOPHILUS PO) Take by mouth.     Lancets MISC by Does not apply route. Lifespan ultra 2 test strips and lancets     montelukast (SINGULAIR) 10 MG tablet TAKE 1 TABLET BY MOUTH EVERY DAY AS NEEDED 90 tablet 1   ondansetron (ZOFRAN ODT) 4 MG disintegrating tablet Take 1 tablet (4 mg total) by mouth every 8 (eight) hours as needed for nausea or vomiting. 30 tablet 0   OVER THE COUNTER MEDICATION multistrain probiotic     sodium chloride (OCEAN) 0.65 % SOLN nasal spray Place 1 spray into both nostrils as needed for congestion.     VITAMIN D, CHOLECALCIFEROL, PO Take by mouth.     No current facility-administered medications on file prior to visit.   Allergies  Allergen Reactions   Domperidone     Stomach cramps   Fentanyl Nausea And Vomiting   Monistat [Miconazole Nitrate]     SEVERE BURNING   Nsaids     REACTION: stomach complaints   Oxycodone Hcl     REACTION: VOMITTING   Codeine     nausea   Erythromycin Ethylsuccinate     gastritis   Family History  Problem Relation Age of Onset   Ulcers Father    Diabetes Father    Hypertension Father    Heart attack Father        > 16   Stroke Father        in 45s   Heart attack Mother 22   Diabetes Sister    Heart attack Sister 61       CABG X 4   Cancer Neg Hx    Colitis  Neg Hx    PE: BP 128/80 (BP Location: Left Arm, Patient Position: Sitting, Cuff Size: Normal)   Pulse 65   Ht 5' 4"  (1.626 m)   Wt 154 lb 9.6 oz (70.1 kg)   LMP 11/18/2000   SpO2 96%   BMI 26.54 kg/m   Wt Readings from Last 3 Encounters:  10/04/21 154 lb 9.6 oz (70.1 kg)  01/22/21 149 lb 12.8 oz (67.9 kg)  01/15/21 149 lb (67.6 kg)   Constitutional: normal weight, in NAD Eyes:  PERRLA, EOMI, no exophthalmos ENT: moist mucous membranes, no thyromegaly, no cervical lymphadenopathy Cardiovascular: RRR, No MRG Respiratory: CTA B Gastrointestinal: abdomen soft, NT, ND, BS+ Musculoskeletal: no deformities, strength intact in all 4 Skin: moist, warm, no rashes Neurological: no tremor with outstretched hands, DTR normal in all 4  ASSESSMENT: 1. Diet-controlled DM2, without complications.  2. Hypoglycemia - possibly reactive - Possibly related to a history of gastroparesis - improved after her esophageal stretching - We ruled out adrenal insufficiency: Component     Latest Ref Rng 02/02/2015 02/02/2015 02/02/2015         9:21 AM 10:02 AM 10:02 AM  Cortisol, Plasma      7.1 29.1 24.2  C206 ACTH     6 - 50 pg/mL 12     3.  Osteopenia - + h/o Humeral fracture  4.  Vitamin D insufficiency  PLAN:  1. DM2 and 2. History of reactive/postprandial hypoglycemia -Diet controlled -She continues to use the freestyle libre CGM -HbA1c at last visit was stable, at 6.4% CGM interpretation: -At today's visit, we reviewed her CGM downloads: It appears that 94% of values are in target range (goal >70%), while 5% are higher than 180 (goal <25%), and 1% are lower than 70 (goal <4%), with 0% of blood sugars lower than 54.  The calculated average blood sugar is 128.  The projected HbA1c for the next 3 months (GMI) is 6.4%. -Reviewing the CGM trends, it appears that the sugars appear to be fluctuating in the normal range, with only occasional spikes after meals.  They do not appear to decrease lower than 60s, except for one instance when they dropped to 44 after an initial  spike after lunch. -We discussed about the guidelines not recommending CGM monitoring for patients with reactive hypoglycemia due to increase anxiety.  I explained that an occasional drop in blood sugars under 70 may not be abnormal, especially on the CGM, which may show her readings that are 20 to 30 mg/dL lower than  they actually are and also, especially since she is not feeling them.  It is likely that her body is able to correct these.  This does not mean that she should not improve her diet to avoid hyperglycemic spikes, for example eliminating concentrated sweets (discussed about examples), since these decrease in glucose levels appear to happen after an initial hyperglycemic peak.  However, as of now, it looks like she is trying to chase the low blood sugars throughout the day and this appears to be decreasing her quality of life.  She mentions that she is not going out to eat at night if her sugars are low. -I did not suggest any intervention for now other than having sweets with her just in case she needs to correct the low blood sugars, but I did advise him to observe them rather than trying to correct them immediately as our bodies can usually correct mild lows.  At this visit, she tells me  that she is not happy that nothing should be done for this... -Discussed in the past about not drinking liquids with meals and avoiding concentrated sweets, also starting the meals with protein and fat and ending with carbs -She is aware of the 15-15 rule for correcting hypoglycemia. -at today's visit, HbA1c is 6.4% (stable) -We will see her back in 6 months  3. Osteopenia + fracture >> osteoporosis -Likely age-related + postmenopausal - no falls or fractures since last visit -She does qualify for treatment as she has clinical osteoporosis (osteopenia on the bone density scan + history of humeral fracture) -Reviewed latest bone density scan that showed lower femoral neck bone densities -We did discuss about Prolia and she requires more information at this visit- given written drug info and discussed about the benefits.  4.  Vitamin D insufficiency -She had a vitamin D level at the upper limit of normal, 97.5, in the past so we stopped her vitamin D supplement at that time. -We subsequently had to restart it back,  currently on 1000 units vitamin D daily -Latest vitamin D level was at goal, 38.19 in 01/2021 -We will continue the current supplement dose.  Philemon Kingdom, MD PhD [facility] Endocrin[facility]

## 2021-10-11 ENCOUNTER — Other Ambulatory Visit: Payer: Self-pay | Admitting: Internal Medicine

## 2021-10-22 NOTE — Telephone Encounter (Signed)
Spoke with pt to schedule appt for injection. Pt states she "has something else to do, call back in a couple weeks."

## 2021-11-30 NOTE — Telephone Encounter (Signed)
2023 VOB initiated for Prolia °

## 2021-12-03 NOTE — Telephone Encounter (Signed)
Pt ready for scheduling on or after 12/03/21 ° °Out-of-pocket cost due at time of visit: $226 (deductible) ° °Primary: Medicare °Prolia co-insurance: 20% (approximately $276) °Admin fee co-insurance: 20% (approximately $25) ° °Secondary: [employer] °Prolia co-insurance: Covers Medicare Part B co-insurance °Admin fee co-insurance: Covers Medicare Part B co-insurance ° °Deductible: $0 of $226 met, secondary does NOT cover deductible ° °Prior Auth: not required °PA# °Valid:  ° °** This summary of benefits is an estimation of the patient's out-of-pocket cost. Exact cost may vary based on individual plan coverage.  ° °

## 2021-12-04 NOTE — Telephone Encounter (Signed)
Left voicemail message for patient to call and schedule her prolia injection if she is still interested.  Told her out of pocket cost would be $226.00 payable at time of visit.

## 2021-12-07 ENCOUNTER — Other Ambulatory Visit: Payer: Self-pay | Admitting: Internal Medicine

## 2021-12-25 ENCOUNTER — Telehealth: Payer: Self-pay

## 2021-12-25 NOTE — Chronic Care Management (AMB) (Signed)
Chronic Care Management Pharmacy Assistant   Name: Hannah Bryant  MRN: [id number] DOB: 1946-08-25  Hannah Bryant is an 76 y.o. year old female who presents for his follow-up CCM visit with the clinical pharmacist.  Reason for Encounter: Disease State-General    Recent office visits:  10/04/21 Hannah Kingdom, MD-Internal Medicine (Type 2 diabetes mellitus) A1c ordered, no med changes  Recent consult visits:  None ID  Hospital visits:  None in previous 6 months  Medications: Outpatient Encounter Medications as of 12/25/2021  Medication Sig   acetaminophen (TYLENOL ARTHRITIS PAIN) 650 MG CR tablet Take 650 mg by mouth. 2 by mouth 2 times daily   aspirin 81 MG chewable tablet Chew 81 mg by mouth daily.   atorvastatin (LIPITOR) 40 MG tablet TAKE 1 TABLET BY MOUTH EVERY DAY   Blood Glucose Monitoring Suppl (FREESTYLE FREEDOM LITE) w/Device KIT Use as instructed to check blood sugar 4 times daily.   Calcium-Vitamin D-Vitamin K (VIACTIV PO) Take by mouth. Reported on 02/28/2016   Continuous Blood Gluc Sensor (FREESTYLE LIBRE 14 DAY SENSOR) MISC APPLY 1 SENSOR EVERY 14 DAYS. CHANGE EVERY 2 WEEKS   Cyanocobalamin (B-12) 1000 MCG TABS Take 1,000 mcg by mouth.   esomeprazole (NEXIUM) 40 MG capsule Take 1 capsule (40 mg total) by mouth daily. --- Needs office visit before further refills      fexofenadine (ALLEGRA) 180 MG tablet Take 180 mg by mouth daily.   glucose blood (FREESTYLE TEST STRIPS) test strip FreeStyle Precision Neo test strips.  Use as instructed to check blood sugar 1 time a day as a back up for the Freestyle Libre CGM   hydrochlorothiazide (MICROZIDE) 12.5 MG capsule TAKE 1 CAPSULE BY MOUTH EVERY DAY   hyoscyamine (LEVSIN SL) 0.125 MG SL tablet DISSOLVE 1-2 TABLETS UNDER TONGUE EVERY 4-6 HOURS AS NEEDED FOR ABDOMINAL PAIN AND CRAMPING   Lactobacillus (PROBIOTIC ACIDOPHILUS PO) Take by mouth.   Lancets MISC by Does not apply route. Lifespan ultra 2 test strips and  lancets   montelukast (SINGULAIR) 10 MG tablet TAKE 1 TABLET BY MOUTH EVERY DAY AS NEEDED   ondansetron (ZOFRAN ODT) 4 MG disintegrating tablet Take 1 tablet (4 mg total) by mouth every 8 (eight) hours as needed for nausea or vomiting.   OVER THE COUNTER MEDICATION multistrain probiotic   VITAMIN D, CHOLECALCIFEROL, PO Take by mouth.   No facility-administered encounter medications on file as of 12/25/2021.   Have you had any problems recently with your health?Patient states that she is doing fine and not having any new issues  Have you had any problems with your pharmacy?Patient is not having any problems with getting medications or the cost of medications from the pharmacy  What issues or side effects are you having with your medications?Patient is not having any side effects   What would you like me to pass along to Hannah Bryant for them to help you with? Patient states that she is going to make an appt to see Dr. Quay Bryant since last appt was back in March. She states that she does not need to make an appt yet to see clinical pharmacist  What can we do to take care of you better?Patient states that she does not need anything at this time   Care Gaps: Colonoscopy-01/17/14 Diabetic Foot Exam-07/25/20 Mammogram-NA Ophthalmology-06/21/18 Dexa Scan - 01/18/21 Annual Well Visit - NA Micro albumin-07/25/20 Hemoglobin A1c- 10/04/21  Star Rating Drugs: Atorvastatin 40 mg-last fill 09/16/21 90 ds  Hannah Bryant  Clinical Pharmacist Assistant [phone]

## 2021-12-30 ENCOUNTER — Telehealth: Payer: Self-pay | Admitting: Internal Medicine

## 2021-12-30 NOTE — Telephone Encounter (Signed)
LVM for pt to rtn my call to re-schedule AWV with [facility].

## 2022-01-03 ENCOUNTER — Ambulatory Visit: Payer: Medicare Other

## 2022-01-05 ENCOUNTER — Encounter: Payer: Self-pay | Admitting: Internal Medicine

## 2022-01-05 NOTE — Patient Instructions (Addendum)
° ° ° °  Blood work was ordered.     Medications changes include :  none    Your prescription(s) have been sent to your pharmacy.      Return in about 6 months (around 07/07/2022) for follow up.

## 2022-01-05 NOTE — Progress Notes (Signed)
Subjective:    Patient ID: Hannah Bryant, female    DOB: 08-20-1946, 76 y.o.   MRN: [id number]  This visit occurred during the SARS-CoV-2 public health emergency.  Safety protocols were in place, including screening questions prior to the visit, additional usage of staff PPE, and extensive cleaning of exam room while observing appropriate contact time as indicated for disinfecting solutions.     HPI The patient is here for follow up of their chronic medical problems, including OP, htn, DM, hld  She follows with Dr Cruzita Lederer.   Due for colonoscopy-she does not think she wants to have that done.  Discussed history of precancerous polyps and risk of developing into cancer.  OP - she knows she needs to work on her balance.  She denies falls.  She knows she needs to exercise more and work on weight loss.   Still undergoing dental work.    Sores inside bottom lip, a couple of sores on tongue. Irritated by salt and acidic foods.   Right side and posterior neck/base of skull hurts.   Right side of neck feels swollen at times.  It hurts more when she lays down.  She has massaged the area and it has not helped.  She tried voltaren gel and it has helped.     Medications and allergies reviewed with patient and updated if appropriate.  Patient Active Problem List   Diagnosis Date Noted   Allergic rhinitis 07/25/2020   Nausea 10/22/2018   Dizziness 10/22/2018   Vitamin D insufficiency 09/27/2018   Epigastric pain 09/15/2018   Right rotator cuff tear 05/11/2017   Cyst of joint of shoulder 03/04/2017   Left sided abdominal pain 03/25/2016   Type 2 diabetes mellitus with hypoglycemia without coma, without long-term current use of insulin ([place]) 02/28/2016   Reactive hypoglycemia 02/28/2016   Retrocalcaneal bursitis 02/26/2016   Osteoporosis 01/09/2016   Essential hypertension, benign 01/09/2016   Achilles tendinosis 06/22/2015   Junctional nevus of left thigh 12/23/2014   Left  rotator cuff tear arthropathy 08/21/2014   Squamous cell skin cancer 10/06/2013   BENIGN POSITIONAL VERTIGO 08/27/2009   History of colonic polyps 05/09/2008   Gastroparesis 11/15/2007   DIVERTICULOSIS, COLON 11/15/2007   GASTROESOPHAGEAL REFLUX DISEASE, SEVERE 05/12/2007   Nonspecific abnormal electrocardiogram (ECG) (EKG) 05/12/2007   Hyperlipidemia 05/07/2007    Current Outpatient Medications on File Prior to Visit  Medication Sig Dispense Refill   acetaminophen (TYLENOL ARTHRITIS PAIN) 650 MG CR tablet Take 650 mg by mouth. 2 by mouth 2 times daily     aspirin 81 MG chewable tablet Chew 81 mg by mouth daily.     atorvastatin (LIPITOR) 40 MG tablet TAKE 1 TABLET BY MOUTH EVERY DAY 90 tablet 1   Blood Glucose Monitoring Suppl (FREESTYLE FREEDOM LITE) w/Device KIT Use as instructed to check blood sugar 4 times daily. 1 kit 0   Calcium-Vitamin D-Vitamin K (VIACTIV PO) Take by mouth. Reported on 02/28/2016     Continuous Blood Gluc Sensor (FREESTYLE LIBRE 14 DAY SENSOR) MISC APPLY 1 SENSOR EVERY 14 DAYS. CHANGE EVERY 2 WEEKS 2 each 5   Cyanocobalamin (B-12) 1000 MCG TABS Take 1,000 mcg by mouth.     esomeprazole (NEXIUM) 40 MG capsule Take 1 capsule (40 mg total) by mouth daily. --- Needs office visit before further refills    30 capsule 2   fexofenadine (ALLEGRA) 180 MG tablet Take 180 mg by mouth daily.     glucose blood (  FREESTYLE TEST STRIPS) test strip FreeStyle Precision Neo test strips.  Use as instructed to check blood sugar 1 time a day as a back up for the Freestyle Libre CGM 100 each 12   hydrochlorothiazide (MICROZIDE) 12.5 MG capsule TAKE 1 CAPSULE BY MOUTH EVERY DAY 90 capsule 1   hyoscyamine (LEVSIN SL) 0.125 MG SL tablet DISSOLVE 1-2 TABLETS UNDER TONGUE EVERY 4-6 HOURS AS NEEDED FOR ABDOMINAL PAIN AND CRAMPING 360 tablet 5   Lactobacillus (PROBIOTIC ACIDOPHILUS PO) Take by mouth.     Lancets MISC by Does not apply route. Lifespan ultra 2 test strips and lancets      montelukast (SINGULAIR) 10 MG tablet TAKE 1 TABLET BY MOUTH EVERY DAY AS NEEDED 90 tablet 1   ondansetron (ZOFRAN ODT) 4 MG disintegrating tablet Take 1 tablet (4 mg total) by mouth every 8 (eight) hours as needed for nausea or vomiting. 30 tablet 0   OVER THE COUNTER MEDICATION multistrain probiotic     VITAMIN D, CHOLECALCIFEROL, PO Take by mouth.     No current facility-administered medications on file prior to visit.    Past Medical History:  Diagnosis Date   Abnormal finding on EKG    non specific T changes; short PR   Arthritis    Bursitis    tronchanteric   Complication of anesthesia    Severe PONV   Diabetes mellitus without complication ([place])    Pt states she is pre-diabetic (12-06-13)   Diverticulosis    Gastroparesis    93 % [place] at 2 hrs   GERD (gastroesophageal reflux disease)    H/O dilation and curettage    Hernia    hiatal   Hiatal hernia    Hyperlipidemia    Hyperlipidemia    hypoglycemia    Tubulovillous adenoma polyp of colon 06/2005   Hyperplastic 2007    Past Surgical History:  Procedure Laterality Date   CARDIAC CATHETERIZATION  2005   < 30 % lesion   COLONOSCOPY W/ POLYPECTOMY  2006 ,2007 & 2015   negative 2009; Dr Fuller Plan   DILATATION & CURETTAGE/HYSTEROSCOPY WITH TRUECLEAR N/A 01/11/2014   Procedure: DILATATION & CURETTAGE/HYSTEROSCOPY WITH TRUCLEAR;  Surgeon: Anastasio Auerbach, MD;  Location: [facility];  Service: Gynecology;  Laterality: N/A;   ESOPHAGEAL MANOMETRY  03/22/15   Decreased peristalsis   Esophagram  03/29/15   Persistent narrowing at site of hiatal hernia; proximal dilation   G 3 P 3     HIATAL HERNIA REPAIR  2000   LEG SURGERY     Trauma LLE/pins and plates   myomectomy  1999   uterine fibroid   ROTATOR CUFF REPAIR Right 05/2017   TRIGGER FINGER RELEASE Right    TUBAL LIGATION     Upper endoscopy and esophageal dilation  02/20/15   Dr Roney Mans , [facility]    Social History   Socioeconomic History   Marital status: Married     Spouse name: Not on file   Number of children: 2   Years of education: Not on file   Highest education level: Not on file  Occupational History    Employer: UNEMPLOYED  Tobacco Use   Smoking status: Former    Types: Cigarettes    Quit date: 11/17/1980    Years since quitting: 41.1   Smokeless tobacco: Never   Tobacco comments:    smoked 1962-1982, up to 1 ppd  Vaping Use   Vaping Use: Never used  Substance and Sexual Activity   Alcohol use: Yes  Alcohol/week: 2.0 standard drinks    Types: 2 Glasses of wine per week    Comment: socially,2 / week   Drug use: No   Sexual activity: Yes    Birth control/protection: Post-menopausal  Other Topics Concern   Not on file  Social History Narrative   Daily caffeine use   Married   Veterinary surgeon intermittently   Social Determinants of Health   Financial Resource Strain: Low Risk    Difficulty of Paying Living Expenses: Not hard at all  Food Insecurity: No Food Insecurity   Worried About Charity fundraiser in the Last Year: Never true   Arboriculturist in the Last Year: Never true  Transportation Needs: No Transportation Needs   Lack of Transportation (Medical): No   Lack of Transportation (Non-Medical): No  Physical Activity: Sufficiently Active   Days of Exercise per Week: 5 days   Minutes of Exercise per Session: 30 min  Stress: No Stress Concern Present   Feeling of Stress : Not at all  Social Connections: Not on file    Family History  Problem Relation Age of Onset   Ulcers Father    Diabetes Father    Hypertension Father    Heart attack Father        > 31   Stroke Father        in 2s   Heart attack Mother 64   Diabetes Sister    Heart attack Sister 39       CABG X 4   Cancer Neg Hx    Colitis Neg Hx     Review of Systems  Constitutional:  Negative for chills and fever.  Respiratory:  Positive for cough (allergy related). Negative for shortness of breath and wheezing.   Cardiovascular:   Negative for chest pain, palpitations and leg swelling.  Neurological:  Negative for light-headedness and headaches.      Objective:   Vitals:   01/07/22 0953  BP: 132/82  Pulse: 70  Temp: 98.4 F (36.9 C)  SpO2: 97%   BP Readings from Last 3 Encounters:  01/07/22 132/82  10/04/21 128/80  01/22/21 138/80   Wt Readings from Last 3 Encounters:  01/07/22 151 lb (68.5 kg)  10/04/21 154 lb 9.6 oz (70.1 kg)  01/22/21 149 lb 12.8 oz (67.9 kg)   Body mass index is 25.92 kg/m.   Physical Exam    Constitutional: Appears well-developed and well-nourished. No distress.  HENT:  Head: Normocephalic and atraumatic.  Neck: Neck supple. No tracheal deviation present. No thyromegaly present.  No cervical lymphadenopathy Cardiovascular: Normal rate, regular rhythm and normal heart sounds.   No murmur heard. No carotid bruit .  No edema Pulmonary/Chest: Effort normal and breath sounds normal. No respiratory distress. No has no wheezes. No rales. Musculoskeletal: Tenderness with palpation right posterior lateral neck near base of skull.  No upper back tenderness or C-spine tenderness Skin: Skin is warm and dry. Not diaphoretic.  Psychiatric: Normal mood and affect. Behavior is normal.      Assessment & Plan:    See Problem List for Assessment and Plan of chronic medical problems.

## 2022-01-06 ENCOUNTER — Ambulatory Visit (INDEPENDENT_AMBULATORY_CARE_PROVIDER_SITE_OTHER): Payer: Medicare Other

## 2022-01-06 DIAGNOSIS — Z0000 Encounter for general adult medical examination without abnormal findings: Secondary | ICD-10-CM | POA: Diagnosis not present

## 2022-01-06 NOTE — Patient Instructions (Signed)
Ms. Funchess , Thank you for taking time to come for your Medicare Wellness Visit. I appreciate your ongoing commitment to your health goals. Please review the following plan we discussed and let me know if I can assist you in the future.   Screening recommendations/referrals: Colonoscopy: last done 01/17/2014 Mammogram: 01/28/2021; due every year (scheduled for 02/03/2022) Bone Density: 01/18/2021; due every 3 years Recommended yearly ophthalmology/optometry visit for glaucoma screening and checkup Recommended yearly dental visit for hygiene and checkup  Vaccinations: Influenza vaccine: 08/2021 at [facility] Pneumococcal vaccine: 11/24/2013, 01/09/2016 Tdap vaccine: 10/17/2010; due every 10 years Shingles vaccine: no record; will check with [facility]   Covid-19: 11/26/2019, 12/17/2019, 08/04/2020, 02/01/2021, 08/14/2021  Advanced directives: Please bring a copy of your health care power of attorney and living will to the office at your convenience.  Conditions/risks identified: Yes; Client understands the importance of follow-up with providers by attending scheduled visits and discussed goals to eat healthier, increase physical activity, exercise the brain, socialize more, get enough sleep and make time for laughter.  Next appointment: Please schedule your next Medicare Wellness Visit with your Nurse Health Advisor in 1 year by calling [phone].   Preventive Care 22 Years and Older, Female Preventive care refers to lifestyle choices and visits with your health care provider that can promote health and wellness. What does preventive care include? A yearly physical exam. This is also called an annual well check. Dental exams once or twice a year. Routine eye exams. Ask your health care provider how often you should have your eyes checked. Personal lifestyle choices, including: Daily care of your teeth and gums. Regular physical activity. Eating a healthy diet. Avoiding tobacco and drug use. Limiting alcohol  use. Practicing safe sex. Taking low-dose aspirin every day. Taking vitamin and mineral supplements as recommended by your health care provider. What happens during an annual well check? The services and screenings done by your health care provider during your annual well check will depend on your age, overall health, lifestyle risk factors, and family history of disease. Counseling  Your health care provider may ask you questions about your: Alcohol use. Tobacco use. Drug use. Emotional well-being. Home and relationship well-being. Sexual activity. Eating habits. History of falls. Memory and ability to understand (cognition). Work and work Statistician. Reproductive health. Screening  You may have the following tests or measurements: Height, weight, and BMI. Blood pressure. Lipid and cholesterol levels. These may be checked every 5 years, or more frequently if you are over 33 years old. Skin check. Lung cancer screening. You may have this screening every year starting at age 69 if you have a 30-pack-year history of smoking and currently smoke or have quit within the past 15 years. Fecal occult blood test (FOBT) of the stool. You may have this test every year starting at age 56. Flexible sigmoidoscopy or colonoscopy. You may have a sigmoidoscopy every 5 years or a colonoscopy every 10 years starting at age 25. Hepatitis C blood test. Hepatitis B blood test. Sexually transmitted disease (STD) testing. Diabetes screening. This is done by checking your blood sugar (glucose) after you have not eaten for a while (fasting). You may have this done every 1-3 years. Bone density scan. This is done to screen for osteoporosis. You may have this done starting at age 37. Mammogram. This may be done every 1-2 years. Talk to your health care provider about how often you should have regular mammograms. Talk with your health care provider about your test results, treatment  options, and if necessary,  the need for more tests. Vaccines  Your health care provider may recommend certain vaccines, such as: Influenza vaccine. This is recommended every year. Tetanus, diphtheria, and acellular pertussis (Tdap, Td) vaccine. You may need a Td booster every 10 years. Zoster vaccine. You may need this after age 21. Pneumococcal 13-valent conjugate (PCV13) vaccine. One dose is recommended after age 21. Pneumococcal polysaccharide (PPSV23) vaccine. One dose is recommended after age 35. Talk to your health care provider about which screenings and vaccines you need and how often you need them. This information is not intended to replace advice given to you by your health care provider. Make sure you discuss any questions you have with your health care provider. Document Released: 11/30/2015 Document Revised: 07/23/2016 Document Reviewed: 09/04/2015 Elsevier Interactive Patient Education  2017 [place] Prevention in the Home Falls can cause injuries. They can happen to people of all ages. There are many things you can do to make your home safe and to help prevent falls. What can I do on the outside of my home? Regularly fix the edges of walkways and driveways and fix any cracks. Remove anything that might make you trip as you walk through a door, such as a raised step or threshold. Trim any bushes or trees on the path to your home. Use bright outdoor lighting. Clear any walking paths of anything that might make someone trip, such as rocks or tools. Regularly check to see if handrails are loose or broken. Make sure that both sides of any steps have handrails. Any raised decks and porches should have guardrails on the edges. Have any leaves, snow, or ice cleared regularly. Use sand or salt on walking paths during winter. Clean up any spills in your garage right away. This includes oil or grease spills. What can I do in the bathroom? Use night lights. Install grab bars by the toilet and in the  tub and shower. Do not use towel bars as grab bars. Use non-skid mats or decals in the tub or shower. If you need to sit down in the shower, use a plastic, non-slip stool. Keep the floor dry. Clean up any water that spills on the floor as soon as it happens. Remove soap buildup in the tub or shower regularly. Attach bath mats securely with double-sided non-slip rug tape. Do not have throw rugs and other things on the floor that can make you trip. What can I do in the bedroom? Use night lights. Make sure that you have a light by your bed that is easy to reach. Do not use any sheets or blankets that are too big for your bed. They should not hang down onto the floor. Have a firm chair that has side arms. You can use this for support while you get dressed. Do not have throw rugs and other things on the floor that can make you trip. What can I do in the kitchen? Clean up any spills right away. Avoid walking on wet floors. Keep items that you use a lot in easy-to-reach places. If you need to reach something above you, use a strong step stool that has a grab bar. Keep electrical cords out of the way. Do not use floor polish or wax that makes floors slippery. If you must use wax, use non-skid floor wax. Do not have throw rugs and other things on the floor that can make you trip. What can I do with my stairs? Do not  leave any items on the stairs. Make sure that there are handrails on both sides of the stairs and use them. Fix handrails that are broken or loose. Make sure that handrails are as long as the stairways. Check any carpeting to make sure that it is firmly attached to the stairs. Fix any carpet that is loose or worn. Avoid having throw rugs at the top or bottom of the stairs. If you do have throw rugs, attach them to the floor with carpet tape. Make sure that you have a light switch at the top of the stairs and the bottom of the stairs. If you do not have them, ask someone to add them for  you. What else can I do to help prevent falls? Wear shoes that: Do not have high heels. Have rubber bottoms. Are comfortable and fit you well. Are closed at the toe. Do not wear sandals. If you use a stepladder: Make sure that it is fully opened. Do not climb a closed stepladder. Make sure that both sides of the stepladder are locked into place. Ask someone to hold it for you, if possible. Clearly mark and make sure that you can see: Any grab bars or handrails. First and last steps. Where the edge of each step is. Use tools that help you move around (mobility aids) if they are needed. These include: Canes. Walkers. Scooters. Crutches. Turn on the lights when you go into a dark area. Replace any light bulbs as soon as they burn out. Set up your furniture so you have a clear path. Avoid moving your furniture around. If any of your floors are uneven, fix them. If there are any pets around you, be aware of where they are. Review your medicines with your doctor. Some medicines can make you feel dizzy. This can increase your chance of falling. Ask your doctor what other things that you can do to help prevent falls. This information is not intended to replace advice given to you by your health care provider. Make sure you discuss any questions you have with your health care provider. Document Released: 08/30/2009 Document Revised: 04/10/2016 Document Reviewed: 12/08/2014 Elsevier Interactive Patient Education  2017 [employer].

## 2022-01-06 NOTE — Progress Notes (Signed)
I connected with Hannah Bryant today by telephone and verified that I am speaking with the correct person using two identifiers. Location patient: home Location provider: work Persons participating in the virtual visit: patient, provider.   I discussed the limitations, risks, security and privacy concerns of performing an evaluation and management service by telephone and the availability of in person appointments. I also discussed with the patient that there may be a patient responsible charge related to this service. The patient expressed understanding and verbally consented to this telephonic visit.    Interactive audio and video telecommunications were attempted between this provider and patient, however failed, due to patient having technical difficulties OR patient did not have access to video capability.  We continued and completed visit with audio only.  Some vital signs may be absent or patient reported.   Time Spent with patient on telephone encounter: 40 minutes  Subjective:   Hannah Bryant is a 76 y.o. female who presents for Medicare Annual (Subsequent) preventive examination.  Review of Systems     Cardiac Risk Factors include: advanced age (>28mn, >>25women);diabetes mellitus;family history of premature cardiovascular disease;dyslipidemia     Objective:    There were no vitals filed for this visit. There is no height or weight on file to calculate BMI.  Advanced Directives 01/06/2022 11/25/2018 11/23/2014 01/06/2014  Does Patient Have a Medical Advance Directive? Yes Yes Yes Patient has advance directive, copy not in chart  Type of Advance Directive HRobelineLiving will - Living will;Healthcare Power of Attorney Living will  Does patient want to make changes to medical advance directive? No - Patient declined - - -  Copy of HG[place]boroin Chart? No - copy requested - - -    Current Medications (verified) Outpatient Encounter  Medications as of 01/06/2022  Medication Sig   acetaminophen (TYLENOL ARTHRITIS PAIN) 650 MG CR tablet Take 650 mg by mouth. 2 by mouth 2 times daily   aspirin 81 MG chewable tablet Chew 81 mg by mouth daily.   atorvastatin (LIPITOR) 40 MG tablet TAKE 1 TABLET BY MOUTH EVERY DAY   Blood Glucose Monitoring Suppl (FREESTYLE FREEDOM LITE) w/Device KIT Use as instructed to check blood sugar 4 times daily.   Calcium-Vitamin D-Vitamin K (VIACTIV PO) Take by mouth. Reported on 02/28/2016   Continuous Blood Gluc Sensor (FREESTYLE LIBRE 14 DAY SENSOR) MISC APPLY 1 SENSOR EVERY 14 DAYS. CHANGE EVERY 2 WEEKS   Cyanocobalamin (B-12) 1000 MCG TABS Take 1,000 mcg by mouth.   esomeprazole (NEXIUM) 40 MG capsule Take 1 capsule (40 mg total) by mouth daily. --- Needs office visit before further refills      fexofenadine (ALLEGRA) 180 MG tablet Take 180 mg by mouth daily.   glucose blood (FREESTYLE TEST STRIPS) test strip FreeStyle Precision Neo test strips.  Use as instructed to check blood sugar 1 time a day as a back up for the Freestyle Libre CGM   hydrochlorothiazide (MICROZIDE) 12.5 MG capsule TAKE 1 CAPSULE BY MOUTH EVERY DAY   hyoscyamine (LEVSIN SL) 0.125 MG SL tablet DISSOLVE 1-2 TABLETS UNDER TONGUE EVERY 4-6 HOURS AS NEEDED FOR ABDOMINAL PAIN AND CRAMPING   Lactobacillus (PROBIOTIC ACIDOPHILUS PO) Take by mouth.   Lancets MISC by Does not apply route. Lifespan ultra 2 test strips and lancets   montelukast (SINGULAIR) 10 MG tablet TAKE 1 TABLET BY MOUTH EVERY DAY AS NEEDED   ondansetron (ZOFRAN ODT) 4 MG disintegrating tablet Take 1 tablet (4 mg  total) by mouth every 8 (eight) hours as needed for nausea or vomiting.   OVER THE COUNTER MEDICATION multistrain probiotic   VITAMIN D, CHOLECALCIFEROL, PO Take by mouth.   No facility-administered encounter medications on file as of 01/06/2022.    Allergies (verified) Domperidone, Fentanyl, Monistat [miconazole nitrate], Nsaids, Oxycodone hcl, Codeine, and  Erythromycin ethylsuccinate   History: Past Medical History:  Diagnosis Date   Abnormal finding on EKG    non specific T changes; short PR   Arthritis    Bursitis    tronchanteric   Complication of anesthesia    Severe PONV   Diabetes mellitus without complication (HCC)    Pt states she is pre-diabetic (12-06-13)   Diverticulosis    Gastroparesis    93 % retenton at 2 hrs   GERD (gastroesophageal reflux disease)    H/O dilation and curettage    Hernia    hiatal   Hiatal hernia    Hyperlipidemia    Hyperlipidemia    hypoglycemia    Tubulovillous adenoma polyp of colon 06/2005   Hyperplastic 2007   Past Surgical History:  Procedure Laterality Date   CARDIAC CATHETERIZATION  2005   < 30 % lesion   COLONOSCOPY W/ POLYPECTOMY  2006 ,2007 & 2015   negative 2009; Dr Fuller Plan   DILATATION & CURETTAGE/HYSTEROSCOPY WITH TRUECLEAR N/A 01/11/2014   Procedure: DILATATION & CURETTAGE/HYSTEROSCOPY WITH TRUCLEAR;  Surgeon: Anastasio Auerbach, MD;  Location: [facility];  Service: Gynecology;  Laterality: N/A;   ESOPHAGEAL MANOMETRY  03/22/15   Decreased peristalsis   Esophagram  03/29/15   Persistent narrowing at site of hiatal hernia; proximal dilation   G 3 P 3     HIATAL HERNIA REPAIR  2000   LEG SURGERY     Trauma LLE/pins and plates   myomectomy  1999   uterine fibroid   ROTATOR CUFF REPAIR Right 05/2017   TRIGGER FINGER RELEASE Right    TUBAL LIGATION     Upper endoscopy and esophageal dilation  02/20/15   Dr Roney Mans , [facility]   Family History  Problem Relation Age of Onset   Ulcers Father    Diabetes Father    Hypertension Father    Heart attack Father        > 40   Stroke Father        in 86s   Heart attack Mother 7   Diabetes Sister    Heart attack Sister 32       CABG X 4   Cancer Neg Hx    Colitis Neg Hx    Social History   Socioeconomic History   Marital status: Married    Spouse name: Not on file   Number of children: 2   Years of education: Not on file   Highest  education level: Not on file  Occupational History    Employer: UNEMPLOYED  Tobacco Use   Smoking status: Former    Types: Cigarettes    Quit date: 11/17/1980    Years since quitting: 41.1   Smokeless tobacco: Never   Tobacco comments:    smoked 1962-1982, up to 1 ppd  Vaping Use   Vaping Use: Never used  Substance and Sexual Activity   Alcohol use: Yes    Alcohol/week: 2.0 standard drinks    Types: 2 Glasses of wine per week    Comment: socially,2 / week   Drug use: No   Sexual activity: Yes    Birth control/protection: Post-menopausal  Other  Topics Concern   Not on file  Social History Narrative   Daily caffeine use   Married   Veterinary surgeon intermittently   Social Determinants of Health   Financial Resource Strain: Low Risk    Difficulty of Paying Living Expenses: Not hard at all  Food Insecurity: No Food Insecurity   Worried About Charity fundraiser in the Last Year: Never true   Arboriculturist in the Last Year: Never true  Transportation Needs: No Transportation Needs   Lack of Transportation (Medical): No   Lack of Transportation (Non-Medical): No  Physical Activity: Sufficiently Active   Days of Exercise per Week: 5 days   Minutes of Exercise per Session: 30 min  Stress: No Stress Concern Present   Feeling of Stress : Not at all  Social Connections: Not on file    Tobacco Counseling Counseling given: Not Answered Tobacco comments: smoked 1962-1982, up to 1 ppd   Clinical Intake:  Pre-visit preparation completed: No  Pain : No/denies pain     Nutritional Risks: None Diabetes: Yes CBG done?: No Did pt. bring in CBG monitor from home?: No  How often do you need to have someone help you when you read instructions, pamphlets, or other written materials from your doctor or pharmacy?: 1 - Never What is the last grade level you completed in school?: Bachelor's Degree  Diabetic? yes  Interpreter Needed?: No  Information entered by ::  Lisette Abu, LPN   Activities of Daily Living In your present state of health, do you have any difficulty performing the following activities: 01/06/2022  Hearing? N  Vision? N  Difficulty concentrating or making decisions? N  Walking or climbing stairs? N  Dressing or bathing? N  Doing errands, shopping? N  Preparing Food and eating ? N  Using the Toilet? N  In the past six months, have you accidently leaked urine? N  Do you have problems with loss of bowel control? N  Managing your Medications? N  Managing your Finances? N  Housekeeping or managing your Housekeeping? N  Some recent data might be hidden    Patient Care Team: Binnie Rail, MD as PCP - General (Internal Medicine) Charlton Haws, [facility] as Pharmacist (Pharmacist)  Indicate any recent Medical Services you may have received from other than [facility] providers in the past year (date may be approximate).     Assessment:   This is a routine wellness examination for Erynn.  Hearing/Vision screen Hearing Screening - Comments:: Patient denied any hearing difficulty.   No hearing aids.  Vision Screening - Comments:: Patient wears reading glasses. Eye exam done annually by: Gala Romney, MD at [place] issues and exercise activities discussed: Current Exercise Habits: Structured exercise class (Stretch Zone), Type of exercise: Other - see comments, Time (Minutes): 30, Frequency (Times/Week): 5, Weekly Exercise (Minutes/Week): 150, Intensity: Moderate, Exercise limited by: None identified   Goals Addressed   None   Depression Screen PHQ 2/9 Scores 01/06/2022 07/25/2020 11/26/2018 11/25/2018 11/06/2016 10/01/2015 02/15/2015  PHQ - 2 Score 0 0 0 0 0 0 0    Fall Risk Fall Risk  01/06/2022 07/25/2020 11/25/2018 11/06/2016 10/01/2015  Falls in the past year? 0 0 0 No No  Number falls in past yr: 0 0 - - -  Comment - - - - -  Injury with Fall? 0 0 - - -  Risk for fall due to : No Fall Risks - - - -  Follow up Falls evaluation completed - - - -    FALL RISK PREVENTION PERTAINING TO THE HOME:  Any stairs in or around the home? Yes  If so, are there any without handrails? No  Home free of loose throw rugs in walkways, pet beds, electrical cords, etc? Yes  Adequate lighting in your home to reduce risk of falls? Yes   ASSISTIVE DEVICES UTILIZED TO PREVENT FALLS:  Life alert? No  Use of a cane, walker or w/c? No  Grab bars in the bathroom? Yes  Shower chair or bench in shower? Yes  Elevated toilet seat or a handicapped toilet? Yes   TIMED UP AND GO:  Was the test performed? No .  Length of time to ambulate 10 feet: n/a sec.   Gait steady and fast without use of assistive device  Cognitive Function:        Immunizations Immunization History  Administered Date(s) Administered   Influenza Whole 09/30/2007, 08/17/2008, 08/17/2012   Influenza, High Dose Seasonal PF 08/16/2015, 07/30/2016, 08/24/2017, 07/26/2019, 08/24/2020   Influenza-Unspecified 08/25/2013, 08/01/2014   PFIZER Comirnaty(Gray Top)Covid-19 Tri-Sucrose Vaccine 02/01/2021   PFIZER(Purple Top)SARS-COV-2 Vaccination 11/26/2019, 12/17/2019, 08/04/2020   Pneumococcal Conjugate-13 10/21/2007, 01/09/2016   Pneumococcal Polysaccharide-23 10/21/2007, 11/24/2013   Td 04/02/1999, 10/17/2010   Zoster, Live 11/30/2007    TDAP status: Due, Education has been provided regarding the importance of this vaccine. Advised may receive this vaccine at local pharmacy or Health Dept. Aware to provide a copy of the vaccination record if obtained from local pharmacy or Health Dept. Verbalized acceptance and understanding.  Flu Vaccine status: Up to date  Pneumococcal vaccine status: Up to date  Covid-19 vaccine status: Completed vaccines  Qualifies for Shingles Vaccine? Yes   Zostavax completed Yes   Shingrix Completed?: No.    Education has been provided regarding the importance of this vaccine. Patient has been advised to  call insurance company to determine out of pocket expense if they have not yet received this vaccine. Advised may also receive vaccine at local pharmacy or Health Dept. Verbalized acceptance and understanding.  Screening Tests Health Maintenance  Topic Date Due   Zoster Vaccines- Shingrix (1 of 2) Never done   COLONOSCOPY (Pts 45-75yr Insurance coverage will need to be confirmed)  01/17/2017   OPHTHALMOLOGY EXAM  06/22/2019   TETANUS/TDAP  10/17/2020   COVID-19 Vaccine (5 - Booster for Pfizer series) 03/29/2021   INFLUENZA VACCINE  06/17/2021   FOOT EXAM  07/25/2021   URINE MICROALBUMIN  07/25/2021   HEMOGLOBIN A1C  04/03/2022   DEXA SCAN  01/19/2024   Pneumonia Vaccine 76 Years old  Completed   Hepatitis C Screening  Completed   HPV VACCINES  Aged Out    Health Maintenance  Health Maintenance Due  Topic Date Due   Zoster Vaccines- Shingrix (1 of 2) Never done   COLONOSCOPY (Pts 45-43yrInsurance coverage will need to be confirmed)  01/17/2017   OPHTHALMOLOGY EXAM  06/22/2019   TETANUS/TDAP  10/17/2020   COVID-19 Vaccine (5 - Booster for Pfizer series) 03/29/2021   INFLUENZA VACCINE  06/17/2021   FOOT EXAM  07/25/2021   URINE MICROALBUMIN  07/25/2021    Colorectal cancer screening: No longer required.   Mammogram status: Completed 01/28/2021. Repeat every year  Bone Density status: Completed 01/18/2021. Results reflect: Bone density results: OSTEOPENIA. Repeat every 3 years.  Lung Cancer Screening: (Low Dose CT Chest recommended if Age 77-80ears, 30 pack-year currently smoking OR have quit w/in 15years.) does not  qualify.   Lung Cancer Screening Referral: no  Additional Screening:  Hepatitis C Screening: does qualify; Completed yes  Vision Screening: Recommended annual ophthalmology exams for early detection of glaucoma and other disorders of the eye. Is the patient up to date with their annual eye exam?  Yes  Who is the provider or what is the name of the office  in which the patient attends annual eye exams? Gala Romney, MD. If pt is not established with a provider, would they like to be referred to a provider to establish care? No .   Dental Screening: Recommended annual dental exams for proper oral hygiene  Community Resource Referral / Chronic Care Management: CRR required this visit?  No   CCM required this visit?  No      Plan:     I have personally reviewed and noted the following in the patients chart:   Medical and social history Use of alcohol, tobacco or illicit drugs  Current medications and supplements including opioid prescriptions.  Functional ability and status Nutritional status Physical activity Advanced directives List of other physicians Hospitalizations, surgeries, and ER visits in previous 12 months Vitals Screenings to include cognitive, depression, and falls Referrals and appointments  In addition, I have reviewed and discussed with patient certain preventive protocols, quality metrics, and best practice recommendations. A written personalized care plan for preventive services as well as general preventive health recommendations were provided to patient.     Sheral Flow, LPN   [id number]/33/9179   Nurse Notes:  Patient is cogitatively intact. There were no vitals filed for this visit. There is no height or weight on file to calculate BMI. Patient stated that she has no issues with gait or balance; does not use any assistive devices. Medications reviewed with patient; no opioid use noted.

## 2022-01-07 ENCOUNTER — Other Ambulatory Visit: Payer: Self-pay

## 2022-01-07 ENCOUNTER — Ambulatory Visit (INDEPENDENT_AMBULATORY_CARE_PROVIDER_SITE_OTHER): Payer: Medicare Other | Admitting: Internal Medicine

## 2022-01-07 VITALS — BP 132/82 | HR 70 | Temp 98.4°F | Ht 64.0 in | Wt 151.0 lb

## 2022-01-07 DIAGNOSIS — E7849 Other hyperlipidemia: Secondary | ICD-10-CM

## 2022-01-07 DIAGNOSIS — I10 Essential (primary) hypertension: Secondary | ICD-10-CM | POA: Diagnosis not present

## 2022-01-07 DIAGNOSIS — J309 Allergic rhinitis, unspecified: Secondary | ICD-10-CM | POA: Diagnosis not present

## 2022-01-07 DIAGNOSIS — M810 Age-related osteoporosis without current pathological fracture: Secondary | ICD-10-CM

## 2022-01-07 DIAGNOSIS — E559 Vitamin D deficiency, unspecified: Secondary | ICD-10-CM

## 2022-01-07 DIAGNOSIS — M542 Cervicalgia: Secondary | ICD-10-CM

## 2022-01-07 DIAGNOSIS — E11649 Type 2 diabetes mellitus with hypoglycemia without coma: Secondary | ICD-10-CM | POA: Diagnosis not present

## 2022-01-07 LAB — VITAMIN D 25 HYDROXY (VIT D DEFICIENCY, FRACTURES): VITD: 37.31 ng/mL (ref 30.00–100.00)

## 2022-01-07 LAB — MICROALBUMIN / CREATININE URINE RATIO
Creatinine,U: 130.4 mg/dL
Microalb Creat Ratio: 0.9 mg/g (ref 0.0–30.0)
Microalb, Ur: 1.2 mg/dL (ref 0.0–1.9)

## 2022-01-07 LAB — CBC WITH DIFFERENTIAL/PLATELET
Basophils Absolute: 0 10*3/uL (ref 0.0–0.1)
Basophils Relative: 0.4 % (ref 0.0–3.0)
Eosinophils Absolute: 0.1 10*3/uL (ref 0.0–0.7)
Eosinophils Relative: 1.5 % (ref 0.0–5.0)
HCT: 40.9 % (ref 36.0–46.0)
Hemoglobin: 13.6 g/dL (ref 12.0–15.0)
Lymphocytes Relative: 40.7 % (ref 12.0–46.0)
Lymphs Abs: 2.7 10*3/uL (ref 0.7–4.0)
MCHC: 33.4 g/dL (ref 30.0–36.0)
MCV: 96.3 fl (ref 78.0–100.0)
Monocytes Absolute: 0.5 10*3/uL (ref 0.1–1.0)
Monocytes Relative: 7 % (ref 3.0–12.0)
Neutro Abs: 3.3 10*3/uL (ref 1.4–7.7)
Neutrophils Relative %: 50.4 % (ref 43.0–77.0)
Platelets: 159 10*3/uL (ref 150.0–400.0)
RBC: 4.24 Mil/uL (ref 3.87–5.11)
RDW: 12.8 % (ref 11.5–15.5)
WBC: 6.6 10*3/uL (ref 4.0–10.5)

## 2022-01-07 LAB — LIPID PANEL
Cholesterol: 165 mg/dL (ref 0–200)
HDL: 72.1 mg/dL (ref >39.00)
LDL Cholesterol: 66 mg/dL (ref 0–99)
NonHDL: 92.68
Total CHOL/HDL Ratio: 2
Triglycerides: 133 mg/dL (ref 0.0–149.0)
VLDL: 26.6 mg/dL (ref 0.0–40.0)

## 2022-01-07 LAB — COMPREHENSIVE METABOLIC PANEL
ALT: 17 U/L (ref 0–35)
AST: 21 U/L (ref 0–37)
Albumin: 4.5 g/dL (ref 3.5–5.2)
Alkaline Phosphatase: 64 U/L (ref 39–117)
BUN: 16 mg/dL (ref 6–23)
CO2: 36 mEq/L — ABNORMAL HIGH (ref 19–32)
Calcium: 9.6 mg/dL (ref 8.4–10.5)
Chloride: 99 mEq/L (ref 96–112)
Creatinine, Ser: 0.81 mg/dL (ref 0.40–1.20)
GFR: 70.16 mL/min (ref >60.00)
Glucose, Bld: 122 mg/dL — ABNORMAL HIGH (ref 70–99)
Potassium: 3.4 mEq/L — ABNORMAL LOW (ref 3.5–5.1)
Sodium: 140 mEq/L (ref 135–145)
Total Bilirubin: 0.6 mg/dL (ref 0.2–1.2)
Total Protein: 7 g/dL (ref 6.0–8.3)

## 2022-01-07 MED ORDER — ONDANSETRON 4 MG PO TBDP: 4.0000 mg | ORAL_TABLET | Freq: Three times a day (TID) | ORAL | 0 refills | Status: DC | PRN

## 2022-01-07 MED ORDER — GLUCOSE BLOOD VI STRP: ORAL_STRIP | 12 refills | Status: AC

## 2022-01-07 MED ORDER — FREESTYLE TEST VI STRP: ORAL_STRIP | 12 refills | Status: AC

## 2022-01-07 NOTE — Assessment & Plan Note (Signed)
Chronic Blood pressure well controlled CMP Continue HCTZ 12.5 mg daily 

## 2022-01-07 NOTE — Assessment & Plan Note (Signed)
Chronic Taking vitamin D daily Check vitamin D level  

## 2022-01-07 NOTE — Assessment & Plan Note (Signed)
Chronic Controlled, Stable Continue Allegra, Singulair 10 mg nightly

## 2022-01-07 NOTE — Assessment & Plan Note (Addendum)
acute Right posterior-lateral neck pain that is muscular in nature Can go for massage Advised heat or ice Continue Voltaren gel-she did try this once or twice and it did help-use consistently We will avoid muscle relaxers because of side effects Can consider physical therapy if no improvement

## 2022-01-07 NOTE — Assessment & Plan Note (Addendum)
Chronic Has osteopenia with high FRAX Would benefit from Prolia-she would like to start this.  Will have someone look into the coverage/co-pay again and get her back for her first injection Continue calcium, vitamin D, vitamin K Stressed regular exercise DEXA up-to-date-due 2024

## 2022-01-07 NOTE — Assessment & Plan Note (Addendum)
Chronic Diet controlled Management per Dr. Cruzita Lederer Check urine microalbumin

## 2022-01-07 NOTE — Assessment & Plan Note (Signed)
Chronic Regular exercise and healthy diet encouraged Check lipid panel  Continue atorvastatin 40 mg daily 

## 2022-01-09 ENCOUNTER — Telehealth: Payer: Self-pay

## 2022-01-09 NOTE — Telephone Encounter (Signed)
-----  Message from Binnie Rail, MD sent at 01/08/2022  8:00 PM EST ----- Regarding: FW: OP - prolia Call her to schedule - she is ok with the copay    ----- Message ----- From: Jasper Loser, CMA Sent: 01/08/2022   7:19 PM EST To: Binnie Rail, MD Subject: RE: OP - prolia                                Hi [name],  Benefits verification has been completed and pt is ready for scheduling:  Pt ready for scheduling on or after 12/03/21  Out-of-pocket cost due at time of visit: $226 (deductible)  Primary: Medicare Prolia co-insurance: 20% (approximately $276) Admin fee co-insurance: 20% (approximately $25)  Secondary: [employer] Prolia co-insurance: Covers Medicare Part B co-insurance Admin fee co-insurance: Covers Medicare Part B co-insurance  Deductible: $0 of $226 met, secondary does NOT cover deductible  Prior Auth: not required PA# Valid:   ## This summary of benefits is an estimation of the patient's out-of-pocket cost. Exact cost may vary based on individual plan coverage.    Let me know if you have any questions,   Brandy  ----- Message ----- From: Binnie Rail, MD Sent: 01/07/2022  10:29 AM EST To: Jasper Loser, CMA, Marcina Millard, CMA Subject: OP - prolia                                    Brandy  You looked into prolia for her in the past  - she is now ready to do it - can you recheck coverage - she is ok with a copay.   Thanks [employer]

## 2022-01-09 NOTE — Telephone Encounter (Signed)
Contacted patient today to schedule. She stated she had one more person to speak with (her dentist) and she would call back next week to schedule.

## 2022-01-10 LAB — HM DIABETES EYE EXAM

## 2022-01-16 NOTE — Telephone Encounter (Signed)
VOB re-submitted.  

## 2022-01-21 NOTE — Telephone Encounter (Signed)
Pt ready for scheduling on or after 01/16/22 ? ?Out-of-pocket cost due at time of visit: $103.07 (remaining deductible) ? ?Primary: Medicare ?Prolia co-insurance: 20% (approximately $276) ?Admin fee co-insurance: 20% (approximately $25) ? ?Secondary: [employer] ?Prolia co-insurance: Covers Medicare Part B co-insurance ?Admin fee co-insurance: Covers Medicare Part B co-insurance and deductible.  ? ?Deductible: $122.97 of $226 met (NOT covered by secondary) ? ?Prior Auth: not required ?PA# ?Valid:  ? ?** This summary of benefits is an estimation of the patient's out-of-pocket cost. Exact cost may vary based on individual plan coverage.  ? ?

## 2022-02-06 ENCOUNTER — Encounter: Payer: Self-pay | Admitting: Internal Medicine

## 2022-02-06 NOTE — Progress Notes (Signed)
Outside notes received. Information abstracted. Notes sent to scan.  

## 2022-02-11 DIAGNOSIS — U071 COVID-19: Secondary | ICD-10-CM | POA: Diagnosis not present

## 2022-02-11 NOTE — Telephone Encounter (Signed)
Called pt to schedule appt.  ? ?Pt states "someone keeps calling to schedule this appointment and I will call you when I'm ready."  ? ? ?

## 2022-03-04 ENCOUNTER — Encounter: Payer: Self-pay | Admitting: Internal Medicine

## 2022-03-04 NOTE — Telephone Encounter (Signed)
Need insurance verification. Will send to Josepha Pigg, [place] ?

## 2022-03-14 ENCOUNTER — Encounter: Payer: Self-pay | Admitting: Internal Medicine

## 2022-03-14 ENCOUNTER — Ambulatory Visit (INDEPENDENT_AMBULATORY_CARE_PROVIDER_SITE_OTHER): Payer: Medicare Other | Admitting: Internal Medicine

## 2022-03-14 VITALS — BP 140/92 | HR 63 | Ht 64.0 in | Wt 154.8 lb

## 2022-03-14 DIAGNOSIS — E161 Other hypoglycemia: Secondary | ICD-10-CM | POA: Diagnosis not present

## 2022-03-14 DIAGNOSIS — M810 Age-related osteoporosis without current pathological fracture: Secondary | ICD-10-CM | POA: Diagnosis not present

## 2022-03-14 DIAGNOSIS — E11649 Type 2 diabetes mellitus with hypoglycemia without coma: Secondary | ICD-10-CM

## 2022-03-14 DIAGNOSIS — E559 Vitamin D deficiency, unspecified: Secondary | ICD-10-CM | POA: Diagnosis not present

## 2022-03-14 LAB — POCT GLYCOSYLATED HEMOGLOBIN (HGB A1C): Hemoglobin A1C: 6.5 % — AB (ref 4.0–5.6)

## 2022-03-14 NOTE — Patient Instructions (Addendum)
Please call [name] and see if you can start Prolia. ? ?Please come back for a follow-up appointment in 6 months. ? ? ? ?

## 2022-03-14 NOTE — Progress Notes (Signed)
Patient ID: Hannah Bryant, female   DOB: May 08, 1946, 76 y.o.   MRN: [id number] ? ?This visit occurred during the SARS-CoV-2 public health emergency.  Safety protocols were in place, including screening questions prior to the visit, additional usage of staff PPE, and extensive cleaning of exam room while observing appropriate contact time as indicated for disinfecting solutions.  ? ?HPI: ?Hannah Bryant is a 76 y.o.-year-old female, presenting for f/u for DM2, dx in ~2009 (or earlier), diet-controlled, with complications (reactive hypoglycemia) and osteoporosis.  Previously seen by Dr. Chalmers Cater. Last 06/2016.  Last visit with me 6 months ago. ? ?Interim history: ?No falls or fractures since last visit.   ?No increased urination, blurry vision, nausea, chest pain. ?At last visit, she mentioned that she had more lows in the 40s in her sleep and the pm per her CGM >> checked by fingersticks: 50-60s.  She was taking candy/OJ. She did not feel different during the episodes and mentions that she may not feel them unless she scans the sensor.  At this visit, she mentions that she occasionally sees lower blood sugars in the afternoon, to the upper 50s and 60s.   ? ?Reactive hypoglycemia: ? ?Reviewed history: ?Pt described that has had hypoglycemia every pm for "ages". Sometimes: sweating, feeling hot, nausea, sometimes vomiting.  She may need to take a nap afterward. ? ?She started to see Dr Roney Mans ([place]) >> hypoglycemic episodes = better. She had an EGD, Ba swallow test >> she had a stricture at the site of her previous hiatal hernia with food retention in the Esophagus long after she eats (procedure: Spring 2016). She had Esophageal dilation. She also had a capsule endoscopy.  Her hypoglycemia improved after esophageal stretching. ? ?Lowest CBG were previously in the 45s but lately in the 60s.  She corrects these with OJ or honey sticks. Lunch is a sandwich/wrap + yoghurt at 1 pm and snack at 3 pm (cheese or  PB+apples or PB crackers), supper at 7:30 pm. ? ?DM2: ? ?HbA1c levels reviewed: ?Lab Results  ?Component Value Date  ? HGBA1C 6.4 (A) 10/04/2021  ? HGBA1C 6.4 (A) 01/22/2021  ? HGBA1C 6.4 (A) 07/19/2020  ? ?She has previously on diabetic medications but she stopped that she was not feeling well on them.  She would not want to restart. ? ?She checks her sugars more than 4 times a day with her freestyle libre CGM: ? ? ?Previously: ? ? ?Previously: ? ?Lowest sugar was: LO (CGM) >> 42 >> 50s >> 40s (CGM) >> 58. She has hypoglycemia awareness in the 80s. ?Highest sugar was 200s >> 300s >> 200s >> 200. ? ?Glucometer: 4: OneTouch Verio and Freestyle ? ?She saw nutrition in 2016. ? ?-No CKD, last BUN/creatinine:  ?Lab Results  ?Component Value Date  ? BUN 16 01/07/2022  ? CREATININE 0.81 01/07/2022  ? ?-+ HL; last set of lipids: ?Lab Results  ?Component Value Date  ? CHOL 165 01/07/2022  ? HDL 72.10 01/07/2022  ? [place] 66 01/07/2022  ? TRIG 133.0 01/07/2022  ? CHOLHDL 2 01/07/2022  ?On Lipitor. ? ?She has a history of a humerus fracture in 01/2017 (however, this was not seen on x-ray...). ?She has osteopenia on DXA but with her history of humerus fracture, she has clinical osteoporosis: ? ?Reviewed her previous DXA scan reports: ?01/18/2021 Lumbar spine L1- L3 (L4) Femoral neck (FN)  ?T-score  +1.2 RFN: -2.2 ?LFN: -2.2  ?Change in BMD from previous DXA test (%)  +  5.1%* -6.6%*  ?(*) statistically significant ? ?02/08/2017 Lumbar spine (L1-L4) Femoral neck (FN)  ?T-score 0.6 RFN:-2.0 ?LFN:-1.6  ?Change in BMD from previous DXA test (%) Down 2.0% Down 4.3%  ? ? ?02/03/2021 Lumbar spine L1- L3 (L4) Femoral neck (FN)  ?T-score  +1.2 RFN: -2.2 ?LFN: -2.2  ?Change in BMD from previous DXA test (%)  +5.1%* -6.6%*  ?(*) statistically significant ? ?I suggested Prolia at previous visits and we discussed about benefits and possible side effects.  She prefered to wait until her dental implants were completed to decide about this. Now  she finished dental work (sees Dr. Geanie Cooley). ? ?Vitamin D insufficiency: ? ?Reviewed vitamin D levels: ?Lab Results  ?Component Value Date  ? VD25OH 37.31 01/07/2022  ? VD25OH 37.68 03/20/2021  ? VD25OH 38.19 01/22/2021  ? VD25OH 31.7 07/19/2020  ? VD25OH 97.50 09/09/2019  ? VD25OH 33.78 09/27/2018  ? VD25OH 24.24 (L) 03/24/2018  ? VD25OH 28.68 (L) 03/04/2017  ? ?She was on 1000 units vitamin D daily >> stopped by PCP >> restarted 500 units daily. Now 1000 units. ? ?She visited [place] summer 2019. ? ?ROS: ?+ see HPI ? ?I reviewed pt's medications, allergies, PMH, social hx, family hx, and changes were documented in the history of present illness. Otherwise, unchanged from my initial visit note. ? ?History  ? ?Social History  ? Marital Status: Married  ?  Spouse Name: N/A  ? Number of Children: 2  ? ?Social History Main Topics  ? Smoking status: Former Smoker  ?  Types: Cigarettes  ?  Quit date: 11/17/1980  ? Smokeless tobacco: Never Used  ?   Comment: smoked 1962-1982, up to 1 ppd  ? Alcohol Use: 1.2 oz/week  ?  2 Glasses of wine per week  ?   Comment: socially,2 / week  ? Drug Use: No  ? Sexual Activity: Yes  ?  Birth Control/ Protection: Post-menopausal  ? ?Social History Narrative  ? Daily caffeine use  ? Married  ? Seeing Tonie Griffith intermittently  ? ?Past Medical History:  ?Diagnosis Date  ? Abnormal finding on EKG   ? non specific T changes; short PR  ? Arthritis   ? Bursitis   ? tronchanteric  ? Complication of anesthesia   ? Severe PONV  ? Diabetes mellitus without complication ([place])   ? Pt states she is pre-diabetic (12-06-13)  ? Diverticulosis   ? Gastroparesis   ? 93 % [place] at 2 hrs  ? GERD (gastroesophageal reflux disease)   ? H/O dilation and curettage   ? Hernia   ? hiatal  ? Hiatal hernia   ? Hyperlipidemia   ? Hyperlipidemia   ? hypoglycemia   ? Tubulovillous adenoma polyp of colon 06/2005  ? Hyperplastic 2007  ? ?Past Surgical History:  ?Procedure Laterality Date  ? CARDIAC  CATHETERIZATION  2005  ? < 30 % lesion  ? COLONOSCOPY W/ POLYPECTOMY  2006 ,2007 & 2015  ? negative 2009; Dr Fuller Plan  ? DILATATION & CURETTAGE/HYSTEROSCOPY WITH TRUECLEAR N/A 01/11/2014  ? Procedure: DILATATION & CURETTAGE/HYSTEROSCOPY WITH TRUCLEAR;  Surgeon: Anastasio Auerbach, MD;  Location: [facility];  Service: Gynecology;  Laterality: N/A;  ? ESOPHAGEAL MANOMETRY  03/22/15  ? Decreased peristalsis  ? Esophagram  03/29/15  ? Persistent narrowing at site of hiatal hernia; proximal dilation  ? G 3 P 3    ? HIATAL HERNIA REPAIR  2000  ? LEG SURGERY    ? Trauma LLE/pins and plates  ?  myomectomy  1999  ? uterine fibroid  ? ROTATOR CUFF REPAIR Right 05/2017  ? TRIGGER FINGER RELEASE Right   ? TUBAL LIGATION    ? Upper endoscopy and esophageal dilation  02/20/15  ? Dr Roney Mans , [facility]  ? ?Current Outpatient Medications on File Prior to Visit  ?Medication Sig Dispense Refill  ? acetaminophen (TYLENOL ARTHRITIS PAIN) 650 MG CR tablet Take 650 mg by mouth. 2 by mouth 2 times daily    ? aspirin 81 MG chewable tablet Chew 81 mg by mouth daily.    ? atorvastatin (LIPITOR) 40 MG tablet TAKE 1 TABLET BY MOUTH EVERY DAY 90 tablet 1  ? Blood Glucose Monitoring Suppl (FREESTYLE FREEDOM LITE) w/Device KIT Use as instructed to check blood sugar 4 times daily. 1 kit 0  ? Calcium-Vitamin D-Vitamin K (VIACTIV PO) Take by mouth. Reported on 02/28/2016    ? Continuous Blood Gluc Sensor (FREESTYLE LIBRE 14 DAY SENSOR) MISC APPLY 1 SENSOR EVERY 14 DAYS. CHANGE EVERY 2 WEEKS 2 each 5  ? Cyanocobalamin (B-12) 1000 MCG TABS Take 1,000 mcg by mouth.    ? esomeprazole (NEXIUM) 40 MG capsule Take 1 capsule (40 mg total) by mouth daily. --- Needs office visit before further refills  ?  30 capsule 2  ? fexofenadine (ALLEGRA) 180 MG tablet Take 180 mg by mouth daily.    ? glucose blood (FREESTYLE TEST STRIPS) test strip FreeStyle Precision Neo test strips.  Use as instructed to check blood sugar 1 time a day as a back up for the Freestyle Libre CGM 100 each 12  ?  glucose blood test strip Freestyle freedom test strip Use as instructed to check sugars E11.9 50 each 12  ? hydrochlorothiazide (MICROZIDE) 12.5 MG capsule TAKE 1 CAPSULE BY MOUTH EVERY DAY 90 capsule 1  ? hyoscyam

## 2022-03-17 ENCOUNTER — Telehealth: Payer: Self-pay | Admitting: Internal Medicine

## 2022-03-17 NOTE — Telephone Encounter (Signed)
Patient would like to know when she should get her prolia shot and if the medication is in?  Please advise,. ?

## 2022-03-17 NOTE — Telephone Encounter (Signed)
Patient coming in on Wednesday for first prolia. ?

## 2022-03-19 ENCOUNTER — Telehealth: Payer: Self-pay

## 2022-03-19 ENCOUNTER — Ambulatory Visit (INDEPENDENT_AMBULATORY_CARE_PROVIDER_SITE_OTHER): Payer: Medicare Other

## 2022-03-19 DIAGNOSIS — M810 Age-related osteoporosis without current pathological fracture: Secondary | ICD-10-CM | POA: Diagnosis not present

## 2022-03-19 MED ORDER — DENOSUMAB 60 MG/ML ~~LOC~~ SOSY
60.0000 mg | PREFILLED_SYRINGE | Freq: Once | SUBCUTANEOUS | Status: AC
  Administered 2022-03-19: 60 mg via SUBCUTANEOUS

## 2022-03-19 NOTE — Telephone Encounter (Signed)
Entered in error

## 2022-03-19 NOTE — Progress Notes (Signed)
After obtaining consent, and per orders of [name], injection of Prolia given by Max Sane. Patient tolerated injection well in left deltoid and was informed to report any adverse reaction to me immediately.  ?

## 2022-03-24 ENCOUNTER — Ambulatory Visit: Payer: Medicare Other | Admitting: Internal Medicine

## 2022-03-25 ENCOUNTER — Encounter: Payer: Self-pay | Admitting: Internal Medicine

## 2022-04-03 ENCOUNTER — Other Ambulatory Visit: Payer: Self-pay | Admitting: Internal Medicine

## 2022-04-05 DIAGNOSIS — Z23 Encounter for immunization: Secondary | ICD-10-CM | POA: Diagnosis not present

## 2022-04-09 ENCOUNTER — Other Ambulatory Visit: Payer: Self-pay | Admitting: Internal Medicine

## 2022-04-10 ENCOUNTER — Other Ambulatory Visit: Payer: Self-pay | Admitting: Internal Medicine

## 2022-04-17 ENCOUNTER — Telehealth: Payer: Self-pay

## 2022-04-17 NOTE — Chronic Care Management (AMB) (Unsigned)
Chronic Care Management Pharmacy Assistant   Name: Hannah Bryant  MRN: [id number] DOB: Apr 16, 1946  Reason for Encounter: Disease State-General    Recent office visits:  03/14/22 Philemon Kingdom, MD-Internal Medicine (Type 2 diabetes mellitus with hypoglycemia without coma, without long-term current use of insulin) Orders: POCT glycosylated hemoglobin (Hb A1C); Medication changes: none  01/07/22 Binnie Rail, MD-PCP (Essential hypertension, benign) Orders: CMP,CBC,Lipid panel, Microalbumin/creatinine urine ratio, Vitamin D 25  Recent consult visits:  None since last coordination call on 12/25/21  Hospital visits:  None since last coordination call on 12/25/21   Medications: Outpatient Encounter Medications as of 04/17/2022  Medication Sig   acetaminophen (TYLENOL ARTHRITIS PAIN) 650 MG CR tablet Take 650 mg by mouth. 2 by mouth 2 times daily   aspirin 81 MG chewable tablet Chew 81 mg by mouth daily.   atorvastatin (LIPITOR) 40 MG tablet TAKE 1 TABLET BY MOUTH EVERY DAY   Blood Glucose Monitoring Suppl (FREESTYLE FREEDOM LITE) w/Device KIT Use as instructed to check blood sugar 4 times daily.   Calcium-Vitamin D-Vitamin K (VIACTIV PO) Take by mouth. Reported on 02/28/2016   Continuous Blood Gluc Sensor (FREESTYLE LIBRE 14 DAY SENSOR) MISC APPLY 1 SENSOR EVERY 14 DAYS. CHANGE EVERY 2 WEEKS   Cyanocobalamin (B-12) 1000 MCG TABS Take 1,000 mcg by mouth.   esomeprazole (NEXIUM) 40 MG capsule Take 1 capsule (40 mg total) by mouth daily. --- Needs office visit before further refills      fexofenadine (ALLEGRA) 180 MG tablet Take 180 mg by mouth daily.   glucose blood (FREESTYLE TEST STRIPS) test strip FreeStyle Precision Neo test strips.  Use as instructed to check blood sugar 1 time a day as a back up for the Freestyle Libre CGM   glucose blood test strip Freestyle freedom test strip Use as instructed to check sugars E11.9   hydrochlorothiazide (MICROZIDE) 12.5 MG capsule TAKE 1 CAPSULE  BY MOUTH EVERY DAY   hyoscyamine (LEVSIN SL) 0.125 MG SL tablet DISSOLVE 1-2 TABLETS UNDER TONGUE EVERY 4-6 HOURS AS NEEDED FOR ABDOMINAL PAIN AND CRAMPING   Lactobacillus (PROBIOTIC ACIDOPHILUS PO) Take by mouth.   Lancets MISC by Does not apply route. Lifespan ultra 2 test strips and lancets   montelukast (SINGULAIR) 10 MG tablet TAKE 1 TABLET BY MOUTH EVERY DAY AS NEEDED   ondansetron (ZOFRAN ODT) 4 MG disintegrating tablet Take 1 tablet (4 mg total) by mouth every 8 (eight) hours as needed for nausea or vomiting.   OVER THE COUNTER MEDICATION multistrain probiotic   VITAMIN D, CHOLECALCIFEROL, PO Take by mouth.   No facility-administered encounter medications on file as of 04/17/2022.   [place] for General Review Call   Chart Review:  Have there been any documented new, changed, or discontinued medications since last visit? No (If yes, include name, dose, frequency, date) Has there been any documented recent hospitalizations or ED visits since last visit with Clinical Pharmacist? No Brief Summary (including medication and/or Diagnosis changes):   Adherence Review:  Does the Clinical Pharmacist Assistant have access to adherence rates? Yes Adherence rates for STAR metric medications (List medication(s)/day supply/ last 2 fill dates).Star List below Adherence rates for medications indicated for disease state being reviewed (List medication(s)/day supply/ last 2 fill dates). Does the patient have >5 day gap between last estimated fill dates for any of the above medications or other medication gaps? No Reason for medication gaps.   Disease State Questions:  Able to connect with Patient? {  yes/no:20286}  Did patient have any problems with their health recently? {yes/no:20286} Note problems and Concerns:  Have you had any admissions or emergency room visits or worsening of your condition(s) since last visit? {yes/no:20286} Details of ED visit, hospital visit  and/or worsening condition(s):  Have you had any visits with new specialists or providers since your last visit? {yes/no:20286} Explain:  Have you had any new health care problem(s) since your last visit? {yes/no:20286} New problem(s) reported:  Have you run out of any of your medications since you last spoke with clinical pharmacist? {yes/no:20286} What caused you to run out of your medications?  Are there any medications you are not taking as prescribed? {yes/no:20286} What kept you from taking your medications as prescribed?  Are you having any issues or side effects with your medications? {yes/no:20286} Note of issues or side effects:  Do you have any other health concerns or questions you want to discuss with your Clinical Pharmacist before your next visit? {yes/no:20286} Note additional concerns and questions from Patient.  Are there any health concerns that you feel we can do a better job addressing? {yes/no:20286} Note Patient's response.  Are you having any problems with any of the following since the last visit: (select all that apply)  {General Call:27390}  Details: 12. Any falls since last visit? {yes/no:20286}  Details:  13. Any increased or uncontrolled pain since last visit? {yes/no:20286}  Details:  14. Next visit Type: {Telephone/Office:25179}       Visit with:        Date:        Time:  4. Additional Details? {yes/no:20286}    Care Gaps: Colonoscopy-01/17/14 Diabetic Foot Exam-07/25/20 Mammogram-NA Ophthalmology-01/10/22 Dexa Scan - 01/18/22 Annual Well Visit - NA Micro albumin-01/07/22 Hemoglobin A1c- 03/14/22  Star Rating Drugs: Atorvastatin 40 mg-last fill   [place] Pharmacist Assistant [phone]

## 2022-06-09 ENCOUNTER — Encounter: Payer: Self-pay | Admitting: Internal Medicine

## 2022-06-10 ENCOUNTER — Encounter: Payer: Self-pay | Admitting: Internal Medicine

## 2022-06-10 MED ORDER — FLUCONAZOLE 150 MG PO TABS: 150.0000 mg | ORAL_TABLET | Freq: Once | ORAL | 0 refills | Status: DC

## 2022-06-11 MED ORDER — FLUCONAZOLE 150 MG PO TABS: 150.0000 mg | ORAL_TABLET | Freq: Once | ORAL | 0 refills | Status: AC

## 2022-06-17 ENCOUNTER — Other Ambulatory Visit: Payer: Self-pay | Admitting: Internal Medicine

## 2022-06-19 ENCOUNTER — Other Ambulatory Visit (HOSPITAL_COMMUNITY): Payer: Self-pay

## 2022-06-25 DIAGNOSIS — H43813 Vitreous degeneration, bilateral: Secondary | ICD-10-CM | POA: Diagnosis not present

## 2022-06-25 DIAGNOSIS — Z961 Presence of intraocular lens: Secondary | ICD-10-CM | POA: Diagnosis not present

## 2022-06-25 DIAGNOSIS — H04123 Dry eye syndrome of bilateral lacrimal glands: Secondary | ICD-10-CM | POA: Diagnosis not present

## 2022-06-25 DIAGNOSIS — H0102A Squamous blepharitis right eye, upper and lower eyelids: Secondary | ICD-10-CM | POA: Diagnosis not present

## 2022-07-02 NOTE — Patient Instructions (Addendum)
     Blood work was ordered.      Medications changes include :   None      Return in about 6 months (around 01/07/2023) for follow up.

## 2022-07-02 NOTE — Progress Notes (Signed)
    Subjective:    Patient ID: Hannah Bryant, female    DOB: 01/16/1946, 76 y.o.   MRN: [id number]     HPI Hannah Bryant is here for follow up of her chronic medical problems, including htn, hld, DM, OP, allergic rhinitis  On prolia - last injection 03/2022 - she denies side effects  She still has low sugars at times.    Medications and allergies reviewed with patient and updated if appropriate.  Current Outpatient Medications on File Prior to Visit  Medication Sig Dispense Refill   acetaminophen (TYLENOL ARTHRITIS PAIN) 650 MG CR tablet Take 650 mg by mouth. 2 by mouth 2 times daily     aspirin 81 MG chewable tablet Chew 81 mg by mouth daily.     atorvastatin (LIPITOR) 40 MG tablet TAKE 1 TABLET BY MOUTH EVERY DAY 90 tablet 1   Blood Glucose Monitoring Suppl (FREESTYLE FREEDOM LITE) w/Device KIT Use as instructed to check blood sugar 4 times daily. 1 kit 0   Calcium-Vitamin D-Vitamin K (VIACTIV PO) Take by mouth. Reported on 02/28/2016     Continuous Blood Gluc Sensor (FREESTYLE LIBRE 14 DAY SENSOR) MISC APPLY 1 SENSOR EVERY 14 DAYS. CHANGE EVERY 2 WEEKS 6 each 3   Cyanocobalamin (B-12) 1000 MCG TABS Take 1,000 mcg by mouth.     denosumab (PROLIA) 60 MG/ML SOSY injection Inject 60 mg into the skin every 6 (six) months.     esomeprazole (NEXIUM) 40 MG capsule Take 1 capsule (40 mg total) by mouth daily. --- Needs office visit before further refills    30 capsule 2   fexofenadine (ALLEGRA) 180 MG tablet Take 180 mg by mouth daily.     glucose blood (FREESTYLE TEST STRIPS) test strip FreeStyle Precision Neo test strips.  Use as instructed to check blood sugar 1 time a day as a back up for the Freestyle Libre CGM 100 each 12   glucose blood test strip Freestyle freedom test strip Use as instructed to check sugars E11.9 50 each 12   hydrochlorothiazide (MICROZIDE) 12.5 MG capsule TAKE 1 CAPSULE BY MOUTH EVERY DAY 90 capsule 1   hyoscyamine (LEVSIN SL) 0.125 MG SL tablet DISSOLVE 1-2  TABLETS UNDER TONGUE EVERY 4-6 HOURS AS NEEDED FOR ABDOMINAL PAIN AND CRAMPING 300 tablet 7   Lancets MISC by Does not apply route. Lifespan ultra 2 test strips and lancets     montelukast (SINGULAIR) 10 MG tablet TAKE 1 TABLET BY MOUTH EVERY DAY AS NEEDED 90 tablet 1   ondansetron (ZOFRAN ODT) 4 MG disintegrating tablet Take 1 tablet (4 mg total) by mouth every 8 (eight) hours as needed for nausea or vomiting. 30 tablet 0   OVER THE COUNTER MEDICATION multistrain probiotic     VITAMIN D, CHOLECALCIFEROL, PO Take by mouth.     No current facility-administered medications on file prior to visit.     Review of Systems  Constitutional:  Negative for fever.  Respiratory:  Negative for cough, shortness of breath and wheezing.   Cardiovascular:  Negative for chest pain, palpitations and leg swelling.  Musculoskeletal:  Positive for neck pain (right side - pain with laying down).  Neurological:  Positive for headaches (occ - allergy related). Negative for light-headedness.       Objective:   Vitals:   07/07/22 0828  BP: 126/78  Pulse: 61  Temp: 97.9 F (36.6 C)  SpO2: 97%   BP Readings from Last 3 Encounters:  07/07/22 126/78  03/14/22 (!)   140/92  01/07/22 132/82   Wt Readings from Last 3 Encounters:  07/07/22 149 lb (67.6 kg)  03/14/22 154 lb 12.8 oz (70.2 kg)  01/07/22 151 lb (68.5 kg)   Body mass index is 25.58 kg/m.    Physical Exam Constitutional:      General: She is not in acute distress.    Appearance: Normal appearance.  HENT:     Head: Normocephalic and atraumatic.  Eyes:     Conjunctiva/sclera: Conjunctivae normal.  Cardiovascular:     Rate and Rhythm: Normal rate and regular rhythm.     Heart sounds: Normal heart sounds. No murmur heard. Pulmonary:     Effort: Pulmonary effort is normal. No respiratory distress.     Breath sounds: Normal breath sounds. No wheezing.  Musculoskeletal:     Cervical back: Neck supple.     Right lower leg: No edema.      Left lower leg: No edema.  Lymphadenopathy:     Cervical: No cervical adenopathy.  Skin:    General: Skin is warm and dry.     Findings: No rash.  Neurological:     Mental Status: She is alert. Mental status is at baseline.  Psychiatric:        Mood and Affect: Mood normal.        Behavior: Behavior normal.        Lab Results  Component Value Date   WBC 6.6 01/07/2022   HGB 13.6 01/07/2022   HCT 40.9 01/07/2022   PLT 159.0 01/07/2022   GLUCOSE 122 (H) 01/07/2022   CHOL 165 01/07/2022   TRIG 133.0 01/07/2022   HDL 72.10 01/07/2022   LDLCALC 66 01/07/2022   ALT 17 01/07/2022   AST 21 01/07/2022   NA 140 01/07/2022   K 3.4 (L) 01/07/2022   CL 99 01/07/2022   CREATININE 0.81 01/07/2022   BUN 16 01/07/2022   CO2 36 (H) 01/07/2022   TSH 1.12 09/09/2019   HGBA1C 6.5 (A) 03/14/2022   MICROALBUR 1.2 01/07/2022     Assessment & Plan:    See Problem List for Assessment and Plan of chronic medical problems.

## 2022-07-07 ENCOUNTER — Encounter: Payer: Self-pay | Admitting: Internal Medicine

## 2022-07-07 ENCOUNTER — Ambulatory Visit (INDEPENDENT_AMBULATORY_CARE_PROVIDER_SITE_OTHER): Payer: Medicare Other | Admitting: Internal Medicine

## 2022-07-07 VITALS — BP 126/78 | HR 61 | Temp 97.9°F | Ht 64.0 in | Wt 149.0 lb

## 2022-07-07 DIAGNOSIS — E11649 Type 2 diabetes mellitus with hypoglycemia without coma: Secondary | ICD-10-CM

## 2022-07-07 DIAGNOSIS — I10 Essential (primary) hypertension: Secondary | ICD-10-CM

## 2022-07-07 DIAGNOSIS — M810 Age-related osteoporosis without current pathological fracture: Secondary | ICD-10-CM

## 2022-07-07 DIAGNOSIS — E559 Vitamin D deficiency, unspecified: Secondary | ICD-10-CM | POA: Diagnosis not present

## 2022-07-07 DIAGNOSIS — J309 Allergic rhinitis, unspecified: Secondary | ICD-10-CM | POA: Diagnosis not present

## 2022-07-07 DIAGNOSIS — E7849 Other hyperlipidemia: Secondary | ICD-10-CM

## 2022-07-07 DIAGNOSIS — K219 Gastro-esophageal reflux disease without esophagitis: Secondary | ICD-10-CM | POA: Diagnosis not present

## 2022-07-07 NOTE — Assessment & Plan Note (Signed)
Chronic Taking vitamin D daily Check vitamin D level  

## 2022-07-07 NOTE — Assessment & Plan Note (Signed)
Chronic DEXA up-to-date Continue Prolia injection to 6 months-last injection 03/2022 Check CMP, vitamin D level Stressed continuing good calcium, vitamin D intake daily Stressed regular exercise-weightbearing, ideally weights

## 2022-07-07 NOTE — Assessment & Plan Note (Signed)
Chronic Blood pressure well controlled CMP Continue HCTZ 12.5 mg daily

## 2022-07-07 NOTE — Assessment & Plan Note (Signed)
Chronic GERD controlled Continue nexium otc 40 mg daily

## 2022-07-07 NOTE — Assessment & Plan Note (Signed)
Chronic Regular exercise and healthy diet encouraged Check lipid panel, CMP Continue atorvastatin 40 mg daily 

## 2022-07-07 NOTE — Assessment & Plan Note (Signed)
Chronic Controlled and stable Continue Allegra, Singulair 10 mg nightly

## 2022-07-07 NOTE — Assessment & Plan Note (Signed)
Chronic Management per Dr. Cruzita Lederer Diet controlled

## 2022-07-10 NOTE — Telephone Encounter (Signed)
Last Prolia inj 03/19/22 Next Prolia inj due 09/20/22

## 2022-07-30 DIAGNOSIS — Z23 Encounter for immunization: Secondary | ICD-10-CM | POA: Diagnosis not present

## 2022-08-11 DIAGNOSIS — Z23 Encounter for immunization: Secondary | ICD-10-CM | POA: Diagnosis not present

## 2022-08-23 NOTE — Telephone Encounter (Signed)
Prolia VOB initiated via [email or web address]  Last Prolia inj 03/19/22 Next Prolia inj due 09/20/22

## 2022-09-16 ENCOUNTER — Other Ambulatory Visit: Payer: Self-pay | Admitting: Internal Medicine

## 2022-09-16 ENCOUNTER — Ambulatory Visit (INDEPENDENT_AMBULATORY_CARE_PROVIDER_SITE_OTHER): Payer: Medicare Other | Admitting: Internal Medicine

## 2022-09-16 ENCOUNTER — Encounter: Payer: Self-pay | Admitting: Internal Medicine

## 2022-09-16 ENCOUNTER — Other Ambulatory Visit (HOSPITAL_COMMUNITY): Payer: Self-pay

## 2022-09-16 VITALS — BP 120/72 | HR 63 | Ht 64.0 in | Wt 152.2 lb

## 2022-09-16 DIAGNOSIS — E11649 Type 2 diabetes mellitus with hypoglycemia without coma: Secondary | ICD-10-CM

## 2022-09-16 DIAGNOSIS — E559 Vitamin D deficiency, unspecified: Secondary | ICD-10-CM | POA: Diagnosis not present

## 2022-09-16 DIAGNOSIS — M810 Age-related osteoporosis without current pathological fracture: Secondary | ICD-10-CM | POA: Diagnosis not present

## 2022-09-16 DIAGNOSIS — E161 Other hypoglycemia: Secondary | ICD-10-CM | POA: Diagnosis not present

## 2022-09-16 LAB — POCT GLYCOSYLATED HEMOGLOBIN (HGB A1C): Hemoglobin A1C: 6.5 % — AB (ref 4.0–5.6)

## 2022-09-16 MED ORDER — FREESTYLE LIBRE 2 READER DEVI: 1.0000 | Freq: Every day | 3 refills | Status: DC

## 2022-09-16 MED ORDER — FREESTYLE LIBRE 2 SENSOR MISC: 1.0000 | 3 refills | Status: DC

## 2022-09-16 NOTE — Progress Notes (Signed)
Patient ID: Hannah Bryant, female   DOB: 09-24-1946, 76 y.o.   MRN: [id number]   HPI: Hannah Bryant is a 76 y.o.-year-old female, presenting for f/u for DM2, dx in ~2009 (or earlier), diet-controlled, with complications (reactive hypoglycemia) and osteoporosis.  Previously seen by Dr. Chalmers Cater. Last 06/2016.  Last visit with me 6 months ago.  Interim history: No falls or fractures since last visit.   No increased urination, blurry vision, nausea, chest pain. She still occasionally sees lower blood sugars in the afternoon, in the 40's-60's. She started Prolia 03/2022.  She tolerates it well.  Reactive hypoglycemia:  Reviewed history: Pt described that has had hypoglycemia every pm for "ages". Sometimes: sweating, feeling hot, nausea, sometimes vomiting.  She may need to take a nap afterward.  She started to see Dr Roney Mans ([place]) >> hypoglycemic episodes = better. She had an EGD, Ba swallow test >> she had a stricture at the site of her previous hiatal hernia with food retention in the Esophagus long after she eats (procedure: Spring 2016). She had Esophageal dilation. She also had a capsule endoscopy.  Her hypoglycemia improved after esophageal stretching.  Lowest CBG are in the 50s but lately in the 60s.  She corrects these with OJ or honey sticks. Lunch is a sandwich/wrap + yoghurt at 1 pm and snack at 3 pm (cheese or PB+apples or PB crackers), supper at 7:30 pm.  DM2:  HbA1c levels reviewed: Lab Results  Component Value Date   HGBA1C 6.5 (A) 03/14/2022   HGBA1C 6.4 (A) 10/04/2021   HGBA1C 6.4 (A) 01/22/2021   She has previously on diabetic medications but she stopped that she was not feeling well on them.  She would not want to restart.  She checks her sugars more than 4 times a day with her freestyle libre CGM:   Prev.:   Previously:   Lowest sugar was: 50s >> 40s (CGM) >> 58 >> 40. She has hypoglycemia awareness in the 80s. Highest sugar was 300s >> 200s >> 200  >> 262.  Glucometer: 4: OneTouch Verio and Freestyle  She saw nutrition in 2016.  -No CKD, last BUN/creatinine:  Lab Results  Component Value Date   BUN 16 01/07/2022   CREATININE 0.81 01/07/2022   -+ HL; last set of lipids: Lab Results  Component Value Date   CHOL 165 01/07/2022   HDL 72.10 01/07/2022   LDLCALC 66 01/07/2022   TRIG 133.0 01/07/2022   CHOLHDL 2 01/07/2022  On Lipitor.  She has a history of a humerus fracture in 01/2017 (however, this was not seen on x-ray...). She has osteopenia on DXA but with her history of humerus fracture, she has clinical osteoporosis:  Reviewed her previous DXA scan reports: 01/18/2021 Lumbar spine L1- L3 (L4) Femoral neck (FN)  T-score  +1.2 RFN: -2.2 LFN: -2.2  Change in BMD from previous DXA test (%)  +5.1%* -6.6%*  (*) statistically significant  02/08/2017 Lumbar spine (L1-L4) Femoral neck (FN)  T-score 0.6 RFN:-2.0 LFN:-1.6  Change in BMD from previous DXA test (%) Down 2.0% Down 4.3%    02/03/2021 Lumbar spine L1- L3 (L4) Femoral neck (FN)  T-score  +1.2 RFN: -2.2 LFN: -2.2  Change in BMD from previous DXA test (%)  +5.1%* -6.6%*  (*) statistically significant  I suggested Prolia at previous visits and we discussed about benefits and possible side effects.  She prefered to wait until her dental implants were completed to decide about this. Now she finished  dental work (sees Dr. Geanie Cooley).  She finally started this on 03/24/2022 in PCPs office.  She tolerates it well.  Vitamin D insufficiency:  Reviewed vitamin D levels: Lab Results  Component Value Date   VD25OH 37.31 01/07/2022   VD25OH 37.68 03/20/2021   VD25OH 38.19 01/22/2021   VD25OH 31.7 07/19/2020   VD25OH 97.50 09/09/2019   VD25OH 33.78 09/27/2018   VD25OH 24.24 (L) 03/24/2018   VD25OH 28.68 (L) 03/04/2017   She was on 1000 units vitamin D daily >> stopped by PCP >> restarted 500 units daily. Now 1000 units.  She visited [place] summer 2019.  ROS: +  see HPI  I reviewed pt's medications, allergies, PMH, social hx, family hx, and changes were documented in the history of present illness. Otherwise, unchanged from my initial visit note.  History   Social History   Marital Status: Married    Spouse Name: N/A   Number of Children: 2   Social History Main Topics   Smoking status: Former Smoker    Types: Cigarettes    Quit date: 11/17/1980   Smokeless tobacco: Never Used     Comment: smoked 1962-1982, up to 1 ppd   Alcohol Use: 1.2 oz/week    2 Glasses of wine per week     Comment: socially,2 / week   Drug Use: No   Sexual Activity: Yes    Birth Control/ Protection: Post-menopausal   Social History Narrative   Daily caffeine use   Married   Veterinary surgeon intermittently   Past Medical History:  Diagnosis Date   Abnormal finding on EKG    non specific T changes; short PR   Arthritis    Bursitis    tronchanteric   Complication of anesthesia    Severe PONV   Diabetes mellitus without complication ([place])    Pt states she is pre-diabetic (12-06-13)   Diverticulosis    Gastroparesis    93 % [place] at 2 hrs   GERD (gastroesophageal reflux disease)    H/O dilation and curettage    Hernia    hiatal   Hiatal hernia    Hyperlipidemia    Hyperlipidemia    hypoglycemia    Tubulovillous adenoma polyp of colon 06/2005   Hyperplastic 2007   Past Surgical History:  Procedure Laterality Date   CARDIAC CATHETERIZATION  2005   < 30 % lesion   COLONOSCOPY W/ POLYPECTOMY  2006 ,2007 & 2015   negative 2009; Dr Fuller Plan   DILATATION & CURETTAGE/HYSTEROSCOPY WITH TRUECLEAR N/A 01/11/2014   Procedure: DILATATION & CURETTAGE/HYSTEROSCOPY WITH TRUCLEAR;  Surgeon: Anastasio Auerbach, MD;  Location: [facility];  Service: Gynecology;  Laterality: N/A;   ESOPHAGEAL MANOMETRY  03/22/15   Decreased peristalsis   Esophagram  03/29/15   Persistent narrowing at site of hiatal hernia; proximal dilation   G 3 P 3     HIATAL HERNIA REPAIR   2000   LEG SURGERY     Trauma LLE/pins and plates   myomectomy  1999   uterine fibroid   ROTATOR CUFF REPAIR Right 05/2017   TRIGGER FINGER RELEASE Right    TUBAL LIGATION     Upper endoscopy and esophageal dilation  02/20/15   Dr Roney Mans , [facility]   Current Outpatient Medications on File Prior to Visit  Medication Sig Dispense Refill   acetaminophen (TYLENOL ARTHRITIS PAIN) 650 MG CR tablet Take 650 mg by mouth. 2 by mouth 2 times daily     aspirin 81 MG chewable  tablet Chew 81 mg by mouth daily.     atorvastatin (LIPITOR) 40 MG tablet TAKE 1 TABLET BY MOUTH EVERY DAY 90 tablet 1   Blood Glucose Monitoring Suppl (FREESTYLE FREEDOM LITE) w/Device KIT Use as instructed to check blood sugar 4 times daily. 1 kit 0   Calcium-Vitamin D-Vitamin K (VIACTIV PO) Take by mouth. Reported on 02/28/2016     Continuous Blood Gluc Sensor (FREESTYLE LIBRE 14 DAY SENSOR) MISC APPLY 1 SENSOR EVERY 14 DAYS. CHANGE EVERY 2 WEEKS 6 each 3   Cyanocobalamin (B-12) 1000 MCG TABS Take 1,000 mcg by mouth.     denosumab (PROLIA) 60 MG/ML SOSY injection Inject 60 mg into the skin every 6 (six) months.     esomeprazole (NEXIUM) 40 MG capsule Take 1 capsule (40 mg total) by mouth daily. --- Needs office visit before further refills    30 capsule 2   fexofenadine (ALLEGRA) 180 MG tablet Take 180 mg by mouth daily.     glucose blood (FREESTYLE TEST STRIPS) test strip FreeStyle Precision Neo test strips.  Use as instructed to check blood sugar 1 time a day as a back up for the Freestyle Libre CGM 100 each 12   glucose blood test strip Freestyle freedom test strip Use as instructed to check sugars E11.9 50 each 12   hydrochlorothiazide (MICROZIDE) 12.5 MG capsule TAKE 1 CAPSULE BY MOUTH EVERY DAY 90 capsule 1   hyoscyamine (LEVSIN SL) 0.125 MG SL tablet DISSOLVE 1-2 TABLETS UNDER TONGUE EVERY 4-6 HOURS AS NEEDED FOR ABDOMINAL PAIN AND CRAMPING 300 tablet 7   Lancets MISC by Does not apply route. Lifespan ultra 2 test strips and  lancets     montelukast (SINGULAIR) 10 MG tablet TAKE 1 TABLET BY MOUTH EVERY DAY AS NEEDED 90 tablet 1   ondansetron (ZOFRAN ODT) 4 MG disintegrating tablet Take 1 tablet (4 mg total) by mouth every 8 (eight) hours as needed for nausea or vomiting. 30 tablet 0   OVER THE COUNTER MEDICATION multistrain probiotic     VITAMIN D, CHOLECALCIFEROL, PO Take by mouth.     No current facility-administered medications on file prior to visit.   Allergies  Allergen Reactions   Domperidone     Stomach cramps   Fentanyl Nausea And Vomiting   Monistat [Miconazole Nitrate]     SEVERE BURNING   Nsaids     REACTION: stomach complaints   Oxycodone Hcl     REACTION: VOMITTING   Codeine     nausea   Erythromycin Ethylsuccinate     gastritis   Family History  Problem Relation Age of Onset   Ulcers Father    Diabetes Father    Hypertension Father    Heart attack Father        > 42   Stroke Father        in 48s   Heart attack Mother 49   Diabetes Sister    Heart attack Sister 55       CABG X 4   Cancer Neg Hx    Colitis Neg Hx    PE: BP 120/72 (BP Location: Right Arm, Patient Position: Sitting, Cuff Size: Normal)   Pulse 63   Ht 5' 4" (1.626 m)   Wt 152 lb 3.2 oz (69 kg)   LMP 11/18/2000   SpO2 99%   BMI 26.13 kg/m   Wt Readings from Last 3 Encounters:  09/16/22 152 lb 3.2 oz (69 kg)  07/07/22 149 lb (67.6 kg)  03/14/22 154 lb 12.8 oz (70.2 kg)   Constitutional: normal weight, in NAD Eyes:  EOMI, no exophthalmos ENT: no neck masses, no cervical lymphadenopathy Cardiovascular: RRR, No MRG Respiratory: CTA B Musculoskeletal: no deformities Skin:no rashes Neurological: no tremor with outstretched hands  ASSESSMENT: 1. Diet-controlled DM2, without complications.  2. Hypoglycemia - possibly reactive - Possibly related to a history of gastroparesis - improved after her esophageal stretching - We ruled out adrenal insufficiency: Component     Latest Ref Rng 02/02/2015  02/02/2015 02/02/2015         9:21 AM 10:02 AM 10:02 AM  Cortisol, Plasma      7.1 29.1 24.2  C206 ACTH     6 - 50 pg/mL 12     3.  Clinical osteoporosis  4.  Vitamin D insufficiency  PLAN:  1. DM2 and 2. History of reactive/postprandial hypoglycemia -Diet controlled -At previous visits we discussed about the guidelines not recommending CGM monitoring for patients with reactive hypoglycemia due to increase anxiety.  I explained that an occasional drop in blood sugars under 70 may not be abnormal, especially on the CGM, which may show her readings that are 20 to 30 mg/dL lower than they actually are and also, especially since she is not feeling them.  It is likely that her body is able to correct these.  This does not mean that she should not improve her diet to avoid hyperglycemic spikes, for example eliminating concentrated sweets (discussed about examples), since these decrease in glucose levels appear to happen after an initial hyperglycemic peak.  However, she appears that she is usually trying to chase the low blood sugars throughout the day and this appears to be decreasing her quality of life.  She mentions that she is not going out to eat at night if her sugars are low. - I did not suggest therapeutic intervention other than having sweets with her just in case she needs to correct low blood sugars, but I did advise her to observe them rather than trying to correct them immediately to ideally allow the body to correct them.  At last visit, she was telling me that she was not happy that nothing was done to treat her for this condition... I explained that there is no treatment available for this.  Dietary changes are first: Not drinking liquids with meals and avoiding concentrated sweets, and also starting the meals with protein and fat and ending with carbs.  However, she then realized that allowing the blood sugars to drop a little bit more before correcting them actually helps.  She is not  fighting with the blood sugars much as before.  She still corrects lower blood sugars and 80s and I advised her to try to only correct when sugars are lower than 70s and trending down.  In time, I am hoping that we can get her to only correct sugars lower than 60s. CGM interpretation: -At today's visit, we reviewed her CGM downloads: It appears that 90% of values are in target range (goal >70%), while 9% are higher than 180 (goal <25%), and 1% are lower than 70 (goal <4%).  The calculated average blood sugar is 131. Estimated HbA1c was 6.4%. -Reviewing the CGM trends, it appears that the sugars are fluctuating within the target range with occasional higher blood sugars after lunch, followed by decreasing blood sugars, sometimes (rarely) to the 40s to 60s).  We discussed about changing her lunch to avoid high glycemic index foods and including healthy  fats.  Given examples.  I also suggested to change from her freestyle libre CGM to her freestyle libre 2 CGM, which she is able to get her alarms.  She agrees with this change. - we checked her HbA1c: 6.5% (stable) - advised to check sugars at different times of the day - 4x a day, rotating check times - advised for yearly eye exams >> she is UTD - return to clinic in 3-4 months  3. Osteopenia + fracture >> clinical osteoporosis - likely age-related + postmenopausal - no falls or fractures since last visit - she does qualify for treatment as she has clinical osteoporosis (osteopenia on the bone density scan + history of humeral fracture) - On the last bone density scan she had lower femoral neck bone density.  She will be due for another bone density scan next year. - she started Prolia since last visit (through PCPs office).  First dose 03/19/2022, tolerating it well. No jaw/thigh/hip pain.  She is due for another dose next month.  4.  Vitamin D insufficiency -She had a vitamin D level at the upper limit of normal, 97.5, in the past so we stopped her  vitamin D supplement at that time. -Currently on 1000 units daily -Latest vitamin D level was normal in 12/2021 -We will repeat a vitamin D level at next lab draw  - already ordered by PCP  Philemon Kingdom, MD PhD [facility]

## 2022-09-16 NOTE — Patient Instructions (Addendum)
Please continue Prolia.  Try to add healthy fats with lunch as discussed.  Change to the [facility] 2 CGM.  Please come back for a follow-up appointment in 6 months.

## 2022-09-16 NOTE — Telephone Encounter (Signed)
Last Prolia injection 03/19/2022

## 2022-09-16 NOTE — Telephone Encounter (Signed)
Pharmacy Patient Advocate Encounter  Insurance verification completed.    The patient is insured through [employer] claims for: Prolia '60mg'$ .  Pharmacy benefit copay: $923.65

## 2022-09-17 ENCOUNTER — Encounter: Payer: Self-pay | Admitting: Internal Medicine

## 2022-09-18 DIAGNOSIS — M542 Cervicalgia: Secondary | ICD-10-CM | POA: Diagnosis not present

## 2022-09-18 NOTE — Telephone Encounter (Signed)
Patient scheduled.

## 2022-09-18 NOTE — Telephone Encounter (Signed)
Patient ready to schedule 09/20/22 or after  Out-of-pocket cost due at time of visit: $0.00   Primary: Medicare Prolia co-insurance: 20% (approximately $276) Admin fee co-insurance: 20% (approximately $25)   Secondary: [employer] Prolia co-insurance: Covers Medicare Part B co-insurance Admin fee co-insurance: Covers Medicare Part B co-insurance.    Deductible: $226 of $226 met    Prior Auth: not required  ** This summary of benefits is an estimation of the patient's out-of-pocket cost. Exact cost may vary based on individual plan coverage.

## 2022-09-22 ENCOUNTER — Ambulatory Visit (INDEPENDENT_AMBULATORY_CARE_PROVIDER_SITE_OTHER): Payer: Medicare Other

## 2022-09-22 DIAGNOSIS — D692 Other nonthrombocytopenic purpura: Secondary | ICD-10-CM | POA: Diagnosis not present

## 2022-09-22 DIAGNOSIS — L820 Inflamed seborrheic keratosis: Secondary | ICD-10-CM | POA: Diagnosis not present

## 2022-09-22 DIAGNOSIS — L814 Other melanin hyperpigmentation: Secondary | ICD-10-CM | POA: Diagnosis not present

## 2022-09-22 DIAGNOSIS — M810 Age-related osteoporosis without current pathological fracture: Secondary | ICD-10-CM | POA: Diagnosis not present

## 2022-09-22 DIAGNOSIS — L570 Actinic keratosis: Secondary | ICD-10-CM | POA: Diagnosis not present

## 2022-09-22 DIAGNOSIS — L821 Other seborrheic keratosis: Secondary | ICD-10-CM | POA: Diagnosis not present

## 2022-09-22 DIAGNOSIS — Z85828 Personal history of other malignant neoplasm of skin: Secondary | ICD-10-CM | POA: Diagnosis not present

## 2022-09-22 MED ORDER — DENOSUMAB 60 MG/ML ~~LOC~~ SOSY
60.0000 mg | PREFILLED_SYRINGE | Freq: Once | SUBCUTANEOUS | Status: AC
  Administered 2022-09-22: 60 mg via SUBCUTANEOUS

## 2022-09-22 NOTE — Progress Notes (Signed)
Pt received Prolia injection w/o any complications.

## 2022-09-23 DIAGNOSIS — M47812 Spondylosis without myelopathy or radiculopathy, cervical region: Secondary | ICD-10-CM | POA: Diagnosis not present

## 2022-09-23 DIAGNOSIS — M542 Cervicalgia: Secondary | ICD-10-CM | POA: Diagnosis not present

## 2022-10-21 DIAGNOSIS — M47812 Spondylosis without myelopathy or radiculopathy, cervical region: Secondary | ICD-10-CM | POA: Diagnosis not present

## 2022-10-21 DIAGNOSIS — M542 Cervicalgia: Secondary | ICD-10-CM | POA: Diagnosis not present

## 2022-10-23 DIAGNOSIS — M47812 Spondylosis without myelopathy or radiculopathy, cervical region: Secondary | ICD-10-CM | POA: Diagnosis not present

## 2022-10-23 DIAGNOSIS — M542 Cervicalgia: Secondary | ICD-10-CM | POA: Diagnosis not present

## 2022-10-28 DIAGNOSIS — M47812 Spondylosis without myelopathy or radiculopathy, cervical region: Secondary | ICD-10-CM | POA: Diagnosis not present

## 2022-10-28 DIAGNOSIS — M542 Cervicalgia: Secondary | ICD-10-CM | POA: Diagnosis not present

## 2022-10-30 DIAGNOSIS — M47812 Spondylosis without myelopathy or radiculopathy, cervical region: Secondary | ICD-10-CM | POA: Diagnosis not present

## 2022-10-30 DIAGNOSIS — M542 Cervicalgia: Secondary | ICD-10-CM | POA: Diagnosis not present

## 2022-11-04 DIAGNOSIS — M47812 Spondylosis without myelopathy or radiculopathy, cervical region: Secondary | ICD-10-CM | POA: Diagnosis not present

## 2022-11-04 DIAGNOSIS — M542 Cervicalgia: Secondary | ICD-10-CM | POA: Diagnosis not present

## 2022-11-07 ENCOUNTER — Other Ambulatory Visit: Payer: Self-pay | Admitting: Internal Medicine

## 2022-11-12 ENCOUNTER — Other Ambulatory Visit: Payer: Self-pay | Admitting: Internal Medicine

## 2022-11-13 DIAGNOSIS — M47812 Spondylosis without myelopathy or radiculopathy, cervical region: Secondary | ICD-10-CM | POA: Diagnosis not present

## 2022-11-13 DIAGNOSIS — M542 Cervicalgia: Secondary | ICD-10-CM | POA: Diagnosis not present

## 2022-12-26 DIAGNOSIS — Z1231 Encounter for screening mammogram for malignant neoplasm of breast: Secondary | ICD-10-CM | POA: Diagnosis not present

## 2022-12-26 LAB — HM MAMMOGRAPHY

## 2023-01-07 ENCOUNTER — Ambulatory Visit: Payer: Medicare Other | Admitting: Internal Medicine

## 2023-01-13 ENCOUNTER — Encounter: Payer: Self-pay | Admitting: Internal Medicine

## 2023-01-13 NOTE — Progress Notes (Unsigned)
Subjective:    Patient ID: Hannah Bryant, female    DOB: February 22, 1946, 77 y.o.   MRN: [id number]     HPI Kimothy is here for follow up of her chronic medical problems, including htn, hld, DM, OP, allergic rhinitis    Medications and allergies reviewed with patient and updated if appropriate.  Current Outpatient Medications on File Prior to Visit  Medication Sig Dispense Refill   acetaminophen (TYLENOL ARTHRITIS PAIN) 650 MG CR tablet Take 650 mg by mouth. 2 by mouth 2 times daily     aspirin 81 MG chewable tablet Chew 81 mg by mouth daily.     atorvastatin (LIPITOR) 40 MG tablet TAKE 1 TABLET BY MOUTH EVERY DAY 90 tablet 1   Blood Glucose Monitoring Suppl (FREESTYLE FREEDOM LITE) w/Device KIT Use as instructed to check blood sugar 4 times daily. 1 kit 0   Calcium-Vitamin D-Vitamin K (VIACTIV PO) Take by mouth. Reported on 02/28/2016     Continuous Blood Gluc Receiver (FREESTYLE LIBRE 2 READER) DEVI USE AS DIRECTED 1 each 3   Continuous Blood Gluc Sensor (FREESTYLE LIBRE 2 SENSOR) MISC 1 each by Does not apply route every 14 (fourteen) days. 6 each 3   Cyanocobalamin (B-12) 1000 MCG TABS Take 1,000 mcg by mouth.     denosumab (PROLIA) 60 MG/ML SOSY injection Inject 60 mg into the skin every 6 (six) months.     esomeprazole (NEXIUM) 40 MG capsule Take 1 capsule (40 mg total) by mouth daily. --- Needs office visit before further refills    30 capsule 2   fexofenadine (ALLEGRA) 180 MG tablet Take 180 mg by mouth daily.     glucose blood (FREESTYLE TEST STRIPS) test strip FreeStyle Precision Neo test strips.  Use as instructed to check blood sugar 1 time a day as a back up for the Freestyle Libre CGM 100 each 12   glucose blood test strip Freestyle freedom test strip Use as instructed to check sugars E11.9 50 each 12   hydrochlorothiazide (MICROZIDE) 12.5 MG capsule TAKE 1 CAPSULE BY MOUTH EVERY DAY 90 capsule 1   hyoscyamine (LEVSIN SL) 0.125 MG SL tablet DISSOLVE 1-2 TABLETS UNDER  TONGUE EVERY 4-6 HOURS AS NEEDED FOR ABDOMINAL PAIN AND CRAMPING 900 tablet 3   Lancets MISC by Does not apply route. Lifespan ultra 2 test strips and lancets     montelukast (SINGULAIR) 10 MG tablet TAKE 1 TABLET BY MOUTH EVERY DAY AS NEEDED 90 tablet 1   ondansetron (ZOFRAN ODT) 4 MG disintegrating tablet Take 1 tablet (4 mg total) by mouth every 8 (eight) hours as needed for nausea or vomiting. 30 tablet 0   OVER THE COUNTER MEDICATION multistrain probiotic     VITAMIN D, CHOLECALCIFEROL, PO Take by mouth.     No current facility-administered medications on file prior to visit.     Review of Systems     Objective:  There were no vitals filed for this visit. BP Readings from Last 3 Encounters:  09/16/22 120/72  07/07/22 126/78  03/14/22 (!) 140/92   Wt Readings from Last 3 Encounters:  09/16/22 152 lb 3.2 oz (69 kg)  07/07/22 149 lb (67.6 kg)  03/14/22 154 lb 12.8 oz (70.2 kg)   There is no height or weight on file to calculate BMI.    Physical Exam     Lab Results  Component Value Date   WBC 6.6 01/07/2022   HGB 13.6 01/07/2022   HCT 40.9  01/07/2022   PLT 159.0 01/07/2022   GLUCOSE 122 (H) 01/07/2022   CHOL 165 01/07/2022   TRIG 133.0 01/07/2022   HDL 72.10 01/07/2022   LDLCALC 66 01/07/2022   ALT 17 01/07/2022   AST 21 01/07/2022   NA 140 01/07/2022   K 3.4 (L) 01/07/2022   CL 99 01/07/2022   CREATININE 0.81 01/07/2022   BUN 16 01/07/2022   CO2 36 (H) 01/07/2022   TSH 1.12 09/09/2019   HGBA1C 6.5 (A) 09/16/2022   MICROALBUR 1.2 01/07/2022     Assessment & Plan:    See Problem List for Assessment and Plan of chronic medical problems.

## 2023-01-13 NOTE — Patient Instructions (Addendum)
      Blood work was ordered.   The lab is on the first floor.    Medications changes include :   none       Return in about 6 months (around 07/15/2023) for follow up.

## 2023-01-14 ENCOUNTER — Ambulatory Visit (INDEPENDENT_AMBULATORY_CARE_PROVIDER_SITE_OTHER): Payer: Medicare Other | Admitting: Internal Medicine

## 2023-01-14 VITALS — BP 128/78 | HR 60 | Temp 98.0°F | Wt 144.0 lb

## 2023-01-14 DIAGNOSIS — K3184 Gastroparesis: Secondary | ICD-10-CM | POA: Diagnosis not present

## 2023-01-14 DIAGNOSIS — E11649 Type 2 diabetes mellitus with hypoglycemia without coma: Secondary | ICD-10-CM

## 2023-01-14 DIAGNOSIS — E161 Other hypoglycemia: Secondary | ICD-10-CM

## 2023-01-14 DIAGNOSIS — R110 Nausea: Secondary | ICD-10-CM | POA: Diagnosis not present

## 2023-01-14 DIAGNOSIS — J309 Allergic rhinitis, unspecified: Secondary | ICD-10-CM | POA: Diagnosis not present

## 2023-01-14 DIAGNOSIS — M810 Age-related osteoporosis without current pathological fracture: Secondary | ICD-10-CM

## 2023-01-14 DIAGNOSIS — I10 Essential (primary) hypertension: Secondary | ICD-10-CM | POA: Diagnosis not present

## 2023-01-14 DIAGNOSIS — E7849 Other hyperlipidemia: Secondary | ICD-10-CM | POA: Diagnosis not present

## 2023-01-14 MED ORDER — ONDANSETRON 4 MG PO TBDP: 4.0000 mg | ORAL_TABLET | Freq: Three times a day (TID) | ORAL | 0 refills | Status: DC | PRN

## 2023-01-14 NOTE — Assessment & Plan Note (Addendum)
Chronic Lab Results  Component Value Date   HGBA1C 6.5 (A) 09/16/2022   Diet controlled-frequent hypoglycemia Management per Dr Cruzita Lederer

## 2023-01-14 NOTE — Assessment & Plan Note (Signed)
Chronic Complaint with GERD diet GERD controlled Continue nexium 40 mg daily

## 2023-01-14 NOTE — Assessment & Plan Note (Signed)
Chronic Intermittent Zofran as needed

## 2023-01-14 NOTE — Assessment & Plan Note (Signed)
Chronic Getting frequent hypoglycemia Has had sugars into the 30s Has freestyle, but turned off alarm for low sugars-needs to turn back on to help avoid very low sugars Following with Dr. Cruzita Lederer

## 2023-01-14 NOTE — Assessment & Plan Note (Addendum)
Chronic Related to DM Has to eat very small portions Having frequent hypoglycemia Will be seeing nutritionist again soon, which will hopefully help-stressed protein with each meal/snack

## 2023-01-14 NOTE — Assessment & Plan Note (Signed)
Chronic Continue Prolia every 6 months-last injection 09/2022 Check CMP, vitamin D level Continue calcium and vitamin D daily Stressed regular walking

## 2023-01-14 NOTE — Assessment & Plan Note (Signed)
Chronic Regular exercise and healthy diet encouraged Check lipid panel  Continue atorvastatin 40 mg daily 

## 2023-01-14 NOTE — Assessment & Plan Note (Signed)
Chronic BP well controlled Continue hctz 12.5 mg daily cmp

## 2023-01-14 NOTE — Assessment & Plan Note (Signed)
Chronic Controlled, stable Continue singulair 10 mg HS, allegra

## 2023-01-15 ENCOUNTER — Other Ambulatory Visit (INDEPENDENT_AMBULATORY_CARE_PROVIDER_SITE_OTHER): Payer: Medicare Other

## 2023-01-15 DIAGNOSIS — E11649 Type 2 diabetes mellitus with hypoglycemia without coma: Secondary | ICD-10-CM

## 2023-01-15 DIAGNOSIS — M810 Age-related osteoporosis without current pathological fracture: Secondary | ICD-10-CM

## 2023-01-15 DIAGNOSIS — I10 Essential (primary) hypertension: Secondary | ICD-10-CM | POA: Diagnosis not present

## 2023-01-15 DIAGNOSIS — E7849 Other hyperlipidemia: Secondary | ICD-10-CM | POA: Diagnosis not present

## 2023-01-15 LAB — COMPREHENSIVE METABOLIC PANEL
ALT: 16 U/L (ref 0–35)
AST: 19 U/L (ref 0–37)
Albumin: 3.8 g/dL (ref 3.5–5.2)
Alkaline Phosphatase: 35 U/L — ABNORMAL LOW (ref 39–117)
BUN: 18 mg/dL (ref 6–23)
CO2: 33 mEq/L — ABNORMAL HIGH (ref 19–32)
Calcium: 9.3 mg/dL (ref 8.4–10.5)
Chloride: 102 mEq/L (ref 96–112)
Creatinine, Ser: 0.76 mg/dL (ref 0.40–1.20)
GFR: 75.19 mL/min (ref >60.00)
Glucose, Bld: 119 mg/dL — ABNORMAL HIGH (ref 70–99)
Potassium: 3.9 mEq/L (ref 3.5–5.1)
Sodium: 143 mEq/L (ref 135–145)
Total Bilirubin: 0.5 mg/dL (ref 0.2–1.2)
Total Protein: 6.2 g/dL (ref 6.0–8.3)

## 2023-01-15 LAB — LIPID PANEL
Cholesterol: 140 mg/dL (ref 0–200)
HDL: 63.4 mg/dL (ref >39.00)
LDL Cholesterol: 53 mg/dL (ref 0–99)
NonHDL: 77.04
Total CHOL/HDL Ratio: 2
Triglycerides: 119 mg/dL (ref 0.0–149.0)
VLDL: 23.8 mg/dL (ref 0.0–40.0)

## 2023-01-15 LAB — CBC WITH DIFFERENTIAL/PLATELET
Basophils Absolute: 0 10*3/uL (ref 0.0–0.1)
Basophils Relative: 0.7 % (ref 0.0–3.0)
Eosinophils Absolute: 0.2 10*3/uL (ref 0.0–0.7)
Eosinophils Relative: 2.6 % (ref 0.0–5.0)
HCT: 40.6 % (ref 36.0–46.0)
Hemoglobin: 13.9 g/dL (ref 12.0–15.0)
Lymphocytes Relative: 35.5 % (ref 12.0–46.0)
Lymphs Abs: 2.1 10*3/uL (ref 0.7–4.0)
MCHC: 34.1 g/dL (ref 30.0–36.0)
MCV: 95.7 fl (ref 78.0–100.0)
Monocytes Absolute: 0.4 10*3/uL (ref 0.1–1.0)
Monocytes Relative: 7.4 % (ref 3.0–12.0)
Neutro Abs: 3.2 10*3/uL (ref 1.4–7.7)
Neutrophils Relative %: 53.8 % (ref 43.0–77.0)
Platelets: 153 10*3/uL (ref 150.0–400.0)
RBC: 4.24 Mil/uL (ref 3.87–5.11)
RDW: 13.4 % (ref 11.5–15.5)
WBC: 6 10*3/uL (ref 4.0–10.5)

## 2023-01-15 LAB — MICROALBUMIN / CREATININE URINE RATIO
Creatinine,U: 174.3 mg/dL
Microalb Creat Ratio: 0.6 mg/g (ref 0.0–30.0)
Microalb, Ur: 1.1 mg/dL (ref 0.0–1.9)

## 2023-01-15 LAB — VITAMIN D 25 HYDROXY (VIT D DEFICIENCY, FRACTURES): VITD: 26.89 ng/mL — ABNORMAL LOW (ref 30.00–100.00)

## 2023-01-20 ENCOUNTER — Encounter: Payer: Self-pay | Admitting: Internal Medicine

## 2023-01-21 ENCOUNTER — Encounter: Payer: Self-pay | Admitting: Internal Medicine

## 2023-01-21 NOTE — Progress Notes (Signed)
Outside notes received. Information abstracted. Notes sent to scan.  

## 2023-01-26 ENCOUNTER — Encounter: Payer: Self-pay | Admitting: Internal Medicine

## 2023-01-26 DIAGNOSIS — Z23 Encounter for immunization: Secondary | ICD-10-CM | POA: Diagnosis not present

## 2023-02-09 ENCOUNTER — Encounter: Payer: Self-pay | Admitting: Internal Medicine

## 2023-02-11 ENCOUNTER — Encounter: Payer: Self-pay | Admitting: Internal Medicine

## 2023-02-11 ENCOUNTER — Other Ambulatory Visit: Payer: Self-pay

## 2023-02-11 MED ORDER — FREESTYLE LIBRE 3 READER DEVI: 1.0000 | 0 refills | Status: DC

## 2023-02-11 MED ORDER — FREESTYLE LIBRE 3 SENSOR MISC: 2 refills | Status: DC

## 2023-03-17 ENCOUNTER — Ambulatory Visit: Payer: Medicare Other | Admitting: Internal Medicine

## 2023-03-25 ENCOUNTER — Ambulatory Visit (INDEPENDENT_AMBULATORY_CARE_PROVIDER_SITE_OTHER): Payer: Medicare Other | Admitting: Internal Medicine

## 2023-03-25 ENCOUNTER — Encounter: Payer: Self-pay | Admitting: Internal Medicine

## 2023-03-25 VITALS — BP 130/82 | HR 64 | Ht 64.0 in | Wt 152.0 lb

## 2023-03-25 DIAGNOSIS — M810 Age-related osteoporosis without current pathological fracture: Secondary | ICD-10-CM

## 2023-03-25 DIAGNOSIS — E559 Vitamin D deficiency, unspecified: Secondary | ICD-10-CM | POA: Diagnosis not present

## 2023-03-25 DIAGNOSIS — E161 Other hypoglycemia: Secondary | ICD-10-CM

## 2023-03-25 DIAGNOSIS — E119 Type 2 diabetes mellitus without complications: Secondary | ICD-10-CM

## 2023-03-25 DIAGNOSIS — E11649 Type 2 diabetes mellitus with hypoglycemia without coma: Secondary | ICD-10-CM

## 2023-03-25 LAB — POCT GLYCOSYLATED HEMOGLOBIN (HGB A1C): Hemoglobin A1C: 6.6 % — AB (ref 4.0–5.6)

## 2023-03-25 LAB — VITAMIN D 25 HYDROXY (VIT D DEFICIENCY, FRACTURES): VITD: 43.36 ng/mL (ref 30.00–100.00)

## 2023-03-25 MED ORDER — ACARBOSE 25 MG PO TABS: ORAL_TABLET | ORAL | 3 refills | Status: DC

## 2023-03-25 NOTE — Progress Notes (Unsigned)
Patient ID: Hannah Bryant, female   DOB: 05/15/1946, 77 y.o.   MRN: [id number]   HPI: Hannah Bryant is a 77 y.o.-year-old female, presenting for f/u for DM2, dx in ~2009 (or earlier), diet-controlled, with complications (reactive hypoglycemia) and osteoporosis.  Previously seen by Dr. Talmage Nap. Last 06/2016.  Last visit with me 6 months ago.  Interim history: No increased urination, blurry vision, nausea, chest pain.  She has night sweats. She still occasionally sees lower blood sugars in the afternoon, in the 40s-50s. She is seeing nutritionist >> started 2 weeks ago.  Reactive hypoglycemia:  Reviewed history: Pt described that has had hypoglycemia every pm for "ages". Sometimes: sweating, feeling hot, nausea, sometimes vomiting.  She may need to take a nap afterward.  She started to see Dr Merri Brunette ([facility]) >> hypoglycemic episodes = better. She had an EGD, Ba swallow test >> she had a stricture at the site of her previous hiatal hernia with food retention in the Esophagus long after she eats (procedure: Spring 2016). She had Esophageal dilation. She also had a capsule endoscopy.  Her hypoglycemia improved after esophageal stretching.  Lowest CBG are in the 50s but lately in the 60s.  She corrects these with OJ or honey sticks. Lunch is a sandwich/wrap + yoghurt at 1 pm and snack at 3 pm (cheese or PB+apples or PB crackers), supper at 7:30 pm.  DM2:  HbA1c levels reviewed: Lab Results  Component Value Date   HGBA1C 6.5 (A) 09/16/2022   HGBA1C 6.5 (A) 03/14/2022   HGBA1C 6.4 (A) 10/04/2021   She has previously on diabetic medications but she stopped that she was not feeling well on them.  She would not want to restart.  She checks her sugars more than 4 times a day with her freestyle libre CGM:  Previously:   Prev.:  Lowest sugar was: 58 >> 40 >> 40s She has hypoglycemia awareness in the 80s. Highest sugar was 300s ...>> 262 >> 200.  Glucometer: 4: OneTouch Verio and  Freestyle  She saw nutrition in 2016.  -No CKD, last BUN/creatinine:  Lab Results  Component Value Date   BUN 18 01/15/2023   CREATININE 0.76 01/15/2023   -+ HL; last set of lipids: Lab Results  Component Value Date   CHOL 140 01/15/2023   HDL 63.40 01/15/2023   LDLCALC 53 01/15/2023   TRIG 119.0 01/15/2023   CHOLHDL 2 01/15/2023  On Lipitor.  She has a history of a humerus fracture in 01/2017 (however, this was not seen on x-ray...). She has osteopenia on DXA but with her history of humerus fracture, she has clinical osteoporosis:  Reviewed her previous DXA scan reports: 01/18/2021 Lumbar spine L1- L3 (L4) Femoral neck (FN)  T-score  +1.2 RFN: -2.2 LFN: -2.2  Change in BMD from previous DXA test (%)  +5.1%* -6.6%*  (*) statistically significant  02/08/2017 Lumbar spine (L1-L4) Femoral neck (FN)  T-score 0.6 RFN:-2.0 LFN:-1.6  Change in BMD from previous DXA test (%) Down 2.0% Down 4.3%    02/03/2021 Lumbar spine L1- L3 (L4) Femoral neck (FN)  T-score  +1.2 RFN: -2.2 LFN: -2.2  Change in BMD from previous DXA test (%)  +5.1%* -6.6%*  (*) statistically significant  I suggested Prolia at previous visits and we discussed about benefits and possible side effects.  She prefered to wait until her dental implants were completed to decide about this. Now she finished dental work (sees Dr. Dennison Mascot).  She finally started this  on 03/24/2022 in PCPs office.  Vitamin D insufficiency:  Reviewed vitamin D levels: Lab Results  Component Value Date   VD25OH 26.89 (L) 01/15/2023   VD25OH 37.31 01/07/2022   VD25OH 37.68 03/20/2021   VD25OH 38.19 01/22/2021   VD25OH 31.7 07/19/2020   VD25OH 97.50 09/09/2019   VD25OH 33.78 09/27/2018   VD25OH 24.24 (L) 03/24/2018   VD25OH 28.68 (L) 03/04/2017   She was on 1000 units vitamin D daily >> stopped by PCP >> restarted 500 units daily.  She increased the dose to 2000 units.  ROS: + see HPI  I reviewed pt's medications,  allergies, PMH, social hx, family hx, and changes were documented in the history of present illness. Otherwise, unchanged from my initial visit note.  History   Social History   Marital Status: Married    Spouse Name: N/A   Number of Children: 2   Social History Main Topics   Smoking status: Former Smoker    Types: Cigarettes    Quit date: 11/17/1980   Smokeless tobacco: Never Used     Comment: smoked 1962-1982, up to 1 ppd   Alcohol Use: 1.2 oz/week    2 Glasses of wine per week     Comment: socially,2 / week   Drug Use: No   Sexual Activity: Yes    Birth Control/ Protection: Post-menopausal   Social History Narrative   Daily caffeine use   Married   Metallurgist intermittently   Past Medical History:  Diagnosis Date   Abnormal finding on EKG    non specific T changes; short PR   Arthritis    Bursitis    tronchanteric   Complication of anesthesia    Severe PONV   Diabetes mellitus without complication (HCC)    Pt states she is pre-diabetic (12-06-13)   Diverticulosis    Gastroparesis    93 % retenton at 2 hrs   GERD (gastroesophageal reflux disease)    H/O dilation and curettage    Hernia    hiatal   Hiatal hernia    Hyperlipidemia    Hyperlipidemia    hypoglycemia    Tubulovillous adenoma polyp of colon 06/2005   Hyperplastic 2007   Past Surgical History:  Procedure Laterality Date   CARDIAC CATHETERIZATION  2005   < 30 % lesion   COLONOSCOPY W/ POLYPECTOMY  2006 ,2007 & 2015   negative 2009; Dr Russella Dar   DILATATION & CURETTAGE/HYSTEROSCOPY WITH TRUECLEAR N/A 01/11/2014   Procedure: DILATATION & CURETTAGE/HYSTEROSCOPY WITH TRUCLEAR;  Surgeon: Dara Lords, MD;  Location: [facility];  Service: Gynecology;  Laterality: N/A;   ESOPHAGEAL MANOMETRY  03/22/15   Decreased peristalsis   Esophagram  03/29/15   Persistent narrowing at site of hiatal hernia; proximal dilation   G 3 P 3     HIATAL HERNIA REPAIR  2000   LEG SURGERY     Trauma LLE/pins  and plates   myomectomy  1999   uterine fibroid   ROTATOR CUFF REPAIR Right 05/2017   TRIGGER FINGER RELEASE Right    TUBAL LIGATION     Upper endoscopy and esophageal dilation  02/20/15   Dr Merri Brunette , [facility]   Current Outpatient Medications on File Prior to Visit  Medication Sig Dispense Refill   acetaminophen (TYLENOL ARTHRITIS PAIN) 650 MG CR tablet Take 650 mg by mouth. 2 by mouth 2 times daily     aspirin 81 MG chewable tablet Chew 81 mg by mouth daily.  atorvastatin (LIPITOR) 40 MG tablet TAKE 1 TABLET BY MOUTH EVERY DAY 90 tablet 1   Blood Glucose Monitoring Suppl (FREESTYLE FREEDOM LITE) w/Device KIT Use as instructed to check blood sugar 4 times daily. 1 kit 0   Calcium-Vitamin D-Vitamin K (VIACTIV PO) Take by mouth. Reported on 02/28/2016     Continuous Blood Gluc Receiver (FREESTYLE LIBRE 2 READER) DEVI USE AS DIRECTED 1 each 3   Continuous Blood Gluc Receiver (FREESTYLE LIBRE 3 READER) DEVI 1 Device by Does not apply route continuous. 1 each 0   Continuous Blood Gluc Sensor (FREESTYLE LIBRE 2 SENSOR) MISC 1 each by Does not apply route every 14 (fourteen) days. 6 each 3   Continuous Blood Gluc Sensor (FREESTYLE LIBRE 3 SENSOR) MISC Place 1 sensor on the skin every 14 days. Use to check glucose continuously 2 each 2   Cyanocobalamin (B-12) 1000 MCG TABS Take 1,000 mcg by mouth.     denosumab (PROLIA) 60 MG/ML SOSY injection Inject 60 mg into the skin every 6 (six) months.     esomeprazole (NEXIUM) 40 MG capsule Take 1 capsule (40 mg total) by mouth daily. --- Needs office visit before further refills    30 capsule 2   fexofenadine (ALLEGRA) 180 MG tablet Take 180 mg by mouth daily.     glucose blood (FREESTYLE TEST STRIPS) test strip FreeStyle Precision Neo test strips.  Use as instructed to check blood sugar 1 time a day as a back up for the Freestyle Libre CGM 100 each 12   glucose blood test strip Freestyle freedom test strip Use as instructed to check sugars E11.9 50 each 12    hydrochlorothiazide (MICROZIDE) 12.5 MG capsule TAKE 1 CAPSULE BY MOUTH EVERY DAY 90 capsule 1   hyoscyamine (LEVSIN SL) 0.125 MG SL tablet DISSOLVE 1-2 TABLETS UNDER TONGUE EVERY 4-6 HOURS AS NEEDED FOR ABDOMINAL PAIN AND CRAMPING 900 tablet 3   Lancets MISC by Does not apply route. Lifespan ultra 2 test strips and lancets     Meclizine HCl (BONINE PO) Take by mouth.     montelukast (SINGULAIR) 10 MG tablet TAKE 1 TABLET BY MOUTH EVERY DAY AS NEEDED 90 tablet 1   ondansetron (ZOFRAN ODT) 4 MG disintegrating tablet Take 1 tablet (4 mg total) by mouth every 8 (eight) hours as needed for nausea or vomiting. 30 tablet 0   OVER THE COUNTER MEDICATION multistrain probiotic     VITAMIN D, CHOLECALCIFEROL, PO Take by mouth.     No current facility-administered medications on file prior to visit.   Allergies  Allergen Reactions   Domperidone     Stomach cramps   Fentanyl Nausea And Vomiting   Monistat [Miconazole Nitrate]     SEVERE BURNING   Nsaids     REACTION: stomach complaints   Oxycodone Hcl     REACTION: VOMITTING   Codeine     nausea   Erythromycin Ethylsuccinate     gastritis   Family History  Problem Relation Age of Onset   Ulcers Father    Diabetes Father    Hypertension Father    Heart attack Father        > 57   Stroke Father        in 54s   Heart attack Mother 85   Diabetes Sister    Heart attack Sister 80       CABG X 4   Cancer Neg Hx    Colitis Neg Hx    PE: BP  130/82 (BP Location: Right Arm, Patient Position: Sitting, Cuff Size: Normal)   Pulse 64   Ht 5\' 4"  (1.626 m)   Wt 152 lb (68.9 kg)   LMP 11/18/2000   SpO2 99%   BMI 26.09 kg/m   Wt Readings from Last 3 Encounters:  03/25/23 152 lb (68.9 kg)  01/14/23 144 lb (65.3 kg)  09/16/22 152 lb 3.2 oz (69 kg)   Constitutional: normal weight, in NAD Eyes:  EOMI, no exophthalmos ENT: no neck masses, no cervical lymphadenopathy Cardiovascular: RRR, No MRG Respiratory: CTA B Musculoskeletal: no  deformities Skin:no rashes Neurological: no tremor with outstretched hands  ASSESSMENT: 1. Diet-controlled DM2, without complications.  2. Hypoglycemia - possibly reactive - Possibly related to a history of gastroparesis - improved after her esophageal stretching - We ruled out adrenal insufficiency: Component     Latest Ref Rng 02/02/2015 02/02/2015 02/02/2015         9:21 AM 10:02 AM 10:02 AM  Cortisol, Plasma      7.1 29.1 24.2  C206 ACTH     6 - 50 pg/mL 12     3.  Clinical osteoporosis  4.  Vitamin D insufficiency  PLAN:  1. DM2 and 2. History of reactive/postprandial hypoglycemia -Diet controlled -At previous visits we discussed about the guidelines not recommending CGM monitoring for patients with reactive hypoglycemia due to increase anxiety.  I explained that an occasional drop in blood sugars under 70 may not be abnormal, especially on the CGM, which may show her readings that are 20 to 30 mg/dL lower than they actually are and also, especially since she is not feeling them.  It is likely that her body is able to correct these.  This does not mean that she should not improve her diet to avoid hyperglycemic spikes, for example eliminating concentrated sweets (discussed about examples), since these decrease in glucose levels appear to happen after an initial hyperglycemic peak.  However, she appears that she is usually trying to chase the low blood sugars throughout the day and this appears to be decreasing her quality of life.  She mentions that she is not going out to eat at night if her sugars are low. -At previous visits I did not suggest therapeutic intervention (there is no approved treatment available for this condition, rather, dietary changes are necessary).  I did recommend having carbs with her just in case she needed to correct a low blood sugar.  I did advise her to try not to correct the blood sugars immediately but allow the body to correct them.  She did notice that  allowing the blood sugars to drop a little bit more before correcting them actually helped.  She is currently not fighting with the blood sugars as much as before.  She still corrects blood sugars low than 80 and we discussed about trying to correct only if lower than 70s or 60s.  I also recommended to avoid high glycemic index foods and include healthy fats with meals to avoid blood sugar fluctuations. CGM interpretation: -At today's visit, we reviewed her CGM downloads: It appears that 93% of values are in target range (goal >70%), while 7% are higher than 180 (goal <25%), and 0% are lower than 70 (goal <4%).  The calculated average blood sugar is 135.  The projected HbA1c for the next 3 months (GMI) is 6.5%. -Reviewing the CGM trends, sugars appear to be fluctuating mainly within the target range but she does have some fluctuations during the  day with occasional lower blood sugars in the afternoon to early evening.  We discussed that this visit that low blood sugars usually appear after an initial peak in blood sugars, and I suspect that this is happening as she is trying to correct normal blood sugars with high glycemic index carbs.  However, she is very upset about the blood sugar fluctuations and we discussed that we could try acarbose taken before lunch and maybe 1 or both of the other meals, depending on the effect.  We discussed about the mechanism of action and possible side effects (GI).  We also discussed that we may need to increase the dose depending on the effect. -She and her husband discussed that she may need a second opinion at [facility] and I encouraged her to do that - we checked her HbA1c: 6.6% - slightly higher - advised to check sugars at different times of the day - 4x a day, rotating check times - advised for yearly eye exams >> she is UTD - return to clinic in 6 months  3. Osteopenia + fracture >> clinical osteoporosis - per PCP -Likely age-related + postmenopausal -No falls  or fractures since last visit -She does qualify for pharmaceutical treatment as she has clinical osteoporosis (osteopenia on the bone density scan + history of humeral fracture) -On the last bone density scan she had low femoral neck bone density -She is due for another bone density scan this year -She is currently on Prolia started 03/19/2022. She gets this through the PCPs office.    4.  Vitamin D insufficiency -She had a vitamin D level at the upper limit of normal, 97.5, in the past so we stopped her vitamin D supplement at that time. -She was on 1000 units daily but this was increased to 2000 units daily by PCP as her latest vitamin D level was a little low, 26.89, on 01/15/2023 -she would want her vitamin D level rechecked today  Carlus Pavlov, MD PhD [facility] Endocrinology

## 2023-03-25 NOTE — Patient Instructions (Addendum)
Please continue Prolia.  Try to start: - Acarbose 25 mg before 2-3 meals a day  Please come back for a follow-up appointment in 6 months.  Acarbose Tablets What is this medication? ACARBOSE (AY car bose) treats type 2 diabetes. It works by slowing down the breakdown of carbohydrates from the food you eat. This helps reduce your blood sugar (glucose). Changes to diet and exercise are often combined with this medication. This medicine may be used for other purposes; ask your health care provider or pharmacist if you have questions. COMMON BRAND NAME(S): Precose What should I tell my care team before I take this medication? They need to know if you have any of these conditions: Ketoacidosis Kidney disease Liver disease Stomach or bowel disease, bowel obstruction An unusual or allergic reaction to acarbose, other medications, foods, dyes, or preservatives Pregnant or trying to get pregnant Breast-feeding How should I use this medication? Take this medication by mouth with a glass of water. Follow the directions on the prescription label. Swallow the tablets at the start of a main meal. Take your doses at regular intervals. Do not take your medication more often than directed. Do not stop taking this medication except on the advice of your care team. If you develop severe vomiting or severe diarrhea that prevents you from eating meals, call your care team for advice. Talk to your care team about the use of this medication in children. Special care may be needed. Overdosage: If you think you have taken too much of this medicine contact a poison control center or emergency room at once. NOTE: This medicine is only for you. Do not share this medicine with others. What if I miss a dose? If you forgot your dose at the start of your meal and you are still eating that meal, take your dose while you are still eating. Otherwise, skip the missed dose. This medication is not effective if not taken during  a meal. Wait for your next dose at your next main meal, and take only that dose. Do not take double or extra doses. What may interact with this medication? Charcoal Digestive enzymes like amylase and pancreatin Digoxin Diuretics Female hormones, like estrogens or progestins and birth control pills Isoniazid Medications for colds or breathing difficulties like pseudoephedrine or phenylephrine Medications for high blood pressure called beta-blockers and calcium channel-blockers Nicotinic acid Phenothiazines like chlorpromazine, mesoridazine, prochlorperazine, thioridazine Phenytoin Steroid medications like prednisone or cortisone Thyroid hormones This list may not describe all possible interactions. Give your health care provider a list of all the medicines, herbs, non-prescription drugs, or dietary supplements you use. Also tell them if you smoke, drink alcohol, or use illegal drugs. Some items may interact with your medicine. What should I watch for while using this medication? Visit your care team for regular checks on your progress. A test called the A1C will be monitored. This is a simple blood test. It measures your blood sugar control over the last 2 to 3 months. You will receive this test every 3 to 6 months. Learn how to check your blood sugar. Learn the symptoms of low and high blood sugar and how to manage them. Always carry a quick-source of sugar with you in case you have symptoms of low blood sugar. An example is glucose tablets. Make sure others know that you can choke if you eat or drink when you develop serious symptoms of low blood sugar, such as seizures or unconsciousness. They must get medical help at once.  Tell your care team if you have high blood sugar. You might need to change the dose of your medication. If you are sick or exercising more than usual, you might need to change the dose of your medication. Do not skip meals. Ask your care team if you should avoid alcohol.  Many nonprescription cough and cold products contain sugar or alcohol. These can affect blood sugar. It is important to follow a diabetic diet when taking this medication. This may help decrease some of the side effects like diarrhea, bloating, and gas. If you are following the diet and you still have severe diarrhea or gas, contact your care team. Wear a medical ID bracelet or chain, and carry a card that describes your disease and details of your medication and dosage times. What side effects may I notice from receiving this medication? Side effects that you should report to your care team as soon as possible: Allergic reactions--skin rash, itching, hives, swelling of the face, lips, tongue, or throat Liver injury--right upper belly pain, loss of appetite, nausea, light-colored stool, dark yellow or brown urine, yellowing skin or eyes, unusual weakness or fatigue Side effects that usually do not require medical attention (report to your care team if they continue or are bothersome): Bloating Diarrhea Gas Stomach pain This list may not describe all possible side effects. Call your doctor for medical advice about side effects. You may report side effects to FDA at 1-800-FDA-1088. Where should I keep my medication? Keep out of the reach of children. Store at room temperature below 25 degrees C (77 degrees F). Protect from moisture. Keep container tightly closed. Throw away any unused medication after the expiration date. NOTE: This sheet is a summary. It may not cover all possible information. If you have questions about this medicine, talk to your doctor, pharmacist, or health care provider.  2023 Elsevier/Gold Standard (2021-06-07 00:00:00)

## 2023-04-13 ENCOUNTER — Encounter: Payer: Self-pay | Admitting: Internal Medicine

## 2023-04-14 DIAGNOSIS — N390 Urinary tract infection, site not specified: Secondary | ICD-10-CM | POA: Diagnosis not present

## 2023-04-14 DIAGNOSIS — R350 Frequency of micturition: Secondary | ICD-10-CM | POA: Diagnosis not present

## 2023-04-27 ENCOUNTER — Encounter: Payer: Self-pay | Admitting: Internal Medicine

## 2023-05-05 ENCOUNTER — Other Ambulatory Visit: Payer: Self-pay | Admitting: Internal Medicine

## 2023-05-06 ENCOUNTER — Telehealth: Payer: Self-pay | Admitting: Internal Medicine

## 2023-05-06 ENCOUNTER — Telehealth: Payer: Medicare Other | Admitting: Nurse Practitioner

## 2023-05-06 ENCOUNTER — Encounter: Payer: Self-pay | Admitting: Internal Medicine

## 2023-05-06 DIAGNOSIS — U071 COVID-19: Secondary | ICD-10-CM | POA: Diagnosis not present

## 2023-05-06 MED ORDER — IPRATROPIUM BROMIDE 0.03 % NA SOLN
2.0000 | Freq: Two times a day (BID) | NASAL | 12 refills | Status: DC
Start: 2023-05-06 — End: 2023-07-24

## 2023-05-06 MED ORDER — NIRMATRELVIR/RITONAVIR (PAXLOVID)TABLET
3.0000 | ORAL_TABLET | Freq: Two times a day (BID) | ORAL | 0 refills | Status: AC
Start: 2023-05-06 — End: 2023-05-11

## 2023-05-06 NOTE — Patient Instructions (Addendum)
Stop Lipitor while on Paxlovid may restart 24 hours after finishing anti-viral

## 2023-05-06 NOTE — Telephone Encounter (Signed)
Patient tested positive for Covid 05/05/2023. She would like to know if Paxlovid or another Covid medication can be prescribed and sent to the [facility] on [address] in [place]. Best callback is [phone].

## 2023-05-06 NOTE — Progress Notes (Signed)
Virtual Visit Consent   Hannah Bryant, you are scheduled for a virtual visit with a  provider today. Just as with appointments in the office, your consent must be obtained to participate. Your consent will be active for this visit and any virtual visit you may have with one of our providers in the next 365 days. If you have a MyChart account, a copy of this consent can be sent to you electronically.  As this is a virtual visit, video technology does not allow for your provider to perform a traditional examination. This may limit your provider's ability to fully assess your condition. If your provider identifies any concerns that need to be evaluated in person or the need to arrange testing (such as labs, EKG, etc.), we will make arrangements to do so. Although advances in technology are sophisticated, we cannot ensure that it will always work on either your end or our end. If the connection with a video visit is poor, the visit may have to be switched to a telephone visit. With either a video or telephone visit, we are not always able to ensure that we have a secure connection.  By engaging in this virtual visit, you consent to the provision of healthcare and authorize for your insurance to be billed (if applicable) for the services provided during this visit. Depending on your insurance coverage, you may receive a charge related to this service.  I need to obtain your verbal consent now. Are you willing to proceed with your visit today? Hannah Bryant has provided verbal consent on 05/06/2023 for a virtual visit (video or telephone). Hannah Simas, FNP  Date: 05/06/2023 2:44 PM  Virtual Visit via Video Note   I, Hannah Bryant, connected with  Hannah Bryant  ([id number], 02/22/1946) on 05/06/23 at  3:15 PM EDT by a video-enabled telemedicine application and verified that I am speaking with the correct person using two identifiers.  Location: Patient: Virtual Visit Location Patient:  Home Provider: Virtual Visit Location Provider: Home Office   I discussed the limitations of evaluation and management by telemedicine and the availability of in person appointments. The patient expressed understanding and agreed to proceed.    History of Present Illness: Hannah Bryant is a 77 y.o. who identifies as a female who was assigned female at birth, and is being seen today after testing positive COVID-19.  She took a home test last night and was positive   Symptom onset was yesterday  Symptoms include: low grade fever Deep non productive cough  Headache  Loss of appetite   She has not had COVID in the past She has been vaccinated for COVID with recent booster   She denies a history of respiratory illness or disease  She does have HTN    Problems:  Patient Active Problem List   Diagnosis Date Noted   Neck pain, musculoskeletal 01/07/2022   Allergic rhinitis 07/25/2020   Nausea 10/22/2018   Dizziness 10/22/2018   Vitamin D insufficiency 09/27/2018   Epigastric pain 09/15/2018   Right rotator cuff tear 05/11/2017   Cyst of joint of shoulder 03/04/2017   Left sided abdominal pain 03/25/2016   Type 2 diabetes mellitus with hypoglycemia without coma, without long-term current use of insulin (HCC) 02/28/2016   Reactive hypoglycemia 02/28/2016   Retrocalcaneal bursitis 02/26/2016   Osteoporosis 01/09/2016   Essential hypertension, benign 01/09/2016   Achilles tendinosis 06/22/2015   Junctional nevus of left thigh 12/23/2014   Left rotator cuff tear  arthropathy 08/21/2014   Squamous cell skin cancer 10/06/2013   BENIGN POSITIONAL VERTIGO 08/27/2009   History of colonic polyps 05/09/2008   Gastroparesis 11/15/2007   DIVERTICULOSIS, COLON 11/15/2007   GASTROESOPHAGEAL REFLUX DISEASE, SEVERE 05/12/2007   Nonspecific abnormal electrocardiogram (ECG) (EKG) 05/12/2007   Hyperlipidemia 05/07/2007    Allergies:  Allergies  Allergen Reactions   Domperidone      Stomach cramps   Fentanyl Nausea And Vomiting   Monistat [Miconazole Nitrate]     SEVERE BURNING   Nsaids     REACTION: stomach complaints   Oxycodone Hcl     REACTION: VOMITTING   Codeine     nausea   Erythromycin Ethylsuccinate     gastritis   Medications:  Current Outpatient Medications:    acarbose (PRECOSE) 25 MG tablet, Take 1 tablet by mouth before 2-3 meals a day, Disp: 90 tablet, Rfl: 3   acetaminophen (TYLENOL ARTHRITIS PAIN) 650 MG CR tablet, Take 650 mg by mouth. 2 by mouth 2 times daily, Disp: , Rfl:    aspirin 81 MG chewable tablet, Chew 81 mg by mouth daily., Disp: , Rfl:    atorvastatin (LIPITOR) 40 MG tablet, TAKE 1 TABLET BY MOUTH EVERY DAY Return in about 6 months (around 07/15/2023) for follow up., Disp: 90 tablet, Rfl: 0   Blood Glucose Monitoring Suppl (FREESTYLE FREEDOM LITE) w/Device KIT, Use as instructed to check blood sugar 4 times daily., Disp: 1 kit, Rfl: 0   Calcium-Vitamin D-Vitamin K (VIACTIV PO), Take by mouth. Reported on 02/28/2016, Disp: , Rfl:    Continuous Blood Gluc Receiver (FREESTYLE LIBRE 2 READER) DEVI, USE AS DIRECTED, Disp: 1 each, Rfl: 3   Continuous Blood Gluc Sensor (FREESTYLE LIBRE 2 SENSOR) MISC, 1 each by Does not apply route every 14 (fourteen) days., Disp: 6 each, Rfl: 3   Continuous Blood Gluc Sensor (FREESTYLE LIBRE 3 SENSOR) MISC, Place 1 sensor on the skin every 14 days. Use to check glucose continuously, Disp: 2 each, Rfl: 2   Continuous Glucose Receiver (FREESTYLE LIBRE 3 READER) DEVI, USE AS DIRECTED, Disp: 1 each, Rfl: 0   Cyanocobalamin (B-12) 1000 MCG TABS, Take 1,000 mcg by mouth., Disp: , Rfl:    denosumab (PROLIA) 60 MG/ML SOSY injection, Inject 60 mg into the skin every 6 (six) months., Disp: , Rfl:    esomeprazole (NEXIUM) 40 MG capsule, Take 1 capsule (40 mg total) by mouth daily. --- Needs office visit before further refills   , Disp: 30 capsule, Rfl: 2   fexofenadine (ALLEGRA) 180 MG tablet, Take 180 mg by mouth daily.,  Disp: , Rfl:    glucose blood (FREESTYLE TEST STRIPS) test strip, FreeStyle Precision Neo test strips.  Use as instructed to check blood sugar 1 time a day as a back up for the Freestyle Libre CGM, Disp: 100 each, Rfl: 12   glucose blood test strip, Freestyle freedom test strip Use as instructed to check sugars E11.9, Disp: 50 each, Rfl: 12   hydrochlorothiazide (MICROZIDE) 12.5 MG capsule, Take 1 capsule (12.5 mg total) by mouth daily. Per MD Return in about 6 months (around 07/15/2023) for follow up., Disp: 90 capsule, Rfl: 0   hyoscyamine (LEVSIN SL) 0.125 MG SL tablet, DISSOLVE 1-2 TABLETS UNDER TONGUE EVERY 4-6 HOURS AS NEEDED FOR ABDOMINAL PAIN AND CRAMPING, Disp: 900 tablet, Rfl: 3   Lancets MISC, by Does not apply route. Lifespan ultra 2 test strips and lancets, Disp: , Rfl:    Meclizine HCl (BONINE PO), Take by  mouth., Disp: , Rfl:    montelukast (SINGULAIR) 10 MG tablet, TAKE 1 TABLET BY MOUTH EVERY DAY AS NEEDED Return in about 6 months (around 07/15/2023) for follow up., Disp: 90 tablet, Rfl: 0   ondansetron (ZOFRAN ODT) 4 MG disintegrating tablet, Take 1 tablet (4 mg total) by mouth every 8 (eight) hours as needed for nausea or vomiting., Disp: 30 tablet, Rfl: 0   OVER THE COUNTER MEDICATION, multistrain probiotic, Disp: , Rfl:    VITAMIN D, CHOLECALCIFEROL, PO, Take by mouth., Disp: , Rfl:   Observations/Objective: Patient is well-developed, well-nourished in no acute distress.  Resting comfortably  at home.  Head is normocephalic, atraumatic.  No labored breathing.  Speech is clear and coherent with logical content.  Patient is alert and oriented at baseline.    Assessment and Plan:  1. COVID-19  Stop Lipitor while on Paxlovid may restart 24 hours after finishing anti-viral  - nirmatrelvir/ritonavir (PAXLOVID) 20 x 150 MG & 10 x 100MG  TABS; Take 3 tablets by mouth 2 (two) times daily for 5 days. (Take nirmatrelvir 150 mg two tablets twice daily for 5 days and ritonavir 100 mg  one tablet twice daily for 5 days) Patient GFR is 75  Dispense: 30 tablet; Refill: 0 - ipratropium (ATROVENT) 0.03 % nasal spray; Place 2 sprays into both nostrils every 12 (twelve) hours.  Dispense: 30 mL; Refill: 12     Follow Up Instructions: I discussed the assessment and treatment plan with the patient. The patient was provided an opportunity to ask questions and all were answered. The patient agreed with the plan and demonstrated an understanding of the instructions.  A copy of instructions were sent to the patient via MyChart unless otherwise noted below.    The patient was advised to call back or seek an in-person evaluation if the symptoms worsen or if the condition fails to improve as anticipated.  Time:  I spent 15 minutes with the patient via telehealth technology discussing the above problems/concerns.    Hannah Simas, FNP

## 2023-05-07 ENCOUNTER — Other Ambulatory Visit: Payer: Self-pay | Admitting: Internal Medicine

## 2023-05-07 NOTE — Telephone Encounter (Signed)
Looks like she did a VV and was started on paxlovid yesterday.

## 2023-05-08 ENCOUNTER — Other Ambulatory Visit: Payer: Self-pay

## 2023-05-08 MED ORDER — FREESTYLE LIBRE 3 SENSOR MISC: 2 refills | Status: DC

## 2023-05-12 ENCOUNTER — Other Ambulatory Visit (INDEPENDENT_AMBULATORY_CARE_PROVIDER_SITE_OTHER): Payer: Medicare Other

## 2023-05-12 ENCOUNTER — Encounter: Payer: Self-pay | Admitting: Internal Medicine

## 2023-05-12 DIAGNOSIS — R350 Frequency of micturition: Secondary | ICD-10-CM

## 2023-05-12 LAB — URINALYSIS, ROUTINE W REFLEX MICROSCOPIC
Bilirubin Urine: NEGATIVE
Hgb urine dipstick: NEGATIVE
Nitrite: NEGATIVE
Specific Gravity, Urine: 1.015 (ref 1.000–1.030)
Total Protein, Urine: NEGATIVE
Urine Glucose: 100 — AB
Urobilinogen, UA: 1 (ref 0.0–1.0)
pH: 6 (ref 5.0–8.0)

## 2023-05-12 MED ORDER — CEPHALEXIN 500 MG PO CAPS: 500.0000 mg | ORAL_CAPSULE | Freq: Two times a day (BID) | ORAL | 0 refills | Status: DC

## 2023-05-12 NOTE — Addendum Note (Signed)
Addended by: Pincus Sanes on: 05/12/2023 07:28 PM   Modules accepted: Orders

## 2023-05-15 LAB — URINE CULTURE

## 2023-06-12 ENCOUNTER — Encounter (HOSPITAL_COMMUNITY)
Admission: AD | Disposition: A | Discharge status: Home / Self Care | Payer: Self-pay | Source: Ambulatory Visit | Attending: Gastroenterology

## 2023-06-12 ENCOUNTER — Encounter (HOSPITAL_COMMUNITY): Payer: Self-pay | Admitting: Gastroenterology

## 2023-06-12 ENCOUNTER — Ambulatory Visit (HOSPITAL_COMMUNITY)
Admission: AD | Admit: 2023-06-12 | Discharge: 2023-06-12 | Disposition: A | Discharge status: Home / Self Care | Payer: Medicare Other | Source: Ambulatory Visit | Attending: Gastroenterology | Admitting: Gastroenterology

## 2023-06-12 ENCOUNTER — Emergency Department (HOSPITAL_BASED_OUTPATIENT_CLINIC_OR_DEPARTMENT_OTHER): Payer: Medicare Other | Admitting: Radiology

## 2023-06-12 ENCOUNTER — Emergency Department (HOSPITAL_BASED_OUTPATIENT_CLINIC_OR_DEPARTMENT_OTHER)
Admission: EM | Admit: 2023-06-12 | Discharge: 2023-06-12 | Disposition: A | Discharge status: Home / Self Care | Payer: Medicare Other | Source: Home / Self Care | Attending: Emergency Medicine | Admitting: Emergency Medicine

## 2023-06-12 ENCOUNTER — Other Ambulatory Visit: Payer: Self-pay

## 2023-06-12 DIAGNOSIS — Z7982 Long term (current) use of aspirin: Secondary | ICD-10-CM | POA: Insufficient documentation

## 2023-06-12 DIAGNOSIS — Z9889 Other specified postprocedural states: Secondary | ICD-10-CM | POA: Diagnosis not present

## 2023-06-12 DIAGNOSIS — W44F3XA Food entering into or through a natural orifice, initial encounter: Secondary | ICD-10-CM | POA: Insufficient documentation

## 2023-06-12 DIAGNOSIS — R1319 Other dysphagia: Secondary | ICD-10-CM | POA: Diagnosis present

## 2023-06-12 DIAGNOSIS — E785 Hyperlipidemia, unspecified: Secondary | ICD-10-CM | POA: Insufficient documentation

## 2023-06-12 DIAGNOSIS — R1310 Dysphagia, unspecified: Secondary | ICD-10-CM | POA: Diagnosis not present

## 2023-06-12 DIAGNOSIS — K219 Gastro-esophageal reflux disease without esophagitis: Secondary | ICD-10-CM | POA: Diagnosis not present

## 2023-06-12 DIAGNOSIS — T18128A Food in esophagus causing other injury, initial encounter: Secondary | ICD-10-CM

## 2023-06-12 DIAGNOSIS — R918 Other nonspecific abnormal finding of lung field: Secondary | ICD-10-CM | POA: Diagnosis not present

## 2023-06-12 DIAGNOSIS — K2289 Other specified disease of esophagus: Secondary | ICD-10-CM | POA: Diagnosis not present

## 2023-06-12 DIAGNOSIS — E119 Type 2 diabetes mellitus without complications: Secondary | ICD-10-CM | POA: Insufficient documentation

## 2023-06-12 DIAGNOSIS — Z79899 Other long term (current) drug therapy: Secondary | ICD-10-CM | POA: Diagnosis not present

## 2023-06-12 DIAGNOSIS — X58XXXA Exposure to other specified factors, initial encounter: Secondary | ICD-10-CM | POA: Insufficient documentation

## 2023-06-12 HISTORY — PX: FOREIGN BODY REMOVAL: SHX962

## 2023-06-12 HISTORY — PX: ESOPHAGOGASTRODUODENOSCOPY (EGD) WITH PROPOFOL: SHX5813

## 2023-06-12 LAB — COMPREHENSIVE METABOLIC PANEL
ALT: 16 U/L (ref 0–44)
AST: 19 U/L (ref 15–41)
Albumin: 4.4 g/dL (ref 3.5–5.0)
Alkaline Phosphatase: 48 U/L (ref 38–126)
Anion gap: 12 (ref 5–15)
BUN: 21 mg/dL (ref 8–23)
CO2: 29 mmol/L (ref 22–32)
Calcium: 9.5 mg/dL (ref 8.9–10.3)
Chloride: 101 mmol/L (ref 98–111)
Creatinine, Ser: 0.66 mg/dL (ref 0.44–1.00)
GFR, Estimated: >60 mL/min (ref >60)
Glucose, Bld: 89 mg/dL (ref 70–99)
Potassium: 3.9 mmol/L (ref 3.5–5.1)
Sodium: 142 mmol/L (ref 135–145)
Total Bilirubin: 0.7 mg/dL (ref 0.3–1.2)
Total Protein: 7.1 g/dL (ref 6.5–8.1)

## 2023-06-12 LAB — GLUCOSE, CAPILLARY
Glucose-Capillary: 80 mg/dL (ref 70–99)
Glucose-Capillary: 82 mg/dL (ref 70–99)

## 2023-06-12 LAB — CBC WITH DIFFERENTIAL/PLATELET
Abs Immature Granulocytes: 0.01 10*3/uL (ref 0.00–0.07)
Basophils Absolute: 0 10*3/uL (ref 0.0–0.1)
Basophils Relative: 0 %
Eosinophils Absolute: 0.1 10*3/uL (ref 0.0–0.5)
Eosinophils Relative: 1 %
HCT: 42.5 % (ref 36.0–46.0)
Hemoglobin: 14.4 g/dL (ref 12.0–15.0)
Immature Granulocytes: 0 %
Lymphocytes Relative: 24 %
Lymphs Abs: 1.7 10*3/uL (ref 0.7–4.0)
MCH: 32.3 pg (ref 26.0–34.0)
MCHC: 33.9 g/dL (ref 30.0–36.0)
MCV: 95.3 fL (ref 80.0–100.0)
Monocytes Absolute: 0.5 10*3/uL (ref 0.1–1.0)
Monocytes Relative: 7 %
Neutro Abs: 4.8 10*3/uL (ref 1.7–7.7)
Neutrophils Relative %: 68 %
Platelets: 142 10*3/uL — ABNORMAL LOW (ref 150–400)
RBC: 4.46 MIL/uL (ref 3.87–5.11)
RDW: 12.9 % (ref 11.5–15.5)
WBC: 7 10*3/uL (ref 4.0–10.5)
nRBC: 0 % (ref 0.0–0.2)

## 2023-06-12 SURGERY — ESOPHAGOGASTRODUODENOSCOPY (EGD) WITH PROPOFOL
Anesthesia: Moderate Sedation

## 2023-06-12 MED ORDER — DIPHENHYDRAMINE HCL 50 MG/ML IJ SOLN
INTRAMUSCULAR | Status: DC | PRN
Start: 2023-06-12 — End: 2023-06-12
  Administered 2023-06-12: 25 mg via INTRAVENOUS

## 2023-06-12 MED ORDER — LACTATED RINGERS IV SOLN
INTRAVENOUS | Status: DC
  Administered 2023-06-12: 1000 mL via INTRAVENOUS

## 2023-06-12 MED ORDER — SODIUM CHLORIDE 0.9 % IV SOLN: INTRAVENOUS | Status: DC

## 2023-06-12 MED ORDER — ONDANSETRON HCL 4 MG/2ML IJ SOLN
INTRAMUSCULAR | Status: AC
  Filled 2023-06-12: qty 2

## 2023-06-12 MED ORDER — FENTANYL CITRATE (PF) 100 MCG/2ML IJ SOLN
INTRAMUSCULAR | Status: DC | PRN
  Administered 2023-06-12 (×6): 25 ug via INTRAVENOUS

## 2023-06-12 MED ORDER — MIDAZOLAM HCL (PF) 5 MG/ML IJ SOLN
INTRAMUSCULAR | Status: AC
  Filled 2023-06-12: qty 1

## 2023-06-12 MED ORDER — FENTANYL CITRATE (PF) 100 MCG/2ML IJ SOLN
INTRAMUSCULAR | Status: AC
  Filled 2023-06-12: qty 2

## 2023-06-12 MED ORDER — ONDANSETRON HCL 4 MG/2ML IJ SOLN
4.0000 mg | Freq: Once | INTRAMUSCULAR | Status: AC
  Administered 2023-06-12: 4 mg via INTRAVENOUS

## 2023-06-12 MED ORDER — GLUCAGON HCL RDNA (DIAGNOSTIC) 1 MG IJ SOLR
1.0000 mg | Freq: Once | INTRAMUSCULAR | Status: AC
  Administered 2023-06-12: 1 mg via INTRAVENOUS
  Filled 2023-06-12: qty 1

## 2023-06-12 MED ORDER — ONDANSETRON HCL 4 MG/2ML IJ SOLN
4.0000 mg | Freq: Once | INTRAMUSCULAR | Status: DC
  Filled 2023-06-12: qty 2

## 2023-06-12 MED ORDER — MIDAZOLAM HCL (PF) 5 MG/ML IJ SOLN
INTRAMUSCULAR | Status: DC | PRN
  Administered 2023-06-12: 1 mg via INTRAVENOUS
  Administered 2023-06-12 (×2): 2 mg via INTRAVENOUS
  Administered 2023-06-12: 1 mg via INTRAVENOUS
  Administered 2023-06-12 (×2): 2 mg via INTRAVENOUS

## 2023-06-12 MED ORDER — LACTATED RINGERS IV BOLUS
500.0000 mL | Freq: Once | INTRAVENOUS | Status: AC
  Administered 2023-06-12: 500 mL via INTRAVENOUS

## 2023-06-12 MED ORDER — DIPHENHYDRAMINE HCL 50 MG/ML IJ SOLN
INTRAMUSCULAR | Status: AC
  Filled 2023-06-12: qty 1

## 2023-06-12 SURGICAL SUPPLY — 15 items

## 2023-06-12 NOTE — Progress Notes (Signed)
Received call from [employer] regarding this patent of Dr Russella Dar. Patient of Dr. Russella Dar . Patient in ED with a food impaction. Had a slider on Wed and feels it is stuck in esophagus. Not able to tolerate fluids x 2 days. She has a history of prior esophageal dilations but none in recent years  CMET pending to make sure no AKI / electrolyte abnormalities. If labs okay then she will be discharged from the ED and will go to [facility] registration department to get registered for endoscopy  Not anticoagulated.

## 2023-06-12 NOTE — ED Provider Notes (Signed)
[facility] Provider Note   CSN: [id number] Arrival date & time: 06/12/23  1121     History {Add pertinent medical, surgical, social history, OB history to HPI:1} Chief Complaint  Patient presents with   Dysphagia    Hannah Bryant is a 77 y.o. female.  HPI      Hiatal hernia 20 years ago, and every now and then something will get caught and not get through  Wednesday night had slider and "it didn't slide" Swallow and goes down about half way and then it comes back up, regurgitating. Wednesday did spit up a chunk of meat. Tried eating saltines which came back up.  Tried gingerale, water.  Tried hot wanter in the past to help it move through but it didn't help this time.  Is spitting up saliva at times thinks after it builds up, has not been able to tolerate eating or drinking anything for 2 days  [facility] maybe 5 years ago, def less than 10 Has had similar symptoms in the past Has had esophageal dilations but not food impactions that required ED visits-in past would have this sensation but then it would get better Has also not been able to take meds for last 2 days Feeling weak because cannot eat or drink for 2 days No chest pain, dyspnea, nausea or vomiting, no abd pain  Home Medications Prior to Admission medications   Medication Sig Start Date End Date Taking? Authorizing Provider  acarbose (PRECOSE) 25 MG tablet Take 1 tablet by mouth before 2-3 meals a day 03/25/23   Carlus Pavlov, MD  acetaminophen (TYLENOL ARTHRITIS PAIN) 650 MG CR tablet Take 650 mg by mouth. 2 by mouth 2 times daily    [provider]  aspirin 81 MG chewable tablet Chew 81 mg by mouth daily.    [provider]  atorvastatin (LIPITOR) 40 MG tablet TAKE 1 TABLET BY MOUTH EVERY DAY Return in about 6 months (around 07/15/2023) for follow up. 05/05/23   Pincus Sanes, MD  Blood Glucose Monitoring Suppl (FREESTYLE FREEDOM LITE) w/Device  KIT Use as instructed to check blood sugar 4 times daily. 02/04/21   Carlus Pavlov, MD  Calcium-Vitamin D-Vitamin K (VIACTIV PO) Take by mouth. Reported on 02/28/2016    [provider]  cephALEXin (KEFLEX) 500 MG capsule Take 1 capsule (500 mg total) by mouth 2 (two) times daily. 05/12/23   Pincus Sanes, MD  Continuous Blood Gluc Receiver (FREESTYLE LIBRE 2 READER) DEVI USE AS DIRECTED 09/16/22   Carlus Pavlov, MD  Continuous Blood Gluc Sensor (FREESTYLE LIBRE 2 SENSOR) MISC 1 each by Does not apply route every 14 (fourteen) days. 09/16/22   Carlus Pavlov, MD  Continuous Glucose Receiver (FREESTYLE LIBRE 3 READER) DEVI USE AS DIRECTED 05/05/23   Carlus Pavlov, MD  Continuous Glucose Sensor (FREESTYLE LIBRE 3 SENSOR) MISC Use to check glucose daily 05/08/23   Carlus Pavlov, MD  Cyanocobalamin (B-12) 1000 MCG TABS Take 1,000 mcg by mouth.    [provider]  denosumab (PROLIA) 60 MG/ML SOSY injection Inject 60 mg into the skin every 6 (six) months.    [provider]  esomeprazole (NEXIUM) 40 MG capsule Take 1 capsule (40 mg total) by mouth daily. --- Needs office visit before further refills    10/01/15   Pecola Lawless, MD  fexofenadine (ALLEGRA) 180 MG tablet Take 180 mg by mouth daily.    [provider]  glucose blood (FREESTYLE  TEST STRIPS) test strip FreeStyle Precision Neo test strips.  Use as instructed to check blood sugar 1 time a day as a back up for the Freestyle Libre CGM 01/07/22   Pincus Sanes, MD  glucose blood test strip Freestyle freedom test strip Use as instructed to check sugars E11.9 01/07/22   Pincus Sanes, MD  hydrochlorothiazide (MICROZIDE) 12.5 MG capsule Take 1 capsule (12.5 mg total) by mouth daily. Per MD Return in about 6 months (around 07/15/2023) for follow up. 05/05/23   Burns, Bobette Mo, MD  hyoscyamine (LEVSIN SL) 0.125 MG SL tablet DISSOLVE 1-2 TABLETS UNDER TONGUE EVERY 4-6 HOURS AS NEEDED FOR ABDOMINAL PAIN AND  CRAMPING 11/11/22   Pincus Sanes, MD  ipratropium (ATROVENT) 0.03 % nasal spray Place 2 sprays into both nostrils every 12 (twelve) hours. 05/06/23   [name], FNP  Lancets MISC by Does not apply route. Lifespan ultra 2 test strips and lancets    [provider]  Meclizine HCl (BONINE PO) Take by mouth.    [provider]  montelukast (SINGULAIR) 10 MG tablet TAKE 1 TABLET BY MOUTH EVERY DAY AS NEEDED Return in about 6 months (around 07/15/2023) for follow up. 05/05/23   Pincus Sanes, MD  ondansetron (ZOFRAN ODT) 4 MG disintegrating tablet Take 1 tablet (4 mg total) by mouth every 8 (eight) hours as needed for nausea or vomiting. 01/14/23   Burns, Bobette Mo, MD  OVER THE COUNTER MEDICATION multistrain probiotic    [provider]  VITAMIN D, CHOLECALCIFEROL, PO Take by mouth.    [provider]      Allergies    Domperidone, Fentanyl, Monistat [miconazole nitrate], Nsaids, Oxycodone hcl, Codeine, and Erythromycin ethylsuccinate    Review of Systems   Review of Systems  Physical Exam Updated Vital Signs BP (!) 186/101 (BP Location: Right Arm)   Pulse 85   Temp 97.9 F (36.6 C)   Resp 18   Ht 5\' 3"  (1.6 m)   Wt 63.5 kg   LMP 11/18/2000   SpO2 91%   BMI 24.80 kg/m  Physical Exam  ED Results / Procedures / Treatments   Labs (all labs ordered are listed, but only abnormal results are displayed) Labs Reviewed - No data to display  EKG None  Radiology No results found.  Procedures Procedures  {Document cardiac monitor, telemetry assessment procedure when appropriate:1}  Medications Ordered in ED Medications - No data to display  ED Course/ Medical Decision Making/ A&P   {   Click here for ABCD2, HEART and other calculatorsREFRESH Note before signing :1}                          Medical Decision Making Amount and/or Complexity of Data Reviewed Radiology: ordered.   ***  {Document critical care time when  appropriate:1} {Document review of labs and clinical decision tools ie heart score, Chads2Vasc2 etc:1}  {Document your independent review of radiology images, and any outside records:1} {Document your discussion with family members, caretakers, and with consultants:1} {Document social determinants of health affecting pt's care:1} {Document your decision making why or why not admission, treatments were needed:1} Final Clinical Impression(s) / ED Diagnoses Final diagnoses:  None    Rx / DC Orders ED Discharge Orders     None

## 2023-06-12 NOTE — ED Notes (Signed)
Pt give ~ 45cc water for po challenge

## 2023-06-12 NOTE — Op Note (Signed)
[facility] Patient Name: Hannah Bryant Procedure Date: 06/12/2023 MRN: [id number] Attending MD: Meryl Dare , MD, [id number] Date of Birth: September 27, 1946 CSN: [id number] Age: 77 Admit Type: Outpatient Procedure:                Upper GI endoscopy Indications:              Dysphagia, Foreign body in the esophagus,                            Esophageal food impaction, Unable to swallow since                            having slider on Wednesday evening. Providers:                Venita Lick. Russella Dar, MD, Stephens Shire RN, RN, Cephus Richer, RN, Priscella Mann, Technician Referring MD:             Pincus Sanes, MD Medicines:                Fentanyl 150 micrograms IV, Midazolam 10 mg IV,                            Diphenhydramine 25 mg IV Complications:            No immediate complications. Estimated Blood Loss:     Estimated blood loss was minimal. Procedure:                Pre-Anesthesia Assessment:                           - Prior to the procedure, a History and Physical                            was performed, and patient medications and                            allergies were reviewed. The patient's tolerance of                            previous anesthesia was also reviewed. The risks                            and benefits of the procedure and the sedation                            options and risks were discussed with the patient.                            All questions were answered, and informed consent                            was obtained. Prior Anticoagulants: The patient has  taken no anticoagulant or antiplatelet agents. ASA                            Grade Assessment: II - A patient with mild systemic                            disease. After reviewing the risks and benefits,                            the patient was deemed in satisfactory condition to                            undergo the  procedure.                           After obtaining informed consent, the endoscope was                            passed under direct vision. Throughout the                            procedure, the patient's blood pressure, pulse, and                            oxygen saturations were monitored continuously. The                            [id number]-[id number] ([id number]) Olympus endoscope was introduced                            through the mouth, and advanced to the second part                            of duodenum. The upper GI endoscopy was somewhat                            difficult due to presence of food. The patient                            tolerated the procedure well. Scope In: Scope Out: Findings:      The lumen of the mid esophagus and distal esophagus was moderately       dilated.      Food moderate volume was found in the mid esophagus and in the distal       esophagus. The majority of the removal was accomplished with a Roth net       and once adequate visualization was established the remaining solids       were gently advanced into the stomach.      Localized moderate erythema was found in the distal esophagus at the       site of the worst part of the impaction.      Evidence of a Nissen fundoplication was found in the cardia and in the       gastric fundus. The wrap appeared intact. This was traversed.  The exam of the stomach was otherwise normal.      The duodenal bulb and second portion of the duodenum were normal. Impression:               - Dilation in the mid esophagus and in the distal                            esophagus.                           - Food in the mid esophagus and in the distal                            esophagus. Removal was successful.                           - Erythema in the distal esophagus.                           - A Nissen fundoplication was found. The wrap                            appears intact.                           -  Normal duodenal bulb and second portion of the                            duodenum. Moderate Sedation:      Moderate (conscious) sedation was administered by the nurse and       supervised by the endoscopist. The following parameters were monitored:       oxygen saturation, heart rate, blood pressure, respiratory rate, EKG,       adequacy of pulmonary ventilation, and response to care. Total physician       intraservice time was 58 minutes. Recommendation:           - Patient has a contact number available for                            emergencies. The signs and symptoms of potential                            delayed complications were discussed with the                            patient. Return to normal activities tomorrow.                            Written discharge instructions were provided to the                            patient.                           - Clear liquid diet today.                           -  Advance to full liquids Saturday and "Sunday.                           - Advance to a soft diet Monday.                           - Continue present medications.                           - Schedule barium esophagram. Procedure Code(s):        --- Professional ---                           43247, Esophagogastroduodenoscopy, flexible,                            transoral; with removal of foreign body(s)                           99152, 59, Moderate sedation services provided by                            the same physician or other qualified health care                            professional performing the diagnostic or                            therapeutic service that the sedation supports,                            requiring the presence of an independent trained                            observer to assist in the monitoring of the                            patient's level of consciousness and physiological                            status; initial 15 minutes of  intraservice time,                            patient age 5 years or older                           99153, Moderate sedation; each additional 15                            minutes intraservice time                           99" 153, Moderate sedation; each additional 15                            minutes intraservice  time                           [phone], Moderate sedation; each additional 15                            minutes intraservice time Diagnosis Code(s):        --- Professional ---                           K22.89, Other specified disease of esophagus                           T18.128A, Food in esophagus causing other injury,                            initial encounter                           Z98.890, Other specified postprocedural states                           R13.10, Dysphagia, unspecified                           T18.108A, Unspecified foreign body in esophagus                            causing other injury, initial encounter CPT copyright 2022 [employer]. All rights reserved. The codes documented in this report are preliminary and upon coder review may  be revised to meet current compliance requirements. Meryl Dare, MD 06/12/2023 5:18:31 PM This report has been signed electronically. Number of Addenda: 0

## 2023-06-12 NOTE — Discharge Instructions (Signed)

## 2023-06-12 NOTE — ED Notes (Signed)
ED Provider at bedside. 

## 2023-06-12 NOTE — ED Triage Notes (Signed)
Pt presents pov with complaint of the food bolus being "stuck". Pt states the she had a slider on Wednesday when it got stuck. She states she has not being able to eat, or drink or take her meds since then. Pt report hx of the same.

## 2023-06-12 NOTE — Progress Notes (Signed)
Dr. Russella Dar spoke with patient and consent, pt in agreeable. Consent signed by Dr. Russella Dar and nurse was having patient sign and BP was taking and was painful, nurse signed as witness and did not return consent to patient to sign. 3 nurses present while Dr. Russella Dar discussing procedure and can confirm patient consented to procedure verbally. KMB

## 2023-06-12 NOTE — ED Notes (Signed)
Patient politely refused zofran at this time. Glucagon administered as ordered.

## 2023-06-12 NOTE — ED Notes (Signed)
Pt reports emesis. Watery emesis noted

## 2023-06-12 NOTE — H&P (Signed)
See Dr. Isabelle Course H&P from today, no changes.

## 2023-06-12 NOTE — Discharge Instructions (Signed)
Go to registration, register for Endoscopy at [facility].

## 2023-06-15 ENCOUNTER — Encounter (HOSPITAL_COMMUNITY): Payer: Self-pay | Admitting: Gastroenterology

## 2023-06-15 ENCOUNTER — Telehealth: Payer: Self-pay | Admitting: Gastroenterology

## 2023-06-15 DIAGNOSIS — R1310 Dysphagia, unspecified: Secondary | ICD-10-CM

## 2023-06-15 NOTE — Telephone Encounter (Signed)
Per the procedure report the barium swallow has been ordered and sent to the schedulers.  The pt has been advised that she should expect a call in the next week. She should call if she has not heard from them in about a week.

## 2023-06-15 NOTE — Telephone Encounter (Signed)
Inbound call from patient wanting to schedule a barium swallow test.Please advise.

## 2023-06-17 ENCOUNTER — Telehealth: Payer: Self-pay | Admitting: Gastroenterology

## 2023-06-17 NOTE — Telephone Encounter (Signed)
Inbound call from patient stating she is still having a very hard time swallowing. States she chewed her food very well but still had a lot of trouble swallowing. Patient would like a call back to discuss if she should schedule swallow test sooner. Please advise, thank you.

## 2023-06-17 NOTE — Telephone Encounter (Signed)
The pt has been given the number for the schedulers to get sooner barium swallow.  She will call back if she has any further questions.

## 2023-06-18 ENCOUNTER — Other Ambulatory Visit: Payer: Self-pay | Admitting: Internal Medicine

## 2023-06-18 ENCOUNTER — Telehealth: Payer: Self-pay

## 2023-06-18 NOTE — Telephone Encounter (Signed)
Transition Care Management Follow-up Telephone Call Date of discharge and from where: 06/12/2023 [facility] How have you been since you were released from the hospital? Patient is on soft diet, feeling nauseated from the fentanyl she was given. Patient stated she was given fentanyl and it is on her list of drugs she is allergic to. Any questions or concerns? No  Items Reviewed: Did the pt receive and understand the discharge instructions provided? Yes  Medications obtained and verified?  No medication prescribed. Other? No  Any new allergies since your discharge?  Patient stated she received fentanyl which is on her list of drugs she is allergic to. Dietary orders reviewed? Yes Do you have support at home? Yes   Follow up appointments reviewed:  PCP Hospital f/u appt confirmed? No  Scheduled to see  on  @ . Specialist Hospital f/u appt confirmed? Yes  Scheduled to see Barium Swallow on 06/24/2023 @ [facility]. Are transportation arrangements needed? No  If their condition worsens, is the pt aware to call PCP or go to the Emergency Dept.? Yes Was the patient provided with contact information for the PCP's office or ED? Yes Was to pt encouraged to call back with questions or concerns? Yes  Filip Luten [facility]  [facility] Population Health Community Resource Care Guide   [email or web address]  ?? [phone]   Website: [email or web address]  [email or web address]

## 2023-06-24 ENCOUNTER — Ambulatory Visit (HOSPITAL_COMMUNITY)
Admission: RE | Admit: 2023-06-24 | Discharge: 2023-06-24 | Disposition: A | Discharge status: Home / Self Care | Payer: Medicare Other | Source: Ambulatory Visit | Attending: Gastroenterology | Admitting: Gastroenterology

## 2023-06-24 DIAGNOSIS — K219 Gastro-esophageal reflux disease without esophagitis: Secondary | ICD-10-CM | POA: Diagnosis not present

## 2023-06-24 DIAGNOSIS — R1310 Dysphagia, unspecified: Secondary | ICD-10-CM | POA: Diagnosis not present

## 2023-06-24 DIAGNOSIS — Z9049 Acquired absence of other specified parts of digestive tract: Secondary | ICD-10-CM | POA: Diagnosis not present

## 2023-06-24 DIAGNOSIS — K224 Dyskinesia of esophagus: Secondary | ICD-10-CM | POA: Diagnosis not present

## 2023-06-25 ENCOUNTER — Telehealth: Payer: Self-pay

## 2023-06-25 NOTE — Telephone Encounter (Signed)
-----   Message from [place] T. Russella Dar sent at 06/25/2023  9:34 AM EDT ----- Regarding: FW: Esophageal dysmotility with barium tablet hold up due to dysmotility and prior fundoplication. Adjust diet to avoid foods that lead to dysphagia and to carefully chew all solid foods ----- Message ----- From: Interface, Rad Results In Sent: 06/24/2023  10:07 AM EDT To: Meryl Dare, MD

## 2023-06-25 NOTE — Telephone Encounter (Signed)
The pt has been advised of the results via My Chart. She does view her messages.

## 2023-06-29 NOTE — Telephone Encounter (Signed)
Inbound call from patient requesting to speak with a nurse in regards to results. Patient states she does not understand the MyChart message that was sent. Would like to be further advised. Also scheduled patient for ov in October, requesting sooner apt. Please advise.   Thank you

## 2023-06-29 NOTE — Telephone Encounter (Signed)
I spoke with the pt and we discussed the results and recommendations. She will eat soft foods and chew well. She requested an appt to see and speak with Dr Hannah Bryant so the appt was made prior to being transferred to me. She will keep that appt and call to see if we have any cancellations in the meantime

## 2023-07-15 ENCOUNTER — Encounter: Payer: Self-pay | Admitting: Internal Medicine

## 2023-07-16 ENCOUNTER — Ambulatory Visit (HOSPITAL_COMMUNITY): Payer: Medicare Other

## 2023-07-21 ENCOUNTER — Other Ambulatory Visit: Payer: Self-pay | Admitting: Internal Medicine

## 2023-07-21 ENCOUNTER — Telehealth: Payer: Self-pay

## 2023-07-21 NOTE — Telephone Encounter (Signed)
Patient needs PA for FreeStyle [place] 3

## 2023-07-21 NOTE — Telephone Encounter (Signed)
Name from pharmacy: FREESTYLE LIBRE 3 SENSOR        Will file in chart as: Continuous Glucose Sensor (FREESTYLE LIBRE 3 SENSOR) MISC   Sig: USE TO CHECK BLOOD GLUCOSE DAILY   Disp: 2 each    Refills: 2   Start: 07/21/2023   Class: Normal   Non-formulary   Last ordered: 2 months ago (05/08/2023) by Carlus Pavlov, MD   Last refill: 07/21/2023   Rx #: [id number]   Pharmacy comment: Alternative Requested:PRIOR AUTH REQUIRED.

## 2023-07-22 ENCOUNTER — Other Ambulatory Visit (HOSPITAL_COMMUNITY): Payer: Self-pay

## 2023-07-22 ENCOUNTER — Telehealth: Payer: Self-pay

## 2023-07-22 NOTE — Telephone Encounter (Signed)
Pharmacy Patient Advocate Encounter   Received notification from Pt Calls Messages that prior authorization for Freestyle libre 3 sensor is required/requested.   Insurance verification completed.   The patient is insured through [employer] [facility] .   Per test claim: PA required; PA started via CoverMyMeds. KEY [id number] . Waiting for clinical questions to populate.

## 2023-07-23 ENCOUNTER — Encounter: Payer: Self-pay | Admitting: Internal Medicine

## 2023-07-23 NOTE — Progress Notes (Signed)
Subjective:    Patient ID: Hannah Bryant, female    DOB: 02-25-1946, 77 y.o.   MRN: [id number]     HPI Hannah Bryant is here for follow up of her chronic medical problems.  She has gastroparesis.  That is a chronic and ongoing problem.  July 25th - food impaction.  Had EGD - food removed.  Had swallow test - esophageal dysmotility - likely presbyesophagus.  She is frustrated because she is not sure how she should be eating and is concerned about having another episode of food impaction.  She is unsure if this is something that will get worse.  She is concerned about her high ostomy and if that is making this worse or the cause of this.  She does have a follow-up with GI, but she was not able to get any sooner than the end of next month.  Medications and allergies reviewed with patient and updated if appropriate.  Current Outpatient Medications on File Prior to Visit  Medication Sig Dispense Refill   acetaminophen (TYLENOL ARTHRITIS PAIN) 650 MG CR tablet Take 650 mg by mouth. 2 by mouth 2 times daily     aspirin 81 MG chewable tablet Chew 81 mg by mouth daily.     atorvastatin (LIPITOR) 40 MG tablet TAKE 1 TABLET BY MOUTH EVERY DAY RETURN IN ABOUT 6 MONTHS (AROUND 07/15/2023) FOR FOLLOW UP. 90 tablet 0   Blood Glucose Monitoring Suppl (FREESTYLE FREEDOM LITE) w/Device KIT Use as instructed to check blood sugar 4 times daily. 1 kit 0   Calcium-Vitamin D-Vitamin K (VIACTIV PO) Take by mouth. Reported on 02/28/2016     Continuous Glucose Receiver (FREESTYLE LIBRE 3 READER) DEVI USE AS DIRECTED 1 each 0   Continuous Glucose Sensor (FREESTYLE LIBRE 3 SENSOR) MISC USE TO CHECK BLOOD GLUCOSE DAILY 2 each 2   Cyanocobalamin (B-12) 1000 MCG TABS Take 1,000 mcg by mouth.     denosumab (PROLIA) 60 MG/ML SOSY injection Inject 60 mg into the skin every 6 (six) months.     esomeprazole (NEXIUM) 40 MG capsule Take 1 capsule (40 mg total) by mouth daily. --- Needs office visit before further  refills    30 capsule 2   fexofenadine (ALLEGRA) 180 MG tablet Take 180 mg by mouth daily.     glucose blood (FREESTYLE TEST STRIPS) test strip FreeStyle Precision Neo test strips.  Use as instructed to check blood sugar 1 time a day as a back up for the Freestyle Libre CGM 100 each 12   glucose blood test strip Freestyle freedom test strip Use as instructed to check sugars E11.9 50 each 12   hydrochlorothiazide (MICROZIDE) 12.5 MG capsule Take 1 capsule (12.5 mg total) by mouth daily. Per MD Return in about 6 months (around 07/15/2023) for follow up. 90 capsule 0   Lancets MISC by Does not apply route. Lifespan ultra 2 test strips and lancets     Meclizine HCl (BONINE PO) Take by mouth.     montelukast (SINGULAIR) 10 MG tablet TAKE 1 TABLET BY MOUTH EVERY DAY AS NEEDED RETURN IN ABOUT 6 MONTHS (AROUND 07/15/2023) FOR FOLLOW UP. 90 tablet 0   ondansetron (ZOFRAN ODT) 4 MG disintegrating tablet Take 1 tablet (4 mg total) by mouth every 8 (eight) hours as needed for nausea or vomiting. 30 tablet 0   OVER THE COUNTER MEDICATION multistrain probiotic     VITAMIN D, CHOLECALCIFEROL, PO Take 2,000 Units by mouth daily.  hyoscyamine (LEVSIN SL) 0.125 MG SL tablet DISSOLVE 1-2 TABLETS UNDER TONGUE EVERY 4-6 HOURS AS NEEDED FOR ABDOMINAL PAIN AND CRAMPING (Patient not taking: Reported on 07/24/2023) 900 tablet 3   No current facility-administered medications on file prior to visit.     Review of Systems  Gastrointestinal:  Positive for nausea (when glucose drops). Negative for abdominal pain.       No gerd       Objective:   Vitals:   07/24/23 0936  BP: 126/80  Pulse: 64  Temp: 98 F (36.7 C)  SpO2: 96%   BP Readings from Last 3 Encounters:  07/24/23 126/80  06/12/23 (!) 153/71  06/12/23 (!) 179/100   Wt Readings from Last 3 Encounters:  07/24/23 143 lb (64.9 kg)  06/12/23 140 lb (63.5 kg)  03/25/23 152 lb (68.9 kg)   Body mass index is 25.33 kg/m.    Physical  Exam Constitutional:      General: She is not in acute distress.    Appearance: Normal appearance. She is not ill-appearing.  HENT:     Head: Normocephalic and atraumatic.  Skin:    General: Skin is warm and dry.  Neurological:     Mental Status: She is alert. Mental status is at baseline.  Psychiatric:     Comments: Anxious regarding the situation.        Lab Results  Component Value Date   WBC 7.0 06/12/2023   HGB 14.4 06/12/2023   HCT 42.5 06/12/2023   PLT 142 (L) 06/12/2023   GLUCOSE 89 06/12/2023   CHOL 140 01/15/2023   TRIG 119.0 01/15/2023   HDL 63.40 01/15/2023   LDLCALC 53 01/15/2023   ALT 16 06/12/2023   AST 19 06/12/2023   NA 142 06/12/2023   K 3.9 06/12/2023   CL 101 06/12/2023   CREATININE 0.66 06/12/2023   BUN 21 06/12/2023   CO2 29 06/12/2023   TSH 1.12 09/09/2019   HGBA1C 6.6 (A) 03/25/2023   MICROALBUR 1.1 01/15/2023     Assessment & Plan:    See Problem List for Assessment and Plan of chronic medical problems.

## 2023-07-24 ENCOUNTER — Ambulatory Visit (INDEPENDENT_AMBULATORY_CARE_PROVIDER_SITE_OTHER): Payer: Medicare Other | Admitting: Internal Medicine

## 2023-07-24 VITALS — BP 126/80 | HR 64 | Temp 98.0°F | Ht 63.0 in | Wt 143.0 lb

## 2023-07-24 DIAGNOSIS — E7849 Other hyperlipidemia: Secondary | ICD-10-CM

## 2023-07-24 DIAGNOSIS — K224 Dyskinesia of esophagus: Secondary | ICD-10-CM | POA: Insufficient documentation

## 2023-07-24 DIAGNOSIS — J309 Allergic rhinitis, unspecified: Secondary | ICD-10-CM

## 2023-07-24 DIAGNOSIS — I10 Essential (primary) hypertension: Secondary | ICD-10-CM | POA: Diagnosis not present

## 2023-07-24 MED ORDER — ACARBOSE 25 MG PO TABS: ORAL_TABLET | ORAL | Status: DC

## 2023-07-24 NOTE — Assessment & Plan Note (Signed)
Chronic Controlled, stable Continue singulair 10 mg HS, allegra

## 2023-07-24 NOTE — Assessment & Plan Note (Signed)
Chronic BP well controlled Continue hctz 12.5 mg daily cmp recently normal

## 2023-07-24 NOTE — Assessment & Plan Note (Signed)
New Episode of food impaction in July and that has occurred before Has been eating primarily soft foods-some solid foods and trying to chew them thoroughly She does see a nutritionist and has been discussing with her and trying to get guidance for her She is very anxious about the diagnosis, prognosis which she should not should be doing Answered as many questions as I possibly could Does have GI follow-up, but unfortunately not for several weeks

## 2023-07-24 NOTE — Assessment & Plan Note (Signed)
Chronic Regular exercise and healthy diet encouraged LDL has been controlled Continue atorvastatin 40 mg daily

## 2023-07-28 ENCOUNTER — Other Ambulatory Visit: Payer: Self-pay | Admitting: Internal Medicine

## 2023-07-31 ENCOUNTER — Encounter: Payer: Self-pay | Admitting: Internal Medicine

## 2023-07-31 MED ORDER — FREESTYLE LIBRE 3 PLUS SENSOR MISC: 1.0000 | 3 refills | Status: DC

## 2023-08-10 ENCOUNTER — Encounter: Payer: Self-pay | Admitting: Internal Medicine

## 2023-08-11 DIAGNOSIS — Z23 Encounter for immunization: Secondary | ICD-10-CM | POA: Diagnosis not present

## 2023-08-18 DIAGNOSIS — C021 Malignant neoplasm of border of tongue: Secondary | ICD-10-CM | POA: Diagnosis not present

## 2023-08-25 ENCOUNTER — Ambulatory Visit (INDEPENDENT_AMBULATORY_CARE_PROVIDER_SITE_OTHER): Payer: Medicare Other

## 2023-08-25 VITALS — Ht 63.0 in | Wt 140.0 lb

## 2023-08-25 DIAGNOSIS — Z0000 Encounter for general adult medical examination without abnormal findings: Secondary | ICD-10-CM

## 2023-08-25 NOTE — Patient Instructions (Signed)
Ms. Hannah Bryant , Thank you for taking time to come for your Medicare Wellness Visit. I appreciate your ongoing commitment to your health goals. Please review the following plan we discussed and let me know if I can assist you in the future.   Referrals/Orders/Follow-Ups/Clinician Recommendations: You are due for a coloscopy.  Please talk yo your provider about following up from your last colonoscopy in 2015.    This is a list of the screening recommended for you and due dates:  Health Maintenance  Topic Date Due   Colon Cancer Screening  01/17/2017   Complete foot exam   07/25/2021   Eye exam for diabetics  01/10/2023   COVID-19 Vaccine (7 - 2023-24 season) 07/19/2023   Hemoglobin A1C  09/25/2023   Yearly kidney health urinalysis for diabetes  01/15/2024   DEXA scan (bone density measurement)  01/19/2024   Yearly kidney function blood test for diabetes  06/11/2024   Medicare Annual Wellness Visit  08/24/2024   DTaP/Tdap/Td vaccine (4 - Td or Tdap) 01/11/2032   Pneumonia Vaccine  Completed   Flu Shot  Completed   Hepatitis C Screening  Completed   HPV Vaccine  Aged Out   Zoster (Shingles) Vaccine  Discontinued    Advanced directives: (Copy Requested) Please bring a copy of your health care power of attorney and living will to the office to be added to your chart at your convenience.  Next Medicare Annual Wellness Visit scheduled for next year: No

## 2023-08-25 NOTE — Progress Notes (Signed)
Subjective:   Hannah Bryant is a 77 y.o. female who presents for Medicare Annual (Subsequent) preventive examination.  Visit Complete: Virtual I connected with  Hannah Bryant on 08/25/23 by a audio enabled telemedicine application and verified that I am speaking with the correct person using two identifiers.  Patient Location: Other:  At a [facility]  Provider Location: Home Office  I discussed the limitations of evaluation and management by telemedicine. The patient expressed understanding and agreed to proceed.  Vital Signs: Because this visit was a virtual/telehealth visit, some criteria may be missing or patient reported. Any vitals not documented were not able to be obtained and vitals that have been documented are patient reported.  Because this visit was a virtual/telehealth visit, some criteria may be missing or patient reported. Any vitals not documented were not able to be obtained and vitals that have been documented are patient reported.    Cardiac Risk Factors include: advanced age (>35men, >10 women);diabetes mellitus;hypertension;dyslipidemia;Other (see comment), Risk factor comments: Osteoporosis     Objective:    Today's Vitals   08/25/23 0902  Weight: 140 lb (63.5 kg)  Height: 5\' 3"  (1.6 m)   Body mass index is 24.8 kg/m.     08/25/2023    9:09 AM 06/12/2023    1:57 PM 01/06/2022    9:31 AM 11/25/2018   10:02 AM 11/23/2014    8:48 AM 01/06/2014    9:58 AM  Advanced Directives  Does Patient Have a Medical Advance Directive? Yes No Yes Yes Yes Patient has advance directive, copy not in chart  Type of Advance Directive Healthcare Power of [place];Living will  Healthcare Power of [place];Living will  Living will;Healthcare Power of Attorney Living will  Does patient want to make changes to medical advance directive?   No - Patient declined     Copy of Healthcare Power of Attorney in Chart? No - copy requested  No - copy requested       Current Medications  (verified) Outpatient Encounter Medications as of 08/25/2023  Medication Sig   acetaminophen (TYLENOL ARTHRITIS PAIN) 650 MG CR tablet Take 650 mg by mouth. 2 by mouth 2 times daily   aspirin 81 MG chewable tablet Chew 81 mg by mouth daily.   atorvastatin (LIPITOR) 40 MG tablet TAKE 1 TABLET BY MOUTH EVERY DAY RETURN IN ABOUT 6 MONTHS (AROUND 07/15/2023) FOR FOLLOW UP.   Blood Glucose Monitoring Suppl (FREESTYLE FREEDOM LITE) w/Device KIT Use as instructed to check blood sugar 4 times daily.   Calcium-Vitamin D-Vitamin K (VIACTIV PO) Take by mouth. Reported on 02/28/2016   Continuous Glucose Receiver (FREESTYLE LIBRE 3 READER) DEVI USE AS DIRECTED   Continuous Glucose Sensor (FREESTYLE LIBRE 3 PLUS SENSOR) MISC Inject 1 Device into the skin continuous. Change sensor every 15 days   Continuous Glucose Sensor (FREESTYLE LIBRE 3 SENSOR) MISC USE TO CHECK BLOOD GLUCOSE DAILY   Cyanocobalamin (B-12) 1000 MCG TABS Take 1,000 mcg by mouth.   denosumab (PROLIA) 60 MG/ML SOSY injection Inject 60 mg into the skin every 6 (six) months.   esomeprazole (NEXIUM) 40 MG capsule Take 1 capsule (40 mg total) by mouth daily. --- Needs office visit before further refills      fexofenadine (ALLEGRA) 180 MG tablet Take 180 mg by mouth daily.   glucose blood (FREESTYLE TEST STRIPS) test strip FreeStyle Precision Neo test strips.  Use as instructed to check blood sugar 1 time a day as a back up for the [employer]  CGM   glucose blood test strip Freestyle freedom test strip Use as instructed to check sugars E11.9   hydrochlorothiazide (MICROZIDE) 12.5 MG capsule TAKE 1 CAPSULE (12.5 MG TOTAL) BY MOUTH DAILY. PER MD RETURN IN ABOUT 6 MONTHS (AROUND 07/15/2023) FOR FOLLOW UP.   hyoscyamine (LEVSIN SL) 0.125 MG SL tablet DISSOLVE 1-2 TABLETS UNDER TONGUE EVERY 4-6 HOURS AS NEEDED FOR ABDOMINAL PAIN AND CRAMPING   Lancets MISC by Does not apply route. Lifespan ultra 2 test strips and lancets   Meclizine HCl (BONINE PO) Take  by mouth.   montelukast (SINGULAIR) 10 MG tablet TAKE 1 TABLET BY MOUTH EVERY DAY AS NEEDED RETURN IN ABOUT 6 MONTHS (AROUND 07/15/2023) FOR FOLLOW UP.   ondansetron (ZOFRAN ODT) 4 MG disintegrating tablet Take 1 tablet (4 mg total) by mouth every 8 (eight) hours as needed for nausea or vomiting.   OVER THE COUNTER MEDICATION multistrain probiotic   VITAMIN D, CHOLECALCIFEROL, PO Take 2,000 Units by mouth daily.   acarbose (PRECOSE) 25 MG tablet Take 1 tablet by mouth before 2-3 meals a day (Patient not taking: Reported on 07/24/2023)   No facility-administered encounter medications on file as of 08/25/2023.    Allergies (verified) Domperidone, Fentanyl, Monistat [miconazole nitrate], Nsaids, Oxycodone hcl, Codeine, and Erythromycin ethylsuccinate   History: Past Medical History:  Diagnosis Date   Abnormal finding on EKG    non specific T changes; short PR   Arthritis    Bursitis    tronchanteric   Complication of anesthesia    Severe PONV   Diabetes mellitus without complication (HCC)    Pt states she is pre-diabetic (12-06-13)   Diverticulosis    Gastroparesis    93 % retenton at 2 hrs   GERD (gastroesophageal reflux disease)    H/O dilation and curettage    Hernia    hiatal   Hiatal hernia    Hyperlipidemia    Hyperlipidemia    hypoglycemia    Tubulovillous adenoma polyp of colon 06/2005   Hyperplastic 2007   Past Surgical History:  Procedure Laterality Date   CARDIAC CATHETERIZATION  2005   < 30 % lesion   COLONOSCOPY W/ POLYPECTOMY  2006 ,2007 & 2015   negative 2009; Dr Russella Dar   DILATATION & CURETTAGE/HYSTEROSCOPY WITH TRUECLEAR N/A 01/11/2014   Procedure: DILATATION & CURETTAGE/HYSTEROSCOPY WITH TRUCLEAR;  Surgeon: Dara Lords, MD;  Location: [facility];  Service: Gynecology;  Laterality: N/A;   ESOPHAGEAL MANOMETRY  03/22/15   Decreased peristalsis   ESOPHAGOGASTRODUODENOSCOPY (EGD) WITH PROPOFOL N/A 06/12/2023   Procedure: ESOPHAGOGASTRODUODENOSCOPY (EGD) WITH  PROPOFOL;  Surgeon: Meryl Dare, MD;  Location: [facility];  Service: Gastroenterology;  Laterality: N/A;   Esophagram  03/29/15   Persistent narrowing at site of hiatal hernia; proximal dilation   FOREIGN BODY REMOVAL  06/12/2023   Procedure: FOREIGN BODY REMOVAL;  Surgeon: Meryl Dare, MD;  Location: [facility];  Service: Gastroenterology;;   G 3 P 3     HIATAL HERNIA REPAIR  2000   LEG SURGERY     Trauma LLE/pins and plates   myomectomy  1999   uterine fibroid   ROTATOR CUFF REPAIR Right 05/2017   TRIGGER FINGER RELEASE Right    TUBAL LIGATION     Upper endoscopy and esophageal dilation  02/20/15   Dr Merri Brunette , [facility]   Family History  Problem Relation Age of Onset   Ulcers Father    Diabetes Father    Hypertension Father    Heart  attack Father        > 55   Stroke Father        in 39s   Heart attack Mother 68   Diabetes Sister    Heart attack Sister 52       CABG X 4   Cancer Neg Hx    Colitis Neg Hx    Social History   Socioeconomic History   Marital status: Married    Spouse name: [name]   Number of children: 2   Years of education: Not on file   Highest education level: Not on file  Occupational History    Employer: UNEMPLOYED   Occupation: Retired  Tobacco Use   Smoking status: Former    Current packs/day: 0.00    Types: Cigarettes    Quit date: 11/17/1980    Years since quitting: 42.7   Smokeless tobacco: Never   Tobacco comments:    smoked 1962-1982, up to 1 ppd  Vaping Use   Vaping status: Never Used  Substance and Sexual Activity   Alcohol use: Yes    Alcohol/week: 2.0 standard drinks of alcohol    Types: 2 Glasses of wine per week    Comment: socially,2 / week   Drug use: No   Sexual activity: Yes    Birth control/protection: Post-menopausal  Other Topics Concern   Not on file  Social History Narrative   Daily caffeine use   Married   Metallurgist intermittently   Social Determinants of Health   Financial Resource  Strain: Low Risk  (08/25/2023)   Overall Financial Resource Strain (CARDIA)    Difficulty of Paying Living Expenses: Not hard at all  Food Insecurity: No Food Insecurity (08/25/2023)   Hunger Vital Sign    Worried About Running Out of Food in the Last Year: Never true    Ran Out of Food in the Last Year: Never true  Transportation Needs: No Transportation Needs (08/25/2023)   PRAPARE - Administrator, Civil Service (Medical): No    Lack of Transportation (Non-Medical): No  Physical Activity: Insufficiently Active (08/25/2023)   Exercise Vital Sign    Days of Exercise per Week: 3 days    Minutes of Exercise per Session: 30 min  Stress: Stress Concern Present (08/25/2023)   [employer] of Occupational Health - Occupational Stress Questionnaire    Feeling of Stress : To some extent  Social Connections: Socially Integrated (08/25/2023)   Social Connection and Isolation Panel [NHANES]    Frequency of Communication with Friends and Family: More than three times a week    Frequency of Social Gatherings with Friends and Family: More than three times a week    Attends Religious Services: More than 4 times per year    Active Member of [employer] or Organizations: Yes    Attends Banker Meetings: 1 to 4 times per year    Marital Status: Married    Tobacco Counseling Counseling given: Not Answered Tobacco comments: smoked 1962-1982, up to 1 ppd   Clinical Intake:  Pre-visit preparation completed: Yes  Pain : No/denies pain     BMI - recorded: 24.8 Nutritional Status: BMI of 19-24  Normal Nutritional Risks: None Diabetes: Yes CBG done?: Yes (per pt 120) CBG resulted in Enter/ Edit results?: No Did pt. bring in CBG monitor from home?: No  How often do you need to have someone help you when you read instructions, pamphlets, or other written materials from your doctor or  pharmacy?: 1 - Never  Interpreter Needed?: No  Information entered by :: Janice Bodine,  RMA   Activities of Daily Living    08/25/2023    9:06 AM  In your present state of health, do you have any difficulty performing the following activities:  Hearing? 0  Vision? 0  Difficulty concentrating or making decisions? 0  Walking or climbing stairs? 0  Dressing or bathing? 0  Doing errands, shopping? 0  Preparing Food and eating ? N  Using the Toilet? N  In the past six months, have you accidently leaked urine? N  Do you have problems with loss of bowel control? N  Managing your Medications? N  Managing your Finances? N  Housekeeping or managing your Housekeeping? N    Patient Care Team: Pincus Sanes, MD as PCP - General (Internal Medicine) Kathyrn Sheriff, [facility] (Inactive) as Pharmacist (Pharmacist) Shon Millet, MD as Consulting Physician (Ophthalmology)  Indicate any recent Medical Services you may have received from other than [facility] providers in the past year (date may be approximate).     Assessment:   This is a routine wellness examination for Analina.  Hearing/Vision screen Hearing Screening - Comments:: Denies hearing difficulties   Vision Screening - Comments:: Denies vision issues/ Cataract sugery   Goals Addressed   None   Depression Screen    08/25/2023    9:13 AM 01/06/2022    9:38 AM 07/25/2020    1:50 PM 11/26/2018    8:57 AM 11/25/2018   10:02 AM 11/06/2016    8:34 AM 10/01/2015    8:09 AM  PHQ 2/9 Scores  PHQ - 2 Score 1 0 0 0 0 0 0  PHQ- 9 Score 2          Fall Risk    08/25/2023    9:09 AM 07/07/2022    8:50 AM 01/06/2022    9:32 AM 07/25/2020    1:49 PM 11/25/2018   10:02 AM  Fall Risk   Falls in the past year? 0 0 0 0 0  Number falls in past yr: 0 0 0 0   Injury with Fall? 0 0 0 0   Risk for fall due to : No Fall Risks Other (Comment) No Fall Risks    Follow up Falls prevention discussed;Falls evaluation completed Falls evaluation completed Falls evaluation completed      MEDICARE RISK AT HOME: Medicare Risk at Home Any  stairs in or around the home?: Yes If so, are there any without handrails?: Yes Home free of loose throw rugs in walkways, pet beds, electrical cords, etc?: Yes Adequate lighting in your home to reduce risk of falls?: Yes Life alert?: No Use of a cane, walker or w/c?: No Grab bars in the bathroom?: Yes Shower chair or bench in shower?: Yes Elevated toilet seat or a handicapped toilet?: Yes  TIMED UP AND GO:  Was the test performed?  No    Cognitive Function:        08/25/2023    9:10 AM  6CIT Screen  What Year? 0 points  What month? 0 points  What time? 0 points  Count back from 20 0 points  Months in reverse 0 points  Repeat phrase 0 points  Total Score 0 points    Immunizations Immunization History  Administered Date(s) Administered   Fluad Quad(high Dose 65+) 07/30/2022   Fluad Trivalent(High Dose 65+) 08/11/2023   Influenza Whole 09/30/2007, 08/17/2008, 08/17/2012   Influenza, High Dose Seasonal  PF 08/16/2015, 07/30/2016, 08/24/2017, 07/26/2019, 08/24/2020   Influenza-Unspecified 08/25/2013, 08/01/2014   Moderna Covid-19 Vaccine Bivalent Booster 37yrs & up 04/05/2022   PFIZER Comirnaty(Gray Top)Covid-19 Tri-Sucrose Vaccine 02/01/2021   PFIZER(Purple Top)SARS-COV-2 Vaccination 11/26/2019, 12/17/2019, 08/04/2020   Pneumococcal Conjugate-13 10/21/2007, 01/09/2016   Pneumococcal Polysaccharide-23 10/21/2007, 11/24/2013   Respiratory Syncytial Virus Vaccine,Recomb Aduvanted(Arexvy) 09/02/2022   Td 04/02/1999, 10/17/2010   Tdap 01/10/2022   Unspecified SARS-COV-2 Vaccination 08/11/2022, 01/26/2023   Zoster, Live 11/30/2007    TDAP status: Up to date  Flu Vaccine status: Up to date  Pneumococcal vaccine status: Up to date  Covid-19 vaccine status: Information provided on how to obtain vaccines.   Qualifies for Shingles Vaccine? Yes   Zostavax completed Yes   Shingrix Completed?: No.    Education has been provided regarding the importance of this vaccine.  Patient has been advised to call insurance company to determine out of pocket expense if they have not yet received this vaccine. Advised may also receive vaccine at local pharmacy or Health Dept. Verbalized acceptance and understanding.  Screening Tests Health Maintenance  Topic Date Due   Colonoscopy  01/17/2017   FOOT EXAM  07/25/2021   OPHTHALMOLOGY EXAM  01/10/2023   COVID-19 Vaccine (8 - 2023-24 season) 07/19/2023   HEMOGLOBIN A1C  09/25/2023   Diabetic kidney evaluation - Urine ACR  01/15/2024   DEXA SCAN  01/19/2024   Diabetic kidney evaluation - eGFR measurement  06/11/2024   Medicare Annual Wellness (AWV)  08/24/2024   DTaP/Tdap/Td (4 - Td or Tdap) 01/11/2032   Pneumonia Vaccine 83+ Years old  Completed   INFLUENZA VACCINE  Completed   Hepatitis C Screening  Completed   HPV VACCINES  Aged Out   Zoster Vaccines- Shingrix  Discontinued    Health Maintenance  Health Maintenance Due  Topic Date Due   Colonoscopy  01/17/2017   FOOT EXAM  07/25/2021   OPHTHALMOLOGY EXAM  01/10/2023   COVID-19 Vaccine (8 - 2023-24 season) 07/19/2023    Colorectal cancer screening: No longer required.   Mammogram status: Completed 12/26/2022. Repeat every year  Bone Density status: Completed 01/18/2021. Results reflect: Bone density results: OSTEOPOROSIS. Repeat every 2 years.  Lung Cancer Screening: (Low Dose CT Chest recommended if Age 26-80 years, 20 pack-year currently smoking OR have quit w/in 15years.) does not qualify.   Lung Cancer Screening Referral: N/A  Additional Screening:  Hepatitis C Screening: does not qualify; Completed 01/05/2017  Vision Screening: Recommended annual ophthalmology exams for early detection of glaucoma and other disorders of the eye. Is the patient up to date with their annual eye exam?  No  Who is the provider or what is the name of the office in which the patient attends annual eye exams? Dr. Algernon Huxley If pt is not established with a provider, would  they like to be referred to a provider to establish care? No .   Dental Screening: Recommended annual dental exams for proper oral hygiene  Diabetic Foot Exam: Diabetic Foot Exam: Overdue, Pt has been advised about the importance in completing this exam. Pt is scheduled for diabetic foot exam on N?A.  Community Resource Referral / Chronic Care Management: CRR required this visit?  No   CCM required this visit?  No     Plan:     I have personally reviewed and noted the following in the patient's chart:   Medical and social history Use of alcohol, tobacco or illicit drugs  Current medications and supplements including opioid prescriptions. Patient is  not currently taking opioid prescriptions. Functional ability and status Nutritional status Physical activity Advanced directives List of other physicians Hospitalizations, surgeries, and ER visits in previous 12 months Vitals Screenings to include cognitive, depression, and falls Referrals and appointments  In addition, I have reviewed and discussed with patient certain preventive protocols, quality metrics, and best practice recommendations. A written personalized care plan for preventive services as well as general preventive health recommendations were provided to patient.     Jasneet Schobert L Lizet Kelso, CMA   08/25/2023   After Visit Summary: (MyChart) Due to this being a telephonic visit, the after visit summary with patients personalized plan was offered to patient via MyChart   Nurse Notes: Patient is due for a colonoscopy as her last one was in 2015 and was to have another in 2018.  Patient was informed of this information and is aware that she needs to follow up on that with PCP.  Patient stated that she had an eye exam coming up soon as she had to reschedule her appointment due to mouth pain.  There were no other concerns to discuss today.

## 2023-09-05 DIAGNOSIS — Z23 Encounter for immunization: Secondary | ICD-10-CM | POA: Diagnosis not present

## 2023-09-10 ENCOUNTER — Encounter: Payer: Self-pay | Admitting: Internal Medicine

## 2023-09-10 DIAGNOSIS — C069 Malignant neoplasm of mouth, unspecified: Secondary | ICD-10-CM | POA: Diagnosis not present

## 2023-09-15 DIAGNOSIS — C069 Malignant neoplasm of mouth, unspecified: Secondary | ICD-10-CM | POA: Diagnosis not present

## 2023-09-16 ENCOUNTER — Encounter: Payer: Self-pay | Admitting: Gastroenterology

## 2023-09-16 ENCOUNTER — Ambulatory Visit (INDEPENDENT_AMBULATORY_CARE_PROVIDER_SITE_OTHER): Payer: Medicare Other | Admitting: Gastroenterology

## 2023-09-16 VITALS — BP 118/62 | HR 69 | Ht 63.0 in | Wt 142.0 lb

## 2023-09-16 DIAGNOSIS — R1319 Other dysphagia: Secondary | ICD-10-CM

## 2023-09-16 DIAGNOSIS — R933 Abnormal findings on diagnostic imaging of other parts of digestive tract: Secondary | ICD-10-CM

## 2023-09-16 DIAGNOSIS — Z860100 Personal history of colon polyps, unspecified: Secondary | ICD-10-CM

## 2023-09-16 NOTE — Patient Instructions (Signed)
Call our office back with the decision on scheduling your Upper Endoscopy after speaking with your ENT physician.   The [facility] providers would like to encourage you to use [facility] to communicate with providers for non-urgent requests or questions.  Due to long hold times on the telephone, sending your provider a message by [facility] may be a faster and more efficient way to get a response.  Please allow 48 business hours for a response.  Please remember that this is for non-urgent requests.   Thank you for choosing me and [facility].  Venita Lick. Pleas Koch., MD., Clementeen Graham

## 2023-09-16 NOTE — Progress Notes (Signed)
Assessment     Dysphagia, recent food impaction, abnormal esophagram with esophageal dilation, dysmotility and hold-up of barium tablet at the EG junction S/P Nissen fundoplication Squamous cell carcinoma of the tongue, recent diagnosis Personal history of adenomatous and sessile serrated colon polyps - previously declined colonoscopy in 2019, not addressed today   Recommendations    Recommended EGD with dilation prior to initiating treatment for tongue cancer however they would like to confer with her ENT physician regarding treatment plan for her tongue cancer prior to scheduling.  They are advised to contact us regarding EGD timing. Continue Nexium 40 mg every day. Consider repeat esophageal manometry study   HPI    This is a 77 year old female returning for follow-up of dysphagia, recent food impaction, abnormal esophagram.  She is accompanied by her husband.  Her last office visit was in October 2019.  Since removal of her food impaction in July she has been avoiding meats, breads and eating a soft diet without significant difficulty.  Unfortunately she was recently diagnosed with tongue cancer and a treatment plan has not yet been established.   EGD July 2024 - Dilation in the mid esophagus and in the distal esophagus.  - Food in the mid esophagus and in the distal esophagus. Removal was successful.  - Erythema in the distal esophagus.  - A Nissen fundoplication was found. The wrap appears intact.  - Normal duodenal bulb and second portion of the duodenum.   EGD Apr 2016 Dr. Redgie Grayer  [facility] and [facility], esophageal dilation performed with [facility] dilator   Esophageal manometry Apr 2016 [facility] LES pressure at lower limit of normal, weak peristalsis, delayed bolus clearance   Labs / Imaging       Latest Ref Rng & Units 06/12/2023    1:05 PM 01/15/2023    7:37 AM 01/07/2022   10:42 AM  Hepatic Function  Total Protein 6.5 - 8.1 g/dL 7.1  6.2  7.0   Albumin 3.5 - 5.0  g/dL 4.4  3.8  4.5   AST 15 - 41 U/L 19  19  21    ALT 0 - 44 U/L 16  16  17    Alk Phosphatase 38 - 126 U/L 48  35  64   Total Bilirubin 0.3 - 1.2 mg/dL 0.7  0.5  0.6        Latest Ref Rng & Units 06/12/2023    1:05 PM 01/15/2023    7:37 AM 01/07/2022   10:42 AM  CBC  WBC 4.0 - 10.5 K/uL 7.0  6.0  6.6   Hemoglobin 12.0 - 15.0 g/dL 16.1  09.6  04.5   Hematocrit 36.0 - 46.0 % 42.5  40.6  40.9   Platelets 150 - 400 K/uL 142  153.0  159.0      DG ESOPHAGUS W SINGLE CM (SOL OR THIN BA) CLINICAL DATA:  77 year old female with history of GERD, Nissen fundoplication 20 years ago and recent EGD with food removal from mid to distal esophagus on 06/12/2023 presents for esophagram.  EXAM: ESOPHAGUS/BARIUM SWALLOW/TABLET STUDY  TECHNIQUE: Single contrast examination was performed using thin liquid barium. This exam was performed by Lawernce Ion, PA-C, and was supervised and interpreted by Jeronimo Greaves, MD.  FLUOROSCOPY: Radiation Exposure Index (as provided by the fluoroscopic device): 32.2 mGy Kerma  COMPARISON:  None Available.  FINDINGS: Swallowing: Appears normal. No vestibular penetration or aspiration seen.  Pharynx: Unremarkable.  Esophagus: Mild esophageal dilatation. Mass-effect upon the distal esophagus  secondary to fundoplication wrap. Expected narrowing at the wrap site, most apparent on 49/5. No high-grade obstruction at this level.  Esophageal motility: Significantly decreased with proximal escape and tertiary contractions.  Hiatal Hernia: None.  Gastroesophageal reflux: None visualized. Not reproduced by water siphon, Valsalva maneuver, or cough.  Ingested 13mm barium tablet: The 13 mm barium tablet never passed the GE junction, observed for 5 minutes.  Other: None.  IMPRESSION: 1. Esophageal dysmotility, likely presbyesophagus. 2. Intact Nissen fundoplication wrap, without evidence of obstruction. Upstream esophageal dilatation is likely physiologic. 3.  Lack of 13 mm barium tablet passage at the GE junction, likely a combination of dysmotility and fundoplication.  Electronically Signed   By: Jeronimo Greaves M.D.   On: 06/24/2023 10:05   Current Medications, Allergies, Past Medical History, Past Surgical History, Family History and Social History were reviewed in [name].   Physical Exam: General: Well developed, well nourished, no acute distress Head: Normocephalic and atraumatic Eyes: Sclerae anicteric, EOMI Ears: Normal auditory acuity Mouth: No deformities or lesions noted Lungs: Clear throughout to auscultation Heart: Regular rate and rhythm; No murmurs, rubs or bruits Abdomen: Soft, non tender and non distended. No masses, hepatosplenomegaly or hernias noted. Normal Bowel sounds Rectal: Not done Musculoskeletal: Symmetrical with no gross deformities  Pulses:  Normal pulses noted Extremities: No edema or deformities noted Neurological: Alert oriented x 4, grossly nonfocal Psychological:  Alert and cooperative. Normal mood and affect   Roderic Lammert T. Russella Dar, MD 09/16/2023, 9:48 AM

## 2023-09-17 DIAGNOSIS — Z85810 Personal history of malignant neoplasm of tongue: Secondary | ICD-10-CM | POA: Insufficient documentation

## 2023-09-17 DIAGNOSIS — C029 Malignant neoplasm of tongue, unspecified: Secondary | ICD-10-CM | POA: Insufficient documentation

## 2023-09-18 DIAGNOSIS — C021 Malignant neoplasm of border of tongue: Secondary | ICD-10-CM | POA: Diagnosis not present

## 2023-09-21 ENCOUNTER — Ambulatory Visit: Payer: Medicare Other | Admitting: Internal Medicine

## 2023-09-21 DIAGNOSIS — C029 Malignant neoplasm of tongue, unspecified: Secondary | ICD-10-CM | POA: Diagnosis not present

## 2023-09-21 DIAGNOSIS — R1310 Dysphagia, unspecified: Secondary | ICD-10-CM | POA: Diagnosis not present

## 2023-09-22 ENCOUNTER — Ambulatory Visit (INDEPENDENT_AMBULATORY_CARE_PROVIDER_SITE_OTHER): Payer: Medicare Other | Admitting: Internal Medicine

## 2023-09-22 ENCOUNTER — Other Ambulatory Visit: Payer: Self-pay | Admitting: Internal Medicine

## 2023-09-22 ENCOUNTER — Encounter: Payer: Self-pay | Admitting: Internal Medicine

## 2023-09-22 VITALS — BP 136/76 | HR 66 | Ht 63.0 in | Wt 144.6 lb

## 2023-09-22 DIAGNOSIS — E11649 Type 2 diabetes mellitus with hypoglycemia without coma: Secondary | ICD-10-CM

## 2023-09-22 DIAGNOSIS — E559 Vitamin D deficiency, unspecified: Secondary | ICD-10-CM

## 2023-09-22 DIAGNOSIS — E161 Other hypoglycemia: Secondary | ICD-10-CM

## 2023-09-22 MED ORDER — ACARBOSE 25 MG PO TABS: 25.0000 mg | ORAL_TABLET | Freq: Two times a day (BID) | ORAL | 3 refills | Status: DC

## 2023-09-22 NOTE — Progress Notes (Signed)
Patient ID: GREENLY RARICK, female   DOB: 05-15-1946, 77 y.o.   MRN: [id number]   HPI: DEVYN GRIFFING is a 77 y.o.-year-old female, presenting for f/u for DM2, dx in ~2009 (or earlier), diet-controlled, with complications (reactive hypoglycemia) and osteoporosis.  Previously seen by Dr. Talmage Nap. Last 06/2016.  Last visit with me 6 months ago.  Interim history: No increased urination, blurry vision, nausea, chest pain.  She has night sweats. She still occasionally sees lower blood sugars in the afternoon, in the 40s-50s. 2 weeks prior to our last visit she started to see a nutritionist. Since last visit, she had investigation for dysphagia and esophageal food impaction.  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  She will have resection later this month at [facility]. She is eating pured foods for now, mostly carbs.  Reactive hypoglycemia:  Reviewed history: Pt described that has had hypoglycemia every pm for "ages". Sometimes: sweating, feeling hot, nausea, sometimes vomiting.  She may need to take a nap afterward.  She started to see Dr Merri Brunette ([facility]) >> hypoglycemic episodes = better. She had an EGD, Ba swallow test >> she had a stricture at the site of her previous hiatal hernia with food retention in the Esophagus long after she eats (procedure: Spring 2016). She had Esophageal dilation. She also had a capsule endoscopy.  Her hypoglycemia improved after esophageal stretching.  Lowest CBG are in the 50s but lately in the 60s.  She corrects these with OJ or honey sticks. Lunch is a sandwich/wrap + yoghurt at 1 pm and snack at 3 pm (cheese or PB+apples or PB crackers), supper at 7:30 pm.  DM2:  HbA1c levels reviewed: Lab Results  Component Value Date   HGBA1C 6.6 (A) 03/25/2023   HGBA1C 6.5 (A) 09/16/2022   HGBA1C 6.5 (A) 03/14/2022   She has previously on diabetic medications but she stopped that she was not feeling  well on them.  She would not want to restart.  She checks her sugars more than 4 times a day with her freestyle libre CGM:  Previously:  Previously:  Lowest sugar was: 58 >> 40 >> 40s She has hypoglycemia awareness in the 80s. Highest sugar was 300s ...>> 262 >> 200.  Glucometer: 4: OneTouch Verio and Freestyle  She saw nutrition in 2016.  -No CKD, last BUN/creatinine:  Lab Results  Component Value Date   BUN 21 06/12/2023   CREATININE 0.66 06/12/2023   Lab Results  Component Value Date   MICRALBCREAT 0.6 01/15/2023   MICRALBCREAT 0.9 01/07/2022   MICRALBCREAT 24 07/25/2020   MICRALBCREAT 0.8 09/09/2019   MICRALBCREAT 0.8 09/15/2018   MICRALBCREAT 0.5 06/30/2016   MICRALBCREAT 0.2 12/05/2013   MICRALBCREAT 0.3 10/22/2012   MICRALBCREAT 0.3 10/23/2011   MICRALBCREAT 4.4 09/06/2008   -+ HL; last set of lipids: Lab Results  Component Value Date   CHOL 140 01/15/2023   HDL 63.40 01/15/2023   LDLCALC 53 01/15/2023   TRIG 119.0 01/15/2023   CHOLHDL 2 01/15/2023  On Lipitor 40.  Last eye exam: 2023: No DR  Last foot exam: 2021  She has osteopenia on DXA but with her history of humerus fracture, she has clinical osteoporosis: She has a history of a humerus fracture in 01/2017 (however, this was not seen on x-ray...).  Reviewed her previous DXA scan reports: 01/18/2021 Lumbar spine L1- L3 (L4) Femoral neck (FN)  T-score  +1.2 RFN: -2.2 LFN: -2.2  Change in  BMD from previous DXA test (%)  +5.1%* -6.6%*  (*) statistically significant  02/08/2017 Lumbar spine (L1-L4) Femoral neck (FN)  T-score 0.6 RFN:-2.0 LFN:-1.6  Change in BMD from previous DXA test (%) Down 2.0% Down 4.3%    02/03/2021 Lumbar spine L1- L3 (L4) Femoral neck (FN)  T-score  +1.2 RFN: -2.2 LFN: -2.2  Change in BMD from previous DXA test (%)  +5.1%* -6.6%*  (*) statistically significant  I suggested Prolia at previous visits and we discussed about benefits and possible side effects.  She  prefered to wait until her dental implants were completed to decide about this. Now she finished dental work (sees Dr. Dennison Mascot).  She finally started this on 03/24/2022 in PCPs office.  Vitamin D insufficiency:  Reviewed vitamin D levels: Lab Results  Component Value Date   VD25OH 43.36 03/25/2023   VD25OH 26.89 (L) 01/15/2023   VD25OH 37.31 01/07/2022   VD25OH 37.68 03/20/2021   VD25OH 38.19 01/22/2021   VD25OH 31.7 07/19/2020   VD25OH 97.50 09/09/2019   VD25OH 33.78 09/27/2018   VD25OH 24.24 (L) 03/24/2018   VD25OH 28.68 (L) 03/04/2017   She was on 1000 units vitamin D daily >> stopped by PCP >> restarted 500 units daily.  Dose was subsequently increased to 2000 units daily.  ROS: + see HPI  I reviewed pt's medications, allergies, PMH, social hx, family hx, and changes were documented in the history of present illness. Otherwise, unchanged from my initial visit note.  History   Social History   Marital Status: Married    Spouse Name: N/A   Number of Children: 2   Social History Main Topics   Smoking status: Former Smoker    Types: Cigarettes    Quit date: 11/17/1980   Smokeless tobacco: Never Used     Comment: smoked 1962-1982, up to 1 ppd   Alcohol Use: 1.2 oz/week    2 Glasses of wine per week     Comment: socially,2 / week   Drug Use: No   Sexual Activity: Yes    Birth Control/ Protection: Post-menopausal   Social History Narrative   Daily caffeine use   Married   Metallurgist intermittently   Past Medical History:  Diagnosis Date   Abnormal finding on EKG    non specific T changes; short PR   Arthritis    Bursitis    tronchanteric   Complication of anesthesia    Severe PONV   Diabetes mellitus without complication (HCC)    Pt states she is pre-diabetic (12-06-13)   Diverticulosis    Gastroparesis    93 % retenton at 2 hrs   GERD (gastroesophageal reflux disease)    H/O dilation and curettage    Hernia    hiatal   Hiatal hernia     Hyperlipidemia    Hyperlipidemia    hypoglycemia    Tubulovillous adenoma polyp of colon 06/2005   Hyperplastic 2007   Past Surgical History:  Procedure Laterality Date   CARDIAC CATHETERIZATION  2005   < 30 % lesion   COLONOSCOPY W/ POLYPECTOMY  2006 ,2007 & 2015   negative 2009; Dr Russella Dar   DILATATION & CURETTAGE/HYSTEROSCOPY WITH TRUECLEAR N/A 01/11/2014   Procedure: DILATATION & CURETTAGE/HYSTEROSCOPY WITH TRUCLEAR;  Surgeon: Dara Lords, MD;  Location: [facility];  Service: Gynecology;  Laterality: N/A;   ESOPHAGEAL MANOMETRY  03/22/15   Decreased peristalsis   ESOPHAGOGASTRODUODENOSCOPY (EGD) WITH PROPOFOL N/A 06/12/2023   Procedure: ESOPHAGOGASTRODUODENOSCOPY (EGD) WITH PROPOFOL;  Surgeon: Meryl Dare, MD;  Location: [facility];  Service: Gastroenterology;  Laterality: N/A;   Esophagram  03/29/15   Persistent narrowing at site of hiatal hernia; proximal dilation   FOREIGN BODY REMOVAL  06/12/2023   Procedure: FOREIGN BODY REMOVAL;  Surgeon: Meryl Dare, MD;  Location: [facility];  Service: Gastroenterology;;   G 3 P 3     HIATAL HERNIA REPAIR  2000   LEG SURGERY     Trauma LLE/pins and plates   myomectomy  1999   uterine fibroid   ROTATOR CUFF REPAIR Right 05/2017   TRIGGER FINGER RELEASE Right    TUBAL LIGATION     Upper endoscopy and esophageal dilation  02/20/15   Dr Merri Brunette , [facility]   Current Outpatient Medications on File Prior to Visit  Medication Sig Dispense Refill   acetaminophen (TYLENOL ARTHRITIS PAIN) 650 MG CR tablet Take 650 mg by mouth. 2 by mouth 2 times daily     aspirin 81 MG chewable tablet Chew 81 mg by mouth daily.     atorvastatin (LIPITOR) 40 MG tablet TAKE 1 TABLET BY MOUTH EVERY DAY RETURN IN ABOUT 6 MONTHS (AROUND 07/15/2023) FOR FOLLOW UP. 90 tablet 0   Continuous Glucose Receiver (FREESTYLE LIBRE 3 READER) DEVI USE AS DIRECTED 1 each 0   Continuous Glucose Sensor (FREESTYLE LIBRE 3 PLUS SENSOR) MISC Inject 1 Device into the skin  continuous. Change sensor every 15 days 6 each 3   Cyanocobalamin (B-12) 1000 MCG TABS Take 1,000 mcg by mouth.     denosumab (PROLIA) 60 MG/ML SOSY injection Inject 60 mg into the skin every 6 (six) months.     esomeprazole (NEXIUM) 40 MG capsule Take 1 capsule (40 mg total) by mouth daily. --- Needs office visit before further refills    30 capsule 2   fexofenadine (ALLEGRA) 180 MG tablet Take 180 mg by mouth daily.     glucose blood (FREESTYLE TEST STRIPS) test strip FreeStyle Precision Neo test strips.  Use as instructed to check blood sugar 1 time a day as a back up for the Freestyle Libre CGM 100 each 12   glucose blood test strip Freestyle freedom test strip Use as instructed to check sugars E11.9 50 each 12   hydrochlorothiazide (MICROZIDE) 12.5 MG capsule TAKE 1 CAPSULE (12.5 MG TOTAL) BY MOUTH DAILY. PER MD RETURN IN ABOUT 6 MONTHS (AROUND 07/15/2023) FOR FOLLOW UP. 90 capsule 0   hyoscyamine (LEVSIN SL) 0.125 MG SL tablet DISSOLVE 1-2 TABLETS UNDER TONGUE EVERY 4-6 HOURS AS NEEDED FOR ABDOMINAL PAIN AND CRAMPING 900 tablet 3   Lancets MISC by Does not apply route. Lifespan ultra 2 test strips and lancets     Meclizine HCl (BONINE PO) Take by mouth.     montelukast (SINGULAIR) 10 MG tablet TAKE 1 TABLET BY MOUTH EVERY DAY AS NEEDED RETURN IN ABOUT 6 MONTHS (AROUND 07/15/2023) FOR FOLLOW UP. 90 tablet 0   ondansetron (ZOFRAN ODT) 4 MG disintegrating tablet Take 1 tablet (4 mg total) by mouth every 8 (eight) hours as needed for nausea or vomiting. 30 tablet 0   OVER THE COUNTER MEDICATION multistrain probiotic     VITAMIN D, CHOLECALCIFEROL, PO Take 2,000 Units by mouth daily.     No current facility-administered medications on file prior to visit.   Allergies  Allergen Reactions   Domperidone     Stomach cramps   Fentanyl Nausea And Vomiting   Monistat [Miconazole Nitrate]     SEVERE BURNING  Nsaids     REACTION: stomach complaints   Oxycodone Hcl     REACTION: VOMITTING    Codeine     nausea   Erythromycin Ethylsuccinate     gastritis   Family History  Problem Relation Age of Onset   Ulcers Father    Diabetes Father    Hypertension Father    Heart attack Father        > 50   Stroke Father        in 29s   Heart attack Mother 71   Diabetes Sister    Heart attack Sister 69       CABG X 4   Cancer Neg Hx    Colitis Neg Hx    PE: BP 136/76   Pulse 66   Ht 5\' 3"  (1.6 m)   Wt 144 lb 9.6 oz (65.6 kg)   LMP 11/18/2000   SpO2 96%   BMI 25.61 kg/m   Wt Readings from Last 3 Encounters:  09/22/23 144 lb 9.6 oz (65.6 kg)  09/16/23 142 lb (64.4 kg)  08/25/23 140 lb (63.5 kg)   Constitutional: normal weight, in NAD Eyes:  EOMI, no exophthalmos ENT: no neck masses, no cervical lymphadenopathy Cardiovascular: RRR, No MRG Respiratory: CTA B Musculoskeletal: no deformities Skin:no rashes Neurological: no tremor with outstretched hands  ASSESSMENT: 1. Diet-controlled DM2, with complications - hypoglycemia  2. Hypoglycemia - possibly reactive - Possibly related to a history of gastroparesis - improved after her esophageal stretching - We ruled out adrenal insufficiency: Component     Latest Ref Rng 02/02/2015 02/02/2015 02/02/2015         9:21 AM 10:02 AM 10:02 AM  Cortisol, Plasma      7.1 29.1 24.2  C206 ACTH     6 - 50 pg/mL 12     3.  Clinical osteoporosis  4.  Vitamin D insufficiency  PLAN:  1. DM2 and 2. History of reactive/postprandial hypoglycemia -Diet controlled. -At previous visits we discussed about the guidelines not recommending CGM monitoring for patients with reactive hypoglycemia due to increase anxiety.  I explained that an occasional drop in blood sugars under 70 may not be abnormal, especially on the CGM, which may show her readings that are 20 to 30 mg/dL lower than they actually are and also, especially since she is not feeling them.  It is likely that her body is able to correct these.  This does not mean that she  should not improve her diet to avoid hyperglycemic spikes, for example eliminating concentrated sweets (discussed about examples), since these decrease in glucose levels appear to happen after an initial hyperglycemic peak.  However, she appears that she is usually trying to chase the low blood sugars throughout the day and this appears to be decreasing her quality of life.  She mentions that she is not going out to eat at night if her sugars are low. -At previous visits I did not suggest therapeutic intervention (there is no approved treatment available for this condition, rather, dietary changes are necessary).  I did recommend having carbs with her just in case she needed to correct a low blood sugar.  I did advise her to try not to correct the blood sugars immediately but allow the body to correct them.  She did notice that allowing the blood sugars to drop a little bit more before correcting them actually helped.  She is currently not fighting with the blood sugars as much as before.  She still corrects blood sugars low than 80 and we discussed about trying to correct only if lower than 70s or 60s.  I also recommended to avoid high glycemic index foods and include healthy fats with meals to avoid blood sugar fluctuations. -At last visit, per reviewing the CGM trends, sugars appears to be fluctuating mainly within the target range but she had some blood sugars that were lower in the afternoon or early evening.  These appeared after an initial peaking blood sugars and I suspected that this was happening as she was trying to correct normal blood sugars with high glycemic index carbs.  However, she was very upset about the blood sugar fluctuations and we discussed that we could try acarbose taken before lunch and maybe 1 or both of the other meals, depending on the effect.  We discussed about the mechanism of action and possible side effects (GI).  She and her husband discussed that she may need a second opinion  at [facility] and I encouraged her to do so.  She did not start acarbose. CGM interpretation: -At today's visit, we reviewed her CGM downloads: It appears that 94% of values are in target range (goal >70%), while 5% are higher than 180 (goal <25%), and 1% are lower than 70 (goal <4%).  The calculated average blood sugar is 130.  The projected HbA1c for the next 3 months (GMI) is 6.4%. -Reviewing the CGM trends, sugars are mostly at goal overnight but quite fluctuating later in the day, with hyperglycemic spikes up to 250s and also lows, sometimes down to 40s.  Again, reviewing the tracings, her lows usually appear after a hyperglycemic spike.  Upon questioning, she is now eating pured foods, mostly carbs.  We discussed about including healthy fats, like.  Avocado, or ground flaxseed.  I advised her to discuss with nutrition about it.  We again discussed about the rationale of adding acarbose, which she did not start that she was afraid that this would slow her GI tract.  I advised her that this is used to block the absorption of carbs, but not to slow gastric emptying.  She will consider it. - we checked her HbA1c: 6.6% (stable)  - advised to check sugars at different times of the day - 4x a day, rotating check times - advised for yearly eye exams >> she is UTD - return to clinic in 6 months  3. Osteopenia + fracture >> clinical osteoporosis - per PCP -Likely age-related + postmenopausal -No falls or fractures since last visit -She does qualify for pharmaceutical treatment as she has clinical osteoporosis (osteopenia on the bone density scan + history of humeral fracture) -On the last bone density scan she had low femoral neck bone density -She is currently on Prolia started 03/19/2022.  She gets this through PCPs office.  She does not particularly like the medication and regrets starting it  4.  Vitamin D insufficiency -She had a high normal vitamin D level in the past, at 97.5, so we stopped her  vitamin D supplement at that time -Afterwards, she was started back on vitamin D, currently 2000 units daily, increased per PCP -On this dose, vitamin D level was normal at last visit, at 43.36.  Carlus Pavlov, MD PhD [facility]

## 2023-09-22 NOTE — Patient Instructions (Addendum)
Consider starting: - Acarbose 25 mg before 2-3 meals a day  However, if you decide not to start it, try to include healthy fats with each meal.  Please come back for a follow-up appointment in 6 months.

## 2023-09-30 ENCOUNTER — Encounter: Payer: Self-pay | Admitting: Internal Medicine

## 2023-10-01 ENCOUNTER — Telehealth: Payer: Self-pay

## 2023-10-02 ENCOUNTER — Telehealth: Payer: Self-pay

## 2023-10-02 NOTE — Telephone Encounter (Signed)
Pt ready for scheduling for PROLIA on or after : 10/02/23  Out-of-pocket cost due at time of visit: $0  Primary: MEDICARE Prolia co-insurance: 0% Admin fee co-insurance: 0%  Secondary: [employer] Prolia co-insurance:  Admin fee co-insurance:   Medical Benefit Details: Date Benefits were checked: 10/02/23 Deductible: $240 MET OF $240 REQUIRED/ Coinsurance: 0%/ Admin Fee: 0%  Prior Auth: N/A PA# Expiration Date:   # of doses approved:  Pharmacy benefit: Copay $--- If patient wants fill through the pharmacy benefit please send prescription to:  -- , and include estimated need by date in rx notes. Pharmacy will ship medication directly to the office.  Patient NOT eligible for Prolia Copay Card. Copay Card can make patient's cost as little as $25. Link to apply: [email or web address]://[email or web address]  ** This summary of benefits is an estimation of the patient's out-of-pocket cost. Exact cost may very based on individual plan coverage.

## 2023-10-02 NOTE — Telephone Encounter (Signed)
Message sent to patient via mychart

## 2023-10-02 NOTE — Telephone Encounter (Signed)
Prolia VOB initiated via [email or web address] 

## 2023-10-02 NOTE — Telephone Encounter (Signed)
Created new encounter for Prolia BIV. Will route encounter back once benefit verification is complete.  

## 2023-10-05 DIAGNOSIS — Z7982 Long term (current) use of aspirin: Secondary | ICD-10-CM | POA: Diagnosis not present

## 2023-10-05 DIAGNOSIS — Z87891 Personal history of nicotine dependence: Secondary | ICD-10-CM | POA: Diagnosis not present

## 2023-10-05 DIAGNOSIS — I10 Essential (primary) hypertension: Secondary | ICD-10-CM | POA: Diagnosis present

## 2023-10-05 DIAGNOSIS — C770 Secondary and unspecified malignant neoplasm of lymph nodes of head, face and neck: Secondary | ICD-10-CM | POA: Diagnosis not present

## 2023-10-05 DIAGNOSIS — C021 Malignant neoplasm of border of tongue: Secondary | ICD-10-CM | POA: Diagnosis present

## 2023-10-05 DIAGNOSIS — E11649 Type 2 diabetes mellitus with hypoglycemia without coma: Secondary | ICD-10-CM | POA: Diagnosis present

## 2023-10-05 DIAGNOSIS — K3184 Gastroparesis: Secondary | ICD-10-CM | POA: Diagnosis present

## 2023-10-09 ENCOUNTER — Telehealth: Payer: Self-pay

## 2023-10-09 NOTE — Transitions of Care (Post Inpatient/ED Visit) (Signed)
   10/09/2023  Name: Hannah Bryant MRN: [id number] DOB: 29-Oct-1946  Today's TOC FU Call Status: Today's TOC FU Call Status:: Unsuccessful Call (1st Attempt) Unsuccessful Call (1st Attempt) Date: 10/09/23  Attempted to reach the patient regarding the most recent Inpatient/ED visit.  Follow Up Plan: Additional outreach attempts will be made to reach the patient to complete the Transitions of Care (Post Inpatient/ED visit) call.   Jodelle Gross RN, BSN, CCM RN Care Manager  Transitions of Care  [facility] - [facility]  [phone]

## 2023-10-09 NOTE — Transitions of Care (Post Inpatient/ED Visit) (Signed)
10/09/2023  Name: Hannah Bryant MRN: [id number] DOB: [birth year]  Today's TOC FU Call Status: Today's TOC FU Call Status:: Successful TOC FU Call Completed Patient's Name and Date of Birth confirmed.  Transition Care Management Follow-up Telephone Call Date of Discharge: 10/08/23 Discharge Facility: Other [facility]) Name of Other (Non[facility]) Discharge Facility: [facility] Type of Discharge: Inpatient Admission Primary Inpatient Discharge Diagnosis:: Tongue Cancer s/p Left Partial Glossectomy How have you been since you were released from the hospital?: Better (Per patients spouse, Hannah Bryant, she is doing pretty well.  She was able to get a shower today and she is not having any pain.) Any questions or concerns?: No  Items Reviewed: Did you receive and understand the discharge instructions provided?: Yes Medications obtained,verified, and reconciled?: Yes (Medications Reviewed) Any new allergies since your discharge?: No Dietary orders reviewed?: Yes (Patient continues to be afraid to swallow, she is having boost with ice cream.  Her husband said they are going to try blended soup for her dinner.) Type of Diet Ordered:: soft Do you have support at home?: Yes People in Home: spouse Name of Support/Comfort Primary Source: Hannah Bryant  Medications Reviewed Today: Medications Reviewed Today     Reviewed by Jodelle Gross, RN (Case Manager) on 10/09/23 at 1549  Med List Status: <None>   Medication Order Taking? Sig Documenting Provider Last Dose Status Informant  acarbose (PRECOSE) 25 MG tablet [id number] Yes Take 1 tablet (25 mg total) by mouth 2 (two) times daily before a meal. Carlus Pavlov, MD Taking Active   acetaminophen (TYLENOL ARTHRITIS PAIN) 650 MG CR tablet [id number] Yes Take 650 mg by mouth. 2 by mouth 2 times daily [provider] Taking Active   aspirin 81 MG chewable tablet [id number] Yes Chew 81 mg by mouth daily. [provider] Taking  Active   atorvastatin (LIPITOR) 40 MG tablet [id number] Yes TAKE 1 TABLET BY MOUTH EVERY DAY RETURN IN ABOUT 6 MONTHS (AROUND 07/15/2023) FOR FOLLOW UP. Pincus Sanes, MD Taking Active   chlorhexidine (PERIDEX) 0.12 % solution [id number] Yes SMARTSIG:By Mouth [provider] Taking Active   Continuous Glucose Receiver (FREESTYLE LIBRE 3 READER) DEVI [id number] Yes USE AS DIRECTED Carlus Pavlov, MD Taking Active   Continuous Glucose Sensor (FREESTYLE LIBRE 3 PLUS SENSOR) MISC [id number] Yes Inject 1 Device into the skin continuous. Change sensor every 15 days Carlus Pavlov, MD Taking Active   Cyanocobalamin (B-12) 1000 MCG TABS [id number] Yes Take 1,000 mcg by mouth. [provider] Taking Active   denosumab (PROLIA) 60 MG/ML SOSY injection [id number] Yes Inject 60 mg into the skin every 6 (six) months. [provider] Taking Active   esomeprazole (NEXIUM) 40 MG capsule [id number] Yes Take 1 capsule (40 mg total) by mouth daily. --- Needs office visit before further refills    Pecola Lawless, MD Taking Active   fexofenadine [facility]) 180 MG tablet [id number] Yes Take 180 mg by mouth daily. [provider] Taking Active   glucose blood (FREESTYLE TEST STRIPS) test strip [id number] Yes FreeStyle Precision Neo test strips.  Use as instructed to check blood sugar 1 time a day as a back up for the Freestyle Libre CGM Pincus Sanes, MD Taking Active   glucose blood test strip [id number] Yes Freestyle freedom test strip Use as instructed to check sugars E11.9 Pincus Sanes, MD Taking Active   hydrochlorothiazide (MICROZIDE) 12.5 MG capsule [id number] Yes TAKE 1 CAPSULE (12.5 MG TOTAL) BY MOUTH DAILY. PER MD RETURN IN ABOUT  6 MONTHS (AROUND 07/15/2023) FOR FOLLOW UP. Pincus Sanes, MD Taking Active   hyoscyamine (LEVSIN SL) 0.125 MG SL tablet [id number] Yes DISSOLVE 1-2 TABLETS UNDER TONGUE EVERY 4-6 HOURS AS NEEDED FOR ABDOMINAL PAIN AND CRAMPING Pincus Sanes, MD Taking  Active   Lancets MISC [id number] No by Does not apply route. Lifespan ultra 2 test strips and lancets [provider] Unknown Active   lidocaine (XYLOCAINE) 2 % solution [id number] Yes SMARTSIG:By Mouth [provider] Taking Active   Meclizine HCl (BONINE PO) [id number] No Take by mouth. [provider] Unknown Active   montelukast (SINGULAIR) 10 MG tablet [id number] Yes TAKE 1 TABLET BY MOUTH EVERY DAY AS NEEDED RETURN IN ABOUT 6 MONTHS (AROUND 07/15/2023) FOR FOLLOW UP. Pincus Sanes, MD Taking Active   ondansetron (ZOFRAN ODT) 4 MG disintegrating tablet [id number] Yes Take 1 tablet (4 mg total) by mouth every 8 (eight) hours as needed for nausea or vomiting. Pincus Sanes, MD Taking Active   OVER THE COUNTER MEDICATION [id number] No multistrain probiotic [provider] Unknown Active   VITAMIN D, CHOLECALCIFEROL, PO [id number] No Take 2,000 Units by mouth daily. [provider] Unknown Active             Home Care and Equipment/Supplies: Were Home Health Services Ordered?: No Any new equipment or medical supplies ordered?: No  Functional Questionnaire: Do you need assistance with bathing/showering or dressing?: No Do you need assistance with meal preparation?: No Do you need assistance with eating?: No Do you have difficulty maintaining continence: No Do you need assistance with getting out of bed/getting out of a chair/moving?: No Do you have difficulty managing or taking your medications?: No  Follow up appointments reviewed: PCP Follow-up appointment confirmed?: NA Specialist Hospital Follow-up appointment confirmed?: Yes Date of Specialist follow-up appointment?: 10/13/23 Follow-Up Specialty Provider:: Patient has follow up with Oncologist next week regarding lymph node biopsy- Dr. Rogelia Boga Do you need transportation to your follow-up appointment?: No Do you understand care options if your condition(s) worsen?: Yes-patient verbalized  understanding  SDOH Interventions Today    Flowsheet Row Most Recent Value  SDOH Interventions   Food Insecurity Interventions Intervention Not Indicated  Housing Interventions Intervention Not Indicated  Transportation Interventions Intervention Not Indicated  Utilities Interventions Intervention Not Indicated       Jodelle Gross RN, BSN, CCM RN Care Manager  Transitions of Care  [facility]  [phone]

## 2023-10-13 DIAGNOSIS — C069 Malignant neoplasm of mouth, unspecified: Secondary | ICD-10-CM | POA: Diagnosis not present

## 2023-10-13 DIAGNOSIS — C028 Malignant neoplasm of overlapping sites of tongue: Secondary | ICD-10-CM | POA: Diagnosis not present

## 2023-10-13 DIAGNOSIS — C029 Malignant neoplasm of tongue, unspecified: Secondary | ICD-10-CM | POA: Diagnosis not present

## 2023-10-19 DIAGNOSIS — C021 Malignant neoplasm of border of tongue: Secondary | ICD-10-CM | POA: Diagnosis not present

## 2023-10-19 DIAGNOSIS — Z9049 Acquired absence of other specified parts of digestive tract: Secondary | ICD-10-CM | POA: Diagnosis not present

## 2023-10-19 DIAGNOSIS — C029 Malignant neoplasm of tongue, unspecified: Secondary | ICD-10-CM | POA: Diagnosis not present

## 2023-10-21 NOTE — Telephone Encounter (Signed)
Called patient today and left message for her to call back and schedule prolia shot by the end of the year.  Benefits checked and she is covered at 100% with no copay.

## 2023-11-02 DIAGNOSIS — C069 Malignant neoplasm of mouth, unspecified: Secondary | ICD-10-CM | POA: Diagnosis not present

## 2023-11-12 ENCOUNTER — Telehealth: Payer: Self-pay

## 2023-11-12 NOTE — Telephone Encounter (Signed)
Patient is cleared for PROLIA injection on 11/16/2023 per PA information on 10/02/2023.  Mentioned in provider note last on 07/24/2023. Medication is CLINIC supplied. CoPay:$0     Pt ready for scheduling for PROLIA on or after : 10/02/23   Out-of-pocket cost due at time of visit: $0   Primary: MEDICARE Prolia co-insurance: 0% Admin fee co-insurance: 0%   Secondary: [employer] [place] Prolia co-insurance:  Admin fee co-insurance:    Medical Benefit Details: Date Benefits were checked: 10/02/23 Deductible: $240 MET OF $240 REQUIRED/ Coinsurance: 0%/ Admin Fee: 0%   Prior Auth: N/A PA# Expiration Date:   # of doses approved:   Pharmacy benefit: Copay $--- If patient wants fill through the pharmacy benefit please send prescription to:  -- , and include estimated need by date in rx notes. Pharmacy will ship medication directly to the office.   Patient NOT eligible for Prolia Copay Card. Copay Card can make patient's cost as little as $25. Link to apply: [email or web address]://[email or web address]   ** This summary of benefits is an estimation of the patient's out-of-pocket cost. Exact cost may very based on individual plan coverage.       Duane Lope, CPhT     10/02/23  2:27 PM Note

## 2023-11-16 ENCOUNTER — Ambulatory Visit: Payer: Medicare Other

## 2023-11-16 DIAGNOSIS — M810 Age-related osteoporosis without current pathological fracture: Secondary | ICD-10-CM | POA: Diagnosis not present

## 2023-11-16 MED ORDER — DENOSUMAB 60 MG/ML ~~LOC~~ SOSY
60.0000 mg | PREFILLED_SYRINGE | Freq: Once | SUBCUTANEOUS | Status: AC
Start: 2024-04-29 — End: 2024-06-08
  Administered 2024-06-08: 60 mg via SUBCUTANEOUS

## 2023-11-16 MED ORDER — DENOSUMAB 60 MG/ML ~~LOC~~ SOSY
60.0000 mg | PREFILLED_SYRINGE | Freq: Once | SUBCUTANEOUS | Status: AC
Start: 2023-11-30 — End: 2023-11-16
  Administered 2023-11-16: 60 mg via SUBCUTANEOUS

## 2023-11-16 NOTE — Addendum Note (Signed)
Addended by: Karma Ganja on: 11/16/2023 11:16 AM   Modules accepted: Orders

## 2023-11-16 NOTE — Progress Notes (Signed)
Patient seen in office today and Prolia administered in left arm.  Patient tolerated injection well and order placed for next injection for June 2025.

## 2023-11-24 ENCOUNTER — Encounter: Payer: Self-pay | Admitting: Internal Medicine

## 2023-11-26 DIAGNOSIS — C069 Malignant neoplasm of mouth, unspecified: Secondary | ICD-10-CM | POA: Diagnosis not present

## 2023-11-29 ENCOUNTER — Other Ambulatory Visit: Payer: Self-pay | Admitting: Internal Medicine

## 2023-12-18 ENCOUNTER — Other Ambulatory Visit: Payer: Self-pay | Admitting: Internal Medicine

## 2023-12-31 ENCOUNTER — Other Ambulatory Visit: Payer: Self-pay

## 2024-01-11 DIAGNOSIS — Z79899 Other long term (current) drug therapy: Secondary | ICD-10-CM | POA: Diagnosis not present

## 2024-01-11 DIAGNOSIS — C028 Malignant neoplasm of overlapping sites of tongue: Secondary | ICD-10-CM | POA: Diagnosis not present

## 2024-01-11 DIAGNOSIS — C069 Malignant neoplasm of mouth, unspecified: Secondary | ICD-10-CM | POA: Diagnosis not present

## 2024-01-11 DIAGNOSIS — Z9889 Other specified postprocedural states: Secondary | ICD-10-CM | POA: Diagnosis not present

## 2024-01-11 DIAGNOSIS — C029 Malignant neoplasm of tongue, unspecified: Secondary | ICD-10-CM | POA: Diagnosis not present

## 2024-01-14 DIAGNOSIS — C029 Malignant neoplasm of tongue, unspecified: Secondary | ICD-10-CM | POA: Diagnosis not present

## 2024-01-18 DIAGNOSIS — L814 Other melanin hyperpigmentation: Secondary | ICD-10-CM | POA: Diagnosis not present

## 2024-01-18 DIAGNOSIS — D485 Neoplasm of uncertain behavior of skin: Secondary | ICD-10-CM | POA: Diagnosis not present

## 2024-01-18 DIAGNOSIS — Z85828 Personal history of other malignant neoplasm of skin: Secondary | ICD-10-CM | POA: Diagnosis not present

## 2024-01-18 DIAGNOSIS — L821 Other seborrheic keratosis: Secondary | ICD-10-CM | POA: Diagnosis not present

## 2024-01-18 DIAGNOSIS — D045 Carcinoma in situ of skin of trunk: Secondary | ICD-10-CM | POA: Diagnosis not present

## 2024-01-27 ENCOUNTER — Ambulatory Visit: Payer: Self-pay | Admitting: Internal Medicine

## 2024-01-27 NOTE — Telephone Encounter (Signed)
 Chief Complaint: Neck pain Frequency: Constant Pertinent Negatives: Patient denies headache, difficulty breathing, numbness/weakness of arms or legs, pain radiation Disposition:  [x] Appointment(In office)  Additional Notes: Pt states she was diagnosed with arthritis in her neck. Pt is taking tylenol arthritis every 6 hours. Pt scheduled for first available appointment with PCP. This RN educated pt on home care, new-worsening symptoms, when to call back/seek emergent care. Pt verbalized understanding and agrees to plan.    Copied from CRM [id number]. Topic: Clinical - Red Word Triage >> Jan 27, 2024 10:21 AM Shelbie Proctor wrote: Red Word that prompted transfer to Nurse Triage: Patient [phone] is having neck pain related to arthritis. Pain level range scale from 1-10, patient states pain level is at 4 with Tylenol arthritis every 6 hours. Patient denies dizziness nor lightheaded. Reason for Disposition  Neck pain is a chronic symptom (recurrent or ongoing AND present > 4 weeks)  Answer Assessment - Initial Assessment Questions 1. ONSET: "When did the pain begin?"      "A long while" but now stronger 2. LOCATION: "Where does it hurt?"      Neck 3. PATTERN "Does the pain come and go, or has it been constant since it started?"      Constant 4. SEVERITY: "How bad is the pain?"  (Scale 1-10; or mild, moderate, severe)   - NO PAIN (0): no pain or only slight stiffness    - MILD (1-3): doesn't interfere with normal activities    - MODERATE (4-7): interferes with normal activities or awakens from sleep    - SEVERE (8-10):  excruciating pain, unable to do any normal activities      4/10 5. RADIATION: "Does the pain go anywhere else, shoot into your arms?"     Denies 6. CORD SYMPTOMS: "Any weakness or numbness of the arms or legs?"     Denies 7. CAUSE: "What do you think is causing the neck pain?"     Arthritis  Protocols used: Neck Pain or Stiffness-A-AH

## 2024-01-31 ENCOUNTER — Encounter: Payer: Self-pay | Admitting: Internal Medicine

## 2024-01-31 NOTE — Progress Notes (Unsigned)
 Subjective:    Patient ID: Hannah Bryant, female    DOB: 1946-06-12, 78 y.o.   MRN: [id number]      HPI Hannah Bryant is here for No chief complaint on file.   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    Neck arthritis -      Medications and allergies reviewed with patient and updated if appropriate.  Current Outpatient Medications on File Prior to Visit  Medication Sig Dispense Refill   acarbose (PRECOSE) 25 MG tablet Take 1 tablet (25 mg total) by mouth 2 (two) times daily before a meal. 180 tablet 3   acetaminophen (TYLENOL ARTHRITIS PAIN) 650 MG CR tablet Take 650 mg by mouth. 2 by mouth 2 times daily     aspirin 81 MG chewable tablet Chew 81 mg by mouth daily.     atorvastatin (LIPITOR) 40 MG tablet TAKE 1 TABLET BY MOUTH EVERY DAY RETURN IN ABOUT 6 MONTHS (AROUND 07/15/2023) FOR FOLLOW UP. 90 tablet 0   chlorhexidine (PERIDEX) 0.12 % solution SMARTSIG:By Mouth     Continuous Glucose Receiver (FREESTYLE LIBRE 3 READER) DEVI USE AS DIRECTED 1 each 0   Continuous Glucose Sensor (FREESTYLE LIBRE 3 PLUS SENSOR) MISC Inject 1 Device into the skin continuous. Change sensor every 15 days 6 each 3   Cyanocobalamin (B-12) 1000 MCG TABS Take 1,000 mcg by mouth.     denosumab (PROLIA) 60 MG/ML SOSY injection Inject 60 mg into the skin every 6 (six) months.     esomeprazole (NEXIUM) 40 MG capsule Take 1 capsule (40 mg total) by mouth daily. --- Needs office visit before further refills    30 capsule 2   fexofenadine (ALLEGRA) 180 MG tablet Take 180 mg by mouth daily.     glucose blood (FREESTYLE TEST STRIPS) test strip FreeStyle Precision Neo test strips.  Use as instructed to check blood sugar 1 time a day as a back up for the Freestyle Libre CGM 100 each 12   glucose blood test strip Freestyle freedom test strip Use as instructed to check sugars E11.9 50 each 12   hydrochlorothiazide (MICROZIDE) 12.5 MG capsule TAKE 1 CAPSULE (12.5 MG TOTAL) BY MOUTH DAILY. PER MD RETURN IN ABOUT 6 MONTHS  (AROUND 07/15/2023) FOR FOLLOW UP. 90 capsule 0   hyoscyamine (LEVSIN SL) 0.125 MG SL tablet DISSOLVE 1-2 TABLETS UNDER TONGUE EVERY 4-6 HOURS AS NEEDED FOR ABDOMINAL PAIN AND CRAMPING 900 tablet 3   Lancets MISC by Does not apply route. Lifespan ultra 2 test strips and lancets     lidocaine (XYLOCAINE) 2 % solution SMARTSIG:By Mouth     Meclizine HCl (BONINE PO) Take by mouth.     montelukast (SINGULAIR) 10 MG tablet TAKE 1 TABLET BY MOUTH EVERY DAY AS NEEDED RETURN IN ABOUT 6 MONTHS (AROUND 07/15/2023) FOR FOLLOW UP. 90 tablet 0   ondansetron (ZOFRAN ODT) 4 MG disintegrating tablet Take 1 tablet (4 mg total) by mouth every 8 (eight) hours as needed for nausea or vomiting. 30 tablet 0   OVER THE COUNTER MEDICATION multistrain probiotic     VITAMIN D, CHOLECALCIFEROL, PO Take 2,000 Units by mouth daily.     Current Facility-Administered Medications on File Prior to Visit  Medication Dose Route Frequency Provider Last Rate Last Admin   [START ON 04/29/2024] denosumab (PROLIA) injection 60 mg  60 mg Subcutaneous Once Pincus Sanes, MD        Review of Systems     Objective:  There were no  vitals filed for this visit. BP Readings from Last 3 Encounters:  09/22/23 136/76  09/16/23 118/62  07/24/23 126/80   Wt Readings from Last 3 Encounters:  09/22/23 144 lb 9.6 oz (65.6 kg)  09/16/23 142 lb (64.4 kg)  08/25/23 140 lb (63.5 kg)   There is no height or weight on file to calculate BMI.    Physical Exam         Assessment & Plan:    See Problem List for Assessment and Plan of chronic medical problems.

## 2024-02-01 ENCOUNTER — Ambulatory Visit (INDEPENDENT_AMBULATORY_CARE_PROVIDER_SITE_OTHER): Admitting: Internal Medicine

## 2024-02-01 VITALS — BP 130/78 | HR 60 | Temp 98.0°F | Ht 63.0 in | Wt 142.0 lb

## 2024-02-01 DIAGNOSIS — I10 Essential (primary) hypertension: Secondary | ICD-10-CM | POA: Diagnosis not present

## 2024-02-01 DIAGNOSIS — M542 Cervicalgia: Secondary | ICD-10-CM

## 2024-02-01 MED ORDER — TIZANIDINE HCL 2 MG PO TABS: 2.0000 mg | ORAL_TABLET | Freq: Every evening | ORAL | 0 refills | Status: DC | PRN

## 2024-02-01 NOTE — Assessment & Plan Note (Signed)
 Chronic BP well controlled Continue hctz 12.5 mg daily

## 2024-02-01 NOTE — Patient Instructions (Addendum)
     Have a massage.      Medications changes include :   tizanidine 2-4 mg at bedtime

## 2024-02-01 NOTE — Assessment & Plan Note (Signed)
 Acute on chronic Has had some chronic neck pain for a while, but having increased pain in the right posterior neck that radiates toward the back.  No radiculopathy symptoms Has seen orthopedics last year and did PT which did not help She has been heating, facing and applying Biofreeze which does help some She may have some osteoarthritis, but I do think some of this is muscular in nature Tizanidine 2-4 mg at bedtime as needed She will try to get a massage which may help If no improvement she will follow-up with Dr. Katrinka Blazing downstairs

## 2024-02-20 DIAGNOSIS — Z1231 Encounter for screening mammogram for malignant neoplasm of breast: Secondary | ICD-10-CM | POA: Diagnosis not present

## 2024-02-20 LAB — HM MAMMOGRAPHY

## 2024-02-24 ENCOUNTER — Encounter: Payer: Self-pay | Admitting: Internal Medicine

## 2024-02-26 ENCOUNTER — Other Ambulatory Visit: Payer: Self-pay | Admitting: Internal Medicine

## 2024-03-13 ENCOUNTER — Other Ambulatory Visit: Payer: Self-pay | Admitting: Internal Medicine

## 2024-03-17 ENCOUNTER — Other Ambulatory Visit: Payer: Self-pay | Admitting: Internal Medicine

## 2024-03-21 ENCOUNTER — Ambulatory Visit: Payer: Medicare Other | Admitting: Internal Medicine

## 2024-03-29 DIAGNOSIS — Z23 Encounter for immunization: Secondary | ICD-10-CM | POA: Diagnosis not present

## 2024-04-14 DIAGNOSIS — C069 Malignant neoplasm of mouth, unspecified: Secondary | ICD-10-CM | POA: Diagnosis not present

## 2024-04-20 ENCOUNTER — Telehealth: Payer: Self-pay

## 2024-04-20 DIAGNOSIS — H04123 Dry eye syndrome of bilateral lacrimal glands: Secondary | ICD-10-CM | POA: Diagnosis not present

## 2024-04-20 DIAGNOSIS — H43813 Vitreous degeneration, bilateral: Secondary | ICD-10-CM | POA: Diagnosis not present

## 2024-04-20 DIAGNOSIS — H0102A Squamous blepharitis right eye, upper and lower eyelids: Secondary | ICD-10-CM | POA: Diagnosis not present

## 2024-04-20 DIAGNOSIS — Z961 Presence of intraocular lens: Secondary | ICD-10-CM | POA: Diagnosis not present

## 2024-04-20 LAB — HM DIABETES EYE EXAM

## 2024-04-20 NOTE — Telephone Encounter (Signed)
 Prolia  VOB initiated via [email or web address]  Next Prolia  inj DUE: 05/16/24

## 2024-04-20 NOTE — Telephone Encounter (Signed)
 Pt ready for scheduling for PROLIA  on or after : 05/16/24  Option# 1: Buy/Bill (Office supplied medication)  Out-of-pocket cost due at time of clinic visit: $0  Number of injection/visits approved: ---  Primary: MEDICARE Prolia  co-insurance: 0% Admin fee co-insurance: 0%  Secondary: [employer] [place] Prolia  co-insurance:  Admin fee co-insurance:   Medical Benefit Details: Date Benefits were checked: 04/20/24 Deductible: $257 Met of $257 Required/ Coinsurance: 0%/ Admin Fee: 0%  Prior Auth: N/A PA# Expiration Date:   # of doses approved: ----------------------------------------------------------------------- Option# 2- Med Obtained from pharmacy:  Pharmacy benefit: Copay $--- (Paid to pharmacy) Admin Fee: --- (Pay at clinic)  Prior Auth: --- PA# Expiration Date:   # of doses approved:   If patient wants fill through the pharmacy benefit please send prescription to: ---, and include estimated need by date in rx notes. Pharmacy will ship medication directly to the office.  Patient NOT eligible for Prolia  Copay Card. Copay Card can make patient's cost as little as $25. Link to apply: [email or web address]://www.[email or web address]  ** This summary of benefits is an estimation of the patient's out-of-pocket cost. Exact cost may very based on individual plan coverage.

## 2024-04-20 NOTE — Telephone Encounter (Signed)
 Hannah Bryant

## 2024-05-04 ENCOUNTER — Encounter: Payer: Self-pay | Admitting: Internal Medicine

## 2024-05-12 NOTE — Telephone Encounter (Signed)
 Called and spoke with patient regarding another matter, during call she had brought this question back up. Do we know an estimate price on the blood work if the insurance didn't cover it?

## 2024-05-13 ENCOUNTER — Ambulatory Visit

## 2024-06-06 DIAGNOSIS — M542 Cervicalgia: Secondary | ICD-10-CM | POA: Diagnosis not present

## 2024-06-06 DIAGNOSIS — M79672 Pain in left foot: Secondary | ICD-10-CM | POA: Diagnosis not present

## 2024-06-07 ENCOUNTER — Encounter: Payer: Self-pay | Admitting: Internal Medicine

## 2024-06-07 ENCOUNTER — Ambulatory Visit: Admitting: Internal Medicine

## 2024-06-07 VITALS — BP 144/70 | HR 66 | Ht 63.0 in | Wt 154.2 lb

## 2024-06-07 DIAGNOSIS — M810 Age-related osteoporosis without current pathological fracture: Secondary | ICD-10-CM | POA: Diagnosis not present

## 2024-06-07 DIAGNOSIS — E161 Other hypoglycemia: Secondary | ICD-10-CM

## 2024-06-07 DIAGNOSIS — E11649 Type 2 diabetes mellitus with hypoglycemia without coma: Secondary | ICD-10-CM

## 2024-06-07 DIAGNOSIS — E559 Vitamin D deficiency, unspecified: Secondary | ICD-10-CM | POA: Diagnosis not present

## 2024-06-07 LAB — COMPREHENSIVE METABOLIC PANEL WITH GFR
AG Ratio: 1.8 (calc) (ref 1.0–2.5)
ALT: 14 U/L (ref 6–29)
AST: 19 U/L (ref 10–35)
Albumin: 4.4 g/dL (ref 3.6–5.1)
Alkaline phosphatase (APISO): 56 U/L (ref 37–153)
BUN: 23 mg/dL (ref 7–25)
CO2: 32 mmol/L (ref 20–32)
Calcium: 9.5 mg/dL (ref 8.6–10.4)
Chloride: 103 mmol/L (ref 98–110)
Creat: 0.88 mg/dL (ref 0.60–1.00)
Globulin: 2.4 g/dL (ref 1.9–3.7)
Glucose, Bld: 95 mg/dL (ref 65–99)
Potassium: 3.9 mmol/L (ref 3.5–5.3)
Sodium: 142 mmol/L (ref 135–146)
Total Bilirubin: 0.5 mg/dL (ref 0.2–1.2)
Total Protein: 6.8 g/dL (ref 6.1–8.1)
eGFR: 67 mL/min/1.73m2

## 2024-06-07 LAB — LIPID PANEL W/REFLEX DIRECT LDL
Cholesterol: 162 mg/dL (ref <200)
HDL: 69 mg/dL (ref >=50)
LDL Cholesterol (Calc): 68 mg/dL
Non-HDL Cholesterol (Calc): 93 mg/dL (ref <130)
Total CHOL/HDL Ratio: 2.3 (calc) (ref <5.0)
Triglycerides: 167 mg/dL — ABNORMAL HIGH (ref <150)

## 2024-06-07 LAB — POCT GLYCOSYLATED HEMOGLOBIN (HGB A1C): Hemoglobin A1C: 6.6 % — AB (ref 4.0–5.6)

## 2024-06-07 LAB — VITAMIN D 25 HYDROXY (VIT D DEFICIENCY, FRACTURES): Vit D, 25-Hydroxy: 37 ng/mL (ref 30–100)

## 2024-06-07 NOTE — Progress Notes (Signed)
 Patient ID: Hannah Bryant, female   DOB: June 12, 1946, 78 y.o.   MRN: [id number]   HPI: Hannah Bryant is a 78 y.o.-year-old female, presenting for f/u for DM2, dx in ~2009 (or earlier), diet-controlled, with complications (reactive hypoglycemia ever since she was very young) and osteoporosis.  Previously seen by Dr. Tommas. Last 06/2016.  Last visit with me 79 months ago.  Interim history: She continues to have lower blood sugars in the afternoon, occasionally in the 40s-50s on the CGM, but higher when checked manually.  She sees a nutritionist. She also had some lows at night 2 months ago- started to eat a little PB + wafers at bedtime >> sugars normalized. Before last visit, she had investigation for dysphagia and esophageal food impaction.  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  She had resection (partial glossectomy) on 10/05/2023.  The cancer measured 2 mm was resected completely, but one of the 37 lymph nodes were positive for metastasis. She did not need RxTx. She will have a new scan in 07/2024.  Reactive hypoglycemia:  Reviewed history: Pt described that has had hypoglycemia every pm for ages. Sometimes: sweating, feeling hot, nausea, sometimes vomiting.  She may need to take a nap afterward.  She started to see Dr Lemont ([facility]) >> hypoglycemic episodes = better. She had an EGD, Ba swallow test >> she had a stricture at the site of her previous hiatal hernia with food retention in the Esophagus long after she eats (procedure: Spring 2016). She had Esophageal dilation. She also had a capsule endoscopy.  Her hypoglycemia improved after esophageal stretching.  DM2:  HbA1c levels reviewed: 09/21/2024: HbA1c 6.6% Lab Results  Component Value Date   HGBA1C 6.6 (A) 03/25/2023   HGBA1C 6.5 (A) 09/16/2022   HGBA1C 6.5 (A) 03/14/2022   She has previously on diabetic medications but she stopped that she was not feeling  well on them.  She would not want to restart.  She checks her sugars more than 4 times a day with her freestyle libre CGM:  Previously:  Lowest sugar was: 58 >> 40 >> 40s >> 40s. She has hypoglycemia awareness in the 80s. Highest sugar was 300s ...>> 262 >> 200 >> 200.  Glucometer: 4: OneTouch Verio and Freestyle  Meals: - B: banana + PB + muffin - L: whole wheat bread + Provolone or Hav[place] cheese + [place] or ham; chickpea puffs - D: chicken + veggies + cantaloupe She sees nutrition - started in 2016.  -No CKD, last BUN/creatinine:  Lab Results  Component Value Date   BUN 21 06/12/2023   CREATININE 0.66 06/12/2023   Lab Results  Component Value Date   MICRALBCREAT 24 07/25/2020   MICRALBCREAT 4.4 09/06/2008   -+ HL; last set of lipids: Lab Results  Component Value Date   CHOL 140 01/15/2023   HDL 63.40 01/15/2023   LDLCALC 53 01/15/2023   TRIG 119.0 01/15/2023   CHOLHDL 2 01/15/2023  On Lipitor 40.  Last eye exam: 04/20/2024: No DR  Last foot exam: 09/22/2023.  She has osteopenia on DXA but with her history of humerus fracture, she has clinical osteoporosis: She has a history of a humerus fracture in 01/2017 (however, this was not seen on x-ray...).  Reviewed her previous DXA scan reports: 01/18/2021 Lumbar spine L1- L3 (L4) Femoral neck (FN)  T-score  +1.2 RFN: -2.2 LFN: -2.2  Change in BMD from previous DXA test (%)  +5.1%* -  6.6%*  (*) statistically significant  02/08/2017 Lumbar spine (L1-L4) Femoral neck (FN)  T-score 0.6 RFN:-2.0 LFN:-1.6  Change in BMD from previous DXA test (%) Down 2.0% Down 4.3%    02/03/2021 Lumbar spine L1- L3 (L4) Femoral neck (FN)  T-score  +1.2 RFN: -2.2 LFN: -2.2  Change in BMD from previous DXA test (%)  +5.1%* -6.6%*  (*) statistically significant  I suggested Prolia  at previous visits and we discussed about benefits and possible side effects.  She prefered to wait until her dental implants were completed to decide about  this. Now she finished dental work (sees Dr. Donnice Dickens).  She finally started this on 03/24/2022 in PCPs office.  Vitamin D  insufficiency:  Reviewed vitamin D  levels: Lab Results  Component Value Date   VD25OH 43.36 03/25/2023   VD25OH 26.89 (L) 01/15/2023   VD25OH 37.31 01/07/2022   VD25OH 37.68 03/20/2021   VD25OH 38.19 01/22/2021   VD25OH 31.7 07/19/2020   VD25OH 97.50 09/09/2019   VD25OH 33.78 09/27/2018   VD25OH 24.24 (L) 03/24/2018   VD25OH 28.68 (L) 03/04/2017   She was on 1000 units vitamin D  daily >> stopped by PCP >> restarted 500 units daily.  Dose was subsequently increased to 2000 units daily.  ROS: + see HPI  I reviewed pt's medications, allergies, PMH, social hx, family hx, and changes were documented in the history of present illness. Otherwise, unchanged from my initial visit note.  History   Social History   Marital Status: Married    Spouse Name: N/A   Number of Children: 2   Social History Main Topics   Smoking status: Former Smoker    Types: Cigarettes    Quit date: 11/17/1980   Smokeless tobacco: Never Used     Comment: smoked 1962-1982, up to 1 ppd   Alcohol Use: 1.2 oz/week    2 Glasses of wine per week     Comment: socially,2 / week   Drug Use: No   Sexual Activity: Yes    Birth Control/ Protection: Post-menopausal   Social History Narrative   Daily caffeine use   Married   Metallurgist intermittently   Past Medical History:  Diagnosis Date   Abnormal finding on EKG    non specific T changes; short PR   Arthritis    Bursitis    tronchanteric   Complication of anesthesia    Severe PONV   Diabetes mellitus without complication (HCC)    Pt states she is pre-diabetic (12-06-13)   Diverticulosis    Gastroparesis    93 % retenton at 2 hrs   GERD (gastroesophageal reflux disease)    H/O dilation and curettage    Hernia    hiatal   Hiatal hernia    Hyperlipidemia    Hyperlipidemia    hypoglycemia    Tubulovillous  adenoma polyp of colon 06/2005   Hyperplastic 2007   Past Surgical History:  Procedure Laterality Date   CARDIAC CATHETERIZATION  2005   < 30 % lesion   COLONOSCOPY W/ POLYPECTOMY  2006 ,2007 & 2015   negative 2009; Dr Aneita   DILATATION & CURETTAGE/HYSTEROSCOPY WITH TRUECLEAR N/A 01/11/2014   Procedure: DILATATION & CURETTAGE/HYSTEROSCOPY WITH TRUCLEAR;  Surgeon: Evalene SHAUNNA Organ, MD;  Location: [facility];  Service: Gynecology;  Laterality: N/A;   ESOPHAGEAL MANOMETRY  03/22/15   Decreased peristalsis   ESOPHAGOGASTRODUODENOSCOPY (EGD) WITH PROPOFOL  N/A 06/12/2023   Procedure: ESOPHAGOGASTRODUODENOSCOPY (EGD) WITH PROPOFOL ;  Surgeon: Aneita Gwendlyn DASEN, MD;  Location: [facility];  Service: Gastroenterology;  Laterality: N/A;   Esophagram  03/29/15   Persistent narrowing at site of hiatal hernia; proximal dilation   FOREIGN BODY REMOVAL  06/12/2023   Procedure: FOREIGN BODY REMOVAL;  Surgeon: Aneita Gwendlyn DASEN, MD;  Location: [facility];  Service: Gastroenterology;;   G 3 P 3     HIATAL HERNIA REPAIR  2000   LEG SURGERY     Trauma LLE/pins and plates   myomectomy  1999   uterine fibroid   ROTATOR CUFF REPAIR Right 05/2017   TRIGGER FINGER RELEASE Right    TUBAL LIGATION     Upper endoscopy and esophageal dilation  02/20/15   Dr Lemont , [facility]   Current Outpatient Medications on File Prior to Visit  Medication Sig Dispense Refill   acarbose  (PRECOSE ) 25 MG tablet Take 1 tablet (25 mg total) by mouth 2 (two) times daily before a meal. 180 tablet 3   acetaminophen (TYLENOL ARTHRITIS PAIN) 650 MG CR tablet Take 650 mg by mouth. 2 by mouth 2 times daily     aspirin 81 MG chewable tablet Chew 81 mg by mouth daily.     atorvastatin  (LIPITOR) 40 MG tablet TAKE 1 TABLET BY MOUTH EVERY DAY RETURN IN ABOUT 6 MONTHS (AROUND 07/15/2023) FOR FOLLOW UP. 90 tablet 0   chlorhexidine (PERIDEX) 0.12 % solution SMARTSIG:By Mouth     Continuous Glucose Receiver (FREESTYLE LIBRE 3 READER) DEVI USE AS DIRECTED 1  each 0   Continuous Glucose Sensor (FREESTYLE LIBRE 3 PLUS SENSOR) MISC Inject 1 Device into the skin continuous. Change sensor every 15 days 6 each 3   Cyanocobalamin (B-12) 1000 MCG TABS Take 1,000 mcg by mouth.     denosumab  (PROLIA ) 60 MG/ML SOSY injection Inject 60 mg into the skin every 6 (six) months.     esomeprazole  (NEXIUM ) 40 MG capsule Take 1 capsule (40 mg total) by mouth daily. --- Needs office visit before further refills    30 capsule 2   fexofenadine (ALLEGRA) 180 MG tablet Take 180 mg by mouth daily.     glucose blood (FREESTYLE TEST STRIPS) test strip FreeStyle Precision Neo test strips.  Use as instructed to check blood sugar 1 time a day as a back up for the Freestyle Libre CGM 100 each 12   glucose blood test strip Freestyle freedom test strip Use as instructed to check sugars E11.9 50 each 12   hydrochlorothiazide  (MICROZIDE ) 12.5 MG capsule TAKE 1 CAPSULE (12.5 MG TOTAL) BY MOUTH DAILY. PER MD RETURN IN ABOUT 6 MONTHS (AROUND 07/15/2023) FOR FOLLOW UP. 90 capsule 0   hyoscyamine  (LEVSIN  SL) 0.125 MG SL tablet DISSOLVE 1-2 TABLETS UNDER TONGUE EVERY 4-6 HOURS AS NEEDED FOR ABDOMINAL PAIN AND CRAMPING 900 tablet 3   Lancets MISC by Does not apply route. Lifespan ultra 2 test strips and lancets     lidocaine  (XYLOCAINE ) 2 % solution SMARTSIG:By Mouth     Meclizine  HCl (BONINE PO) Take by mouth.     montelukast  (SINGULAIR ) 10 MG tablet TAKE 1 TABLET BY MOUTH EVERY DAY AS NEEDED RETURN IN ABOUT 6 MONTHS (AROUND 07/15/2023) FOR FOLLOW UP. 90 tablet 0   ondansetron  (ZOFRAN  ODT) 4 MG disintegrating tablet Take 1 tablet (4 mg total) by mouth every 8 (eight) hours as needed for nausea or vomiting. 30 tablet 0   OVER THE COUNTER MEDICATION multistrain probiotic     tiZANidine  (ZANAFLEX ) 2 MG tablet TAKE 1 TO 2 TABLETS (2-4 MG TOTAL) BY MOUTH AT  BEDTIME AS NEEDED FOR MUSCLE SPASM 180 tablet 1   VITAMIN D , CHOLECALCIFEROL, PO Take 2,000 Units by mouth daily.     Current  Facility-Administered Medications on File Prior to Visit  Medication Dose Route Frequency Provider Last Rate Last Admin   denosumab  (PROLIA ) injection 60 mg  60 mg Subcutaneous Once Geofm Glade PARAS, MD       Allergies  Allergen Reactions   Domperidone     Stomach cramps   Fentanyl  Nausea And Vomiting   Monistat [Miconazole Nitrate]     SEVERE BURNING   Nsaids     REACTION: stomach complaints   Oxycodone Hcl     REACTION: VOMITTING   Codeine     nausea   Erythromycin Ethylsuccinate     gastritis   Family History  Problem Relation Age of Onset   Ulcers Father    Diabetes Father    Hypertension Father    Heart attack Father        > 41   Stroke Father        in 65s   Heart attack Mother 49   Diabetes Sister    Heart attack Sister 66       CABG X 4   Cancer Neg Hx    Colitis Neg Hx    PE: BP (!) 144/70   Pulse 66   Ht 5' 3 (1.6 m)   Wt 154 lb 3.2 oz (69.9 kg)   LMP 11/18/2000   SpO2 96%   BMI 27.32 kg/m   Wt Readings from Last 3 Encounters:  06/07/24 154 lb 3.2 oz (69.9 kg)  02/01/24 142 lb (64.4 kg)  09/22/23 144 lb 9.6 oz (65.6 kg)   Constitutional: normal weight, in NAD Eyes:  EOMI, no exophthalmos ENT: no neck masses, no cervical lymphadenopathy Cardiovascular: RRR, No MRG Respiratory: CTA B Musculoskeletal: no deformities Skin:no rashes Neurological: no tremor with outstretched hands Diabetic Foot Exam - Simple   Simple Foot Form Diabetic Foot exam was performed with the following findings: Yes 06/07/2024  4:24 PM  Visual Inspection No deformities, no ulcerations, no other skin breakdown bilaterally: Yes Sensation Testing Intact to touch and monofilament testing bilaterally: Yes Pulse Check Posterior Tibialis and Dorsalis pulse intact bilaterally: Yes Comments + B hallux valgum    ASSESSMENT: 1. Diet-controlled DM2, with complications - hypoglycemia  2. Hypoglycemia - possibly reactive - Possibly related to a history of gastroparesis -  improved after her esophageal stretching - We ruled out adrenal insufficiency: Component     Latest Ref Rng 02/02/2015 02/02/2015 02/02/2015         9:21 AM 10:02 AM 10:02 AM  Cortisol, Plasma      7.1 29.1 24.2  C206 ACTH      6 - 50 pg/mL 12     3.  Clinical osteoporosis  4.  Vitamin D  insufficiency  PLAN:  1. DM2 and 2. History of reactive/postprandial hypoglycemia - Diet controlled -At previous visits we discussed about the guidelines not recommending CGM monitoring for patients with reactive hypoglycemia due to increase anxiety.  I explained that an occasional drop in blood sugars under 70 may not be abnormal, especially on the CGM, which may show her readings that are 20 to 30 mg/dL lower than they actually are and also, especially since she is not feeling them.  It is likely that her body is able to correct these.  This does not mean that she should not improve her diet to avoid hyperglycemic spikes, for  example eliminating concentrated sweets (discussed about examples), since these decrease in glucose levels appear to happen after an initial hyperglycemic peak.  However, she appears that she is usually trying to chase the low blood sugars throughout the day and this appears to be decreasing her quality of life.  She mentions that she is not going out to eat at night if her sugars are low. -At previous visits I did not suggest therapeutic intervention (there is no approved treatment available for this condition, rather, dietary changes are necessary).  I did recommend having carbs with her just in case she needed to correct a low blood sugar.  I did advise her to try not to correct the blood sugars immediately but allow the body to correct them.  She did notice that allowing the blood sugars to drop a little bit more before correcting them actually helped.  She is currently not fighting with the blood sugars as much as before.  She was still correcting blood sugars lower than 80 and we  discussed about trying to correct only if lower than 70s or 60s.  I also recommended to avoid high glycemic index foods and include healthy fats with meals to avoid blood sugar fluctuations.  Afterwards, sugars started to fluctuate mainly within the target range but she had some lower blood sugars in the afternoon and early evening.  These appeared after an initial peaking blood sugar and I suspected that she was trying to correct normal blood sugar with high glycemic index carbs.  However, she was very upset about the blood sugar fluctuations and we discussed that we could try acarbose  taken before lunch and maybe 1 or both of the other meals, depending on the effect.  I discussed with her about the mechanism of action and possible side effects (GI).  She and her husband discussed about this but she opted not to try it as she was afraid that this would slow down her GI tract.  We discussed that this was used to block the absorption of carbs, but did not slow the gastric emptying down.  - She and her husband also also discussed that she may need a second opinion at [facility], however, they did not go through with this. - At last visit, sugars were mostly at goal overnight but quite fluctuating later in the day, with hyperglycemic spikes up to 250s and also lows, sometimes down to the 40s.  Again, reviewing the tracings, her lows usually appears to be after a hyperglycemic spike.  Upon questioning, she was eating pured foods, mostly carbs.  We discussed about including healthy fats like avocado or ground flaxseed.  I advised her to discuss with nutrition about this.  HbA1c at last visit was 6.6%, stable. CGM interpretation: - At today's visit, we reviewed her CGM downloads: It appears that 95% of values are in target range (goal >70%), while 5% are higher than 180 (goal <25%), and 0% are lower than 70 (goal <4%).  The calculated average blood sugar is 132.  The projected HbA1c for the next 3 months (GMI) is  6.5. - Reviewing the CGM trends, sugars appear to be fluctuating within a relatively narrow range in the target interval, very stable overnight, but with more variability later in the day.  She still has instances in which her sugars are dropping down to the 40s on the CGM and she corrects them, but they start dropping again afterwards.  We discussed about eating a snack with protein and fat  after correction of a low to hold her blood sugars up.  Otherwise, she is doing a good job adjusting her meals to include more fat and protein per her nutritionist advice.  - she does feel poorly when the sugars drop too low, with mental fog and dizziness.  She is trying to prevent the blood sugars from dropping before she develops these symptoms.  We did discuss that the sensor blood sugars  can be more alarming than fingersticks but she would like to continue with the sensor for now.  She is checking the blood sugars manually when she gets a low on the CGM. - we checked her HbA1c: 6.6% (stable) - advised to check sugars at different times of the day - 4x a day with her CGM - advised for yearly eye exams >> she is UTD - will check annual labs today - return to clinic in 6 months  3.  Clinical osteoporosis (history of fractures in the setting of osteopenia) - This is managed by PCP -Likely age-related + postmenopausal -No falls or fractures since last visit -She does qualify for pharmaceutical treatment as she has clinical osteoporosis (osteopenia on the bone density scan + history of humeral fracture) -On the last bone density scan she had low femoral neck bone density -She is currently on Prolia  started 03/19/2022.  She gets this through PCPs office.  She does not particularly like the medication and regrets starting it  4.  Vitamin D  insufficiency - She had a high normal vitamin D  level in the past, at 97.5, so we stopped her vitamin D  supplement at that time - Afterwards, she started back on vitamin D ,  currently 2000 units daily - On this dose, latest vitamin D  level was normal: Lab Results  Component Value Date   VD25OH 43.36 03/25/2023  - will check another vitamin D  level today  Lela Fendt, MD PhD [facility] Endocrinology

## 2024-06-07 NOTE — Patient Instructions (Addendum)
Please come back for a follow-up appointment in 6 months.   

## 2024-06-08 ENCOUNTER — Ambulatory Visit: Payer: Self-pay | Admitting: Internal Medicine

## 2024-06-08 ENCOUNTER — Ambulatory Visit (INDEPENDENT_AMBULATORY_CARE_PROVIDER_SITE_OTHER)

## 2024-06-08 DIAGNOSIS — M810 Age-related osteoporosis without current pathological fracture: Secondary | ICD-10-CM | POA: Diagnosis not present

## 2024-06-08 LAB — MICROALBUMIN / CREATININE URINE RATIO
Creatinine, Urine: 141 mg/dL (ref 20–275)
Microalb, Ur: <0.2 mg/dL

## 2024-06-08 NOTE — Progress Notes (Signed)
 Patient received her prolia  injection today and responded well

## 2024-06-12 ENCOUNTER — Other Ambulatory Visit: Payer: Self-pay | Admitting: Internal Medicine

## 2024-06-14 DIAGNOSIS — M7662 Achilles tendinitis, left leg: Secondary | ICD-10-CM | POA: Diagnosis not present

## 2024-06-14 DIAGNOSIS — M47892 Other spondylosis, cervical region: Secondary | ICD-10-CM | POA: Diagnosis not present

## 2024-06-15 DIAGNOSIS — M47892 Other spondylosis, cervical region: Secondary | ICD-10-CM | POA: Diagnosis not present

## 2024-06-15 DIAGNOSIS — M7662 Achilles tendinitis, left leg: Secondary | ICD-10-CM | POA: Diagnosis not present

## 2024-06-16 ENCOUNTER — Ambulatory Visit: Admitting: Internal Medicine

## 2024-07-01 DIAGNOSIS — M47892 Other spondylosis, cervical region: Secondary | ICD-10-CM | POA: Diagnosis not present

## 2024-07-01 DIAGNOSIS — M7662 Achilles tendinitis, left leg: Secondary | ICD-10-CM | POA: Diagnosis not present

## 2024-07-04 DIAGNOSIS — M7662 Achilles tendinitis, left leg: Secondary | ICD-10-CM | POA: Diagnosis not present

## 2024-07-04 DIAGNOSIS — M47892 Other spondylosis, cervical region: Secondary | ICD-10-CM | POA: Diagnosis not present

## 2024-07-06 DIAGNOSIS — M7662 Achilles tendinitis, left leg: Secondary | ICD-10-CM | POA: Diagnosis not present

## 2024-07-06 DIAGNOSIS — M47892 Other spondylosis, cervical region: Secondary | ICD-10-CM | POA: Diagnosis not present

## 2024-07-12 DIAGNOSIS — M47892 Other spondylosis, cervical region: Secondary | ICD-10-CM | POA: Diagnosis not present

## 2024-07-12 DIAGNOSIS — M7662 Achilles tendinitis, left leg: Secondary | ICD-10-CM | POA: Diagnosis not present

## 2024-07-15 DIAGNOSIS — M47892 Other spondylosis, cervical region: Secondary | ICD-10-CM | POA: Diagnosis not present

## 2024-07-15 DIAGNOSIS — M7662 Achilles tendinitis, left leg: Secondary | ICD-10-CM | POA: Diagnosis not present

## 2024-07-21 DIAGNOSIS — M47892 Other spondylosis, cervical region: Secondary | ICD-10-CM | POA: Diagnosis not present

## 2024-07-21 DIAGNOSIS — M7662 Achilles tendinitis, left leg: Secondary | ICD-10-CM | POA: Diagnosis not present

## 2024-07-25 ENCOUNTER — Encounter: Payer: Self-pay | Admitting: Internal Medicine

## 2024-07-25 DIAGNOSIS — M7662 Achilles tendinitis, left leg: Secondary | ICD-10-CM | POA: Diagnosis not present

## 2024-07-25 DIAGNOSIS — M47892 Other spondylosis, cervical region: Secondary | ICD-10-CM | POA: Diagnosis not present

## 2024-07-26 ENCOUNTER — Other Ambulatory Visit: Payer: Self-pay

## 2024-07-26 DIAGNOSIS — M47892 Other spondylosis, cervical region: Secondary | ICD-10-CM | POA: Diagnosis not present

## 2024-07-26 DIAGNOSIS — M7662 Achilles tendinitis, left leg: Secondary | ICD-10-CM | POA: Diagnosis not present

## 2024-07-26 MED ORDER — COVID-19 MRNA VAC-TRIS(PFIZER) 30 MCG/0.3ML IM SUSY: 0.3000 mL | PREFILLED_SYRINGE | Freq: Once | INTRAMUSCULAR | 0 refills | Status: AC

## 2024-08-02 DIAGNOSIS — Z23 Encounter for immunization: Secondary | ICD-10-CM | POA: Diagnosis not present

## 2024-08-09 ENCOUNTER — Other Ambulatory Visit: Payer: Self-pay | Admitting: Internal Medicine

## 2024-08-11 DIAGNOSIS — C069 Malignant neoplasm of mouth, unspecified: Secondary | ICD-10-CM | POA: Diagnosis not present

## 2024-08-11 DIAGNOSIS — R918 Other nonspecific abnormal finding of lung field: Secondary | ICD-10-CM | POA: Diagnosis not present

## 2024-08-13 DIAGNOSIS — Z23 Encounter for immunization: Secondary | ICD-10-CM | POA: Diagnosis not present

## 2024-08-15 ENCOUNTER — Ambulatory Visit: Payer: Self-pay

## 2024-08-15 NOTE — Telephone Encounter (Signed)
 FYI Only or Action Required?: FYI only for provider.  Patient was last seen in primary care on 02/01/2024 by Geofm Glade PARAS, MD.  Called Nurse Triage reporting Abdominal Pain.  Symptoms began several years ago.  Interventions attempted: Rest, hydration, or home remedies.  Symptoms are: unchanged.  Triage Disposition: See Physician Within 24 Hours  Patient/caregiver understands and will follow disposition?: Yes   Copied from CRM #[id number]. Topic: Clinical - Red Word Triage >> Aug 15, 2024  9:11 AM Antony RAMAN wrote: Red Word that prompted transfer to Nurse Triage: pain in lower left side, havent done anything to pull anything pt stated Reason for Disposition  [1] MILD pain (e.g., does not interfere with normal activities) AND [2] pain comes and goes (cramps) AND [3] present > 48 hours  (Exception: This same abdominal pain is a chronic symptom recurrent or ongoing AND present > 4 weeks.)  Answer Assessment - Initial Assessment Questions 1. LOCATION: Where does it hurt?      Upper and Lower Left Side  2. RADIATION: Does the pain shoot anywhere else? (e.g., chest, back)     Left Hip  3. ONSET: When did the pain begin? (e.g., minutes, hours or days ago)      Chronic, Years  4. SUDDEN: Gradual or sudden onset?     Gradual  5. PATTERN Does the pain come and go, or is it constant?     Intermittent  6. SEVERITY: How bad is the pain?  (e.g., Scale 1-10; mild, moderate, or severe)     2-3  7. RECURRENT SYMPTOM: Have you ever had this type of stomach pain before? If Yes, ask: When was the last time? and What happened that time?      Diverticulitis  8. CAUSE: What do you think is causing the stomach pain? (e.g., gallstones, recent abdominal surgery)      Unsure  9. RELIEVING/AGGRAVATING FACTORS: What makes it better or worse? (e.g., antacids, bending or twisting motion, bowel movement)     Sitting or lying down makes it better  10. OTHER SYMPTOMS: Do you  have any other symptoms? (e.g., back pain, diarrhea, fever, urination pain, vomiting)       Denies any other symptoms  11. PREGNANCY: Is there any chance you are pregnant? When was your last menstrual period?       No and No  Patient states she has a history of Diverticulitis  Protocols used: Abdominal Pain - Female-A-AH

## 2024-08-16 ENCOUNTER — Encounter: Payer: Self-pay | Admitting: Internal Medicine

## 2024-08-16 ENCOUNTER — Ambulatory Visit: Payer: Self-pay | Admitting: Internal Medicine

## 2024-08-16 ENCOUNTER — Ambulatory Visit (INDEPENDENT_AMBULATORY_CARE_PROVIDER_SITE_OTHER): Admitting: Internal Medicine

## 2024-08-16 VITALS — BP 122/80 | HR 78 | Temp 98.6°F | Ht 63.0 in | Wt 154.0 lb

## 2024-08-16 DIAGNOSIS — I10 Essential (primary) hypertension: Secondary | ICD-10-CM

## 2024-08-16 DIAGNOSIS — E7800 Pure hypercholesterolemia, unspecified: Secondary | ICD-10-CM | POA: Insufficient documentation

## 2024-08-16 DIAGNOSIS — E559 Vitamin D deficiency, unspecified: Secondary | ICD-10-CM | POA: Diagnosis not present

## 2024-08-16 DIAGNOSIS — R1032 Left lower quadrant pain: Secondary | ICD-10-CM | POA: Diagnosis not present

## 2024-08-16 LAB — CBC WITH DIFFERENTIAL/PLATELET
Basophils Absolute: 0 K/uL (ref 0.0–0.1)
Basophils Relative: 0.4 % (ref 0.0–3.0)
Eosinophils Absolute: 0.1 K/uL (ref 0.0–0.7)
Eosinophils Relative: 1.5 % (ref 0.0–5.0)
HCT: 41.1 % (ref 36.0–46.0)
Hemoglobin: 13.7 g/dL (ref 12.0–15.0)
Lymphocytes Relative: 30.5 % (ref 12.0–46.0)
Lymphs Abs: 2.3 K/uL (ref 0.7–4.0)
MCHC: 33.5 g/dL (ref 30.0–36.0)
MCV: 95.2 fl (ref 78.0–100.0)
Monocytes Absolute: 0.5 K/uL (ref 0.1–1.0)
Monocytes Relative: 6.6 % (ref 3.0–12.0)
Neutro Abs: 4.5 K/uL (ref 1.4–7.7)
Neutrophils Relative %: 61 % (ref 43.0–77.0)
Platelets: 148 K/uL — ABNORMAL LOW (ref 150.0–400.0)
RBC: 4.31 Mil/uL (ref 3.87–5.11)
RDW: 13.7 % (ref 11.5–15.5)
WBC: 7.4 K/uL (ref 4.0–10.5)

## 2024-08-16 LAB — URINALYSIS, ROUTINE W REFLEX MICROSCOPIC
Hgb urine dipstick: NEGATIVE
Nitrite: NEGATIVE
Specific Gravity, Urine: 1.025 (ref 1.000–1.030)
Urine Glucose: NEGATIVE
Urobilinogen, UA: 1 (ref 0.0–1.0)
pH: 6 (ref 5.0–8.0)

## 2024-08-16 LAB — HEPATIC FUNCTION PANEL
ALT: 15 U/L (ref 0–35)
AST: 20 U/L (ref 0–37)
Albumin: 4.4 g/dL (ref 3.5–5.2)
Alkaline Phosphatase: 39 U/L (ref 39–117)
Bilirubin, Direct: 0.1 mg/dL (ref 0.0–0.3)
Total Bilirubin: 0.6 mg/dL (ref 0.2–1.2)
Total Protein: 6.9 g/dL (ref 6.0–8.3)

## 2024-08-16 LAB — BASIC METABOLIC PANEL WITH GFR
BUN: 15 mg/dL (ref 6–23)
CO2: 33 meq/L — ABNORMAL HIGH (ref 19–32)
Calcium: 9.5 mg/dL (ref 8.4–10.5)
Chloride: 101 meq/L (ref 96–112)
Creatinine, Ser: 0.71 mg/dL (ref 0.40–1.20)
GFR: 80.69 mL/min (ref >60.00)
Glucose, Bld: 111 mg/dL — ABNORMAL HIGH (ref 70–99)
Potassium: 3.5 meq/L (ref 3.5–5.1)
Sodium: 142 meq/L (ref 135–145)

## 2024-08-16 LAB — LIPASE: Lipase: 9 U/L — ABNORMAL LOW (ref 11.0–59.0)

## 2024-08-16 NOTE — Progress Notes (Signed)
 The test results show that your current treatment is OK, as the tests are stable.  Please continue the same plan.  There is no other need for change of treatment or further evaluation based on these results, at this time.  thanks

## 2024-08-16 NOTE — Assessment & Plan Note (Signed)
 BP Readings from Last 3 Encounters:  08/16/24 122/80  06/07/24 (!) 144/70  02/01/24 130/78   Stable, pt to continue medical treatment hct 12.5 mg qd

## 2024-08-16 NOTE — Patient Instructions (Signed)
 Please continue all other medications as before, and refills have been done if requested.  Please have the pharmacy call with any other refills you may need.  Please keep your appointments with your specialists as you may have planned  You will be contacted regarding the referral for: CT renal (no contrast)  Please go to the LAB at the blood drawing area for the tests to be done  You will be contacted by phone if any changes need to be made immediately.  Otherwise, you will receive a letter about your results with an explanation, but please check with MyChart first.

## 2024-08-16 NOTE — Progress Notes (Signed)
 Patient ID: Hannah Bryant, female   DOB: [birth year], 78 y.o.   MRN: [id number]        Chief Complaint: follow up with left sided flank and side pain, htn, low vit d       HPI:  Hannah Bryant is a 78 y.o. female here with hx of diverticulosis,  GERD s/p nissen, colon polyps, esophageal dysmotility, gastroparesis, and tongue cancer, now with 2 days onset mild to  mod pain starting near the left flank and seems to radiate towards the LLQ.  Denies urinary symptoms such as dysuria, frequency, urgency, flank pain, hematuria or n/v, fever, chills.  .Denies worsening reflux, dysphagia, n/v, bowel change or blood. Np hx of renal stone  Did have fairly recent PT CT and CT chest without apparent recurrence of malignancy        Wt Readings from Last 3 Encounters:  08/16/24 154 lb (69.9 kg)  06/07/24 154 lb 3.2 oz (69.9 kg)  02/01/24 142 lb (64.4 kg)   BP Readings from Last 3 Encounters:  08/16/24 122/80  06/07/24 (!) 144/70  02/01/24 130/78         Past Medical History:  Diagnosis Date   Abnormal finding on EKG    non specific T changes; short PR   Arthritis    Bursitis    tronchanteric   Complication of anesthesia    Severe PONV   Diabetes mellitus without complication (HCC)    Pt states she is pre-diabetic (12-06-13)   Diverticulosis    Gastroparesis    93 % retenton at 2 hrs   GERD (gastroesophageal reflux disease)    H/O dilation and curettage    Hernia    hiatal   Hiatal hernia    Hyperlipidemia    Hyperlipidemia    hypoglycemia    Tubulovillous adenoma polyp of colon 06/2005   Hyperplastic 2007   Past Surgical History:  Procedure Laterality Date   CARDIAC CATHETERIZATION  2005   < 30 % lesion   COLONOSCOPY W/ POLYPECTOMY  2006 ,2007 & 2015   negative 2009; Dr Aneita   DILATATION & CURETTAGE/HYSTEROSCOPY WITH TRUECLEAR N/A 01/11/2014   Procedure: DILATATION & CURETTAGE/HYSTEROSCOPY WITH TRUCLEAR;  Surgeon: Evalene SHAUNNA Organ, MD;  Location: [facility];  Service: Gynecology;   Laterality: N/A;   ESOPHAGEAL MANOMETRY  03/22/15   Decreased peristalsis   ESOPHAGOGASTRODUODENOSCOPY (EGD) WITH PROPOFOL  N/A 06/12/2023   Procedure: ESOPHAGOGASTRODUODENOSCOPY (EGD) WITH PROPOFOL ;  Surgeon: Aneita Gwendlyn DASEN, MD;  Location: [facility];  Service: Gastroenterology;  Laterality: N/A;   Esophagram  03/29/15   Persistent narrowing at site of hiatal hernia; proximal dilation   FOREIGN BODY REMOVAL  06/12/2023   Procedure: FOREIGN BODY REMOVAL;  Surgeon: Aneita Gwendlyn DASEN, MD;  Location: [facility];  Service: Gastroenterology;;   G 3 P 3     HIATAL HERNIA REPAIR  2000   LEG SURGERY     Trauma LLE/pins and plates   myomectomy  1999   uterine fibroid   ROTATOR CUFF REPAIR Right 05/2017   TRIGGER FINGER RELEASE Right    TUBAL LIGATION     Upper endoscopy and esophageal dilation  02/20/15   Dr Lemont , [facility]    reports that she quit smoking about 43 years ago. Her smoking use included cigarettes. She has never used smokeless tobacco. She reports current alcohol use of about 2.0 standard drinks of alcohol per week. She reports that she does not use drugs. family history includes Diabetes in her father and  sister; Heart attack in her father; Heart attack (age of onset: 4) in her mother and sister; Hypertension in her father; Stroke in her father; Ulcers in her father. Allergies  Allergen Reactions   Oxycodone Hcl Nausea Only and Other (See Comments)   Tolmetin Other (See Comments)    REACTION: stomach complaints   Domperidone     Stomach cramps   Fentanyl  Nausea And Vomiting   Hydrocodone Other (See Comments)   Monistat [Miconazole Nitrate]     SEVERE BURNING   Nsaids     REACTION: stomach complaints   Codeine     nausea   Erythromycin Ethylsuccinate     gastritis   Current Outpatient Medications on File Prior to Visit  Medication Sig Dispense Refill   acetaminophen (TYLENOL ARTHRITIS PAIN) 650 MG CR tablet Take 650 mg by mouth. 2 by mouth 2 times daily     aspirin 81 MG  chewable tablet Chew 81 mg by mouth daily.     atorvastatin  (LIPITOR) 40 MG tablet TAKE 1 TABLET BY MOUTH EVERY DAY RETURN IN ABOUT 6 MONTHS (AROUND 07/15/2023) FOR FOLLOW UP. 90 tablet 0   Continuous Glucose Receiver (FREESTYLE LIBRE 3 READER) DEVI USE AS DIRECTED 1 each 0   Continuous Glucose Sensor (FREESTYLE LIBRE 3 PLUS SENSOR) MISC INJECT 1 DEVICE INTO THE SKIN CONTINUOUS. CHANGE SENSOR EVERY 15 DAYS 6 each 3   Cyanocobalamin (B-12) 1000 MCG TABS Take 1,000 mcg by mouth.     denosumab  (PROLIA ) 60 MG/ML SOSY injection Inject 60 mg into the skin every 6 (six) months.     esomeprazole  (NEXIUM ) 40 MG capsule Take 1 capsule (40 mg total) by mouth daily. --- Needs office visit before further refills    30 capsule 2   fexofenadine (ALLEGRA) 180 MG tablet Take 180 mg by mouth daily.     glucose blood (FREESTYLE TEST STRIPS) test strip FreeStyle Precision Neo test strips.  Use as instructed to check blood sugar 1 time a day as a back up for the Freestyle Libre CGM 100 each 12   glucose blood test strip Freestyle freedom test strip Use as instructed to check sugars E11.9 50 each 12   hydrochlorothiazide  (MICROZIDE ) 12.5 MG capsule TAKE 1 CAPSULE (12.5 MG TOTAL) BY MOUTH DAILY. PER MD RETURN IN ABOUT 6 MONTHS (AROUND 07/15/2023) FOR FOLLOW UP. 90 capsule 0   hyoscyamine  (LEVSIN  SL) 0.125 MG SL tablet DISSOLVE 1-2 TABLETS UNDER TONGUE EVERY 4-6 HOURS AS NEEDED FOR ABDOMINAL PAIN AND CRAMPING 900 tablet 3   Lancets MISC by Does not apply route. Lifespan ultra 2 test strips and lancets     methocarbamol (ROBAXIN) 500 MG tablet Take 500 mg by mouth 3 (three) times daily as needed.     montelukast  (SINGULAIR ) 10 MG tablet TAKE 1 TABLET BY MOUTH EVERY DAY AS NEEDED RETURN IN ABOUT 6 MONTHS (AROUND 07/15/2023) FOR FOLLOW UP. 90 tablet 0   OVER THE COUNTER MEDICATION multistrain probiotic     PRIORIX injection      tiZANidine  (ZANAFLEX ) 2 MG tablet TAKE 1 TO 2 TABLETS (2-4 MG TOTAL) BY MOUTH AT BEDTIME AS NEEDED  FOR MUSCLE SPASM 180 tablet 1   VITAMIN D , CHOLECALCIFEROL, PO Take 2,000 Units by mouth daily.     No current facility-administered medications on file prior to visit.        ROS:  All others reviewed and negative.  Objective        PE:  BP 122/80 (BP Location: Right Arm, Patient  Position: Sitting, Cuff Size: Normal)   Pulse 78   Temp 98.6 F (37 C) (Oral)   Ht 5' 3 (1.6 m)   Wt 154 lb (69.9 kg)   LMP 11/18/2000   SpO2 91%   BMI 27.28 kg/m                 Constitutional: Pt appears in NAD               HENT: Head: NCAT.                Right Ear: External ear normal.                 Left Ear: External ear normal.                Eyes: . Pupils are equal, round, and reactive to light. Conjunctivae and EOM are normal               Nose: without d/c or deformity               Neck: Neck supple. Gross normal ROM               Cardiovascular: Normal rate and regular rhythm.                 Pulmonary/Chest: Effort normal and breath sounds without rales or wheezing.                Abd:  Soft, NT, ND, + BS, no organomegaly               Neurological: Pt is alert. At baseline orientation, motor grossly intact               Skin: Skin is warm. No rashes, no other new lesions, LE edema - none               Psychiatric: Pt behavior is normal without agitation   Micro: none  Cardiac tracings I have personally interpreted today:  none  Pertinent Radiological findings (summarize): none   Lab Results  Component Value Date   WBC 7.4 08/16/2024   HGB 13.7 08/16/2024   HCT 41.1 08/16/2024   PLT 148.0 (L) 08/16/2024   GLUCOSE 111 (H) 08/16/2024   CHOL 162 06/07/2024   TRIG 167 (H) 06/07/2024   HDL 69 06/07/2024   LDLCALC 68 06/07/2024   ALT 15 08/16/2024   AST 20 08/16/2024   NA 142 08/16/2024   K 3.5 08/16/2024   CL 101 08/16/2024   CREATININE 0.71 08/16/2024   BUN 15 08/16/2024   CO2 33 (H) 08/16/2024   TSH 1.12 09/09/2019   HGBA1C 6.6 (A) 06/07/2024   MICROALBUR <0.2  06/07/2024   Assessment/Plan:  Hannah Bryant is a 78 y.o. White or Caucasian [1] female with  has a past medical history of Abnormal finding on EKG, Arthritis, Bursitis, Complication of anesthesia, Diabetes mellitus without complication (HCC), Diverticulosis, Gastroparesis, GERD (gastroesophageal reflux disease), H/O dilation and curettage, Hernia, Hiatal hernia, Hyperlipidemia, Hyperlipidemia, hypoglycemia, and Tubulovillous adenoma polyp of colon (06/2005).  Vitamin D  insufficiency Last vitamin D  Lab Results  Component Value Date   VD25OH 37 06/07/2024   Low, to start oral replacement   Essential hypertension, benign BP Readings from Last 3 Encounters:  08/16/24 122/80  06/07/24 (!) 144/70  02/01/24 130/78   Stable, pt to continue medical treatment hct 12.5 mg qd   Left lower quadrant abdominal pain With recent acute worsening, for  cbc with lab and ua, also for CT renal r/o stone or diverticulitis but appears non toxic  Followup: Return if symptoms worsen or fail to improve.  Lynwood Rush, MD 08/16/2024 9:45 PM [facility]

## 2024-08-16 NOTE — Assessment & Plan Note (Signed)
 Last vitamin D  Lab Results  Component Value Date   VD25OH 37 06/07/2024   Low, to start oral replacement

## 2024-08-16 NOTE — Assessment & Plan Note (Signed)
 With recent acute worsening, for cbc with lab and ua, also for CT renal r/o stone or diverticulitis but appears non toxic

## 2024-08-17 ENCOUNTER — Encounter: Payer: Self-pay | Admitting: Internal Medicine

## 2024-08-17 ENCOUNTER — Ambulatory Visit: Payer: Self-pay

## 2024-08-17 ENCOUNTER — Ambulatory Visit (HOSPITAL_COMMUNITY)
Admission: RE | Admit: 2024-08-17 | Discharge: 2024-08-17 | Disposition: A | Discharge status: Home / Self Care | Source: Ambulatory Visit | Attending: Internal Medicine | Admitting: Internal Medicine

## 2024-08-17 DIAGNOSIS — K5730 Diverticulosis of large intestine without perforation or abscess without bleeding: Secondary | ICD-10-CM | POA: Diagnosis not present

## 2024-08-17 DIAGNOSIS — R109 Unspecified abdominal pain: Secondary | ICD-10-CM | POA: Diagnosis not present

## 2024-08-17 DIAGNOSIS — R1032 Left lower quadrant pain: Secondary | ICD-10-CM | POA: Diagnosis not present

## 2024-08-17 NOTE — Telephone Encounter (Signed)
 I reviewed her chart.  CT scan looks good and that there is no obvious stone or evidence of diverticulitis.  It sounds like she did not have any urinary symptoms, but her UA was equivocal so UTI is still possible.  I think she typically does have some urine symptoms.  When she got to the triage nurse it also sounds like it could be musculoskeletal.  If it is worse with movement and better with rest or goes away with rest it could be muscular skeletal.  Unfortunately it is very difficult for me to tell without seeing her.

## 2024-08-17 NOTE — Telephone Encounter (Signed)
 Spoke with patient today.

## 2024-08-17 NOTE — Telephone Encounter (Signed)
 FYI Only or Action Required?: Action required by provider: Additional testing and medications for pain.  Patient was last seen in primary care on 08/16/2024 by Norleen Lynwood ORN, MD.  Called Nurse Triage reporting Abdominal Pain.  Symptoms began several days ago.  Interventions attempted: Nothing.  Symptoms are: gradually worsening.  Triage Disposition: See Physician Within 24 Hours  Patient/caregiver understands and will follow disposition?: No, wishes to speak with PCP       Copied from CRM #[id number]. Topic: Clinical - Red Word Triage >> Aug 17, 2024 12:26 PM Carlyon D wrote: Red Word that prompted transfer to Nurse Triage: Continuous  Abdominal Pain.  Level 5 out of 10 Reason for Disposition  [1] MODERATE pain (e.g., interferes with normal activities) AND [2] pain comes and goes (cramps) AND [3] present > 24 hours  (Exception: Pain with Vomiting or Diarrhea - see that Guideline.)    Patient states the pain is constant.  Answer Assessment - Initial Assessment Questions Patient requesting additional testing and is inquiring about what is casuing pain. Patient offered appointment in office and refused since she was seen in office yesterday. Patient requesting information on what can help with pain symptoms. Patient would like a call back regarding her request.      1. LOCATION: Where does it hurt?      Moves around her L side of her stomach  2. RADIATION: Does the pain shoot anywhere else? (e.g., chest, back)     Denies  3. ONSET: When did the pain begin? (e.g., minutes, hours or days ago)      Monday  4. SUDDEN: Gradual or sudden onset?     Sudden  5. PATTERN Does the pain come and go, or is it constant?     Constant  6. SEVERITY: How bad is the pain?  (e.g., Scale 1-10; mild, moderate, or severe)     5/10 7. RECURRENT SYMPTOM: Have you ever had this type of stomach pain before? If Yes, ask: When was the last time? and What happened that time?      Has not  had this before  8. CAUSE: What do you think is causing the stomach pain? (e.g., gallstones, recent abdominal surgery)     Unsure  9. RELIEVING/AGGRAVATING FACTORS: What makes it better or worse? (e.g., antacids, bending or twisting motion, bowel movement)     Sitting or lying can cause some relief  10. OTHER SYMPTOMS: Do you have any other symptoms? (e.g., back pain, diarrhea, fever, urination pain, vomiting)       Denies  Protocols used: Abdominal Pain - Female-A-AH

## 2024-08-18 ENCOUNTER — Ambulatory Visit: Admitting: Family Medicine

## 2024-08-18 ENCOUNTER — Other Ambulatory Visit: Payer: Self-pay | Admitting: Internal Medicine

## 2024-08-18 ENCOUNTER — Encounter: Payer: Self-pay | Admitting: Family Medicine

## 2024-08-18 VITALS — BP 146/80 | HR 61 | Temp 98.4°F | Ht 63.0 in | Wt 154.0 lb

## 2024-08-18 DIAGNOSIS — R350 Frequency of micturition: Secondary | ICD-10-CM | POA: Diagnosis not present

## 2024-08-18 DIAGNOSIS — K5792 Diverticulitis of intestine, part unspecified, without perforation or abscess without bleeding: Secondary | ICD-10-CM

## 2024-08-18 DIAGNOSIS — Z8719 Personal history of other diseases of the digestive system: Secondary | ICD-10-CM | POA: Insufficient documentation

## 2024-08-18 MED ORDER — AMOXICILLIN-POT CLAVULANATE 875-125 MG PO TABS: 1.0000 | ORAL_TABLET | Freq: Two times a day (BID) | ORAL | 0 refills | Status: AC

## 2024-08-18 NOTE — Patient Instructions (Signed)
 I have sent in Augmentin  for you to take twice a day for 10 days. This medication can upset your stomach, so I tell everyone to take it with a meal.  I would have you take hyoscyamine  as needed for the pain.   Your urine was concerning for infection in the office today. We have sent it to the lab for culture and confirmation.  Follow-up with me for new or worsening symptoms.

## 2024-08-18 NOTE — Progress Notes (Signed)
 Acute Office Visit  Subjective:     Patient ID: Hannah Bryant, female    DOB: [birth year], 78 y.o.   MRN: [id number]  No chief complaint on file.   HPI  Discussed the use of AI scribe software for clinical note transcription with the patient, who gave verbal consent to proceed.  History of Present Illness Hannah Bryant is a 78 year old female with a history of tongue cancer who presents with abdominal pain.  Abdominal pain - Onset Monday - Migratory pain, moving to different areas of the abdomen and not consistently in the same location - Pain location changes between night and morning; tracks pain movement by marking areas on her body - Intermittent abdominal pain for years, typically in a specific spot - Hyoscyamine  previously prescribed, provides symptom relief - Usually takes Tylenol Arthritis, two in the morning, but avoids NSAIDs - No pain medication taken today to allow for accurate assessment  Urinary symptoms - No current cloudy or dark urine - Cloudy urine earlier in the week       ROS Per HPI      Objective:    BP (!) 146/80 (BP Location: Left Arm, Patient Position: Sitting)   Pulse 61   Temp 98.4 F (36.9 C) (Temporal)   Ht 5' 3 (1.6 m)   Wt 154 lb (69.9 kg)   LMP 11/18/2000   SpO2 97%   BMI 27.28 kg/m    Physical Exam Vitals and nursing note reviewed.  Constitutional:      General: She is not in acute distress.    Appearance: Normal appearance.  HENT:     Head: Normocephalic and atraumatic.     Right Ear: External ear normal.     Left Ear: External ear normal.     Nose: Nose normal.     Mouth/Throat:     Mouth: Mucous membranes are moist.     Pharynx: Oropharynx is clear.  Eyes:     Extraocular Movements: Extraocular movements intact.     Pupils: Pupils are equal, round, and reactive to light.  Cardiovascular:     Rate and Rhythm: Normal rate and regular rhythm.     Pulses: Normal pulses.     Heart sounds: Normal  heart sounds.  Pulmonary:     Effort: Pulmonary effort is normal. No respiratory distress.     Breath sounds: Normal breath sounds. No wheezing, rhonchi or rales.  Abdominal:     General: Bowel sounds are normal. There is no distension.     Palpations: There is no mass.     Tenderness: There is abdominal tenderness. There is no guarding or rebound.     Hernia: No hernia is present.     Comments: LLQ tenderness  Musculoskeletal:        General: Normal range of motion.     Cervical back: Normal range of motion.     Right lower leg: No edema.     Left lower leg: No edema.  Lymphadenopathy:     Cervical: No cervical adenopathy.  Neurological:     General: No focal deficit present.     Mental Status: She is alert and oriented to person, place, and time.  Psychiatric:        Mood and Affect: Mood normal.        Thought Content: Thought content normal.     No results found for any visits on 08/18/24.      Assessment & Plan:  Assessment and Plan Assessment & Plan Abdominal pain due to suspected diverticulitis in the setting of diverticulosis of large intestine Intermittent abdominal pain with migratory location, associated with diverticulosis. CT scan shows no inflammation, but diverticulitis suspected due to pain nature and colonic spasms. Hyoscyamine  effective previously, indicating colonic spasms. - Resume hyoscyamine  for colonic spasms. - If symptoms do not improve in 24-48 hours, initiate treatment for diverticulitis with Augmentin  twice daily for one week.  Urinary frequency Reports clear urine currently, but had cloudy urine earlier in the week. No current symptoms of urinary tract infection. - Perform urine culture to rule out urinary tract infection.     Orders Placed This Encounter  Procedures   Urine Culture    Standing Status:   Future    Number of Occurrences:   1    Expiration Date:   09/18/2024     Meds ordered this encounter  Medications    amoxicillin -clavulanate (AUGMENTIN ) 875-125 MG tablet    Sig: Take 1 tablet by mouth 2 (two) times daily for 7 days.    Dispense:  14 tablet    Refill:  0    Return if symptoms worsen or fail to improve.  Corean LITTIE Ku, FNP

## 2024-08-19 ENCOUNTER — Encounter: Payer: Self-pay | Admitting: Family Medicine

## 2024-08-20 LAB — URINE CULTURE

## 2024-08-21 ENCOUNTER — Ambulatory Visit: Payer: Self-pay | Admitting: Family Medicine

## 2024-09-01 ENCOUNTER — Encounter: Payer: Self-pay | Admitting: Family Medicine

## 2024-09-04 ENCOUNTER — Telehealth: Admitting: Nurse Practitioner

## 2024-09-04 DIAGNOSIS — K5790 Diverticulosis of intestine, part unspecified, without perforation or abscess without bleeding: Secondary | ICD-10-CM | POA: Diagnosis not present

## 2024-09-04 MED ORDER — AMOXICILLIN-POT CLAVULANATE 875-125 MG PO TABS: 1.0000 | ORAL_TABLET | Freq: Two times a day (BID) | ORAL | 0 refills | Status: AC

## 2024-09-04 NOTE — Patient Instructions (Signed)
 HIGH FIBER FOODS AVOID RED MEAT     Apple (with skin) 1 medium apple 4.4 g  Avocado 1/2 cup 5.0 g  Banana 1 medium banana 3.1 g  Blueberries 1 cup 3.6 g  Oranges 1 orange 3.1 g  Pear (with skin) 1 medium pear 5.5 g  Prunes 1 cup (pitted) 12.4 g  Raspberries 1 cup 8.0 g  Strawberries 1/2 cup 2.0 g  Juices  Apple (unsweetened, with added ascorbic acid) 1 cup 0.5 g  Grapefruit (white, canned, sweetened) 1 cup 0.2 g  Grape (unsweetened, with added ascorbic acid) 1 cup 0.5 g  Orange (with pulp) 1 cup 0.7 g  Prune 1 cup 2.6 g  Vegetables  Cooked  Carrots 1/2 cup (sliced) 2.3 g  Corn 1/2 cup 2.0 g  Green beans 1 cup 4.0 g  Peas 1 cup 8.8 g  Potato (baked, with skin) 1 medium potato 3.8 g  Raw  Broccoli 1/2 cup 2.6 g  Celery 1 cup 2.0 g  Cucumber (with peel) 1 cucumber 1.5 g  Lettuce 1 cup (shredded) 0.5 g  Spinach 1 cup 0.7 g  Tomato 1 medium tomato 1.5 g  Legumes  Baked beans (canned, no salt added) 1/2 cup 5.6 g  Black beans (cooked) 1/2 cup 7.5 g  Chickpeas (cooked) 1/2 cup 6.2 g  Kidney beans (canned) 1/2 cup 5.7 g  Lentils (boiled) 1/2 cup 7.8 g  Lima beans (canned) 1/2 cup 6.6 g  Navy beans (cooked) 1/2 cup 9.5 g  Breads, pastas, flours  Bran muffins 1 medium muffin 5.2 g  Bread (rye) 1 slice 2.0 g  Bread (white) 1 slice 0.6 g  Bread (whole wheat) 1 slice 1.9 g  Bulgur (cooked) 1/2 cup 4.1 g  Cracker (rye wafers) 2 crackers 5.0 g  Oatmeal (cooked) 1 cup 4.0 g  Pasta and rice (cooked)  Macaroni 1 cup 2.5 g  Rice (brown) 1 cup 3.5 g  Rice (white) 1 cup 0.6 g  Spaghetti (regular) 1 cup 2.5 g  Spaghetti (whole wheat) 1 cup 5.4 g  Pearl barley (cooked) 1 cup 6.0 g  Popcorn (air popped) 3 cups 3.6 g  Breakfast cereals*  Bran flakes 3/4 cup 5.5 g  High-fiber cereals 1/2 cup 14 g  Instant oatmeal 1 pouch (43 g) 3.0 g  Other cereals 1/2 cup 3.0 to 7.5 g  Nuts  Almonds 1/2 cup 8.7 g  Peanuts 1/2 cup 7.9 g  There are 2 kinds of fiber: Soluble fiber is found  in fruits, oats, barley, beans, and peas. It can help heart health, including lowering cholesterol. Insoluble fiber is found in wheat, rye, and other grains. It can help with constipation. The exact amount of fiber in a food depends on the specific product and brand. To learn how much fiber and other nutrients are in different foods, visit the [place]  [employer] of Agriculture [employer]. * Ready-to-eat breakfast cereals have a wide range of fiber content. Types with high fiber content typically include bran (wheat or oat) and/or oat fiber. Check the nutrition label of each brand for actual fiber content. Data from: [facility]. Available at: [employer] on October 23, 2023). Graphic 47650 Version 7.0  2025 UpToDate, Inc. and/or its affiliates. All Rights Reserved.

## 2024-09-04 NOTE — Progress Notes (Signed)
 Virtual Visit Consent   Hannah Bryant, you are scheduled for a virtual visit with a [facility] provider today. Just as with appointments in the office, your consent must be obtained to participate. Your consent will be active for this visit and any virtual visit you may have with one of our providers in the next 365 days. If you have a MyChart account, a copy of this consent can be sent to you electronically.  As this is a virtual visit, video technology does not allow for your provider to perform a traditional examination. This may limit your provider's ability to fully assess your condition. If your provider identifies any concerns that need to be evaluated in person or the need to arrange testing (such as labs, EKG, etc.), we will make arrangements to do so. Although advances in technology are sophisticated, we cannot ensure that it will always work on either your end or our end. If the connection with a video visit is poor, the visit may have to be switched to a telephone visit. With either a video or telephone visit, we are not always able to ensure that we have a secure connection.  By engaging in this virtual visit, you consent to the provision of healthcare and authorize for your insurance to be billed (if applicable) for the services provided during this visit. Depending on your insurance coverage, you may receive a charge related to this service.  I need to obtain your verbal consent now. Are you willing to proceed with your visit today? Hannah Bryant has provided verbal consent on 09/04/2024 for a virtual visit (video or telephone). Hannah LELON Servant, NP  Date: 09/04/2024 11:57 AM   Virtual Visit via Video Note   I, Hannah Bryant, connected with  Hannah Bryant  ([id number], Aug 16, 1946) on 09/04/24 at 11:00 AM EDT by a video-enabled telemedicine application and verified that I am speaking with the correct person using two identifiers.  Location: Patient: Virtual Visit Location  Patient: Home Provider: Virtual Visit Location Provider: Home Office   I discussed the limitations of evaluation and management by telemedicine and the availability of in person appointments. The patient expressed understanding and agreed to proceed.    History of Present Illness: Hannah Bryant is a 78 y.o. who identifies as a female who was assigned female at birth, and is being seen today for recurrent symptoms of diverticulosis.  Hannah Bryant finished Augmentin  for diverticulosis a few weeks ago.  She continues to have left-sided pain and threw up her applesauce this morning.  Pain scale 4-5 out of 10 with standing and there is no pain with sitting down.   Problems:  Patient Active Problem List   Diagnosis Date Noted   Acute diverticulitis 08/18/2024   Urinary frequency 08/18/2024   Pure hypercholesterolemia 08/16/2024   Tongue cancer (HCC) 09/17/2023   Esophageal dysmotility 07/24/2023   Esophageal obstruction due to food impaction 06/12/2023   Neck pain, musculoskeletal 01/07/2022   Allergic rhinitis 07/25/2020   Nausea 10/22/2018   Dizziness 10/22/2018   Vitamin D  insufficiency 09/27/2018   Epigastric pain 09/15/2018   Right rotator cuff tear 05/11/2017   Cyst of joint of shoulder 03/04/2017   Left lower quadrant abdominal pain 03/25/2016   Type 2 diabetes mellitus with hypoglycemia without coma, without long-term current use of insulin  (HCC) 02/28/2016   Reactive hypoglycemia 02/28/2016   Retrocalcaneal bursitis 02/26/2016   Osteoporosis 01/09/2016   Essential hypertension, benign 01/09/2016   Achilles tendinosis 06/22/2015  Diarrhea 01/22/2015   Diverticulosis of large intestine without hemorrhage 01/22/2015   Junctional nevus of left thigh 12/23/2014   Left rotator cuff tear arthropathy 08/21/2014   Squamous cell skin cancer 10/06/2013   History of colonic polyps 05/09/2008   Gastroparesis 11/15/2007   DIVERTICULOSIS, COLON 11/15/2007   GASTROESOPHAGEAL REFLUX  DISEASE, SEVERE 05/12/2007   Esophageal dysphagia 05/12/2007   Nonspecific abnormal electrocardiogram (ECG) (EKG) 05/12/2007   Hyperlipidemia 05/07/2007    Allergies:  Allergies  Allergen Reactions   Oxycodone Hcl Nausea Only and Other (See Comments)   Tolmetin Other (See Comments)    REACTION: stomach complaints   Domperidone     Stomach cramps   Fentanyl  Nausea And Vomiting   Hydrocodone Other (See Comments)   Monistat [Miconazole Nitrate]     SEVERE BURNING   Nsaids     REACTION: stomach complaints   Codeine     nausea   Erythromycin Ethylsuccinate     gastritis   Medications:  Current Outpatient Medications:    amoxicillin -clavulanate (AUGMENTIN ) 875-125 MG tablet, Take 1 tablet by mouth 2 (two) times daily for 7 days., Disp: 14 tablet, Rfl: 0   acetaminophen (TYLENOL ARTHRITIS PAIN) 650 MG CR tablet, Take 650 mg by mouth. 2 by mouth 2 times daily, Disp: , Rfl:    aspirin 81 MG chewable tablet, Chew 81 mg by mouth daily., Disp: , Rfl:    atorvastatin  (LIPITOR) 40 MG tablet, TAKE 1 TABLET BY MOUTH EVERY DAY RETURN IN ABOUT 6 MONTHS (AROUND 07/15/2023) FOR FOLLOW UP., Disp: 90 tablet, Rfl: 0   Continuous Glucose Receiver (FREESTYLE LIBRE 3 READER) DEVI, USE AS DIRECTED, Disp: 1 each, Rfl: 0   Continuous Glucose Sensor (FREESTYLE LIBRE 3 PLUS SENSOR) MISC, INJECT 1 DEVICE INTO THE SKIN CONTINUOUS. CHANGE SENSOR EVERY 15 DAYS, Disp: 6 each, Rfl: 3   Cyanocobalamin (B-12) 1000 MCG TABS, Take 1,000 mcg by mouth., Disp: , Rfl:    denosumab  (PROLIA ) 60 MG/ML SOSY injection, Inject 60 mg into the skin every 6 (six) months., Disp: , Rfl:    esomeprazole  (NEXIUM ) 40 MG capsule, Take 1 capsule (40 mg total) by mouth daily. --- Needs office visit before further refills   , Disp: 30 capsule, Rfl: 2   fexofenadine (ALLEGRA) 180 MG tablet, Take 180 mg by mouth daily., Disp: , Rfl:    glucose blood (FREESTYLE TEST STRIPS) test strip, FreeStyle Precision Neo test strips.  Use as instructed to  check blood sugar 1 time a day as a back up for the Freestyle Libre CGM, Disp: 100 each, Rfl: 12   glucose blood test strip, Freestyle freedom test strip Use as instructed to check sugars E11.9, Disp: 50 each, Rfl: 12   hydrochlorothiazide  (MICROZIDE ) 12.5 MG capsule, TAKE 1 CAPSULE (12.5 MG TOTAL) BY MOUTH DAILY. PER MD RETURN IN ABOUT 6 MONTHS (AROUND 07/15/2023) FOR FOLLOW UP., Disp: 90 capsule, Rfl: 0   hyoscyamine  (LEVSIN  SL) 0.125 MG SL tablet, DISSOLVE 1-2 TABLETS UNDER TONGUE EVERY 4-6 HOURS AS NEEDED FOR ABDOMINAL PAIN AND CRAMPING, Disp: 300 tablet, Rfl: 0   Lancets MISC, by Does not apply route. Lifespan ultra 2 test strips and lancets, Disp: , Rfl:    methocarbamol (ROBAXIN) 500 MG tablet, Take 500 mg by mouth 3 (three) times daily as needed., Disp: , Rfl:    montelukast  (SINGULAIR ) 10 MG tablet, TAKE 1 TABLET BY MOUTH EVERY DAY AS NEEDED RETURN IN ABOUT 6 MONTHS (AROUND 07/15/2023) FOR FOLLOW UP., Disp: 90 tablet, Rfl: 0  OVER THE COUNTER MEDICATION, multistrain probiotic, Disp: , Rfl:    PRIORIX injection, , Disp: , Rfl:    tiZANidine  (ZANAFLEX ) 2 MG tablet, TAKE 1 TO 2 TABLETS (2-4 MG TOTAL) BY MOUTH AT BEDTIME AS NEEDED FOR MUSCLE SPASM, Disp: 180 tablet, Rfl: 1   VITAMIN D , CHOLECALCIFEROL, PO, Take 2,000 Units by mouth daily., Disp: , Rfl:   Observations/Objective: Patient is well-developed, well-nourished in no acute distress.  Resting comfortably at home.  Head is normocephalic, atraumatic.  No labored breathing.  Speech is clear and coherent with logical content.  Patient is alert and oriented at baseline.    Assessment and Plan: 1. Diverticulosis (Primary) - amoxicillin -clavulanate (AUGMENTIN ) 875-125 MG tablet; Take 1 tablet by mouth 2 (two) times daily for 7 days.  Dispense: 14 tablet; Refill: 0 She is aware of potential cdiff with frequent abx use and the possibility that if she is traveling and develops diverticulitis she will likely need to be seen in an emergency  room out of state.    Follow Up Instructions: I discussed the assessment and treatment plan with the patient. The patient was provided an opportunity to ask questions and all were answered. The patient agreed with the plan and demonstrated an understanding of the instructions.  A copy of instructions were sent to the patient via MyChart unless otherwise noted below.    The patient was advised to call back or seek an in-person evaluation if the symptoms worsen or if the condition fails to improve as anticipated.    Sharon Stapel W Shalae Belmonte, NP

## 2024-09-07 NOTE — Progress Notes (Unsigned)
 Subjective:    Patient ID: Hannah Bryant, female    DOB: Apr 12, 1946, 78 y.o.   MRN: [id number]      HPI Hannah Bryant is here for No chief complaint on file.  9/30 2025: Saw Dr. Norleen for left lower quadrant abdominal pain x 2 days.  No urine symptoms.  CMP, CBC normal.  08/17/2024: CT renal-no acute findings in abdomen/pelvis.  Moderate diverticulosis without active inflammation.   08/18/2024: She was seen here in the office for abdominal pain.  Pain was somewhat migratory.  She has had intermittent abdominal pain for years, but typically in a specific spot.  Hyoscyamine  has helped previously.  Denied urine symptoms.  Pain was concerning for possible diverticulitis.  Advise resuming hyoscyamine  for colonic spasms and if symptoms did not improve in 24-48 hours to start Augmentin  twice daily for 1 week.  Urine culture done, which showed E. coli infection 10,000-49,000 CFU-pansensitive.      Medications and allergies reviewed with patient and updated if appropriate.  Current Outpatient Medications on File Prior to Visit  Medication Sig Dispense Refill   acetaminophen (TYLENOL ARTHRITIS PAIN) 650 MG CR tablet Take 650 mg by mouth. 2 by mouth 2 times daily     amoxicillin -clavulanate (AUGMENTIN ) 875-125 MG tablet Take 1 tablet by mouth 2 (two) times daily for 7 days. 14 tablet 0   aspirin 81 MG chewable tablet Chew 81 mg by mouth daily.     atorvastatin  (LIPITOR) 40 MG tablet TAKE 1 TABLET BY MOUTH EVERY DAY RETURN IN ABOUT 6 MONTHS (AROUND 07/15/2023) FOR FOLLOW UP. 90 tablet 0   Continuous Glucose Receiver (FREESTYLE LIBRE 3 READER) DEVI USE AS DIRECTED 1 each 0   Continuous Glucose Sensor (FREESTYLE LIBRE 3 PLUS SENSOR) MISC INJECT 1 DEVICE INTO THE SKIN CONTINUOUS. CHANGE SENSOR EVERY 15 DAYS 6 each 3   Cyanocobalamin (B-12) 1000 MCG TABS Take 1,000 mcg by mouth.     denosumab  (PROLIA ) 60 MG/ML SOSY injection Inject 60 mg into the skin every 6 (six) months.     esomeprazole  (NEXIUM ) 40  MG capsule Take 1 capsule (40 mg total) by mouth daily. --- Needs office visit before further refills    30 capsule 2   fexofenadine (ALLEGRA) 180 MG tablet Take 180 mg by mouth daily.     glucose blood (FREESTYLE TEST STRIPS) test strip FreeStyle Precision Neo test strips.  Use as instructed to check blood sugar 1 time a day as a back up for the Freestyle Libre CGM 100 each 12   glucose blood test strip Freestyle freedom test strip Use as instructed to check sugars E11.9 50 each 12   hydrochlorothiazide  (MICROZIDE ) 12.5 MG capsule TAKE 1 CAPSULE (12.5 MG TOTAL) BY MOUTH DAILY. PER MD RETURN IN ABOUT 6 MONTHS (AROUND 07/15/2023) FOR FOLLOW UP. 90 capsule 0   hyoscyamine  (LEVSIN  SL) 0.125 MG SL tablet DISSOLVE 1-2 TABLETS UNDER TONGUE EVERY 4-6 HOURS AS NEEDED FOR ABDOMINAL PAIN AND CRAMPING 300 tablet 0   Lancets MISC by Does not apply route. Lifespan ultra 2 test strips and lancets     methocarbamol (ROBAXIN) 500 MG tablet Take 500 mg by mouth 3 (three) times daily as needed.     montelukast  (SINGULAIR ) 10 MG tablet TAKE 1 TABLET BY MOUTH EVERY DAY AS NEEDED RETURN IN ABOUT 6 MONTHS (AROUND 07/15/2023) FOR FOLLOW UP. 90 tablet 0   OVER THE COUNTER MEDICATION multistrain probiotic     PRIORIX injection      tiZANidine  (ZANAFLEX )  2 MG tablet TAKE 1 TO 2 TABLETS (2-4 MG TOTAL) BY MOUTH AT BEDTIME AS NEEDED FOR MUSCLE SPASM 180 tablet 1   VITAMIN D , CHOLECALCIFEROL, PO Take 2,000 Units by mouth daily.     No current facility-administered medications on file prior to visit.    Review of Systems     Objective:  There were no vitals filed for this visit. BP Readings from Last 3 Encounters:  08/18/24 (!) 146/80  08/16/24 122/80  06/07/24 (!) 144/70   Wt Readings from Last 3 Encounters:  08/18/24 154 lb (69.9 kg)  08/16/24 154 lb (69.9 kg)  06/07/24 154 lb 3.2 oz (69.9 kg)   There is no height or weight on file to calculate BMI.    Physical Exam         Assessment & Plan:    See  Problem List for Assessment and Plan of chronic medical problems.

## 2024-09-08 ENCOUNTER — Encounter: Payer: Self-pay | Admitting: Internal Medicine

## 2024-09-08 ENCOUNTER — Ambulatory Visit (INDEPENDENT_AMBULATORY_CARE_PROVIDER_SITE_OTHER): Admitting: Internal Medicine

## 2024-09-08 VITALS — BP 140/80 | HR 67 | Temp 98.4°F | Ht 63.0 in | Wt 151.0 lb

## 2024-09-08 DIAGNOSIS — R1032 Left lower quadrant pain: Secondary | ICD-10-CM

## 2024-09-08 DIAGNOSIS — G8929 Other chronic pain: Secondary | ICD-10-CM | POA: Diagnosis not present

## 2024-09-08 NOTE — Assessment & Plan Note (Addendum)
 Chronic with acute worsening of pain CT renal neg for stone, diverticulosis U cx was positive for UTI - treated with Augmetin x 7 days - improvement but still had pain and was rx'd another 7 days -- improvement - has 2 days left, but still with pain UTI could have been contributing some to the pain but I think ultimately this may be a flare of her chronic pain Currently constipated which is also likely contributing Miralax today when she gets home Daily stool softeners - needs to avoid constipation Complete Augmentin  - has 2 days left Continue levsin  prn Consider nortriptyline or duloxetine

## 2024-09-08 NOTE — Patient Instructions (Addendum)
      Medications changes include :   complete Augmentin .  Take miralax today.  Take a stool softener daily.

## 2024-09-27 ENCOUNTER — Encounter: Payer: Self-pay | Admitting: Internal Medicine

## 2024-09-27 NOTE — Patient Instructions (Addendum)
      Medications changes include :   None    A Ct scan of your heart was ordered and someone will call you to schedule an appointment.     Return in about 6 months (around 03/28/2025) for follow up.

## 2024-09-27 NOTE — Progress Notes (Unsigned)
 Subjective:    Patient ID: Hannah Bryant, female    DOB: 04-Jul-1946, 78 y.o.   MRN: [id number]     HPI Hannah Bryant is here for follow up of her chronic medical problems.  I saw her recently for left lower quadrant pain-?  Related constipation-she has been taking MiraLAX daily and stool softeners.  Probably can skip labs-last July/2025  ?  Colonoscopy  Due for DEXA.  Last Prolia  done here 05/2024  Medications and allergies reviewed with patient and updated if appropriate.  Current Outpatient Medications on File Prior to Visit  Medication Sig Dispense Refill   acetaminophen (TYLENOL ARTHRITIS PAIN) 650 MG CR tablet Take 650 mg by mouth. 2 by mouth 2 times daily     aspirin 81 MG chewable tablet Chew 81 mg by mouth daily.     atorvastatin  (LIPITOR) 40 MG tablet TAKE 1 TABLET BY MOUTH EVERY DAY RETURN IN ABOUT 6 MONTHS (AROUND 07/15/2023) FOR FOLLOW UP. 90 tablet 0   Continuous Glucose Receiver (FREESTYLE LIBRE 3 READER) DEVI USE AS DIRECTED 1 each 0   Continuous Glucose Sensor (FREESTYLE LIBRE 3 PLUS SENSOR) MISC INJECT 1 DEVICE INTO THE SKIN CONTINUOUS. CHANGE SENSOR EVERY 15 DAYS 6 each 3   Cyanocobalamin (B-12) 1000 MCG TABS Take 1,000 mcg by mouth.     denosumab  (PROLIA ) 60 MG/ML SOSY injection Inject 60 mg into the skin every 6 (six) months.     esomeprazole  (NEXIUM ) 40 MG capsule Take 1 capsule (40 mg total) by mouth daily. --- Needs office visit before further refills    30 capsule 2   fexofenadine (ALLEGRA) 180 MG tablet Take 180 mg by mouth daily.     glucose blood (FREESTYLE TEST STRIPS) test strip FreeStyle Precision Neo test strips.  Use as instructed to check blood sugar 1 time a day as a back up for the Freestyle Libre CGM 100 each 12   glucose blood test strip Freestyle freedom test strip Use as instructed to check sugars E11.9 50 each 12   hydrochlorothiazide  (MICROZIDE ) 12.5 MG capsule TAKE 1 CAPSULE (12.5 MG TOTAL) BY MOUTH DAILY. PER MD RETURN IN ABOUT 6 MONTHS  (AROUND 07/15/2023) FOR FOLLOW UP. 90 capsule 0   hyoscyamine  (LEVSIN  SL) 0.125 MG SL tablet DISSOLVE 1-2 TABLETS UNDER TONGUE EVERY 4-6 HOURS AS NEEDED FOR ABDOMINAL PAIN AND CRAMPING 300 tablet 0   Lancets MISC by Does not apply route. Lifespan ultra 2 test strips and lancets     methocarbamol (ROBAXIN) 500 MG tablet Take 500 mg by mouth 3 (three) times daily as needed.     montelukast  (SINGULAIR ) 10 MG tablet TAKE 1 TABLET BY MOUTH EVERY DAY AS NEEDED RETURN IN ABOUT 6 MONTHS (AROUND 07/15/2023) FOR FOLLOW UP. 90 tablet 0   OVER THE COUNTER MEDICATION multistrain probiotic     PRIORIX injection      tiZANidine  (ZANAFLEX ) 2 MG tablet TAKE 1 TO 2 TABLETS (2-4 MG TOTAL) BY MOUTH AT BEDTIME AS NEEDED FOR MUSCLE SPASM 180 tablet 1   VITAMIN D , CHOLECALCIFEROL, PO Take 2,000 Units by mouth daily.     No current facility-administered medications on file prior to visit.     Review of Systems     Objective:  There were no vitals filed for this visit. BP Readings from Last 3 Encounters:  09/08/24 (!) 140/80  08/18/24 (!) 146/80  08/16/24 122/80   Wt Readings from Last 3 Encounters:  09/08/24 151 lb (68.5 kg)  08/18/24 154  lb (69.9 kg)  08/16/24 154 lb (69.9 kg)   There is no height or weight on file to calculate BMI.    Physical Exam     Lab Results  Component Value Date   WBC 7.4 08/16/2024   HGB 13.7 08/16/2024   HCT 41.1 08/16/2024   PLT 148.0 (L) 08/16/2024   GLUCOSE 111 (H) 08/16/2024   CHOL 162 06/07/2024   TRIG 167 (H) 06/07/2024   HDL 69 06/07/2024   LDLCALC 68 06/07/2024   ALT 15 08/16/2024   AST 20 08/16/2024   NA 142 08/16/2024   K 3.5 08/16/2024   CL 101 08/16/2024   CREATININE 0.71 08/16/2024   BUN 15 08/16/2024   CO2 33 (H) 08/16/2024   TSH 1.12 09/09/2019   HGBA1C 6.6 (A) 06/07/2024   MICROALBUR <0.2 06/07/2024     Assessment & Plan:    See Problem List for Assessment and Plan of chronic medical problems.

## 2024-09-27 NOTE — Assessment & Plan Note (Signed)
 Chronic Associated with hypoglycemia Following with endocrine-Dr. Trixie Lab Results  Component Value Date   HGBA1C 6.6 (A) 06/07/2024   Diet controlled-frequent hypoglycemia Management per Dr Trixie GERD diet, regular exercise encouraged EKG

## 2024-09-28 ENCOUNTER — Ambulatory Visit

## 2024-09-28 ENCOUNTER — Ambulatory Visit (INDEPENDENT_AMBULATORY_CARE_PROVIDER_SITE_OTHER): Admitting: Internal Medicine

## 2024-09-28 VITALS — BP 122/72 | HR 74 | Temp 98.2°F | Ht 62.0 in | Wt 151.0 lb

## 2024-09-28 VITALS — BP 122/72 | HR 74 | Ht 62.0 in | Wt 151.0 lb

## 2024-09-28 DIAGNOSIS — M6788 Other specified disorders of synovium and tendon, other site: Secondary | ICD-10-CM | POA: Diagnosis not present

## 2024-09-28 DIAGNOSIS — J309 Allergic rhinitis, unspecified: Secondary | ICD-10-CM | POA: Diagnosis not present

## 2024-09-28 DIAGNOSIS — E559 Vitamin D deficiency, unspecified: Secondary | ICD-10-CM | POA: Diagnosis not present

## 2024-09-28 DIAGNOSIS — G8929 Other chronic pain: Secondary | ICD-10-CM

## 2024-09-28 DIAGNOSIS — M810 Age-related osteoporosis without current pathological fracture: Secondary | ICD-10-CM

## 2024-09-28 DIAGNOSIS — Z0000 Encounter for general adult medical examination without abnormal findings: Secondary | ICD-10-CM | POA: Diagnosis not present

## 2024-09-28 DIAGNOSIS — E11649 Type 2 diabetes mellitus with hypoglycemia without coma: Secondary | ICD-10-CM

## 2024-09-28 DIAGNOSIS — Z136 Encounter for screening for cardiovascular disorders: Secondary | ICD-10-CM

## 2024-09-28 DIAGNOSIS — E1169 Type 2 diabetes mellitus with other specified complication: Secondary | ICD-10-CM | POA: Diagnosis not present

## 2024-09-28 DIAGNOSIS — K219 Gastro-esophageal reflux disease without esophagitis: Secondary | ICD-10-CM | POA: Diagnosis not present

## 2024-09-28 DIAGNOSIS — R1032 Left lower quadrant pain: Secondary | ICD-10-CM | POA: Diagnosis not present

## 2024-09-28 DIAGNOSIS — E785 Hyperlipidemia, unspecified: Secondary | ICD-10-CM | POA: Diagnosis not present

## 2024-09-28 DIAGNOSIS — Z85810 Personal history of malignant neoplasm of tongue: Secondary | ICD-10-CM

## 2024-09-28 NOTE — Progress Notes (Signed)
 Chief Complaint  Patient presents with   Medicare Wellness     Subjective:   Hannah Bryant is a 78 y.o. female who presents for a Medicare Annual Wellness Visit.  Allergies (verified) Oxycodone hcl, Tolmetin, Domperidone, Fentanyl , Hydrocodone, Monistat [miconazole nitrate], Nsaids, Codeine, and Erythromycin ethylsuccinate   History: Past Medical History:  Diagnosis Date   Abnormal finding on EKG    non specific T changes; short PR   Arthritis    Bursitis    tronchanteric   Complication of anesthesia    Severe PONV   Diabetes mellitus without complication (HCC)    Pt states she is pre-diabetic (12-06-13)   Diverticulosis    Gastroparesis    93 % retenton at 2 hrs   GERD (gastroesophageal reflux disease)    H/O dilation and curettage    Hernia    hiatal   Hiatal hernia    Hyperlipidemia    Hyperlipidemia    hypoglycemia    Tubulovillous adenoma polyp of colon 06/2005   Hyperplastic 2007   Past Surgical History:  Procedure Laterality Date   CARDIAC CATHETERIZATION  2005   < 30 % lesion   COLONOSCOPY W/ POLYPECTOMY  2006 ,2007 & 2015   negative 2009; Dr Aneita   DILATATION & CURETTAGE/HYSTEROSCOPY WITH TRUECLEAR N/A 01/11/2014   Procedure: DILATATION & CURETTAGE/HYSTEROSCOPY WITH TRUCLEAR;  Surgeon: Evalene SHAUNNA Organ, MD;  Location: [facility];  Service: Gynecology;  Laterality: N/A;   ESOPHAGEAL MANOMETRY  03/22/15   Decreased peristalsis   ESOPHAGOGASTRODUODENOSCOPY (EGD) WITH PROPOFOL  N/A 06/12/2023   Procedure: ESOPHAGOGASTRODUODENOSCOPY (EGD) WITH PROPOFOL ;  Surgeon: Aneita Gwendlyn DASEN, MD;  Location: [facility];  Service: Gastroenterology;  Laterality: N/A;   Esophagram  03/29/15   Persistent narrowing at site of hiatal hernia; proximal dilation   FOREIGN BODY REMOVAL  06/12/2023   Procedure: FOREIGN BODY REMOVAL;  Surgeon: Aneita Gwendlyn DASEN, MD;  Location: [facility];  Service: Gastroenterology;;   G 3 P 3     HIATAL HERNIA REPAIR  2000   LEG SURGERY      Trauma LLE/pins and plates   myomectomy  1999   uterine fibroid   ROTATOR CUFF REPAIR Right 05/2017   TRIGGER FINGER RELEASE Right    TUBAL LIGATION     Upper endoscopy and esophageal dilation  02/20/15   Dr Lemont , [facility]   Family History  Problem Relation Age of Onset   Ulcers Father    Diabetes Father    Hypertension Father    Heart attack Father        > 77   Stroke Father        in 10s   Heart attack Mother 92   Diabetes Sister    Heart attack Sister 57       CABG X 4   Cancer Neg Hx    Colitis Neg Hx    Social History   Occupational History    Employer: UNEMPLOYED   Occupation: Retired  Tobacco Use   Smoking status: Former    Current packs/day: 0.00    Types: Cigarettes    Quit date: 11/17/1980    Years since quitting: 43.8   Smokeless tobacco: Never   Tobacco comments:    smoked 1962-1982, up to 1 ppd  Vaping Use   Vaping status: Never Used  Substance and Sexual Activity   Alcohol use: Yes    Alcohol/week: 2.0 standard drinks of alcohol    Types: 2 Glasses of wine per week  Comment: socially,2 / week   Drug use: No   Sexual activity: Yes    Birth control/protection: Post-menopausal   Tobacco Counseling Counseling given: Not Answered Tobacco comments: smoked 1962-1982, up to 1 ppd  SDOH Screenings   Food Insecurity: No Food Insecurity (09/28/2024)  Housing: Unknown (09/28/2024)  Transportation Needs: No Transportation Needs (09/28/2024)  Utilities: Not At Risk (09/28/2024)  Alcohol Screen: Low Risk  (08/25/2023)  Depression (PHQ2-9): Medium Risk (09/28/2024)  Financial Resource Strain: Low Risk  (08/25/2023)  Physical Activity: Insufficiently Active (09/28/2024)  Social Connections: Socially Integrated (09/28/2024)  Stress: No Stress Concern Present (09/28/2024)  Tobacco Use: Medium Risk (09/28/2024)  Health Literacy: Adequate Health Literacy (09/28/2024)   Depression Screen    09/28/2024    9:39 AM 08/16/2024    9:19 AM 08/25/2023    9:13 AM  01/06/2022    9:38 AM 07/25/2020    1:50 PM 11/26/2018    8:57 AM 11/25/2018   10:02 AM  PHQ 2/9 Scores  PHQ - 2 Score 3 0 1 0 0 0 0  PHQ- 9 Score 5  2          Data saved with a previous flowsheet row definition     Goals Addressed               This Visit's Progress     Patient Stated (pt-stated)        Patient stated she plans        Visit info / Clinical Intake: Medicare Wellness Visit Type:: Subsequent Annual Wellness Visit Persons participating in visit:: patient Medicare Wellness Visit Mode:: In-person (required for WTM) Information given by:: patient Interpreter Needed?: No Pre-visit prep was completed: yes AWV questionnaire completed by patient prior to visit?: no Living arrangements:: lives with spouse/significant other Patient's Overall Health Status Rating: good Typical amount of pain: none Does pain affect daily life?: no Are you currently prescribed opioids?: no  Dietary Habits and Nutritional Risks How many meals a day?: 3 Eats fruit and vegetables daily?: yes Most meals are obtained by: preparing own meals; having others provide food; eating out In the last 2 weeks, have you had any of the following?: none Diabetic:: (!) yes Any non-healing wounds?: no How often do you check your BS?: continuous glucose monitor (fasting 124) Would you like to be referred to a Nutritionist or for Diabetic Management? : no  Functional Status Activities of Daily Living (to include ambulation/medication): Independent Ambulation: Independent with device- listed below Home Assistive Devices/Equipment: Eyeglasses Medication Administration: Independent Home Management: Independent Manage your own finances?: yes Primary transportation is: driving Concerns about vision?: no *vision screening is required for WTM* Concerns about hearing?: no  Fall Screening Falls in the past year?: 0 Number of falls in past year: 0 Was there an injury with Fall?: 0 Fall Risk Category  Calculator: 0 Patient Fall Risk Level: Low Fall Risk  Fall Risk Patient at Risk for Falls Due to: No Fall Risks Fall risk Follow up: Falls prevention discussed; Falls evaluation completed  Home and Transportation Safety: All rugs have non-skid backing?: yes All stairs or steps have railings?: yes (outside) Grab bars in the bathtub or shower?: yes Have non-skid surface in bathtub or shower?: yes Good home lighting?: yes Regular seat belt use?: yes  Cognitive Assessment Difficulty concentrating, remembering, or making decisions? : no Will 6CIT or Mini Cog be Completed: yes What year is it?: 0 points What month is it?: 0 points Give patient an address phrase to remember (  5 components): [address] [place], [place] About what time is it?: 0 points Count backwards from 20 to 1: 0 points Say the months of the year in reverse: 0 points Repeat the address phrase from earlier: 0 points 6 CIT Score: 0 points  Advance Directives (For Healthcare) Does Patient Have a Medical Advance Directive?: Yes Does patient want to make changes to medical advance directive?: Yes (Inpatient - patient requests chaplain consult to change a medical advance directive) Type of Advance Directive: Healthcare Power of [place]; Living will Copy of Healthcare Power of Attorney in Chart?: No - copy requested Copy of Living Will in Chart?: No - copy requested  Reviewed/Updated  Reviewed/Updated: Reviewed All (Medical, Surgical, Family, Medications, Allergies, Care Teams, Patient Goals)        Objective:    Today's Vitals   09/28/24 0921  BP: 122/72  Pulse: 74  SpO2: 97%  Weight: 151 lb (68.5 kg)  Height: 5' 2 (1.575 m)   Body mass index is 27.62 kg/m.  Current Medications (verified) Outpatient Encounter Medications as of 09/28/2024  Medication Sig   acetaminophen (TYLENOL ARTHRITIS PAIN) 650 MG CR tablet Take 650 mg by mouth. 2 by mouth 2 times daily   aspirin 81 MG chewable tablet Chew 81 mg by  mouth daily.   atorvastatin  (LIPITOR) 40 MG tablet TAKE 1 TABLET BY MOUTH EVERY DAY RETURN IN ABOUT 6 MONTHS (AROUND 07/15/2023) FOR FOLLOW UP.   Continuous Glucose Receiver (FREESTYLE LIBRE 3 READER) DEVI USE AS DIRECTED   Continuous Glucose Sensor (FREESTYLE LIBRE 3 PLUS SENSOR) MISC INJECT 1 DEVICE INTO THE SKIN CONTINUOUS. CHANGE SENSOR EVERY 15 DAYS   Cyanocobalamin (B-12) 1000 MCG TABS Take 1,000 mcg by mouth.   denosumab  (PROLIA ) 60 MG/ML SOSY injection Inject 60 mg into the skin every 6 (six) months.   esomeprazole  (NEXIUM ) 40 MG capsule Take 1 capsule (40 mg total) by mouth daily. --- Needs office visit before further refills      fexofenadine (ALLEGRA) 180 MG tablet Take 180 mg by mouth daily.   glucose blood (FREESTYLE TEST STRIPS) test strip FreeStyle Precision Neo test strips.  Use as instructed to check blood sugar 1 time a day as a back up for the Freestyle Libre CGM   glucose blood test strip Freestyle freedom test strip Use as instructed to check sugars E11.9   hydrochlorothiazide  (MICROZIDE ) 12.5 MG capsule TAKE 1 CAPSULE (12.5 MG TOTAL) BY MOUTH DAILY. PER MD RETURN IN ABOUT 6 MONTHS (AROUND 07/15/2023) FOR FOLLOW UP.   hyoscyamine  (LEVSIN  SL) 0.125 MG SL tablet DISSOLVE 1-2 TABLETS UNDER TONGUE EVERY 4-6 HOURS AS NEEDED FOR ABDOMINAL PAIN AND CRAMPING   Lancets MISC by Does not apply route. Lifespan ultra 2 test strips and lancets   methocarbamol (ROBAXIN) 500 MG tablet Take 500 mg by mouth 3 (three) times daily as needed.   montelukast  (SINGULAIR ) 10 MG tablet TAKE 1 TABLET BY MOUTH EVERY DAY AS NEEDED RETURN IN ABOUT 6 MONTHS (AROUND 07/15/2023) FOR FOLLOW UP.   OVER THE COUNTER MEDICATION multistrain probiotic   PRIORIX injection    tiZANidine  (ZANAFLEX ) 2 MG tablet TAKE 1 TO 2 TABLETS (2-4 MG TOTAL) BY MOUTH AT BEDTIME AS NEEDED FOR MUSCLE SPASM   VITAMIN D , CHOLECALCIFEROL, PO Take 2,000 Units by mouth daily.   No facility-administered encounter medications on file as of  09/28/2024.   Hearing/Vision screen Hearing Screening - Comments:: Denies hearing difficulties   Vision Screening - Comments:: Wears rx glasses - up to date  with routine eye exams with Elspeth Aran Immunizations and Health Maintenance Health Maintenance  Topic Date Due   DEXA SCAN  01/19/2024   Colonoscopy  09/28/2025 (Originally 01/17/2017)   HEMOGLOBIN A1C  12/08/2024   COVID-19 Vaccine (9 - Pfizer risk 2025-26 season) 01/30/2025   OPHTHALMOLOGY EXAM  04/20/2025   Diabetic kidney evaluation - Urine ACR  06/07/2025   FOOT EXAM  06/07/2025   Diabetic kidney evaluation - eGFR measurement  08/16/2025   Medicare Annual Wellness (AWV)  09/28/2025   DTaP/Tdap/Td (4 - Td or Tdap) 01/11/2032   Pneumococcal Vaccine: 50+ Years  Completed   Influenza Vaccine  Completed   Hepatitis C Screening  Completed   Meningococcal B Vaccine  Aged Out   Mammogram  Discontinued   Zoster Vaccines- Shingrix  Discontinued        Assessment/Plan:  This is a routine wellness examination for Shaily.  Patient Care Team: Geofm Glade PARAS, MD as PCP - General (Internal Medicine) Fate Morna SAILOR, [facility] (Inactive) as Pharmacist (Pharmacist) Aran Elspeth PARAS, MD as Consulting Physician (Ophthalmology)  I have personally reviewed and noted the following in the patient's chart:   Medical and social history Use of alcohol, tobacco or illicit drugs  Current medications and supplements including opioid prescriptions. Functional ability and status Nutritional status Physical activity Advanced directives List of other physicians Hospitalizations, surgeries, and ER visits in previous 12 months Vitals Screenings to include cognitive, depression, and falls Referrals and appointments  Orders Placed This Encounter  Procedures   DG Bone Density    Standing Status:   Future    Expiration Date:   09/28/2025    Reason for Exam (SYMPTOM  OR DIAGNOSIS REQUIRED):   post menopausal estrogen deficient    Preferred  imaging location?:   [address]   In addition, I have reviewed and discussed with patient certain preventive protocols, quality metrics, and best practice recommendations. A written personalized care plan for preventive services as well as general preventive health recommendations were provided to patient.   Verdie CHRISTELLA Saba, CMA   09/28/2024   Return in 1 year (on 09/28/2025).  After Visit Summary: (In Person-Declined) Patient declined AVS at this time.  Nurse Notes: Scheduled 2026 AWV/CPE appts.  DEXA scan ordered.

## 2024-09-28 NOTE — Assessment & Plan Note (Signed)
 Chronic DEXA due-ordered Continue Prolia  every 6 months-last injection 05/2024 Check CMP, vitamin D  level Continue calcium  and vitamin D  daily Stressed regular walking

## 2024-09-28 NOTE — Assessment & Plan Note (Signed)
 Chronic Last vitamin D  Lab Results  Component Value Date   VD25OH 37 06/07/2024   Taking vitamin D  daily

## 2024-09-28 NOTE — Addendum Note (Signed)
 Addended by: GEOFM GLADE PARAS on: 09/28/2024 11:59 AM   Modules accepted: Level of Service

## 2024-09-28 NOTE — Assessment & Plan Note (Signed)
 Chronic Intermittent Likely related to diverticulosis Cramping at times controlled with hyoscyamine  Mild constipation can trigger pain-controlled with daily stool softeners, MiraLAX as needed Recent episode/flare resolved

## 2024-09-28 NOTE — Assessment & Plan Note (Signed)
Chronic Complaint with GERD diet GERD controlled Continue nexium 40 mg daily

## 2024-09-28 NOTE — Assessment & Plan Note (Signed)
 Chronic Improved with heel insert/lift

## 2024-09-28 NOTE — Assessment & Plan Note (Signed)
Chronic Controlled, stable Continue singulair 10 mg HS, allegra

## 2024-09-28 NOTE — Assessment & Plan Note (Addendum)
 Chronic Regular exercise and healthy diet encouraged LDL has been controlled Continue atorvastatin  40 mg daily EKG today

## 2024-09-28 NOTE — Assessment & Plan Note (Signed)
 Following with [facility] ENT-Dr. Patwa Status post surgery at [facility] with lymph node dissection No postsurgery treatment necessary Recent scans 07/2024 without evidence of disease Has follow-up every every 3-4 months

## 2024-09-28 NOTE — Patient Instructions (Addendum)
 Hannah Bryant,  Thank you for taking the time for your Medicare Wellness Visit. I appreciate your continued commitment to your health goals. Please review the care plan we discussed, and feel free to reach out if I can assist you further.  Please note that Annual Wellness Visits do not include a physical exam. Some assessments may be limited, especially if the visit was conducted virtually. If needed, we may recommend an in-person follow-up with your provider.  Ongoing Care Seeing your primary care provider every 3 to 6 months helps us  monitor your health and provide consistent, personalized care.   Referrals If a referral was made during today's visit and you haven't received any updates within two weeks, please contact the referred provider directly to check on the status.  Recommended Screenings:  Health Maintenance  Topic Date Due   Colon Cancer Screening  01/17/2017   DEXA scan (bone density measurement)  01/19/2024   Hemoglobin A1C  12/08/2024   COVID-19 Vaccine (9 - Pfizer risk 2025-26 season) 01/30/2025   Eye exam for diabetics  04/20/2025   Yearly kidney health urinalysis for diabetes  06/07/2025   Complete foot exam   06/07/2025   Yearly kidney function blood test for diabetes  08/16/2025   Medicare Annual Wellness Visit  09/28/2025   DTaP/Tdap/Td vaccine (4 - Td or Tdap) 01/11/2032   Pneumococcal Vaccine for age over 12  Completed   Flu Shot  Completed   Hepatitis C Screening  Completed   Meningitis B Vaccine  Aged Out   Breast Cancer Screening  Discontinued   Zoster (Shingles) Vaccine  Discontinued       09/28/2024    9:31 AM  Advanced Directives  Does Patient Have a Medical Advance Directive? Yes  Type of Estate Agent of [place];Living will  Does patient want to make changes to medical advance directive? Yes (Inpatient - patient requests chaplain consult to change a medical advance directive)  Copy of Healthcare Power of Attorney in Chart? No  - copy requested    Vision: Annual vision screenings are recommended for early detection of glaucoma, cataracts, and diabetic retinopathy. These exams can also reveal signs of chronic conditions such as diabetes and high blood pressure.  Dental: Annual dental screenings help detect early signs of oral cancer, gum disease, and other conditions linked to overall health, including heart disease and diabetes.

## 2024-10-09 ENCOUNTER — Encounter: Payer: Self-pay | Admitting: Internal Medicine

## 2024-10-11 ENCOUNTER — Ambulatory Visit (HOSPITAL_COMMUNITY)
Admission: RE | Admit: 2024-10-11 | Discharge: 2024-10-11 | Disposition: A | Discharge status: Home / Self Care | Source: Ambulatory Visit | Attending: Internal Medicine | Admitting: Internal Medicine

## 2024-10-11 DIAGNOSIS — Z136 Encounter for screening for cardiovascular disorders: Secondary | ICD-10-CM | POA: Insufficient documentation

## 2024-10-15 ENCOUNTER — Encounter: Payer: Self-pay | Admitting: Internal Medicine

## 2024-10-15 ENCOUNTER — Ambulatory Visit: Payer: Self-pay | Admitting: Internal Medicine

## 2024-10-15 DIAGNOSIS — I2510 Atherosclerotic heart disease of native coronary artery without angina pectoris: Secondary | ICD-10-CM | POA: Insufficient documentation

## 2024-10-18 ENCOUNTER — Emergency Department (HOSPITAL_COMMUNITY)
Admission: EM | Admit: 2024-10-18 | Discharge: 2024-10-19 | Disposition: A | Attending: Emergency Medicine | Admitting: Emergency Medicine

## 2024-10-18 ENCOUNTER — Other Ambulatory Visit: Payer: Self-pay

## 2024-10-18 ENCOUNTER — Encounter (HOSPITAL_COMMUNITY): Payer: Self-pay

## 2024-10-18 ENCOUNTER — Emergency Department (HOSPITAL_COMMUNITY)

## 2024-10-18 DIAGNOSIS — I10 Essential (primary) hypertension: Secondary | ICD-10-CM | POA: Diagnosis not present

## 2024-10-18 DIAGNOSIS — G319 Degenerative disease of nervous system, unspecified: Secondary | ICD-10-CM | POA: Diagnosis not present

## 2024-10-18 DIAGNOSIS — I2510 Atherosclerotic heart disease of native coronary artery without angina pectoris: Secondary | ICD-10-CM | POA: Insufficient documentation

## 2024-10-18 DIAGNOSIS — M542 Cervicalgia: Secondary | ICD-10-CM | POA: Insufficient documentation

## 2024-10-18 DIAGNOSIS — G8929 Other chronic pain: Secondary | ICD-10-CM | POA: Insufficient documentation

## 2024-10-18 DIAGNOSIS — E119 Type 2 diabetes mellitus without complications: Secondary | ICD-10-CM | POA: Insufficient documentation

## 2024-10-18 DIAGNOSIS — I6523 Occlusion and stenosis of bilateral carotid arteries: Secondary | ICD-10-CM | POA: Diagnosis not present

## 2024-10-18 DIAGNOSIS — J320 Chronic maxillary sinusitis: Secondary | ICD-10-CM | POA: Diagnosis not present

## 2024-10-18 DIAGNOSIS — R519 Headache, unspecified: Secondary | ICD-10-CM | POA: Insufficient documentation

## 2024-10-18 DIAGNOSIS — G500 Trigeminal neuralgia: Secondary | ICD-10-CM | POA: Insufficient documentation

## 2024-10-18 DIAGNOSIS — G9389 Other specified disorders of brain: Secondary | ICD-10-CM | POA: Diagnosis not present

## 2024-10-18 LAB — COMPREHENSIVE METABOLIC PANEL WITH GFR
ALT: 15 U/L (ref 0–44)
AST: 25 U/L (ref 15–41)
Albumin: 3.6 g/dL (ref 3.5–5.0)
Alkaline Phosphatase: 33 U/L — ABNORMAL LOW (ref 38–126)
Anion gap: 12 (ref 5–15)
BUN: 17 mg/dL (ref 8–23)
CO2: 27 mmol/L (ref 22–32)
Calcium: 9.1 mg/dL (ref 8.9–10.3)
Chloride: 101 mmol/L (ref 98–111)
Creatinine, Ser: 0.78 mg/dL (ref 0.44–1.00)
GFR, Estimated: >60 mL/min (ref >60)
Glucose, Bld: 148 mg/dL — ABNORMAL HIGH (ref 70–99)
Potassium: 3.3 mmol/L — ABNORMAL LOW (ref 3.5–5.1)
Sodium: 140 mmol/L (ref 135–145)
Total Bilirubin: 0.4 mg/dL (ref 0.0–1.2)
Total Protein: 6.4 g/dL — ABNORMAL LOW (ref 6.5–8.1)

## 2024-10-18 LAB — DIFFERENTIAL
Abs Immature Granulocytes: 0.04 K/uL (ref 0.00–0.07)
Basophils Absolute: 0 K/uL (ref 0.0–0.1)
Basophils Relative: 0 %
Eosinophils Absolute: 0.2 K/uL (ref 0.0–0.5)
Eosinophils Relative: 2 %
Immature Granulocytes: 1 %
Lymphocytes Relative: 31 %
Lymphs Abs: 2.7 K/uL (ref 0.7–4.0)
Monocytes Absolute: 0.6 K/uL (ref 0.1–1.0)
Monocytes Relative: 7 %
Neutro Abs: 5.1 K/uL (ref 1.7–7.7)
Neutrophils Relative %: 59 %

## 2024-10-18 LAB — CBC
HCT: 39.4 % (ref 36.0–46.0)
Hemoglobin: 13.2 g/dL (ref 12.0–15.0)
MCH: 32.2 pg (ref 26.0–34.0)
MCHC: 33.5 g/dL (ref 30.0–36.0)
MCV: 96.1 fL (ref 80.0–100.0)
Platelets: 161 K/uL (ref 150–400)
RBC: 4.1 MIL/uL (ref 3.87–5.11)
RDW: 13.1 % (ref 11.5–15.5)
WBC: 8.7 K/uL (ref 4.0–10.5)
nRBC: 0 % (ref 0.0–0.2)

## 2024-10-18 LAB — PROTIME-INR
INR: 1 (ref 0.8–1.2)
Prothrombin Time: 13.8 s (ref 11.4–15.2)

## 2024-10-18 LAB — APTT: aPTT: 27 s (ref 24–36)

## 2024-10-18 LAB — ETHANOL: Alcohol, Ethyl (B): <15 mg/dL (ref <15)

## 2024-10-18 MED ORDER — SODIUM CHLORIDE 0.9% FLUSH: 3.0000 mL | Freq: Once | INTRAVENOUS | Status: DC

## 2024-10-18 NOTE — ED Triage Notes (Signed)
 Patient reports sharp stabbing pain to R temple starting about an hour ago. Patient denies n/v, dizziness, numbness, tingling, endorses hx of migraines when she was younger.

## 2024-10-18 NOTE — ED Triage Notes (Signed)
 Pt c.o intermittent sharp stabbing pain to her right temple. Pain worse when she walks around. Denies hx of same.

## 2024-10-19 ENCOUNTER — Encounter: Payer: Self-pay | Admitting: Internal Medicine

## 2024-10-19 MED ORDER — GABAPENTIN 100 MG PO CAPS: 100.0000 mg | ORAL_CAPSULE | Freq: Two times a day (BID) | ORAL | 0 refills | Status: AC | PRN

## 2024-10-19 NOTE — ED Provider Notes (Signed)
 [place] [facility] Provider Note   CSN: [id number] Arrival date & time: 10/18/24  1710     History Chief Complaint  Patient presents with   Headache    HPI Hannah Bryant is a 78 y.o. female presenting for chief complaint of headache.  She is a 78 year old female with a history of chronic neck pain, chronic joint arthritis, hypertension, diabetes and esophageal issues as well as a history of CAD.  She states since earlier tonight from the hours of 4 PM until 7 PM she was having shooting pain at the angle of her right mandible and it radiated up to the right temple but did not go behind the eye.  Denies fevers chills nausea vomiting syncope shortness of breath or any visual symptoms.  Otherwise asymptomatic over the last 6 hours. No history of similar..   Patient's recorded medical, surgical, social, medication list and allergies were reviewed in the Snapshot window as part of the initial history.   Review of Systems   Review of Systems  Constitutional:  Negative for chills and fever.  HENT:  Negative for ear pain and sore throat.   Eyes:  Negative for pain and visual disturbance.  Respiratory:  Negative for cough and shortness of breath.   Cardiovascular:  Negative for chest pain and palpitations.  Gastrointestinal:  Negative for abdominal pain and vomiting.  Genitourinary:  Negative for dysuria and hematuria.  Musculoskeletal:  Negative for arthralgias and back pain.  Skin:  Negative for color change and rash.  Neurological:  Positive for headaches. Negative for seizures and syncope.  All other systems reviewed and are negative.   Physical Exam Updated Vital Signs BP (!) 149/86 (BP Location: Left Arm)   Pulse 64   Temp 97.9 F (36.6 C)   Resp (!) 21   Ht 5' 2 (1.575 m)   Wt 63.5 kg   LMP 11/18/2000   SpO2 96%   BMI 25.61 kg/m  Physical Exam Vitals and nursing note reviewed.  Constitutional:      General: She is not in  acute distress.    Appearance: She is well-developed.  HENT:     Head: Normocephalic and atraumatic.  Eyes:     Conjunctiva/sclera: Conjunctivae normal.  Cardiovascular:     Rate and Rhythm: Normal rate and regular rhythm.     Heart sounds: No murmur heard. Pulmonary:     Effort: Pulmonary effort is normal. No respiratory distress.     Breath sounds: Normal breath sounds.  Abdominal:     General: There is no distension.     Palpations: Abdomen is soft.     Tenderness: There is no abdominal tenderness. There is no right CVA tenderness or left CVA tenderness.  Musculoskeletal:        General: No swelling or tenderness. Normal range of motion.     Cervical back: Neck supple.  Skin:    General: Skin is warm and dry.  Neurological:     General: No focal deficit present.     Mental Status: She is alert and oriented to person, place, and time. Mental status is at baseline.     Cranial Nerves: No cranial nerve deficit.      ED Course/ Medical Decision Making/ A&P    Procedures Procedures   Medications Ordered in ED Medications  sodium chloride  flush (NS) 0.9 % injection 3 mL (3 mLs Intravenous Not Given 10/19/24 0202)    Medical Decision Making:   [place]  H Tillison is a 77 y.o. female who presented to the ED today with headache detailed above.    Patient placed on continuous vitals and telemetry monitoring while in ED which was reviewed periodically.   Complete initial physical exam performed, notably the patient  was HDS in NAD.    Reviewed and confirmed nursing documentation for past medical history, family history, social history.    Initial Assessment:   With the patient's presentation of headache, most likely diagnosis is tension type headaches vs atypical migraines. Other diagnoses were considered including (but not limited to) intracranial mass, intracranial hemorrhage, intracranial infection including meningitis vs encephalitis, GCA, trigeminal neuralgia. These are  considered less likely due to history of present illness and physical exam findings.    This is most consistent with an acute life/limb threatening illness complicated by underlying chronic conditions.   Timeline and slow onset is not consistent with SAH/ICH   Lack of fever,meningismus is not consistent with meningitis/encephalitis    Initial Plan:  Screening labs including CBC and Metabolic panel to evaluate for infectious or metabolic etiology of disease.  Urinalysis with reflex culture ordered to evaluate for UTI or relevant urologic/nephrologic pathology.  CTH to evaluate for structural IC etiology  Objective evaluation as below reviewed   Initial Study Results:   Laboratory  All laboratory results reviewed without evidence of clinically relevant pathology.   Radiology:  All images reviewed independently. Agree with radiology report at this time.   CT HEAD WO CONTRAST  Final Result       Final Assessment and Plan:   Giant cell arteritis does remain on the differential.  However, she is having intermittent shooting headache and has no visual symptoms making pretest probability for giant cell very low.  Discussed performing an ESR however patient does not want to be stuck again and has been here for 9 hours mostly asymptomatic would rather be discharged with treatment for trigeminal neuralgia.  She stated understanding that if she develops any visual symptoms or any persistent symptoms she would need to return to the hospital for ongoing care and management.  In the interim, will refer to neurology for outpatient workup of trigeminal neuralgia and trial gabapentin  as this is a safer option at her age.  Disposition:  I have considered need for hospitalization, however, considering all of the above, I believe this patient is stable for discharge at this time.  Patient/family educated about specific return precautions for given chief complaint and symptoms.  Patient/family educated about  follow-up with PCP/neurology.     Patient/family expressed understanding of return precautions and need for follow-up. Patient spoken to regarding all imaging and laboratory results and appropriate follow up for these results. All education provided in verbal form with additional information in written form. Time was allowed for answering of patient questions. Patient discharged.    Emergency Department Medication Summary:   Medications  sodium chloride  flush (NS) 0.9 % injection 3 mL (3 mLs Intravenous Not Given 10/19/24 0202)         Clinical Impression:  1. Acute nonintractable headache, unspecified headache type   2. Trigeminal neuralgia      Discharge   Final Clinical Impression(s) / ED Diagnoses Final diagnoses:  Acute nonintractable headache, unspecified headache type  Trigeminal neuralgia    Rx / DC Orders ED Discharge Orders          Ordered    gabapentin  (NEURONTIN ) 100 MG capsule  2 times daily PRN  10/19/24 0201              Jerral Meth, MD 10/19/24 [id number]

## 2024-10-19 NOTE — Progress Notes (Unsigned)
 Subjective:    Patient ID: Hannah Bryant, female    DOB: Dec 03, 1945, 78 y.o.   MRN: [id number]     HPI [name] is here for follow up from the emergency room.  Referral for neurology.  ED 12/2 for headache.  She has chronic neck pain.  She went to the emergency room with shooting pain from the angle of her right mandible up to the right temple that lasted for 3 hours.  The pain did not go behind her eye.  She was not having any fevers, nausea, vomiting, vision issues.  She was asymptomatic when she went to the emergency room and had not had any symptoms per over 6 hours.  Blood work including CMP, ethanol level, CBC, PT/INR, PTT were unremarkable.  CT of the head showed generalized atrophy and microvascular disease of the supratentorial brain, no acute intracranial abnormality.  Mild left maxillary sinus disease.  DDx tension headaches, atypical migraines, trigeminal neuralgia, giant cell arteritis.  Most likely thought to be related to trigeminal neuralgia.  Started on gabapentin  100 mg twice daily as needed and advised to follow-up with neurology  Discussed the use of AI scribe software for clinical note transcription with the patient, who gave verbal consent to proceed.  History of Present Illness Hannah Bryant is a 78 year old female who presents with severe, episodic temple pain.  She experiences sudden, severe pain described as 'like an ice pick shot through my temple' during activities such as decorating and cleaning. The pain is localized to her temple and is so intense that it causes shaking. Episodes occur with movement but not while sitting still, prompting her to seek emergency care.  Upon arrival at the emergency room, she experienced another severe episode while walking to the CT scan, requiring assistance from a nurse. The pain subsided when she was seated and did not recur that night. Since then, she has experienced a 'fuzziness' and a 'dullish headache' in  the same area, which is constant but not as severe as the initial episodes.  She has not taken the prescribed gabapentin  yet due to concerns about drowsiness. She denies jaw pain, blurry vision, and double vision, but reports watery eyes.  She mentions a dental procedure on Monday prior to the onset of symptoms, where she received anesthesia for a temporary crown. She wonders if this could have triggered the nerve pain, as she researched the connection between the mandible nerve and her symptoms.  She has a history of migraines in her youth but describes the current pain as different, being more of a dull, constant sensation rather than a migraine.     Medications and allergies reviewed with patient and updated if appropriate.  Current Outpatient Medications on File Prior to Visit  Medication Sig Dispense Refill   acetaminophen (TYLENOL ARTHRITIS PAIN) 650 MG CR tablet Take 650 mg by mouth. 2 by mouth 2 times daily     aspirin 81 MG chewable tablet Chew 81 mg by mouth daily.     atorvastatin  (LIPITOR) 40 MG tablet TAKE 1 TABLET BY MOUTH EVERY DAY RETURN IN ABOUT 6 MONTHS (AROUND 07/15/2023) FOR FOLLOW UP. 90 tablet 0   Continuous Glucose Receiver (FREESTYLE LIBRE 3 READER) DEVI USE AS DIRECTED 1 each 0   Continuous Glucose Sensor (FREESTYLE LIBRE 3 PLUS SENSOR) MISC INJECT 1 DEVICE INTO THE SKIN CONTINUOUS. CHANGE SENSOR EVERY 15 DAYS 6 each 3   Cyanocobalamin (B-12) 1000 MCG TABS Take 1,000 mcg  by mouth.     denosumab  (PROLIA ) 60 MG/ML SOSY injection Inject 60 mg into the skin every 6 (six) months.     esomeprazole  (NEXIUM ) 40 MG capsule Take 1 capsule (40 mg total) by mouth daily. --- Needs office visit before further refills    30 capsule 2   fexofenadine (ALLEGRA) 180 MG tablet Take 180 mg by mouth daily.     gabapentin  (NEURONTIN ) 100 MG capsule Take 1 capsule (100 mg total) by mouth 2 (two) times daily as needed. 30 capsule 0   glucose blood (FREESTYLE TEST STRIPS) test strip FreeStyle  Precision Neo test strips.  Use as instructed to check blood sugar 1 time a day as a back up for the Freestyle Libre CGM 100 each 12   glucose blood test strip Freestyle freedom test strip Use as instructed to check sugars E11.9 50 each 12   hydrochlorothiazide  (MICROZIDE ) 12.5 MG capsule TAKE 1 CAPSULE (12.5 MG TOTAL) BY MOUTH DAILY. PER MD RETURN IN ABOUT 6 MONTHS (AROUND 07/15/2023) FOR FOLLOW UP. 90 capsule 0   hyoscyamine  (LEVSIN  SL) 0.125 MG SL tablet DISSOLVE 1-2 TABLETS UNDER TONGUE EVERY 4-6 HOURS AS NEEDED FOR ABDOMINAL PAIN AND CRAMPING 300 tablet 0   Lancets MISC by Does not apply route. Lifespan ultra 2 test strips and lancets     methocarbamol (ROBAXIN) 500 MG tablet Take 500 mg by mouth 3 (three) times daily as needed.     montelukast  (SINGULAIR ) 10 MG tablet TAKE 1 TABLET BY MOUTH EVERY DAY AS NEEDED RETURN IN ABOUT 6 MONTHS (AROUND 07/15/2023) FOR FOLLOW UP. 90 tablet 0   OVER THE COUNTER MEDICATION multistrain probiotic     PRIORIX injection      tiZANidine  (ZANAFLEX ) 2 MG tablet TAKE 1 TO 2 TABLETS (2-4 MG TOTAL) BY MOUTH AT BEDTIME AS NEEDED FOR MUSCLE SPASM 180 tablet 1   VITAMIN D , CHOLECALCIFEROL, PO Take 2,000 Units by mouth daily.     No current facility-administered medications on file prior to visit.     Review of Systems     Objective:   Vitals:   10/20/24 0850  BP: 126/72  Pulse: 62  Temp: 98 F (36.7 C)  SpO2: 96%   BP Readings from Last 3 Encounters:  10/20/24 126/72  10/19/24 (!) 163/76  09/28/24 122/72   Wt Readings from Last 3 Encounters:  10/20/24 153 lb (69.4 kg)  10/18/24 140 lb (63.5 kg)  09/28/24 151 lb (68.5 kg)   Body mass index is 27.98 kg/m.    Physical Exam Constitutional:      General: She is not in acute distress.    Appearance: Normal appearance. She is not ill-appearing.  Musculoskeletal:        General: No swelling, tenderness (no tenderness right temple with palpation) or deformity.  Skin:    General: Skin is warm and  dry.     Findings: No erythema or rash.  Neurological:     General: No focal deficit present.     Mental Status: She is alert. Mental status is at baseline.     Cranial Nerves: No cranial nerve deficit.        Lab Results  Component Value Date   WBC 8.7 10/18/2024   HGB 13.2 10/18/2024   HCT 39.4 10/18/2024   PLT 161 10/18/2024   GLUCOSE 148 (H) 10/18/2024   CHOL 162 06/07/2024   TRIG 167 (H) 06/07/2024   HDL 69 06/07/2024   LDLCALC 68 06/07/2024   ALT 15  10/18/2024   AST 25 10/18/2024   NA 140 10/18/2024   K 3.3 (L) 10/18/2024   CL 101 10/18/2024   CREATININE 0.78 10/18/2024   BUN 17 10/18/2024   CO2 27 10/18/2024   TSH 1.12 09/09/2019   INR 1.0 10/18/2024   HGBA1C 6.6 (A) 06/07/2024   MICROALBUR <0.2 06/07/2024   CT HEAD WO CONTRAST CLINICAL DATA:  Headache.  EXAM: CT HEAD WITHOUT CONTRAST  TECHNIQUE: Contiguous axial images were obtained from the base of the skull through the vertex without intravenous contrast.  RADIATION DOSE REDUCTION: This exam was performed according to the departmental dose-optimization program which includes automated exposure control, adjustment of the mA and/or kV according to patient size and/or use of iterative reconstruction technique.  COMPARISON:  September 07, 2009  FINDINGS: Brain: There is generalized cerebral atrophy with widening of the extra-axial spaces and ventricular dilatation. There are areas of decreased attenuation within the white matter tracts of the supratentorial brain, consistent with microvascular disease changes.  Vascular: Marked severity bilateral cavernous carotid artery calcification is noted.  Skull: Normal. Negative for fracture or focal lesion.  Sinuses/Orbits: There is mild left maxillary sinus mucosal thickening.  Other: None.  IMPRESSION: 1. Generalized cerebral atrophy and microvascular disease changes of the supratentorial brain. 2. No acute intracranial abnormality. 3. Mild left  maxillary sinus disease.  Electronically Signed   By: Suzen Dials M.D.   On: 10/18/2024 19:49    Assessment & Plan:    See Problem List for Assessment and Plan of chronic medical problems.

## 2024-10-19 NOTE — ED Notes (Signed)
 Pt verbalized understanding of discharge instructions. Pt ambulatory at time of discharge.

## 2024-10-20 ENCOUNTER — Ambulatory Visit: Admitting: Internal Medicine

## 2024-10-20 ENCOUNTER — Ambulatory Visit: Payer: Self-pay | Admitting: Internal Medicine

## 2024-10-20 VITALS — BP 126/72 | HR 62 | Temp 98.0°F | Ht 62.0 in | Wt 153.0 lb

## 2024-10-20 DIAGNOSIS — I152 Hypertension secondary to endocrine disorders: Secondary | ICD-10-CM

## 2024-10-20 DIAGNOSIS — M792 Neuralgia and neuritis, unspecified: Secondary | ICD-10-CM | POA: Diagnosis not present

## 2024-10-20 DIAGNOSIS — G500 Trigeminal neuralgia: Secondary | ICD-10-CM | POA: Insufficient documentation

## 2024-10-20 DIAGNOSIS — E1159 Type 2 diabetes mellitus with other circulatory complications: Secondary | ICD-10-CM

## 2024-10-20 LAB — SEDIMENTATION RATE: Sed Rate: 9 mm/h (ref 0–30)

## 2024-10-20 NOTE — Patient Instructions (Addendum)
   Have blood work done downstairs    Hannah Bryant [address] [place] [address] [phone]    A referral was ordered [facility] and someone will call you to schedule an appointment.   [facility] - [phone]

## 2024-10-20 NOTE — Assessment & Plan Note (Signed)
 Chronic BP well controlled Continue hctz 12.5 mg daily

## 2024-10-20 NOTE — Assessment & Plan Note (Addendum)
 Acute Sharp, stabbing intermittent pain in the right temple that started 2 days ago.  Remained in the temple without radiation Today has a dull mild headache there and a fuzzy feeling, but no sharp pain Day before symptoms started she did have dental work on her right lower jaw involving anesthesia Prescribed gabapentin  in the ED, but has not taken it-discussed side effect of drowsiness and would only take it if pain was severe or can take it at night Continue gabapentin  100 mg twice daily as needed-I will avoid refill if needed.  We can adjust dose if tolerated Continue Tylenol for pain We will check ESR, but I do not think she has temporal arteritis Referral to neurology She will let me know if her pain changes

## 2024-10-24 ENCOUNTER — Encounter: Payer: Self-pay | Admitting: Neurology

## 2024-10-26 ENCOUNTER — Encounter: Payer: Self-pay | Admitting: Internal Medicine

## 2024-10-26 DIAGNOSIS — M47812 Spondylosis without myelopathy or radiculopathy, cervical region: Secondary | ICD-10-CM | POA: Diagnosis not present

## 2024-10-26 DIAGNOSIS — E119 Type 2 diabetes mellitus without complications: Secondary | ICD-10-CM | POA: Diagnosis not present

## 2024-10-26 DIAGNOSIS — M7662 Achilles tendinitis, left leg: Secondary | ICD-10-CM | POA: Diagnosis not present

## 2024-11-02 ENCOUNTER — Inpatient Hospital Stay: Admission: RE | Admit: 2024-11-02 | Source: Ambulatory Visit

## 2024-11-02 ENCOUNTER — Other Ambulatory Visit (HOSPITAL_COMMUNITY): Payer: Self-pay

## 2024-11-02 ENCOUNTER — Telehealth: Payer: Self-pay

## 2024-11-02 MED ORDER — PREGABALIN 25 MG PO CAPS: 25.0000 mg | ORAL_CAPSULE | Freq: Two times a day (BID) | ORAL | 2 refills | Status: DC

## 2024-11-02 MED ORDER — TRAMADOL HCL 50 MG PO TABS: 50.0000 mg | ORAL_TABLET | Freq: Three times a day (TID) | ORAL | 0 refills | Status: AC | PRN

## 2024-11-02 NOTE — Telephone Encounter (Signed)
 She is experiencing trigeminal neuralgia and not tolerating gabapentin  some assuming that will not be covered.  Please let pharmacist know so she can try the PA if not we will have to come up with another option.

## 2024-11-02 NOTE — Addendum Note (Signed)
 Addended by: GEOFM GLADE PARAS on: 11/02/2024 06:02 AM   Modules accepted: Orders

## 2024-11-02 NOTE — Addendum Note (Signed)
 Addended by: GEOFM GLADE PARAS on: 11/02/2024 05:13 AM   Modules accepted: Orders

## 2024-11-02 NOTE — Telephone Encounter (Signed)
 Pharmacy Patient Advocate Encounter   Received notification from Onbase that prior authorization for Pregabalin  25MG  capsules  is required/requested.   Insurance verification completed.   The patient is insured through [facility].   Per test claim: PA required; PA started via CoverMyMeds. KEY [id number] . Please see clinical question(s) below that I am not finding the answer to in their chart and advise.

## 2024-11-03 MED ORDER — DULOXETINE HCL 20 MG PO CPEP: 20.0000 mg | ORAL_CAPSULE | Freq: Every day | ORAL | 3 refills | Status: DC

## 2024-11-03 NOTE — Telephone Encounter (Signed)
 Clinical questions have been answered and PA submitted. PA currently Pending.

## 2024-11-03 NOTE — Telephone Encounter (Signed)
 I sent in Cymbalta  for her to try-she takes 1 pill daily.  For some people this causes little drowsiness okay to take in the evening.  She can still take the gabapentin  if she is able to tolerate it at night.

## 2024-11-03 NOTE — Telephone Encounter (Signed)
 Pharmacy Patient Advocate Encounter  Received notification from [facility] that Prior Authorization for Pregabalin  25MG  capsules has been DENIED.  Full denial letter will be uploaded to the media tab. See denial reason below.   PA #/Case ID/Reference #: [id number]

## 2024-11-03 NOTE — Addendum Note (Signed)
 Addended by: GEOFM GLADE PARAS on: 11/03/2024 04:59 PM   Modules accepted: Orders

## 2024-11-17 ENCOUNTER — Other Ambulatory Visit: Payer: Self-pay | Admitting: Family Medicine

## 2024-11-19 NOTE — Addendum Note (Signed)
 Addended by: GEOFM GLADE PARAS on: 11/19/2024 12:29 PM   Modules accepted: Orders

## 2024-11-21 ENCOUNTER — Encounter: Payer: Self-pay | Admitting: Internal Medicine

## 2024-11-21 ENCOUNTER — Ambulatory Visit: Admitting: Internal Medicine

## 2024-11-21 VITALS — BP 138/80 | HR 73 | Ht 62.0 in | Wt 149.2 lb

## 2024-11-21 DIAGNOSIS — E11649 Type 2 diabetes mellitus with hypoglycemia without coma: Secondary | ICD-10-CM | POA: Diagnosis not present

## 2024-11-21 DIAGNOSIS — E559 Vitamin D deficiency, unspecified: Secondary | ICD-10-CM

## 2024-11-21 DIAGNOSIS — E161 Other hypoglycemia: Secondary | ICD-10-CM

## 2024-11-21 LAB — POCT GLYCOSYLATED HEMOGLOBIN (HGB A1C): Hemoglobin A1C: 6.5 % — AB (ref 4.0–5.6)

## 2024-11-21 MED ORDER — DULOXETINE HCL 20 MG PO CPEP: 40.0000 mg | ORAL_CAPSULE | Freq: Every day | ORAL | 5 refills | Status: DC

## 2024-11-21 NOTE — Progress Notes (Signed)
 Patient ID: Hannah Bryant, female   DOB: 07-28-1946, 79 y.o.   MRN: [id number]   HPI: Hannah Bryant is a 79 y.o.-year-old female, presenting for f/u for DM2, dx in ~2009 (or earlier), diet-controlled, with complications (reactive hypoglycemia ever since she was very young) and osteoporosis.  Previously seen by Dr. Tommas. Last visit with her 06/2016.  Last visit with me 6 months ago.  Interim history: She continues to have lower blood sugars in the afternoon, occasionally in the 40s-50s on the CGM, but higher when checked manually.  She continues to see nutrition.   She had diverticulitis in 08/2024, then arrhythmia, then headaches-diagnosed with trigeminal neuralgia. She will see neuro in 2 mo.  Reactive hypoglycemia:  Reviewed history: Pt described that has had hypoglycemia every pm for ages. Sometimes: sweating, feeling hot, nausea, sometimes vomiting.  She may need to take a nap afterward.  She started to see Dr Lemont ([facility] >> hypoglycemic episodes = better. She had an EGD, Ba swallow test >> she had a stricture at the site of her previous hiatal hernia with food retention in the Esophagus long after she eats (procedure: Spring 2016). She had Esophageal dilation. She also had a capsule endoscopy.  Her hypoglycemia improved after esophageal stretching.  DM2:  HbA1c levels reviewed: Lab Results  Component Value Date   HGBA1C 6.6 (A) 06/07/2024   HGBA1C 6.6 (A) 03/25/2023   HGBA1C 6.5 (A) 09/16/2022  09/22/2023: HbA1c 6.6%  She has previously on diabetic medications but she stopped that she was not feeling well on them.  She would not want to restart.  She checks her sugars more than 4 times a day with her freestyle libre CGM [facility]):  Previously:  Lowest sugar was: 40s >> 40s. She has hypoglycemia awareness in the 80s. Highest sugar was 300s ...>>  200 >> 330  Glucometer: 4: OneTouch Verio and Freestyle  Meals: - B: banana + PB + muffin - L: whole wheat  bread + Provolone or Havarthy cheese + turkey or ham; chickpea puffs - D: chicken + veggies + cantaloupe - bedtime: PB  - 1 teaspoon - to keep the sugars from falling at night. She sees nutrition - started in 2016.  -No CKD, last BUN/creatinine:  Lab Results  Component Value Date   BUN 17 10/18/2024   CREATININE 0.78 10/18/2024   Lab Results  Component Value Date   MICRALBCREAT NOTE 06/07/2024   MICRALBCREAT 24 07/25/2020   MICRALBCREAT 4.4 09/06/2008   -+ HL; last set of lipids: Lab Results  Component Value Date   CHOL 162 06/07/2024   HDL 69 06/07/2024   LDLCALC 68 06/07/2024   TRIG 167 (H) 06/07/2024   CHOLHDL 2.3 06/07/2024  On Lipitor 40.  Last eye exam: 04/20/2024: No DR  Last foot exam: 06/07/2024.  She has osteopenia on DXA but with her history of humerus fracture, she has clinical osteoporosis: She has a history of a humerus fracture in 01/2017 (however, this was not seen on x-ray...).  Reviewed her previous DXA scan reports: 01/18/2021 Lumbar spine L1- L3 (L4) Femoral neck (FN)  T-score  +1.2 RFN: -2.2 LFN: -2.2  Change in BMD from previous DXA test (%)  +5.1%* -6.6%*  (*) statistically significant  02/08/2017 Lumbar spine (L1-L4) Femoral neck (FN)  T-score 0.6 RFN:-2.0 LFN:-1.6  Change in BMD from previous DXA test (%) Down 2.0% Down 4.3%    02/03/2021 Lumbar spine L1- L3 (L4) Femoral neck (FN)  T-score  +1.2  RFN: -2.2 LFN: -2.2  Change in BMD from previous DXA test (%)  +5.1%* -6.6%*  (*) statistically significant  I suggested Prolia  at previous visits and we discussed about benefits and possible side effects.  She prefered to wait until her dental implants were completed to decide about this. Now she finished dental work (sees Dr. Donnice Dickens).  She finally started this on 03/24/2022 in PCPs office. Coming u in 12/2024.  Vitamin D  insufficiency:  Reviewed vitamin D  levels: Lab Results  Component Value Date   VD25OH 37 06/07/2024   VD25OH 43.36  03/25/2023   VD25OH 26.89 (L) 01/15/2023   VD25OH 37.31 01/07/2022   VD25OH 37.68 03/20/2021   VD25OH 38.19 01/22/2021   VD25OH 31.7 07/19/2020   VD25OH 97.50 09/09/2019   VD25OH 33.78 09/27/2018   VD25OH 24.24 (L) 03/24/2018   She was on 1000 units vitamin D  daily >> stopped by PCP >> restarted 500 units daily.  Dose was subsequently increased to 2000 units daily.  She had investigation for dysphagia and esophageal food impaction.  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  She had resection (partial glossectomy) on 10/05/2023.  The cancer measured 2 mm was resected completely, but one of the 37 lymph nodes were positive for metastasis. She did not need RxTx.   ROS: + see HPI  I reviewed pt's medications, allergies, PMH, social hx, family hx, and changes were documented in the history of present illness. Otherwise, unchanged from my initial visit note.  History   Social History   Marital Status: Married    Spouse Name: N/A   Number of Children: 2   Social History Main Topics   Smoking status: Former Smoker    Types: Cigarettes    Quit date: 11/17/1980   Smokeless tobacco: Never Used     Comment: smoked 1962-1982, up to 1 ppd   Alcohol Use: 1.2 oz/week    2 Glasses of wine per week     Comment: socially,2 / week   Drug Use: No   Sexual Activity: Yes    Birth Control/ Protection: Post-menopausal   Social History Narrative   Daily caffeine use   Married   Metallurgist intermittently   Past Medical History:  Diagnosis Date   Abnormal finding on EKG    non specific T changes; short PR   Arthritis    Bursitis    tronchanteric   Complication of anesthesia    Severe PONV   Diabetes mellitus without complication (HCC)    Pt states she is pre-diabetic (12-06-13)   Diverticulosis    Gastroparesis    93 % retenton at 2 hrs   GERD (gastroesophageal reflux disease)    H/O dilation and curettage     Hernia    hiatal   Hiatal hernia    Hyperlipidemia    Hyperlipidemia    hypoglycemia    Tubulovillous adenoma polyp of colon 06/2005   Hyperplastic 2007   Past Surgical History:  Procedure Laterality Date   CARDIAC CATHETERIZATION  2005   < 30 % lesion   COLONOSCOPY W/ POLYPECTOMY  2006 ,2007 & 2015   negative 2009; Dr Aneita   DILATATION & CURETTAGE/HYSTEROSCOPY WITH TRUECLEAR N/A 01/11/2014   Procedure: DILATATION & CURETTAGE/HYSTEROSCOPY WITH TRUCLEAR;  Surgeon: Evalene SHAUNNA Organ, MD;  Location: [facility];  Service: Gynecology;  Laterality: N/A;   ESOPHAGEAL MANOMETRY  03/22/15   Decreased peristalsis   ESOPHAGOGASTRODUODENOSCOPY (EGD) WITH PROPOFOL  N/A 06/12/2023  Procedure: ESOPHAGOGASTRODUODENOSCOPY (EGD) WITH PROPOFOL ;  Surgeon: Aneita Gwendlyn DASEN, MD;  Location: [facility];  Service: Gastroenterology;  Laterality: N/A;   Esophagram  03/29/15   Persistent narrowing at site of hiatal hernia; proximal dilation   FOREIGN BODY REMOVAL  06/12/2023   Procedure: FOREIGN BODY REMOVAL;  Surgeon: Aneita Gwendlyn DASEN, MD;  Location: [facility];  Service: Gastroenterology;;   G 3 P 3     HIATAL HERNIA REPAIR  2000   LEG SURGERY     Trauma LLE/pins and plates   myomectomy  1999   uterine fibroid   ROTATOR CUFF REPAIR Right 05/2017   TRIGGER FINGER RELEASE Right    TUBAL LIGATION     Upper endoscopy and esophageal dilation  02/20/15   Dr Lemont , [facility]   Current Outpatient Medications on File Prior to Visit  Medication Sig Dispense Refill   acetaminophen (TYLENOL ARTHRITIS PAIN) 650 MG CR tablet Take 650 mg by mouth. 2 by mouth 2 times daily     aspirin 81 MG chewable tablet Chew 81 mg by mouth daily.     atorvastatin  (LIPITOR) 40 MG tablet TAKE 1 TABLET BY MOUTH EVERY DAY RETURN IN ABOUT 6 MONTHS (AROUND 07/15/2023) FOR FOLLOW UP. 90 tablet 0   Continuous Glucose Receiver (FREESTYLE LIBRE 3 READER) DEVI USE AS DIRECTED 1 each 0   Continuous Glucose Sensor (FREESTYLE LIBRE 3 PLUS SENSOR) MISC  INJECT 1 DEVICE INTO THE SKIN CONTINUOUS. CHANGE SENSOR EVERY 15 DAYS 6 each 3   Cyanocobalamin (B-12) 1000 MCG TABS Take 1,000 mcg by mouth.     denosumab  (PROLIA ) 60 MG/ML SOSY injection Inject 60 mg into the skin every 6 (six) months.     DULoxetine  (CYMBALTA ) 20 MG capsule Take 1 capsule (20 mg total) by mouth daily. 30 capsule 3   esomeprazole  (NEXIUM ) 40 MG capsule Take 1 capsule (40 mg total) by mouth daily. --- Needs office visit before further refills    30 capsule 2   fexofenadine (ALLEGRA) 180 MG tablet Take 180 mg by mouth daily.     gabapentin  (NEURONTIN ) 100 MG capsule Take 1 capsule (100 mg total) by mouth 2 (two) times daily as needed. 30 capsule 0   glucose blood (FREESTYLE TEST STRIPS) test strip FreeStyle Precision Neo test strips.  Use as instructed to check blood sugar 1 time a day as a back up for the Freestyle Libre CGM 100 each 12   glucose blood test strip Freestyle freedom test strip Use as instructed to check sugars E11.9 50 each 12   hydrochlorothiazide  (MICROZIDE ) 12.5 MG capsule TAKE 1 CAPSULE (12.5 MG TOTAL) BY MOUTH DAILY. PER MD RETURN IN ABOUT 6 MONTHS (AROUND 07/15/2023) FOR FOLLOW UP. 90 capsule 0   hyoscyamine  (LEVSIN  SL) 0.125 MG SL tablet DISSOLVE 1-2 TABLETS UNDER TONGUE EVERY 4-6 HOURS AS NEEDED FOR ABDOMINAL PAIN AND CRAMPING 300 tablet 0   Lancets MISC by Does not apply route. Lifespan ultra 2 test strips and lancets     methocarbamol (ROBAXIN) 500 MG tablet Take 500 mg by mouth 3 (three) times daily as needed.     montelukast  (SINGULAIR ) 10 MG tablet TAKE 1 TABLET BY MOUTH EVERY DAY AS NEEDED RETURN IN ABOUT 6 MONTHS (AROUND 07/15/2023) FOR FOLLOW UP. 90 tablet 0   OVER THE COUNTER MEDICATION multistrain probiotic     PRIORIX injection      tiZANidine  (ZANAFLEX ) 2 MG tablet TAKE 1 TO 2 TABLETS (2-4 MG TOTAL) BY MOUTH AT BEDTIME AS NEEDED FOR  MUSCLE SPASM 180 tablet 1   VITAMIN D , CHOLECALCIFEROL, PO Take 2,000 Units by mouth daily.     No current  facility-administered medications on file prior to visit.   Allergies  Allergen Reactions   Oxycodone Hcl Nausea Only and Other (See Comments)   Tolmetin Other (See Comments)    REACTION: stomach complaints   Domperidone     Stomach cramps   Fentanyl  Nausea And Vomiting   Hydrocodone Other (See Comments)   Monistat [Miconazole Nitrate]     SEVERE BURNING   Nsaids     REACTION: stomach complaints   Codeine     nausea   Erythromycin Ethylsuccinate     gastritis   Family History  Problem Relation Age of Onset   Ulcers Father    Diabetes Father    Hypertension Father    Heart attack Father        > 71   Stroke Father        in 73s   Heart attack Mother 31   Diabetes Sister    Heart attack Sister 38       CABG X 4   Cancer Neg Hx    Colitis Neg Hx    PE: BP 138/80   Pulse 73   Ht 5' 2 (1.575 m)   Wt 149 lb 3.2 oz (67.7 kg)   LMP 11/18/2000   SpO2 92%   BMI 27.29 kg/m   Wt Readings from Last 3 Encounters:  11/21/24 149 lb 3.2 oz (67.7 kg)  10/20/24 153 lb (69.4 kg)  10/18/24 140 lb (63.5 kg)   Constitutional: normal weight, in NAD Eyes:  EOMI, no exophthalmos ENT: no neck masses, no cervical lymphadenopathy Cardiovascular: RRR, No MRG Respiratory: CTA B Musculoskeletal: no deformities Skin:no rashes Neurological: no tremor with outstretched hands  ASSESSMENT: 1. Diet-controlled DM2, with complications - hypoglycemia  2. Hypoglycemia - possibly reactive - Possibly related to a history of gastroparesis - improved after her esophageal stretching - We ruled out adrenal insufficiency: Component     Latest Ref Rng 02/02/2015 02/02/2015 02/02/2015         9:21 AM 10:02 AM 10:02 AM  Cortisol, Plasma      7.1 29.1 24.2  C206 ACTH      6 - 50 pg/mL 12     3.  Clinical osteoporosis  4.  Vitamin D  insufficiency  PLAN:  1. DM2 and 2. History of reactive/postprandial hypoglycemia - Diet controlled - At previous visits we discussed about the guidelines not  recommending CGM monitoring for patients with reactive hypoglycemia due to increase anxiety.  I explained that an occasional drop in blood sugars under 70 may not be abnormal, especially on the CGM, which may show her readings that are 20 to 30 mg/dL lower than they actually are and also, especially since she is not feeling them.  It is likely that her body is able to correct these.  This does not mean that she should not improve her diet to avoid hyperglycemic spikes, for example eliminating concentrated sweets (discussed about examples), since these decrease in glucose levels appear to happen after an initial hyperglycemic peak.  However, she appears that she is usually trying to chase the low blood sugars throughout the day and this appears to be decreasing her quality of life.  She mentions that she is not going out to eat at night if her sugars are low. - At previous visits I did not suggest therapeutic intervention (there is no approved  treatment available for this condition, rather, dietary changes are necessary).  I did recommend having carbs with her just in case she needed to correct a low blood sugar.  I did advise her to try not to correct the blood sugars immediately but allow the body to correct them.  She did notice that allowing the blood sugars to drop a little bit more before correcting them actually helped.  She is currently not fighting with the blood sugars as much as before.  She was still correcting blood sugars lower than 80 and we discussed about trying to correct only if lower than 70s or 60s.  I also recommended to avoid high glycemic index foods and include healthy fats with meals to avoid blood sugar fluctuations.  Afterwards, sugars started to fluctuate mainly within the target range but she had some lower blood sugars in the afternoon and early evening.  These appeared after an initial peaking blood sugar and I suspected that she was trying to correct normal blood sugar with high  glycemic index carbs.  However, she was very upset about the blood sugar fluctuations and we discussed that we could try acarbose  taken before lunch and maybe 1 or both of the other meals, depending on the effect.  I discussed with her about the mechanism of action and possible side effects (GI).  She and her husband discussed about this but she opted not to try it as she was afraid that this would slow down her GI tract.  We discussed that this was used to block the absorption of carbs, but did not slow the gastric emptying down.  - She and her husband also also discussed that she may need a second opinion at [facility], however, they did not go through with this. -At last visit, sugars appears to be quite fluctuating within the relatively narrow range in the target interval, very stable overnight but with more variability later in the day.  She still had instances in which her sugars were dropping down to the 40s on the CGM, and she was correcting well, but they were dropping again afterwards.  We discussed about eating a snack with protein and fat after correction of allowed to hold her blood sugars up.  Otherwise, she was doing a good job adjusting her meals to include more fat and protein per her nutritionist's advice.  She was feeling poorly when the sugars were dropping too low, with mental fog and dizziness.  She was trying to prevent her blood sugars from dropping before she was developing the symptoms.  We discussed that the sensor blood sugars can be more alarming than fingersticks but she wanted to continue using the sensor.  She was checking her blood sugars manually when she was getting a little on the CGM, which I advised. CGM interpretation: -At today's visit, we reviewed her CGM downloads: It appears that 96% of values are in target range (goal >70%), while 4% are higher than 180 (goal <25%), and 0% are lower than 70 (goal <4%).  The calculated average blood sugar is 130.  The projected HbA1c  for the next 3 months (GMI) is 6.4%. -Reviewing the CGM trends, sugars appear to be fairly well-controlled, with only rare hyperglycemic spikes above 180, but with 1 more significant spike in the 370s.  She does not remember exactly why this happened.  However, overall, since the sugars remain in goal and her HbA1c is still excellent, I do not feel we need to add any diabetic  medication.  She mentions that she was occasionally dropping her blood sugars too low at night (no compression lows), but this resolved after she started to take 1 teaspoon of peanut butter at bedtime. - we checked her HbA1c: 6.5% (lower) - advised to check sugars at different times of the day - 4x a day, rotating check times - advised for yearly eye exams >> she is UTD - return to clinic in 6 months  3.  Clinical osteoporosis (history of fractures in the setting of osteopenia) - This is managed by PCP - Likely age-related + postmenopausal - No falls or fractures since last visit - She does qualify for pharmaceutical treatment as she has clinical osteoporosis (osteopenia on the bone density scan + history of humeral fracture) - Prolia  was started 03/2022.  She gets this through PCPs office.  No side effects.  However, she previously mentioned that she did not particularly like the medication and regretted starting it - Dr. Geofm ordered another DXA scan 09/2024 -she plans to have this soon.  4.  Vitamin D  insufficiency -She had a high normal vitamin D  in the past, at 97.5, so we stopped her vitamin D  supplement at that time but then restarted 2000 units daily. -Latest vitamin D  level was normal: Lab Results  Component Value Date   VD25OH 37 06/07/2024  - Will continue to keep an eye on this-plan to recheck it at next visit  Lela Fendt, MD PhD [facility] Endocrinology

## 2024-11-21 NOTE — Addendum Note (Signed)
 Addended by: GEOFM GLADE PARAS on: 11/21/2024 07:55 AM   Modules accepted: Orders

## 2024-11-21 NOTE — Addendum Note (Signed)
 Addended by: CLEOTILDE ROLIN RAMAN on: 11/21/2024 10:58 AM   Modules accepted: Orders

## 2024-11-21 NOTE — Patient Instructions (Signed)
Please come back for a follow-up appointment in 6 months.   

## 2024-11-22 ENCOUNTER — Telehealth: Payer: Self-pay

## 2024-11-22 NOTE — Telephone Encounter (Signed)
 Copied from CRM #[id number]. Topic: General - Other >> Nov 22, 2024 10:34 AM Eva FALCON wrote: Reason for CRM: Dr. Adina Dickens from [facility] was calling to give Dr. Geofm a message. He has been doing dental work for this patient and experience severe pain over the holidays. Went to ER and was diagnosed with Trigeminal neuralgia. Dr. Dickens is requesting a call back from Dr. Geofm to discuss further per patient request. States you can reach him at his ofice [phone] or if more convenient he provided his cell phone number [phone] and he said its okay to leave a vm with a good time to speak.

## 2024-11-24 NOTE — Telephone Encounter (Signed)
Called yesterday and left a message

## 2024-12-02 ENCOUNTER — Telehealth: Payer: Self-pay | Admitting: Internal Medicine

## 2024-12-02 NOTE — Telephone Encounter (Signed)
 I did see that oncology ordered the MRI.

## 2024-12-02 NOTE — Telephone Encounter (Signed)
 Can you please call her and see how she is doing with her headaches/nerve pain.  See if the medications are working well enough.  Her dentist-Matt Pasco called and we did speak.  Ideally it would be great if we can get her in to see neurology sooner-the only option may be to put a referral in for neurology at [facility], [facility] or [place].

## 2024-12-03 ENCOUNTER — Other Ambulatory Visit: Payer: Self-pay | Admitting: Internal Medicine

## 2024-12-04 ENCOUNTER — Encounter: Payer: Self-pay | Admitting: Internal Medicine

## 2024-12-04 DIAGNOSIS — G500 Trigeminal neuralgia: Secondary | ICD-10-CM

## 2024-12-09 ENCOUNTER — Telehealth: Payer: Self-pay

## 2024-12-09 ENCOUNTER — Other Ambulatory Visit: Payer: Self-pay

## 2024-12-09 DIAGNOSIS — M810 Age-related osteoporosis without current pathological fracture: Secondary | ICD-10-CM

## 2024-12-09 MED ORDER — DENOSUMAB 60 MG/ML ~~LOC~~ SOSY: 60.0000 mg | PREFILLED_SYRINGE | Freq: Once | SUBCUTANEOUS | Status: AC

## 2024-12-09 NOTE — Telephone Encounter (Signed)
 Prolia VOB initiated via [email or web address]  Next Prolia inj DUE: NOW

## 2024-12-12 ENCOUNTER — Other Ambulatory Visit (HOSPITAL_COMMUNITY): Payer: Self-pay

## 2024-12-12 NOTE — Telephone Encounter (Signed)
 Pt ready for scheduling for PROLIA  on or after : 12/12/24  Option# 1: Buy/Bill (Office supplied medication)  Out-of-pocket cost due at time of clinic visit: $0  Number of injection/visits approved: ---  Primary: MEDICARE Prolia  co-insurance: 0% Admin fee co-insurance: 0%  Secondary: [employer] [place] Prolia  co-insurance:  Admin fee co-insurance:   Medical Benefit Details: Date Benefits were checked: 12/09/14 Deductible: $283 Met of $283 Required/ Coinsurance: 0%/ Admin Fee: 0%  Prior Auth: N/A PA# Expiration Date:   # of doses approved: ----------------------------------------------------------------------- Option# 2- Med Obtained from pharmacy:  Pharmacy benefit: Copay $--- (Paid to pharmacy) Admin Fee: --- (Pay at clinic)  Prior Auth: N/A PA# Expiration Date:   # of doses approved:   If patient wants fill through the pharmacy benefit please send prescription to: [facility], and include estimated need by date in rx notes. Pharmacy will ship medication directly to the office.  Patient NOT eligible for Prolia  Copay Card. Copay Card can make patient's cost as little as $25. Link to apply: [email or web address]://[email or web address]  ** This summary of benefits is an estimation of the patient's out-of-pocket cost. Exact cost may very based on individual plan coverage. '

## 2024-12-12 NOTE — Telephone Encounter (Signed)
 SABRA

## 2024-12-14 ENCOUNTER — Other Ambulatory Visit: Payer: Self-pay | Admitting: Internal Medicine

## 2024-12-19 ENCOUNTER — Ambulatory Visit

## 2024-12-20 ENCOUNTER — Encounter: Payer: Self-pay | Admitting: *Deleted

## 2024-12-20 ENCOUNTER — Encounter: Payer: Self-pay | Admitting: Internal Medicine

## 2024-12-30 ENCOUNTER — Ambulatory Visit: Admitting: Cardiovascular Disease

## 2025-01-27 ENCOUNTER — Ambulatory Visit: Admitting: Neurology

## 2025-06-12 ENCOUNTER — Ambulatory Visit: Admitting: Internal Medicine

## 2025-10-10 ENCOUNTER — Ambulatory Visit

## 2025-10-23 ENCOUNTER — Encounter: Admitting: Internal Medicine

## 2025-10-23 ENCOUNTER — Ambulatory Visit
